# Patient Record
Sex: Female | Born: 1935 | Race: Black or African American | Hispanic: No | Smoking: Never smoker
Health system: Southern US, Community
[De-identification: ages and names within clinical notes are randomized; demographics above are authoritative.]

## PROBLEM LIST (undated history)

## (undated) DIAGNOSIS — M069 Rheumatoid arthritis, unspecified: Secondary | ICD-10-CM

## (undated) DIAGNOSIS — M1990 Unspecified osteoarthritis, unspecified site: Secondary | ICD-10-CM

## (undated) DIAGNOSIS — N183 Chronic kidney disease, stage 3 unspecified: Secondary | ICD-10-CM

## (undated) DIAGNOSIS — I428 Other cardiomyopathies: Secondary | ICD-10-CM

## (undated) DIAGNOSIS — L089 Local infection of the skin and subcutaneous tissue, unspecified: Secondary | ICD-10-CM

## (undated) DIAGNOSIS — I5042 Chronic combined systolic (congestive) and diastolic (congestive) heart failure: Secondary | ICD-10-CM

## (undated) DIAGNOSIS — E785 Hyperlipidemia, unspecified: Secondary | ICD-10-CM

## (undated) DIAGNOSIS — Z9289 Personal history of other medical treatment: Secondary | ICD-10-CM

## (undated) DIAGNOSIS — I639 Cerebral infarction, unspecified: Secondary | ICD-10-CM

## (undated) DIAGNOSIS — E119 Type 2 diabetes mellitus without complications: Secondary | ICD-10-CM

## (undated) DIAGNOSIS — I119 Hypertensive heart disease without heart failure: Secondary | ICD-10-CM

## (undated) DIAGNOSIS — I480 Paroxysmal atrial fibrillation: Secondary | ICD-10-CM

## (undated) DIAGNOSIS — J849 Interstitial pulmonary disease, unspecified: Secondary | ICD-10-CM

## (undated) DIAGNOSIS — E039 Hypothyroidism, unspecified: Secondary | ICD-10-CM

## (undated) DIAGNOSIS — T148XXA Other injury of unspecified body region, initial encounter: Secondary | ICD-10-CM

## (undated) DIAGNOSIS — Z8719 Personal history of other diseases of the digestive system: Secondary | ICD-10-CM

## (undated) DIAGNOSIS — I340 Nonrheumatic mitral (valve) insufficiency: Secondary | ICD-10-CM

## (undated) HISTORY — PX: APPENDECTOMY: SHX54

## (undated) HISTORY — DX: Hyperlipidemia, unspecified: E78.5

## (undated) HISTORY — PX: MEDIAL PARTIAL KNEE REPLACEMENT: SHX5965

## (undated) HISTORY — DX: Hypothyroidism, unspecified: E03.9

## (undated) HISTORY — DX: Personal history of other diseases of the digestive system: Z87.19

## (undated) HISTORY — PX: TUBAL LIGATION: SHX77

## (undated) HISTORY — DX: Unspecified osteoarthritis, unspecified site: M19.90

## (undated) HISTORY — PX: KNEE ARTHROSCOPY: SHX127

## (undated) HISTORY — DX: Hypertensive heart disease without heart failure: I11.9

## (undated) HISTORY — DX: Other cardiomyopathies: I42.8

## (undated) HISTORY — PX: PARTIAL COLECTOMY: SHX5273

## (undated) HISTORY — DX: Nonrheumatic mitral (valve) insufficiency: I34.0

## (undated) HISTORY — DX: Paroxysmal atrial fibrillation: I48.0

## (undated) HISTORY — DX: Local infection of the skin and subcutaneous tissue, unspecified: L08.9

## (undated) HISTORY — PX: CATARACT EXTRACTION W/ INTRAOCULAR LENS IMPLANT: SHX1309

## (undated) HISTORY — DX: Other injury of unspecified body region, initial encounter: T14.8XXA

## (undated) HISTORY — DX: Rheumatoid arthritis, unspecified: M06.9

## (undated) HISTORY — PX: TOTAL KNEE ARTHROPLASTY: SHX125

---

## 1998-04-15 ENCOUNTER — Other Ambulatory Visit: Admission: RE | Admit: 1998-04-15 | Discharge: 1998-04-15 | Payer: Self-pay | Admitting: Internal Medicine

## 1998-09-11 DIAGNOSIS — I639 Cerebral infarction, unspecified: Secondary | ICD-10-CM

## 1998-09-11 HISTORY — DX: Cerebral infarction, unspecified: I63.9

## 1999-07-14 ENCOUNTER — Ambulatory Visit (HOSPITAL_COMMUNITY): Admission: RE | Admit: 1999-07-14 | Discharge: 1999-07-14 | Payer: Self-pay | Admitting: Internal Medicine

## 1999-07-14 ENCOUNTER — Encounter: Payer: Self-pay | Admitting: Internal Medicine

## 1999-10-31 ENCOUNTER — Ambulatory Visit (HOSPITAL_COMMUNITY): Admission: RE | Admit: 1999-10-31 | Discharge: 1999-10-31 | Payer: Self-pay | Admitting: Cardiovascular Disease

## 1999-12-29 ENCOUNTER — Other Ambulatory Visit: Admission: RE | Admit: 1999-12-29 | Discharge: 1999-12-29 | Payer: Self-pay | Admitting: Internal Medicine

## 1999-12-30 ENCOUNTER — Ambulatory Visit (HOSPITAL_COMMUNITY): Admission: RE | Admit: 1999-12-30 | Discharge: 1999-12-30 | Payer: Self-pay | Admitting: Internal Medicine

## 2004-11-23 ENCOUNTER — Encounter: Admission: RE | Admit: 2004-11-23 | Discharge: 2004-11-23 | Payer: Self-pay | Admitting: Internal Medicine

## 2004-12-22 ENCOUNTER — Other Ambulatory Visit: Admission: RE | Admit: 2004-12-22 | Discharge: 2004-12-22 | Payer: Self-pay | Admitting: Internal Medicine

## 2004-12-23 ENCOUNTER — Inpatient Hospital Stay (HOSPITAL_COMMUNITY): Admission: AD | Admit: 2004-12-23 | Discharge: 2004-12-30 | Payer: Self-pay | Admitting: Internal Medicine

## 2005-01-31 ENCOUNTER — Inpatient Hospital Stay (HOSPITAL_COMMUNITY): Admission: RE | Admit: 2005-01-31 | Discharge: 2005-02-09 | Payer: Self-pay

## 2005-01-31 ENCOUNTER — Emergency Department (HOSPITAL_COMMUNITY): Admission: EM | Admit: 2005-01-31 | Discharge: 2005-01-31 | Payer: Self-pay | Admitting: *Deleted

## 2011-01-27 ENCOUNTER — Ambulatory Visit
Admission: RE | Admit: 2011-01-27 | Discharge: 2011-01-27 | Disposition: A | Discharge status: Home / Self Care | Payer: Medicare Other | Source: Ambulatory Visit | Attending: Internal Medicine | Admitting: Internal Medicine

## 2011-01-27 ENCOUNTER — Emergency Department (HOSPITAL_COMMUNITY): Payer: Medicare Other

## 2011-01-27 ENCOUNTER — Other Ambulatory Visit: Payer: Self-pay | Admitting: Internal Medicine

## 2011-01-27 ENCOUNTER — Emergency Department (HOSPITAL_COMMUNITY)
Admission: EM | Admit: 2011-01-27 | Discharge: 2011-01-27 | Disposition: A | Discharge status: Home / Self Care | Payer: Medicare Other | Attending: Emergency Medicine | Admitting: Emergency Medicine

## 2011-01-27 DIAGNOSIS — I10 Essential (primary) hypertension: Secondary | ICD-10-CM | POA: Insufficient documentation

## 2011-01-27 DIAGNOSIS — E119 Type 2 diabetes mellitus without complications: Secondary | ICD-10-CM | POA: Insufficient documentation

## 2011-01-27 DIAGNOSIS — N83209 Unspecified ovarian cyst, unspecified side: Secondary | ICD-10-CM | POA: Insufficient documentation

## 2011-01-27 DIAGNOSIS — E7800 Pure hypercholesterolemia, unspecified: Secondary | ICD-10-CM | POA: Insufficient documentation

## 2011-01-27 DIAGNOSIS — E039 Hypothyroidism, unspecified: Secondary | ICD-10-CM | POA: Insufficient documentation

## 2011-01-27 DIAGNOSIS — R319 Hematuria, unspecified: Secondary | ICD-10-CM | POA: Insufficient documentation

## 2011-01-27 DIAGNOSIS — R300 Dysuria: Secondary | ICD-10-CM | POA: Insufficient documentation

## 2011-01-27 DIAGNOSIS — N898 Other specified noninflammatory disorders of vagina: Secondary | ICD-10-CM | POA: Insufficient documentation

## 2011-01-27 LAB — URINALYSIS, ROUTINE W REFLEX MICROSCOPIC
Bilirubin Urine: NEGATIVE
Glucose, UA: NEGATIVE mg/dL
Ketones, ur: 15 mg/dL — AB
Leukocytes, UA: NEGATIVE
Specific Gravity, Urine: 1.023 (ref 1.005–1.030)
pH: 5 (ref 5.0–8.0)

## 2011-01-27 LAB — BASIC METABOLIC PANEL
BUN: 18 mg/dL (ref 6–23)
Chloride: 102 mEq/L (ref 96–112)
Glucose, Bld: 82 mg/dL (ref 70–99)
Potassium: 3.6 mEq/L (ref 3.5–5.1)
Sodium: 139 mEq/L (ref 135–145)

## 2011-01-27 LAB — CBC
HCT: 42.7 % (ref 36.0–46.0)
MCV: 99.1 fL (ref 78.0–100.0)
Platelets: 229 10*3/uL (ref 150–400)
RBC: 4.31 MIL/uL (ref 3.87–5.11)
WBC: 7.6 10*3/uL (ref 4.0–10.5)

## 2011-01-27 LAB — DIFFERENTIAL
Eosinophils Absolute: 0.2 10*3/uL (ref 0.0–0.7)
Lymphocytes Relative: 25 % (ref 12–46)
Lymphs Abs: 1.9 10*3/uL (ref 0.7–4.0)
Neutrophils Relative %: 62 % (ref 43–77)

## 2011-01-27 LAB — URINE MICROSCOPIC-ADD ON

## 2012-06-07 ENCOUNTER — Non-Acute Institutional Stay (HOSPITAL_COMMUNITY)
Admission: AD | Admit: 2012-06-07 | Discharge: 2012-06-07 | Disposition: A | Discharge status: Home / Self Care | Payer: Medicare Other | Source: Ambulatory Visit | Attending: Internal Medicine | Admitting: Internal Medicine

## 2012-06-07 DIAGNOSIS — D571 Sickle-cell disease without crisis: Secondary | ICD-10-CM | POA: Insufficient documentation

## 2012-06-07 LAB — COMPREHENSIVE METABOLIC PANEL
ALT: 14 U/L (ref 0–35)
BUN: 12 mg/dL (ref 6–23)
CO2: 27 mEq/L (ref 19–32)
Calcium: 9.9 mg/dL (ref 8.4–10.5)
Creatinine, Ser: 0.89 mg/dL (ref 0.50–1.10)
GFR calc Af Amer: 69 mL/min — ABNORMAL LOW (ref >90)
GFR calc non Af Amer: 60 mL/min — ABNORMAL LOW (ref >90)
Glucose, Bld: 73 mg/dL (ref 70–99)
Sodium: 139 mEq/L (ref 135–145)
Total Protein: 8.1 g/dL (ref 6.0–8.3)

## 2012-06-07 LAB — SEDIMENTATION RATE: Sed Rate: 50 mm/hr — ABNORMAL HIGH (ref 0–22)

## 2012-09-11 HISTORY — PX: INCISION AND DRAINAGE MOUTH: SUR676

## 2012-10-11 ENCOUNTER — Encounter (HOSPITAL_COMMUNITY): Payer: Self-pay

## 2012-10-11 DIAGNOSIS — I10 Essential (primary) hypertension: Secondary | ICD-10-CM | POA: Insufficient documentation

## 2012-10-11 DIAGNOSIS — E119 Type 2 diabetes mellitus without complications: Secondary | ICD-10-CM | POA: Insufficient documentation

## 2012-10-11 DIAGNOSIS — M79604 Pain in right leg: Secondary | ICD-10-CM | POA: Insufficient documentation

## 2012-10-11 DIAGNOSIS — L309 Dermatitis, unspecified: Secondary | ICD-10-CM | POA: Insufficient documentation

## 2012-10-11 DIAGNOSIS — L03115 Cellulitis of right lower limb: Secondary | ICD-10-CM | POA: Insufficient documentation

## 2015-04-26 ENCOUNTER — Ambulatory Visit
Admission: RE | Admit: 2015-04-26 | Discharge: 2015-04-26 | Disposition: A | Discharge status: Home / Self Care | Payer: Medicare Other | Source: Ambulatory Visit | Attending: Internal Medicine | Admitting: Internal Medicine

## 2015-04-26 ENCOUNTER — Other Ambulatory Visit: Payer: Self-pay | Admitting: Internal Medicine

## 2015-04-26 DIAGNOSIS — R0989 Other specified symptoms and signs involving the circulatory and respiratory systems: Secondary | ICD-10-CM

## 2015-11-22 ENCOUNTER — Inpatient Hospital Stay (HOSPITAL_COMMUNITY)
Admission: EM | Admit: 2015-11-22 | Discharge: 2015-11-30 | DRG: 286 | Disposition: A | Discharge status: Skilled Nursing Facility | Payer: Medicare Other | Attending: Internal Medicine | Admitting: Internal Medicine

## 2015-11-22 ENCOUNTER — Emergency Department (HOSPITAL_COMMUNITY): Payer: Medicare Other

## 2015-11-22 ENCOUNTER — Encounter (HOSPITAL_COMMUNITY): Payer: Self-pay | Admitting: Family Medicine

## 2015-11-22 DIAGNOSIS — M1990 Unspecified osteoarthritis, unspecified site: Secondary | ICD-10-CM | POA: Diagnosis present

## 2015-11-22 DIAGNOSIS — Z79899 Other long term (current) drug therapy: Secondary | ICD-10-CM

## 2015-11-22 DIAGNOSIS — I2510 Atherosclerotic heart disease of native coronary artery without angina pectoris: Secondary | ICD-10-CM | POA: Diagnosis not present

## 2015-11-22 DIAGNOSIS — R05 Cough: Secondary | ICD-10-CM

## 2015-11-22 DIAGNOSIS — I5021 Acute systolic (congestive) heart failure: Secondary | ICD-10-CM | POA: Diagnosis not present

## 2015-11-22 DIAGNOSIS — I272 Other secondary pulmonary hypertension: Secondary | ICD-10-CM | POA: Diagnosis present

## 2015-11-22 DIAGNOSIS — N183 Chronic kidney disease, stage 3 (moderate): Secondary | ICD-10-CM | POA: Diagnosis present

## 2015-11-22 DIAGNOSIS — E1122 Type 2 diabetes mellitus with diabetic chronic kidney disease: Secondary | ICD-10-CM | POA: Diagnosis present

## 2015-11-22 DIAGNOSIS — I480 Paroxysmal atrial fibrillation: Secondary | ICD-10-CM | POA: Diagnosis not present

## 2015-11-22 DIAGNOSIS — M549 Dorsalgia, unspecified: Secondary | ICD-10-CM | POA: Diagnosis present

## 2015-11-22 DIAGNOSIS — I447 Left bundle-branch block, unspecified: Secondary | ICD-10-CM | POA: Diagnosis present

## 2015-11-22 DIAGNOSIS — I5042 Chronic combined systolic (congestive) and diastolic (congestive) heart failure: Secondary | ICD-10-CM | POA: Insufficient documentation

## 2015-11-22 DIAGNOSIS — R059 Cough, unspecified: Secondary | ICD-10-CM

## 2015-11-22 DIAGNOSIS — R000 Tachycardia, unspecified: Secondary | ICD-10-CM | POA: Diagnosis not present

## 2015-11-22 DIAGNOSIS — I10 Essential (primary) hypertension: Secondary | ICD-10-CM | POA: Diagnosis not present

## 2015-11-22 DIAGNOSIS — M7989 Other specified soft tissue disorders: Secondary | ICD-10-CM | POA: Diagnosis present

## 2015-11-22 DIAGNOSIS — I509 Heart failure, unspecified: Secondary | ICD-10-CM

## 2015-11-22 DIAGNOSIS — I130 Hypertensive heart and chronic kidney disease with heart failure and stage 1 through stage 4 chronic kidney disease, or unspecified chronic kidney disease: Secondary | ICD-10-CM | POA: Diagnosis present

## 2015-11-22 DIAGNOSIS — E119 Type 2 diabetes mellitus without complications: Secondary | ICD-10-CM

## 2015-11-22 DIAGNOSIS — E785 Hyperlipidemia, unspecified: Secondary | ICD-10-CM | POA: Diagnosis present

## 2015-11-22 DIAGNOSIS — E039 Hypothyroidism, unspecified: Secondary | ICD-10-CM | POA: Diagnosis present

## 2015-11-22 DIAGNOSIS — T502X5A Adverse effect of carbonic-anhydrase inhibitors, benzothiadiazides and other diuretics, initial encounter: Secondary | ICD-10-CM | POA: Diagnosis present

## 2015-11-22 DIAGNOSIS — R609 Edema, unspecified: Secondary | ICD-10-CM

## 2015-11-22 DIAGNOSIS — E1165 Type 2 diabetes mellitus with hyperglycemia: Secondary | ICD-10-CM | POA: Diagnosis present

## 2015-11-22 DIAGNOSIS — I340 Nonrheumatic mitral (valve) insufficiency: Secondary | ICD-10-CM | POA: Diagnosis not present

## 2015-11-22 DIAGNOSIS — Z7984 Long term (current) use of oral hypoglycemic drugs: Secondary | ICD-10-CM

## 2015-11-22 DIAGNOSIS — I4891 Unspecified atrial fibrillation: Secondary | ICD-10-CM | POA: Insufficient documentation

## 2015-11-22 DIAGNOSIS — R7989 Other specified abnormal findings of blood chemistry: Secondary | ICD-10-CM | POA: Diagnosis not present

## 2015-11-22 DIAGNOSIS — E118 Type 2 diabetes mellitus with unspecified complications: Secondary | ICD-10-CM | POA: Diagnosis not present

## 2015-11-22 DIAGNOSIS — Z7982 Long term (current) use of aspirin: Secondary | ICD-10-CM

## 2015-11-22 DIAGNOSIS — Z96659 Presence of unspecified artificial knee joint: Secondary | ICD-10-CM | POA: Diagnosis present

## 2015-11-22 DIAGNOSIS — E871 Hypo-osmolality and hyponatremia: Secondary | ICD-10-CM | POA: Diagnosis not present

## 2015-11-22 DIAGNOSIS — R0602 Shortness of breath: Secondary | ICD-10-CM | POA: Diagnosis present

## 2015-11-22 DIAGNOSIS — R945 Abnormal results of liver function studies: Secondary | ICD-10-CM

## 2015-11-22 DIAGNOSIS — I420 Dilated cardiomyopathy: Secondary | ICD-10-CM | POA: Diagnosis present

## 2015-11-22 DIAGNOSIS — I5041 Acute combined systolic (congestive) and diastolic (congestive) heart failure: Secondary | ICD-10-CM | POA: Diagnosis present

## 2015-11-22 DIAGNOSIS — I071 Rheumatic tricuspid insufficiency: Secondary | ICD-10-CM | POA: Diagnosis not present

## 2015-11-22 DIAGNOSIS — R748 Abnormal levels of other serum enzymes: Secondary | ICD-10-CM | POA: Diagnosis present

## 2015-11-22 DIAGNOSIS — N179 Acute kidney failure, unspecified: Secondary | ICD-10-CM | POA: Diagnosis present

## 2015-11-22 DIAGNOSIS — M069 Rheumatoid arthritis, unspecified: Secondary | ICD-10-CM | POA: Diagnosis present

## 2015-11-22 DIAGNOSIS — I4892 Unspecified atrial flutter: Secondary | ICD-10-CM | POA: Diagnosis not present

## 2015-11-22 HISTORY — DX: Personal history of other medical treatment: Z92.89

## 2015-11-22 HISTORY — DX: Cerebral infarction, unspecified: I63.9

## 2015-11-22 HISTORY — DX: Type 2 diabetes mellitus without complications: E11.9

## 2015-11-22 HISTORY — DX: Unspecified osteoarthritis, unspecified site: M19.90

## 2015-11-22 LAB — URINALYSIS, ROUTINE W REFLEX MICROSCOPIC
Bilirubin Urine: NEGATIVE
Glucose, UA: NEGATIVE mg/dL
HGB URINE DIPSTICK: NEGATIVE
Ketones, ur: NEGATIVE mg/dL
LEUKOCYTES UA: NEGATIVE
NITRITE: NEGATIVE
PROTEIN: NEGATIVE mg/dL
SPECIFIC GRAVITY, URINE: 1.009 (ref 1.005–1.030)
pH: 5.5 (ref 5.0–8.0)

## 2015-11-22 LAB — CBC
HCT: 36.8 % (ref 36.0–46.0)
HEMOGLOBIN: 12 g/dL (ref 12.0–15.0)
MCH: 30.9 pg (ref 26.0–34.0)
MCHC: 32.6 g/dL (ref 30.0–36.0)
MCV: 94.8 fL (ref 78.0–100.0)
PLATELETS: 209 10*3/uL (ref 150–400)
RBC: 3.88 MIL/uL (ref 3.87–5.11)
RDW: 17.9 % — ABNORMAL HIGH (ref 11.5–15.5)
WBC: 7.8 10*3/uL (ref 4.0–10.5)

## 2015-11-22 LAB — COMPREHENSIVE METABOLIC PANEL
ALBUMIN: 3.5 g/dL (ref 3.5–5.0)
ALK PHOS: 252 U/L — AB (ref 38–126)
ALT: 273 U/L — ABNORMAL HIGH (ref 14–54)
ANION GAP: 14 (ref 5–15)
AST: 178 U/L — ABNORMAL HIGH (ref 15–41)
BUN: 29 mg/dL — ABNORMAL HIGH (ref 6–20)
CALCIUM: 9.6 mg/dL (ref 8.9–10.3)
CHLORIDE: 102 mmol/L (ref 101–111)
CO2: 21 mmol/L — AB (ref 22–32)
Creatinine, Ser: 1.63 mg/dL — ABNORMAL HIGH (ref 0.44–1.00)
GFR calc Af Amer: 33 mL/min — ABNORMAL LOW (ref >60)
GFR calc non Af Amer: 28 mL/min — ABNORMAL LOW (ref >60)
GLUCOSE: 208 mg/dL — AB (ref 65–99)
POTASSIUM: 4.9 mmol/L (ref 3.5–5.1)
SODIUM: 137 mmol/L (ref 135–145)
TOTAL PROTEIN: 6.5 g/dL (ref 6.5–8.1)
Total Bilirubin: 1.4 mg/dL — ABNORMAL HIGH (ref 0.3–1.2)

## 2015-11-22 LAB — BRAIN NATRIURETIC PEPTIDE: B NATRIURETIC PEPTIDE 5: 1732.2 pg/mL — AB (ref 0.0–100.0)

## 2015-11-22 MED ORDER — SODIUM CHLORIDE 0.9% FLUSH: 3.0000 mL | INTRAVENOUS | Status: DC | PRN

## 2015-11-22 MED ORDER — ACETAMINOPHEN 325 MG PO TABS
650.0000 mg | ORAL_TABLET | ORAL | Status: DC | PRN
Start: 2015-11-22 — End: 2015-11-30
  Administered 2015-11-24 – 2015-11-29 (×6): 650 mg via ORAL
  Filled 2015-11-22 (×6): qty 2

## 2015-11-22 MED ORDER — BENZONATATE 100 MG PO CAPS
200.0000 mg | ORAL_CAPSULE | Freq: Three times a day (TID) | ORAL | Status: DC | PRN
  Administered 2015-11-23 – 2015-11-27 (×7): 200 mg via ORAL
  Filled 2015-11-22 (×7): qty 2

## 2015-11-22 MED ORDER — FOLIC ACID 1 MG PO TABS
1.0000 mg | ORAL_TABLET | Freq: Every day | ORAL | Status: DC
  Administered 2015-11-23 – 2015-11-30 (×8): 1 mg via ORAL
  Filled 2015-11-22 (×8): qty 1

## 2015-11-22 MED ORDER — SODIUM CHLORIDE 0.9 % IV SOLN: 250.0000 mL | INTRAVENOUS | Status: DC | PRN

## 2015-11-22 MED ORDER — IPRATROPIUM-ALBUTEROL 0.5-2.5 (3) MG/3ML IN SOLN: 3.0000 mL | RESPIRATORY_TRACT | Status: DC | PRN

## 2015-11-22 MED ORDER — FUROSEMIDE 10 MG/ML IJ SOLN
40.0000 mg | Freq: Once | INTRAMUSCULAR | Status: AC
  Administered 2015-11-23: 40 mg via INTRAVENOUS
  Filled 2015-11-22: qty 4

## 2015-11-22 MED ORDER — HYDROXYCHLOROQUINE SULFATE 200 MG PO TABS
200.0000 mg | ORAL_TABLET | Freq: Two times a day (BID) | ORAL | Status: DC
  Administered 2015-11-23 – 2015-11-30 (×16): 200 mg via ORAL
  Filled 2015-11-22 (×17): qty 1

## 2015-11-22 MED ORDER — LEVOTHYROXINE SODIUM 75 MCG PO TABS
75.0000 ug | ORAL_TABLET | Freq: Every day | ORAL | Status: DC
  Administered 2015-11-23 – 2015-11-30 (×8): 75 ug via ORAL
  Filled 2015-11-22 (×8): qty 1

## 2015-11-22 MED ORDER — DICLOFENAC SODIUM 1 % TD GEL
4.0000 g | Freq: Four times a day (QID) | TRANSDERMAL | Status: DC
  Administered 2015-11-23 – 2015-11-30 (×10): 4 g via TOPICAL
  Filled 2015-11-22: qty 100

## 2015-11-22 MED ORDER — ASPIRIN 81 MG PO CHEW
81.0000 mg | CHEWABLE_TABLET | Freq: Every day | ORAL | Status: DC
  Administered 2015-11-23 – 2015-11-26 (×4): 81 mg via ORAL
  Filled 2015-11-22 (×4): qty 1

## 2015-11-22 MED ORDER — ONDANSETRON HCL 4 MG/2ML IJ SOLN
4.0000 mg | Freq: Four times a day (QID) | INTRAMUSCULAR | Status: DC | PRN
  Administered 2015-11-25 – 2015-11-29 (×4): 4 mg via INTRAVENOUS
  Filled 2015-11-22 (×4): qty 2

## 2015-11-22 MED ORDER — INSULIN ASPART 100 UNIT/ML ~~LOC~~ SOLN
0.0000 [IU] | Freq: Three times a day (TID) | SUBCUTANEOUS | Status: DC
  Administered 2015-11-23 – 2015-11-28 (×5): 1 [IU] via SUBCUTANEOUS

## 2015-11-22 MED ORDER — SODIUM CHLORIDE 0.9% FLUSH
3.0000 mL | Freq: Two times a day (BID) | INTRAVENOUS | Status: DC
  Administered 2015-11-23 – 2015-11-27 (×6): 3 mL via INTRAVENOUS

## 2015-11-22 MED ORDER — LATANOPROST 0.005 % OP SOLN
1.0000 [drp] | Freq: Every day | OPHTHALMIC | Status: DC
  Administered 2015-11-23 – 2015-11-29 (×7): 1 [drp] via OPHTHALMIC
  Filled 2015-11-22: qty 2.5

## 2015-11-22 MED ORDER — ENOXAPARIN SODIUM 30 MG/0.3ML ~~LOC~~ SOLN
30.0000 mg | SUBCUTANEOUS | Status: DC
  Administered 2015-11-23: 30 mg via SUBCUTANEOUS
  Filled 2015-11-22: qty 0.3

## 2015-11-22 MED ORDER — INSULIN ASPART 100 UNIT/ML ~~LOC~~ SOLN: 0.0000 [IU] | Freq: Every day | SUBCUTANEOUS | Status: DC

## 2015-11-22 NOTE — ED Notes (Signed)
Patient states she has noticed her feet swelling since last week.  2-3+ edema.  States she has been having pain in her lower back.  Has been diagnosed with ?pneumonia (cough) or "some kind of infection".

## 2015-11-22 NOTE — ED Provider Notes (Signed)
CSN: [id number]     Arrival date & time 11/22/15  1445 History   First MD Initiated Contact with Patient 11/22/15 2027     Chief Complaint  Patient presents with  . Back Pain  . Cough     (Consider location/radiation/quality/duration/timing/severity/associated sxs/prior Treatment) [name] presenting with back pain and cough. The history is provided by the patient and a relative (daughter).  Back Pain Location:  Thoracic spine (right paraspinal) Quality: sharp. Radiates to:  Does not radiate Pain severity:  Moderate Pain is:  Worse during the day Onset quality:  Gradual Duration:  3 weeks Timing:  Intermittent Progression:  Worsening Chronicity:  New Context comment:  Patient with cough for same period of time, states cough causes back pain to be worse Relieved by:  Nothing Exacerbated by: coughing. Associated symptoms: no abdominal pain, no bladder incontinence, no chest pain, no dysuria, no fever, no headaches and no weakness   Associated symptoms comment:  Coughing, bilateral leg swelling Cough Associated symptoms: no chest pain, no chills, no diaphoresis, no fever, no headaches, no myalgias, no rash, no rhinorrhea, no shortness of breath, no sore throat and no wheezing     Past Medical History  Diagnosis Date  . Hypertension   . Diabetes mellitus without complication (HCC)   . Rheumatoid arthritis(714.0)   . DJD (degenerative joint disease)   . Wound infection (HCC)     Right knee   . Hypothyroidism   . History of diverticulosis   . Hyperlipidemia    Past Surgical History  Procedure Laterality Date  . Knee surgery      arthroscopic  . Joint replacement  08/07/2011    knee  . Partial colectomy      descending colon by Dr. Orson Slick   History reviewed. No pertinent family history. Social History  Substance Use Topics  . Smoking status: Never Smoker   . Smokeless tobacco: None  . Alcohol Use: No   OB History    No data available      Review of Systems  Constitutional: Negative for fever, chills, diaphoresis, activity change, appetite change and fatigue.  HENT: Negative for facial swelling, rhinorrhea, sore throat, trouble swallowing and voice change.   Eyes: Negative for photophobia, pain and visual disturbance.  Respiratory: Positive for cough. Negative for shortness of breath, wheezing and stridor.   Cardiovascular: Positive for leg swelling. Negative for chest pain and palpitations.  Gastrointestinal: Negative for nausea, vomiting, abdominal pain, constipation and anal bleeding.  Endocrine: Negative.   Genitourinary: Negative for bladder incontinence, dysuria, vaginal bleeding, vaginal discharge and vaginal pain.  Musculoskeletal: Positive for back pain. Negative for myalgias and arthralgias.  Skin: Negative.  Negative for rash.  Allergic/Immunologic: Negative.   Neurological: Negative for dizziness, tremors, syncope, weakness and headaches.  Psychiatric/Behavioral: Negative for suicidal ideas, sleep disturbance and self-injury.  All other systems reviewed and are negative.     Allergies  Penicillins  Home Medications   Prior to Admission medications   Medication Sig Start Date End Date Taking? Authorizing Provider  aspirin 81 MG tablet Take 81 mg by mouth daily.   Yes Historical Provider, MD  atorvastatin (LIPITOR) 10 MG tablet Take 10 mg by mouth daily.   Yes Historical Provider, MD  benzonatate (TESSALON) 200 MG capsule Take 200 mg by mouth every 8 (eight) hours as needed for cough.   Yes Historical Provider, MD  diclofenac sodium (VOLTAREN) 1 % GEL Apply 4 g topically 4 (four) times daily.  Yes Historical Provider, MD  doxycycline (VIBRA-TABS) 100 MG tablet Take 100 mg by mouth 2 (two) times daily.   Yes Historical Provider, MD  folic acid (FOLVITE) 1 MG tablet Take 1 mg by mouth daily.   Yes Historical Provider, MD  glimepiride (AMARYL) 2 MG tablet Take 2-4 mg by mouth daily before breakfast. Take 4  mg every morning  Take 2 mg every evening   Yes Historical Provider, MD  hydroxychloroquine (PLAQUENIL) 200 MG tablet Take 200 mg by mouth 2 (two) times daily.   Yes Historical Provider, MD  latanoprost (XALATAN) 0.005 % ophthalmic solution Place 1 drop into both eyes at bedtime.   Yes Historical Provider, MD  levothyroxine (SYNTHROID, LEVOTHROID) 50 MCG tablet Take 75 mcg by mouth daily.    Yes Historical Provider, MD  methotrexate (RHEUMATREX) 2.5 MG tablet Take 25 mg by mouth once a week. Caution:Chemotherapy. Protect from light. On Friday   Yes Historical Provider, MD  predniSONE (DELTASONE) 20 MG tablet Take 20 mg by mouth See admin instructions. Take 60 mg daily x3 days Take 40 mg daily x3 days Take 20 mg daily x3 days   Yes Historical Provider, MD  sitaGLIPtin (JANUVIA) 50 MG tablet Take 50 mg by mouth daily.   Yes Historical Provider, MD  valsartan (DIOVAN) 80 MG tablet Take 80 mg by mouth daily.   Yes Historical Provider, MD   BP 118/76 mmHg  Pulse 88  Temp(Src) 97.4 F (36.3 C) (Oral)  Resp 27  Ht 5\' 6"  (1.676 m)  Wt 84.369 kg  BMI 30.04 kg/m2  SpO2 97% Physical Exam  Constitutional: She is oriented to person, place, and time. She appears well-developed and well-nourished. No distress.  HENT:  Head: Normocephalic and atraumatic.  Right Ear: External ear normal.  Left Ear: External ear normal.  Mouth/Throat: Oropharynx is clear and moist. No oropharyngeal exudate.  Eyes: Conjunctivae and EOM are normal. Pupils are equal, round, and reactive to light. No scleral icterus.  Neck: Normal range of motion. Neck supple. No JVD present. No tracheal deviation present. No thyromegaly present.  Cardiovascular: Normal rate, regular rhythm and intact distal pulses.  Exam reveals no friction rub.   Pulmonary/Chest: Effort normal. No respiratory distress. She has no wheezes. She has rales (in bilateral lung bases).  Abdominal: Soft. Bowel sounds are normal. She exhibits no distension.  There is no tenderness.  Musculoskeletal: Normal range of motion. She exhibits edema (2+ pitting edema in BLE). She exhibits no tenderness.  Neurological: She is alert and oriented to person, place, and time. No cranial nerve deficit. She exhibits normal muscle tone. Coordination normal.  Skin: Skin is warm and dry. She is not diaphoretic. No pallor.  Psychiatric: She has a normal mood and affect. She expresses no homicidal and no suicidal ideation. She expresses no suicidal plans and no homicidal plans.  Nursing note and vitals reviewed.   ED Course  Procedures (including critical care time) Labs Review Labs Reviewed  COMPREHENSIVE METABOLIC PANEL - Abnormal; Notable for the following:    CO2 21 (*)    Glucose, Bld 208 (*)    BUN 29 (*)    Creatinine, Ser 1.63 (*)    AST 178 (*)    ALT 273 (*)    Alkaline Phosphatase 252 (*)    Total Bilirubin 1.4 (*)    GFR calc non Af Amer 28 (*)    GFR calc Af Amer 33 (*)    All other components within normal limits  CBC -  Abnormal; Notable for the following:    RDW 17.9 (*)    All other components within normal limits  BRAIN NATRIURETIC PEPTIDE - Abnormal; Notable for the following:    B Natriuretic Peptide 1732.2 (*)    All other components within normal limits  URINALYSIS, ROUTINE W REFLEX MICROSCOPIC (NOT AT [facility])  D-DIMER, QUANTITATIVE (NOT AT [facility])  TROPONIN I    Imaging Review Dg Chest 2 View  11/22/2015  CLINICAL DATA:  Cough for 2 day EXAM: CHEST  2 VIEW COMPARISON:  04/26/2015 FINDINGS: The heart is moderately enlarged. Lungs are hyperaerated. Pulmonary vascular is within normal limits. Tiny pleural effusions and bibasilar atelectasis. No pneumothorax. IMPRESSION: Cardiomegaly without decompensation. Tiny pleural effusions and bibasilar atelectasis. Electronically Signed   By: Jolaine Click M.D.   On: 11/22/2015 18:02   I have personally reviewed and evaluated these images and lab results as part of my medical decision-making.    EKG Interpretation   Date/Time:  Monday November 22 2015 21:54:27 EDT Ventricular Rate:  89 PR Interval:  176 QRS Duration: 138 QT Interval:  401 QTC Calculation: 488 R Axis:   113 Text Interpretation:  Sinus rhythm Nonspecific intraventricular conduction  delay Non-specific ST-t changes Confirmed by Denton Lank  MD, Caryn Bee ([id number]) on  11/22/2015 10:17:16 PM      MDM   Final diagnoses:  Cough    The patient is a 82 year old female with a history of hypertension, diabetes, hyperlipidemia, and rheumatoid arthritis who presents for 3 weeks of cough and back pain. Patient is afebrile and hemodynamically stable. She is noted to have crackles in bilateral 1 bases and pitting edema bilaterally. Her ENT is elevated over 1732 with no previous baseline to compare to. Patients EF is unknown. Non specific ST changes on EKG but the patient denies any chest pain. Do not suspect acute ACS at this time. The patient has new heart failure would benefit from workup and diuresis. Patient was given IV Lasix and admitted to the hospitalist service for further management. Patient and daughter express understanding and agreement with this plan.  Patient seen with attending, Dr. Denton Lank, who oversaw clinical decision making.    Lula Olszewski, MD 11/22/15 2671  Cathren Laine, MD 11/23/15 [phone]

## 2015-11-22 NOTE — H&P (Signed)
[facility] History and Physical  PORTIA MATKIN IOX:[id number] DOB: 1930-03-10 DOA: 11/22/2015  Referring physician: ED PCP: Gwenyth Bender, MD   Chief Complaint: Back pain and cough  HPI:  [name] is a 82 year old female with a past medical history significant for HTN, diabetes mellitus type 2 without complication, rheumatoid arthritis, hypothyroidism; who presents with complaints of progressively worsening back pain and cough. Symptoms started approximately 3 weeks ago and have been gradually worsening. Patient reported being evaluated on 3-4 separate occasions since the onset of symptoms. Initially she went  saw her primary care provider because of pain in the middle of her back and was told if symptoms persisted that they would check x-ray. She was given Jerilynn Som and a unknown antibiotic at that time. Symptoms persisted, but no x-ray the least of her back was ever checked. The cough persisted and was keeping her up at night. The next time she was evaluated at urgent care and diagnosed with upper respiratory infection and given doxycycline. At some point within the last week the patient was evaluated with ultrasound of her lower extremities as she reported worsening bilateral lower extremity swelling.  Subsequently, she was evaluated 4 days ago and she was given prednisone as she was found to be wheezing and possibly given a third  round of antibiotics. Patient denies having any fever, chills, abdominal pain, nausea, vomiting, or diarrhea. Associated symptoms include generalized malaise and difficulty with ambulating due to bilateral lower extremity swelling. Patient denies ever having leg swelling in the past. Also notes that he recently prior to being started on steroids she was having issues with her blood glucose being low in the morning reporting CBGs of 80.  Upon admission patient's evaluated and found to have elevated BNP of 1732.2, chest x-ray showing cardiomegaly with tiny  pleural effusion. She was admitted for signs of acute cardiomegaly   Review of Systems  Constitutional: Positive for malaise/fatigue.  HENT: Negative for ear discharge and ear pain.   Eyes: Negative for photophobia and pain.  Respiratory: Positive for cough. Negative for hemoptysis and shortness of breath.   Cardiovascular: Positive for leg swelling. Negative for chest pain.  Gastrointestinal: Negative for nausea, vomiting and abdominal pain.  Genitourinary: Negative for urgency and frequency.  Musculoskeletal: Positive for back pain and joint pain.  Skin: Negative for itching and rash.  Neurological: Positive for weakness. Negative for focal weakness, seizures and loss of consciousness.  Endo/Heme/Allergies: Negative for environmental allergies. Does not bruise/bleed easily.  Psychiatric/Behavioral: Negative for hallucinations and substance abuse.      Past Medical History  Diagnosis Date  . Hypertension   . Diabetes mellitus without complication (HCC)   . Rheumatoid arthritis(714.0)   . DJD (degenerative joint disease)   . Wound infection (HCC)     Right knee   . Hypothyroidism   . History of diverticulosis   . Hyperlipidemia      Past Surgical History  Procedure Laterality Date  . Knee surgery      arthroscopic  . Joint replacement  08/07/2011    knee  . Partial colectomy      descending colon by Dr. Orson Slick      Social History:  reports that she has never smoked. She does not have any smokeless tobacco history on file. She reports that she does not drink alcohol. Her drug history is not on file. Where does patient live--home   and with whom if at home? Daughter Can patient participate in ADLs? Yes  Allergies  Allergen Reactions  . Penicillins     History reviewed. No pertinent family history.      Prior to Admission medications   Medication Sig Start Date End Date Taking? Authorizing Provider  aspirin 81 MG tablet Take 81 mg by mouth daily.   Yes  Historical Provider, MD  atorvastatin (LIPITOR) 10 MG tablet Take 10 mg by mouth daily.   Yes Historical Provider, MD  benzonatate (TESSALON) 200 MG capsule Take 200 mg by mouth every 8 (eight) hours as needed for cough.   Yes Historical Provider, MD  diclofenac sodium (VOLTAREN) 1 % GEL Apply 4 g topically 4 (four) times daily.   Yes Historical Provider, MD  doxycycline (VIBRA-TABS) 100 MG tablet Take 100 mg by mouth 2 (two) times daily.   Yes Historical Provider, MD  folic acid (FOLVITE) 1 MG tablet Take 1 mg by mouth daily.   Yes Historical Provider, MD  glimepiride (AMARYL) 2 MG tablet Take 2-4 mg by mouth daily before breakfast. Take 4 mg every morning  Take 2 mg every evening   Yes Historical Provider, MD  hydroxychloroquine (PLAQUENIL) 200 MG tablet Take 200 mg by mouth 2 (two) times daily.   Yes Historical Provider, MD  latanoprost (XALATAN) 0.005 % ophthalmic solution Place 1 drop into both eyes at bedtime.   Yes Historical Provider, MD  levothyroxine (SYNTHROID, LEVOTHROID) 50 MCG tablet Take 75 mcg by mouth daily.    Yes Historical Provider, MD  methotrexate (RHEUMATREX) 2.5 MG tablet Take 25 mg by mouth once a week. Caution:Chemotherapy. Protect from light. On Friday   Yes Historical Provider, MD  predniSONE (DELTASONE) 20 MG tablet Take 20 mg by mouth See admin instructions. Take 60 mg daily x3 days Take 40 mg daily x3 days Take 20 mg daily x3 days   Yes Historical Provider, MD  sitaGLIPtin (JANUVIA) 50 MG tablet Take 50 mg by mouth daily.   Yes Historical Provider, MD  valsartan (DIOVAN) 80 MG tablet Take 80 mg by mouth daily.   Yes Historical Provider, MD     Physical Exam: Filed Vitals:   11/22/15 2115 11/22/15 2145 11/22/15 2200 11/22/15 2215  BP: 122/80 122/81 123/78 118/76  Pulse: 104 92 66 88  Temp:      TempSrc:      Resp:   27   Height:      Weight:      SpO2: 100% 96% 100% 97%     Constitutional: Vital signs reviewed. Patient is a well-developed and  well-nourished in no acute distress and cooperative with exam. Alert and oriented x3.  Head: Normocephalic and atraumatic  Ear: TM normal bilaterally  Mouth: no erythema or exudates, MMM  Eyes: PERRL, EOMI, conjunctivae normal, No scleral icterus.  Neck: Supple, Trachea midline normal ROM, No JVD, mass, thyromegaly, or carotid bruit present.  Cardiovascular: RRR, S1 normal, S2 normal, no MRG, pulses symmetric and intact bilaterally  Pulmonary/Chest: Bibasilar crackles appreciated, mildly tachypneic, but able to speak in full sentences. Abdominal: Soft. Non-tender, non-distended, bowel sounds are normal, no masses, organomegaly, or guarding present.  GU: no CVA tenderness Musculoskeletal: No joint deformities, erythema, or stiffness, ROM full and no nontender Ext: +2 pitting edema up to the knee and no cyanosis, pulses palpable bilaterally (DP and PT)  Hematology: no cervical, inginal, or axillary adenopathy.  Neurological: A&O x3, Strenght is normal and symmetric bilaterally, cranial nerve II-XII are grossly intact, no focal motor deficit, sensory intact to light touch bilaterally.  Skin: Warm, dry  and intact. No rash, cyanosis, or clubbing.  Psychiatric: Normal mood and affect. speech and behavior is normal. Judgment and thought content normal. Cognition and memory are normal.      Data Review   Micro Results No results found for this or any previous visit (from the past 240 hour(s)).  Radiology Reports Dg Chest 2 View  11/22/2015  CLINICAL DATA:  Cough for 2 day EXAM: CHEST  2 VIEW COMPARISON:  04/26/2015 FINDINGS: The heart is moderately enlarged. Lungs are hyperaerated. Pulmonary vascular is within normal limits. Tiny pleural effusions and bibasilar atelectasis. No pneumothorax. IMPRESSION: Cardiomegaly without decompensation. Tiny pleural effusions and bibasilar atelectasis. Electronically Signed   By: Jolaine Click M.D.   On: 11/22/2015 18:02     CBC  Recent Labs Lab  11/22/15 1659  WBC 7.8  HGB 12.0  HCT 36.8  PLT 209  MCV 94.8  MCH 30.9  MCHC 32.6  RDW 17.9*    Chemistries   Recent Labs Lab 11/22/15 1659  NA 137  K 4.9  CL 102  CO2 21*  GLUCOSE 208*  BUN 29*  CREATININE 1.63*  CALCIUM 9.6  AST 178*  ALT 273*  ALKPHOS 252*  BILITOT 1.4*   ------------------------------------------------------------------------------------------------------------------ estimated creatinine clearance is 28.6 mL/min (by C-G formula based on Cr of 1.63). ------------------------------------------------------------------------------------------------------------------ No results for input(s): HGBA1C in the last 72 hours. ------------------------------------------------------------------------------------------------------------------ No results for input(s): CHOL, HDL, LDLCALC, TRIG, CHOLHDL, LDLDIRECT in the last 72 hours. ------------------------------------------------------------------------------------------------------------------ No results for input(s): TSH, T4TOTAL, T3FREE, THYROIDAB in the last 72 hours.  Invalid input(s): FREET3 ------------------------------------------------------------------------------------------------------------------ No results for input(s): VITAMINB12, FOLATE, FERRITIN, TIBC, IRON, RETICCTPCT in the last 72 hours.  Coagulation profile No results for input(s): INR, PROTIME in the last 168 hours.  No results for input(s): DDIMER in the last 72 hours.  Cardiac Enzymes No results for input(s): CKMB, TROPONINI, MYOGLOBIN in the last 168 hours.  Invalid input(s): CK ------------------------------------------------------------------------------------------------------------------ Invalid input(s): POCBNP   CBG: No results for input(s): GLUCAP in the last 168 hours.     EKG: Independently reviewedSinus rhythm with nonspecific intraventricular conduction delay and ST changes.   Assessment/Plan Congestive  heart failure:  acute. New onset the patient reports 3 week history of progressively worsening cough and bilateral lower extremity edema. Physical exam reveals 2+ pitting edema bilateral lower extremities with bilateral crackles in the lower lung fields. Chest x-ray showing cardiomegaly without decompensation and small pleural effusion. BNP was found to be elevated at 1732.2. Patient was given 40 mg of Lasix in the ED - Admit to telemetry bed - Strict I&O and daily weights - Continue Lasix 40 mg every 12 hours as blood pressure will allow, may need to reduce to 20 mg every 12 hours if patient blood pressure will not tolerate - Check the lipid panel - consult to cardiology  Possible Acute kidney injury on chronic kidney disease stage III: Patient's baseline creatinine previously was noted to be 1.08 approximately 4 years ago. No other recent values to compare. - Recheck BMP in a.m. - May need to investigate further with the renal ultrasound, but may be secondary to acute exacerbation of CHF and poor flow   Hyperglycemia with diabetes mellitus type 2 :  patient appears to have been previously controlled with oral medications. Patient was recently started on steroids as previously thought to have a bronchitis. - Check hemoglobin A1c - Hold home Amaryl  and Januvia - CBGs every before meals and at bedtime  Hypertension: Patient's blood pressure is low  normal on admission - Held Diovan during acute diuresis and with possible acute kidney injury   Hypothyroidism -Continue levothyroxine   Rheumatoid arthritis  - Continue Plaquenil  - Held  methotrexate with elevated liver enzymes, but suspected secondary to passive congestion from heart failure  Hyperlipidemia: - Held Lipitor  Code Status:   full Family Communication: bedside Disposition Plan: admit   Total time spent 55 minutes.Greater than 50% of this time was spent in counseling, explanation of diagnosis, planning of further management,  and coordination of care  Clydie Braun [facility] Pager [phone]  If 7PM-7AM, please contact night-coverage [email or web address] Password [facility] 11/22/2015, 11:12 PM

## 2015-11-22 NOTE — ED Notes (Signed)
Pt here for cough, upper back pain. Sent here r/o PNA and PE. sts also feet swelling.

## 2015-11-23 ENCOUNTER — Encounter (HOSPITAL_COMMUNITY): Payer: Self-pay | Admitting: Cardiology

## 2015-11-23 ENCOUNTER — Inpatient Hospital Stay (HOSPITAL_COMMUNITY): Payer: Medicare Other

## 2015-11-23 ENCOUNTER — Ambulatory Visit (HOSPITAL_COMMUNITY): Payer: Medicare Other

## 2015-11-23 DIAGNOSIS — I447 Left bundle-branch block, unspecified: Secondary | ICD-10-CM

## 2015-11-23 DIAGNOSIS — E118 Type 2 diabetes mellitus with unspecified complications: Secondary | ICD-10-CM

## 2015-11-23 DIAGNOSIS — R609 Edema, unspecified: Secondary | ICD-10-CM

## 2015-11-23 DIAGNOSIS — N179 Acute kidney failure, unspecified: Secondary | ICD-10-CM

## 2015-11-23 DIAGNOSIS — I509 Heart failure, unspecified: Secondary | ICD-10-CM

## 2015-11-23 DIAGNOSIS — I10 Essential (primary) hypertension: Secondary | ICD-10-CM

## 2015-11-23 LAB — COMPREHENSIVE METABOLIC PANEL
ALBUMIN: 3.3 g/dL — AB (ref 3.5–5.0)
ALT: 257 U/L — ABNORMAL HIGH (ref 14–54)
ANION GAP: 12 (ref 5–15)
AST: 133 U/L — ABNORMAL HIGH (ref 15–41)
Alkaline Phosphatase: 231 U/L — ABNORMAL HIGH (ref 38–126)
BUN: 27 mg/dL — ABNORMAL HIGH (ref 6–20)
CO2: 24 mmol/L (ref 22–32)
Calcium: 9.3 mg/dL (ref 8.9–10.3)
Chloride: 101 mmol/L (ref 101–111)
Creatinine, Ser: 1.65 mg/dL — ABNORMAL HIGH (ref 0.44–1.00)
GFR calc Af Amer: 32 mL/min — ABNORMAL LOW (ref >60)
GFR calc non Af Amer: 28 mL/min — ABNORMAL LOW (ref >60)
GLUCOSE: 205 mg/dL — AB (ref 65–99)
POTASSIUM: 4.6 mmol/L (ref 3.5–5.1)
SODIUM: 137 mmol/L (ref 135–145)
TOTAL PROTEIN: 5.9 g/dL — AB (ref 6.5–8.1)
Total Bilirubin: 0.9 mg/dL (ref 0.3–1.2)

## 2015-11-23 LAB — CBC WITH DIFFERENTIAL/PLATELET
BASOS PCT: 0 %
Basophils Absolute: 0 10*3/uL (ref 0.0–0.1)
EOS ABS: 0 10*3/uL (ref 0.0–0.7)
Eosinophils Relative: 0 %
HCT: 34.1 % — ABNORMAL LOW (ref 36.0–46.0)
Hemoglobin: 11 g/dL — ABNORMAL LOW (ref 12.0–15.0)
Lymphocytes Relative: 20 %
Lymphs Abs: 1.6 10*3/uL (ref 0.7–4.0)
MCH: 30.5 pg (ref 26.0–34.0)
MCHC: 32.3 g/dL (ref 30.0–36.0)
MCV: 94.5 fL (ref 78.0–100.0)
MONO ABS: 1.1 10*3/uL — AB (ref 0.1–1.0)
MONOS PCT: 14 %
Neutro Abs: 5.3 10*3/uL (ref 1.7–7.7)
Neutrophils Relative %: 66 %
PLATELETS: 217 10*3/uL (ref 150–400)
RBC: 3.61 MIL/uL — ABNORMAL LOW (ref 3.87–5.11)
RDW: 18.1 % — AB (ref 11.5–15.5)
WBC: 8.1 10*3/uL (ref 4.0–10.5)

## 2015-11-23 LAB — GLUCOSE, CAPILLARY
GLUCOSE-CAPILLARY: 100 mg/dL — AB (ref 65–99)
GLUCOSE-CAPILLARY: 69 mg/dL (ref 65–99)
GLUCOSE-CAPILLARY: 142 mg/dL — AB (ref 65–99)
GLUCOSE-CAPILLARY: 184 mg/dL — AB (ref 65–99)
Glucose-Capillary: 175 mg/dL — ABNORMAL HIGH (ref 65–99)
Glucose-Capillary: 51 mg/dL — ABNORMAL LOW (ref 65–99)

## 2015-11-23 LAB — TSH: TSH: 1.242 u[IU]/mL (ref 0.350–4.500)

## 2015-11-23 LAB — TROPONIN I

## 2015-11-23 LAB — D-DIMER, QUANTITATIVE: D-Dimer, Quant: 2.19 ug/mL-FEU — ABNORMAL HIGH (ref 0.00–0.50)

## 2015-11-23 MED ORDER — HEPARIN (PORCINE) IN NACL 100-0.45 UNIT/ML-% IJ SOLN
900.0000 [IU]/h | INTRAMUSCULAR | Status: DC
  Administered 2015-11-23: 1000 [IU]/h via INTRAVENOUS
  Filled 2015-11-23: qty 250

## 2015-11-23 MED ORDER — TECHNETIUM TC 99M DIETHYLENETRIAME-PENTAACETIC ACID: 32.0000 | Freq: Once | INTRAVENOUS | Status: DC | PRN

## 2015-11-23 MED ORDER — IPRATROPIUM-ALBUTEROL 0.5-2.5 (3) MG/3ML IN SOLN
3.0000 mL | Freq: Two times a day (BID) | RESPIRATORY_TRACT | Status: DC
  Administered 2015-11-23 – 2015-11-30 (×12): 3 mL via RESPIRATORY_TRACT
  Filled 2015-11-23 (×14): qty 3

## 2015-11-23 MED ORDER — LORATADINE 10 MG PO TABS
10.0000 mg | ORAL_TABLET | Freq: Every day | ORAL | Status: DC
  Administered 2015-11-23 – 2015-11-30 (×8): 10 mg via ORAL
  Filled 2015-11-23 (×8): qty 1

## 2015-11-23 MED ORDER — TECHNETIUM TO 99M ALBUMIN AGGREGATED
4.0000 | Freq: Once | INTRAVENOUS | Status: AC | PRN
  Administered 2015-11-23: 4 via INTRAVENOUS

## 2015-11-23 MED ORDER — DILTIAZEM HCL 100 MG IV SOLR
5.0000 mg/h | INTRAVENOUS | Status: DC
  Administered 2015-11-23 – 2015-11-24 (×2): 5 mg/h via INTRAVENOUS
  Filled 2015-11-23 (×2): qty 100

## 2015-11-23 MED ORDER — DILTIAZEM LOAD VIA INFUSION
15.0000 mg | Freq: Once | INTRAVENOUS | Status: AC
Start: 2015-11-23 — End: 2015-11-23
  Administered 2015-11-23: 15 mg via INTRAVENOUS
  Filled 2015-11-23: qty 15

## 2015-11-23 MED ORDER — GUAIFENESIN ER 600 MG PO TB12
1200.0000 mg | ORAL_TABLET | Freq: Two times a day (BID) | ORAL | Status: DC
  Administered 2015-11-23 – 2015-11-30 (×15): 1200 mg via ORAL
  Filled 2015-11-23 (×16): qty 2

## 2015-11-23 MED ORDER — FUROSEMIDE 10 MG/ML IJ SOLN
40.0000 mg | Freq: Two times a day (BID) | INTRAMUSCULAR | Status: DC
  Administered 2015-11-24: 40 mg via INTRAVENOUS
  Filled 2015-11-23: qty 4

## 2015-11-23 MED ORDER — ALBUTEROL SULFATE (2.5 MG/3ML) 0.083% IN NEBU
2.5000 mg | INHALATION_SOLUTION | RESPIRATORY_TRACT | Status: DC | PRN
Start: 2015-11-23 — End: 2015-11-30
  Administered 2015-11-27 – 2015-11-30 (×6): 2.5 mg via RESPIRATORY_TRACT
  Filled 2015-11-23 (×6): qty 3

## 2015-11-23 NOTE — ED Notes (Signed)
Nurse unable to take report. Will call back  

## 2015-11-23 NOTE — Consult Note (Signed)
Patient ID: Theresa Cortez MRN: [id number], DOB/AGE: [birth year]   Admit date: 11/22/2015   Primary Physician: Gwenyth Bender, MD Primary Cardiologist: New (Dr. Royann Shivers)  Pt. Profile:  82 y/o female with no prior cardiac history, admitted for new onset CHF. 3 week history of progressive dyspnea, cough, LEE and mid scapular pain. Also with elevated d-dimer.   Problem List  Past Medical History  Diagnosis Date  . Hypertension   . Diabetes mellitus without complication (HCC)   . Rheumatoid arthritis(714.0)   . DJD (degenerative joint disease)   . Wound infection (HCC)     Right knee   . Hypothyroidism   . History of diverticulosis   . Hyperlipidemia     Past Surgical History  Procedure Laterality Date  . Knee surgery      arthroscopic  . Joint replacement  08/07/2011    knee  . Partial colectomy      descending colon by Dr. Orson Slick     Allergies  Allergies  Allergen Reactions  . Penicillins     HPI  82 y/o female with no prior cardiac history, admitted for new onset CHF. Her PMH is notable for HTN, diabetes mellitus type 2 without complication, rheumatoid arthritis and hypothyroidism, on levothyroxine. She is followed medically by Dr. Willey Blade.   She presented to [facility] on 11/22/15 with complaints of a 3 week history of worsening dyspnea, cough, LEE and mid scapular pain. She initially presented to an urgent care several weeks ago with the same complaints and was felt to have an URI. She was given Jerilynn Som and an antibiotic w/o improvement. Given worsening symptoms, she came to the ED where w/u revealed newly diagnosed acute CHF. BNP is abnormal at 1732.2. 2D echo is pending. D-dimer is also abnormal at 2.19. Troponin I is negative x 1. CXR shows cardiomegaly with tiny pleural effusions and bibasilar atelectasis. Labs suggest renal insufficiency with BUN of 27 and SCr at 1.65 (unsure if this is her baseline, last BMP was 3 years ago and renal indices were WNL) . TSH is  WNL.   She has been admitted by IM. She is receiving IV lasix, 40 mg Q12h. BP is stable. She denies any anterior chest pain.   Home Medications  Prior to Admission medications   Medication Sig Start Date End Date Taking? Authorizing Provider  aspirin 81 MG tablet Take 81 mg by mouth daily.   Yes Historical Provider, MD  atorvastatin (LIPITOR) 10 MG tablet Take 10 mg by mouth daily.   Yes Historical Provider, MD  benzonatate (TESSALON) 200 MG capsule Take 200 mg by mouth every 8 (eight) hours as needed for cough.   Yes Historical Provider, MD  diclofenac sodium (VOLTAREN) 1 % GEL Apply 4 g topically 4 (four) times daily.   Yes Historical Provider, MD  doxycycline (VIBRA-TABS) 100 MG tablet Take 100 mg by mouth 2 (two) times daily.   Yes Historical Provider, MD  folic acid (FOLVITE) 1 MG tablet Take 1 mg by mouth daily.   Yes Historical Provider, MD  glimepiride (AMARYL) 2 MG tablet Take 2-4 mg by mouth daily before breakfast. Take 4 mg every morning  Take 2 mg every evening   Yes Historical Provider, MD  hydroxychloroquine (PLAQUENIL) 200 MG tablet Take 200 mg by mouth 2 (two) times daily.   Yes Historical Provider, MD  latanoprost (XALATAN) 0.005 % ophthalmic solution Place 1 drop into both eyes at bedtime.   Yes Historical Provider, MD  levothyroxine (SYNTHROID, LEVOTHROID) 50 MCG tablet Take 75 mcg by mouth daily.    Yes Historical Provider, MD  methotrexate (RHEUMATREX) 2.5 MG tablet Take 25 mg by mouth once a week. Caution:Chemotherapy. Protect from light. On Friday   Yes Historical Provider, MD  predniSONE (DELTASONE) 20 MG tablet Take 20 mg by mouth See admin instructions. Take 60 mg daily x3 days Take 40 mg daily x3 days Take 20 mg daily x3 days   Yes Historical Provider, MD  sitaGLIPtin (JANUVIA) 50 MG tablet Take 50 mg by mouth daily.   Yes Historical Provider, MD  valsartan (DIOVAN) 80 MG tablet Take 80 mg by mouth daily.   Yes Historical Provider, MD    Family History  Family  History  Problem Relation Age of Onset  . Hypertension Mother     Social History  Social History   Social History  . Marital Status: Widowed    Spouse Name: N/A  . Number of Children: N/A  . Years of Education: N/A   Occupational History  . Not on file.   Social History Main Topics  . Smoking status: Never Smoker   . Smokeless tobacco: Not on file  . Alcohol Use: No  . Drug Use: Not on file  . Sexual Activity: Not on file   Other Topics Concern  . Not on file   Social History Narrative     Review of Systems General:  No chills, fever, night sweats or weight changes.  Cardiovascular:  No chest pain, dyspnea on exertion, edema, orthopnea, palpitations, paroxysmal nocturnal dyspnea. Dermatological: No rash, lesions/masses Respiratory: No cough, dyspnea Urologic: No hematuria, dysuria Abdominal:   No nausea, vomiting, diarrhea, bright red blood per rectum, melena, or hematemesis Neurologic:  No visual changes, wkns, changes in mental status. All other systems reviewed and are otherwise negative except as noted above.  Physical Exam  Blood pressure 101/53, pulse 91, temperature 97.8 F (36.6 C), temperature source Oral, resp. rate 20, height 5\' 6"  (1.676 m), weight 186 lb 1.1 oz (84.4 kg), SpO2 100 %.  General: Pleasant, NAD Psych: Normal affect. Neuro: Alert and oriented X 3. Moves all extremities spontaneously. HEENT: Normal  Neck: Supple without bruits or JVD. Lungs:  Resp regular and unlabored, CTA. Heart: RRR no s3, s4, or murmurs. Abdomen: Soft, non-tender, non-distended, BS + x 4.  Extremities: No clubbing, cyanosis. Trace edema, glossy skin appearance of LEE. DP/PT/Radials 2+ and equal bilaterally.  Labs  Troponin (Point of Care Test) No results for input(s): TROPIPOC in the last 72 hours.  Recent Labs  11/23/15 0109  TROPONINI <0.03   Lab Results  Component Value Date   WBC 8.1 11/23/2015   HGB 11.0* 11/23/2015   HCT 34.1* 11/23/2015   MCV  94.5 11/23/2015   PLT 217 11/23/2015     Recent Labs Lab 11/23/15 0109  NA 137  K 4.6  CL 101  CO2 24  BUN 27*  CREATININE 1.65*  CALCIUM 9.3  PROT 5.9*  BILITOT 0.9  ALKPHOS 231*  ALT 257*  AST 133*  GLUCOSE 205*   No results found for: CHOL, HDL, LDLCALC, TRIG Lab Results  Component Value Date   DDIMER 2.19* 11/23/2015     Radiology/Studies  Dg Chest 2 View  11/22/2015  CLINICAL DATA:  Cough for 2 day EXAM: CHEST  2 VIEW COMPARISON:  04/26/2015 FINDINGS: The heart is moderately enlarged. Lungs are hyperaerated. Pulmonary vascular is within normal limits. Tiny pleural effusions and bibasilar atelectasis. No pneumothorax. IMPRESSION:  Cardiomegaly without decompensation. Tiny pleural effusions and bibasilar atelectasis. Electronically Signed   By: Jolaine Click M.D.   On: 11/22/2015 18:02   US Renal  11/23/2015  CLINICAL DATA:  Acute renal failure EXAM: RENAL / URINARY TRACT ULTRASOUND COMPLETE COMPARISON:  01/27/2011 FINDINGS: Right Kidney: Length: 10.9 cm. Normal echogenicity with no mass or hydronephrosis. 9 x 5 x 5 mm exophytic lower pole cyst. Left Kidney: Length: 9.7 cm. Echogenicity within normal limits. No mass or hydronephrosis visualized. Bladder: Appears normal for degree of bladder distention. IMPRESSION: No significant abnormalities Electronically Signed   By: Esperanza Heir M.D.   On: 11/23/2015 12:21   US Abdomen Limited Ruq  11/23/2015  CLINICAL DATA:  Elevated liver function tests EXAM: US ABDOMEN LIMITED - RIGHT UPPER QUADRANT COMPARISON:  12/28/2004 FINDINGS: Gallbladder: No gallstones or wall thickening visualized. No sonographic Murphy sign noted by sonographer. Common bile duct: Diameter: 3 mm Liver: No focal lesion identified. Within normal limits in parenchymal echogenicity. IMPRESSION: Normal right upper quadrant ultrasound Electronically Signed   By: Esperanza Heir M.D.   On: 11/23/2015 12:18    ECG  NSR. Low voltage QRS complexes.   Echocardiogram  - pending   ASSESSMENT AND PLAN  Principal Problem:   Acute congestive heart failure (HCC) Active Problems:   Hypertension   DM (diabetes mellitus) (HCC)   AKI (acute kidney injury) (HCC)   1. Acute CHF: BNP abnormal at 1700. CXR with cardiomegaly and tiny pleural effusions. 2D echo pending to assessLVEF. She has mild LEE on exam. Continue lasix. She is diuresing well. -2.6. Continue strict I/os. Monitor renal function and K. Low sodium diet. Keep BP controlled. Further cardiac w/u pending echo results.   2. Abnormal D-dimer: elevated at 2.19. Given dyspnea and scapular pain, consider imaging to r/o PE. Consider V/Q scan given renal insufficiency. Also check LE dopplers to r/o DVT.   3. AKI: Labs suggest renal insufficiency with BUN of 27 and SCr at 1.65 (unsure if this is her baseline, last BMP was 3 years ago and renal indices were WNL). Monitor given IV lasix use.   4. HTN: well controlled on current regimen.   5. DM: per IM/PCP   Signed, SIMMONS, BRITTAINY, PA-C 11/23/2015, 1:04 PM  I have seen and examined the patient along with SIMMONS, BRITTAINY, PA-C.  I have reviewed the chart, notes and new data.  I agree with PA/'s note.  Key new complaints: dyspnea much improved, lying flat in bed Key examination changes: she is now tachycardic, irregular rhythm c/w Afib on monitor; + S3; 3/6 TR holosystolic LLSB murmur. Cannot exclude apical holosystolic murmur as well Key new findings / data: echo pending, note marked increase in heart size on CXR since 04/2014, suggesting RVa nd LV dilation; ECG with biatrial enlargement and LBBB (no old ECG).  PLAN:  Echo pending, but exam suggests dilated cardiomyopathy and biventricular dysfunction.  Improved after diuretics. Reevaluate after echo. If LVEF is decreased, needs workup for CAD (no angina). Need to be cautious and avoid neprhotoxic agents (including contrast) unless clearly necessary. Abnormal LFTs c/w right heart failure/passive  congestion. Renal dysfunction may be due to intrinsic disease (DM/HTN), but also due to poor cardiac output.  Thurmon Fair, MD, [facility] [phone] 11/23/2015, 4:26 PM

## 2015-11-23 NOTE — Progress Notes (Addendum)
ANTICOAGULATION CONSULT NOTE - Follow Up Consult  Pharmacy Consult for heparin Indication: atrial fibrillation  Allergies  Allergen Reactions  . Penicillins     Patient Measurements: Height: 5\' 6"  (167.6 cm) Weight: 186 lb 1.1 oz (84.4 kg) IBW/kg (Calculated) : 59.3 Heparin Dosing Weight: 77.2 kg  Vital Signs: Temp: 98 F (36.7 C) (03/14 1331) Temp Source: Oral (03/14 1331) BP: 116/55 mmHg (03/14 1622) Pulse Rate: 72 (03/14 1622)  Labs:  Recent Labs  11/22/15 1659 11/23/15 0109  HGB 12.0 11.0*  HCT 36.8 34.1*  PLT 209 217  CREATININE 1.63* 1.65*  TROPONINI  --  <0.03    Estimated Creatinine Clearance: 28.3 mL/min (by C-G formula based on Cr of 1.65).   Medications:  Scheduled:  . aspirin  81 mg Oral Daily  . diclofenac sodium  4 g Topical QID  . diltiazem  15 mg Intravenous Once  . folic acid  1 mg Oral Daily  . [START ON 11/24/2015] furosemide  40 mg Intravenous Q12H  . guaiFENesin  1,200 mg Oral BID  . hydroxychloroquine  200 mg Oral BID  . insulin aspart  0-5 Units Subcutaneous QHS  . insulin aspart  0-9 Units Subcutaneous TID WC  . ipratropium-albuterol  3 mL Nebulization BID  . latanoprost  1 drop Both Eyes QHS  . levothyroxine  75 mcg Oral QAC breakfast  . loratadine  10 mg Oral Daily  . sodium chloride flush  3 mL Intravenous Q12H   Infusions:  . diltiazem (CARDIZEM) infusion      Assessment: 82 yo female with afib will be switched to heparin.  Patient had one dose of lovenox 30 mg at 1305 today.  Hgb 11 and Plt 217 K today; patient has renal dysfunction.  Goal of Therapy:  Heparin level 0.3-0.7 units/ml Monitor platelets by anticoagulation protocol: Yes   Plan:  - d/c lovenox - start heparin 1000 units/hr at 1800 tonight.  No bolus - 8hr heparin level - daily heparin level and CBC  Winnie Umali, Tsz-Yin 11/23/2015,4:33 PM

## 2015-11-23 NOTE — Progress Notes (Signed)
Pt went  Off the floor for a perfusion test at the radiology unit.  Pt HR rhythm showing aflutter on the monitor,this RN wnet to check on pt and she seems to be ok. Andres Ege, MD notified. Will continue to monitor.

## 2015-11-23 NOTE — Care Management Note (Signed)
Case Management Note Donn Pierini RN, BSN Unit 2W-Case Manager [phone]  Patient Details  Name: ARLIN SAVONA MRN: [id number] Date of Birth: [birth year]  Subjective/Objective:  Pt admitted with Acute HF                    Action/Plan: PTA pt lived at home with daughter, CM to follow for potential d/c needs  Expected Discharge Date:                  Expected Discharge Plan:  Home w Home Health Services  In-House Referral:     Discharge planning Services  CM Consult  Post Acute Care Choice:    Choice offered to:     DME Arranged:    DME Agency:     HH Arranged:    HH Agency:     Status of Service:  In process, will continue to follow  Medicare Important Message Given:    Date Medicare IM Given:    Medicare IM give by:    Date Additional Medicare IM Given:    Additional Medicare Important Message give by:     If discussed at Long Length of Stay Meetings, dates discussed:    Additional Comments:  Darrold Span, RN 11/23/2015, 3:09 PM

## 2015-11-23 NOTE — Progress Notes (Signed)
*  PRELIMINARY RESULTS* Vascular Ultrasound Lower extremity venous duplex has been completed.  Preliminary findings: No evidence of DVT or baker's cyst. Pulsatile venous flow suggestive of increased right side heart pressure.  Farrel Demark, RDMS, RVT  11/23/2015, 1:50 PM

## 2015-11-23 NOTE — Progress Notes (Signed)
Pt CBG was 51, pt given 1 packet of graham cracker and of orange juice in addition to her lunch. Her blood sugar when rechecked at 1327 was 69. Will continue to monitor

## 2015-11-23 NOTE — Progress Notes (Addendum)
[facility] PROGRESS NOTE  Theresa Cortez ACZ:[id number] DOB: 25-Jul-1930 DOA: 11/22/2015 PCP: Gwenyth Bender, MD  Subjective: Patient reports continued back pain and coughing with sputum that gets caught in her throat. She states that overall, she's feeling slightly better than she did yesterday.  Assessment/Plan: Principal Problem: Acute decompensated heart failure unspecified: Continues to have lower extremity edema-has mild chronic orthopnea at baseline. But denies shortness of breath. Continue IV Lasix, check echo. -2.5 L since admission, weight essentially unchanged. Follow.  ARF:? Etiology-mildly volume overloaded on exam. On valsartan as outpatient. No proteinuria on UA. Check renal ultrasound, continue gentle diuresis, follow electrolytes closely.  Elevated liver enzymes: Not sure what the exact etiology is, could have congestive hepatitis. Check RUQ ultrasound, hepatitis serology.  Cough: Likely secondary to postviral syndrome. Supportive care-Mucinex, incentive spirometry, nebulized bronchodilators. Do not see any indication for antibiotics-she already has at 3 rounds of antibiotics as an outpatient.  Elevated d-dimer: Not hypoxic, not tachycardic. Without chest pain or shortness of breath. Low suspicion for pulmonary embolism, we will check lower extremity Dopplers/VQ scan, await echo and follow.  Hypothyroidism: Continue levothyroxine:  History of rheumatoid arthritis: Continue to hold methotrexate-cautiously continue with Plaquenil-follow LFTs.  Hyperlipidemia: Lipitor held d/t elevated LFTs. Resume statin when able.   Type 2 diabetes: Continue to hold oral hypoglycemics, CBGs currently stable with SSI.  Hypertension: Normotensive currently. Valsartan on hold  Code Status: Full Family Communication: Daughter at bedside Disposition Plan: Home when ready. DVT prophylaxis:  Lovenox   Consultants:  Cardiology  Procedures:  None  Antibiotics:  None   Objective: Filed Vitals:   11/23/15 0426 11/23/15 1009  BP: 119/68 101/53  Pulse: 96 91  Temp: 98 F (36.7 C) 97.8 F (36.6 C)  Resp: 20     Intake/Output Summary (Last 24 hours) at 11/23/15 1032 Last data filed at 11/23/15 0800  Gross per 24 hour  Intake    240 ml  Output   2800 ml  Net  -2560 ml   Filed Weights   11/22/15 1611 11/23/15 0039 11/23/15 0426  Weight: 84.369 kg (186 lb) 86.002 kg (189 lb 9.6 oz) 84.4 kg (186 lb 1.1 oz)    Exam:   General:  WDWN, in NAD. Eating breakfast, in bed.   Cardiovascular: Normal S1, S2. No M/C/R/G. 2+ pitting edema to knee. R leg > L leg secondary to previous R knee surgery.  Respiratory: Diffuse bibasilar crackles. Vesicular sounds otherwise. No increased WOB. No wheezes or rhonchi.  Abdomen: Soft, NTND, +BS  Musculoskeletal: Moves all extremities against gravity. Good tone.  Data Reviewed: Basic Metabolic Panel:  Recent Labs Lab 11/22/15 1659 11/23/15 0109  NA 137 137  K 4.9 4.6  CL 102 101  CO2 21* 24  GLUCOSE 208* 205*  BUN 29* 27*  CREATININE 1.63* 1.65*  CALCIUM 9.6 9.3   Liver Function Tests:  Recent Labs Lab 11/22/15 1659 11/23/15 0109  AST 178* 133*  ALT 273* 257*  ALKPHOS 252* 231*  BILITOT 1.4* 0.9  PROT 6.5 5.9*  ALBUMIN 3.5 3.3*   No results for input(s): LIPASE, AMYLASE in the last 168 hours. No results for input(s): AMMONIA in the last 168 hours. CBC:  Recent Labs Lab 11/22/15 1659 11/23/15 0109  WBC 7.8 8.1  NEUTROABS  --  5.3  HGB 12.0 11.0*  HCT 36.8 34.1*  MCV 94.8 94.5  PLT 209 217   Cardiac Enzymes:  Recent Labs Lab 11/23/15 0109  TROPONINI <0.03   BNP (last 3  results)  Recent Labs  11/22/15 2052  BNP 1732.2*    ProBNP (last 3 results) No results for input(s): PROBNP in the last 8760 hours.  CBG:  Recent Labs Lab 11/23/15 0054 11/23/15 0622  GLUCAP 175* 100*     No results found for this or any previous visit (from the past 240 hour(s)).   Studies: Dg Chest 2 View  11/22/2015  CLINICAL DATA:  Cough for 2 day EXAM: CHEST  2 VIEW COMPARISON:  04/26/2015 FINDINGS: The heart is moderately enlarged. Lungs are hyperaerated. Pulmonary vascular is within normal limits. Tiny pleural effusions and bibasilar atelectasis. No pneumothorax. IMPRESSION: Cardiomegaly without decompensation. Tiny pleural effusions and bibasilar atelectasis. Electronically Signed   By: Jolaine Click M.D.   On: 11/22/2015 18:02    Scheduled Meds: . aspirin  81 mg Oral Daily  . diclofenac sodium  4 g Topical QID  . enoxaparin (LOVENOX) injection  30 mg Subcutaneous Q24H  . folic acid  1 mg Oral Daily  . [START ON 11/24/2015] furosemide  40 mg Intravenous Q12H  . hydroxychloroquine  200 mg Oral BID  . insulin aspart  0-5 Units Subcutaneous QHS  . insulin aspart  0-9 Units Subcutaneous TID WC  . latanoprost  1 drop Both Eyes QHS  . levothyroxine  75 mcg Oral QAC breakfast  . sodium chloride flush  3 mL Intravenous Q12H   Continuous Infusions:   Principal Problem:   Acute congestive heart failure (HCC) Active Problems:   Hypertension   DM (diabetes mellitus) (HCC)   AKI (acute kidney injury) (HCC)   Time spent 30 minutes-Greater than 50% of this time was spent in counseling, explanation of diagnosis, planning of further management, and coordination of care.  Windell Norfolk MD   [facility] If 7PM-7AM, please contact night-coverage at [email or web address], password [facility] 11/23/2015, 10:32 AM  LOS: 1 day

## 2015-11-23 NOTE — Progress Notes (Signed)
Patient lying in bed, daughters at bedside. No pain,distress or needs expressed at this time. Call light within reach.

## 2015-11-23 NOTE — Progress Notes (Signed)
Utilization review completed. Sabriel Borromeo, RN, BSN. 

## 2015-11-24 ENCOUNTER — Other Ambulatory Visit (HOSPITAL_COMMUNITY): Payer: Medicare Other

## 2015-11-24 ENCOUNTER — Inpatient Hospital Stay (HOSPITAL_COMMUNITY): Payer: Medicare Other

## 2015-11-24 DIAGNOSIS — I272 Other secondary pulmonary hypertension: Secondary | ICD-10-CM

## 2015-11-24 DIAGNOSIS — I5041 Acute combined systolic (congestive) and diastolic (congestive) heart failure: Secondary | ICD-10-CM

## 2015-11-24 DIAGNOSIS — I5042 Chronic combined systolic (congestive) and diastolic (congestive) heart failure: Secondary | ICD-10-CM | POA: Insufficient documentation

## 2015-11-24 DIAGNOSIS — I509 Heart failure, unspecified: Secondary | ICD-10-CM

## 2015-11-24 DIAGNOSIS — I340 Nonrheumatic mitral (valve) insufficiency: Secondary | ICD-10-CM

## 2015-11-24 DIAGNOSIS — I071 Rheumatic tricuspid insufficiency: Secondary | ICD-10-CM | POA: Insufficient documentation

## 2015-11-24 LAB — COMPREHENSIVE METABOLIC PANEL
ALBUMIN: 2.9 g/dL — AB (ref 3.5–5.0)
ALT: 273 U/L — ABNORMAL HIGH (ref 14–54)
ANION GAP: 11 (ref 5–15)
AST: 125 U/L — AB (ref 15–41)
Alkaline Phosphatase: 194 U/L — ABNORMAL HIGH (ref 38–126)
BUN: 29 mg/dL — ABNORMAL HIGH (ref 6–20)
CO2: 26 mmol/L (ref 22–32)
Calcium: 8.8 mg/dL — ABNORMAL LOW (ref 8.9–10.3)
Chloride: 100 mmol/L — ABNORMAL LOW (ref 101–111)
Creatinine, Ser: 1.77 mg/dL — ABNORMAL HIGH (ref 0.44–1.00)
GFR calc Af Amer: 29 mL/min — ABNORMAL LOW (ref >60)
GFR calc non Af Amer: 25 mL/min — ABNORMAL LOW (ref >60)
GLUCOSE: 145 mg/dL — AB (ref 65–99)
POTASSIUM: 4.6 mmol/L (ref 3.5–5.1)
SODIUM: 137 mmol/L (ref 135–145)
Total Bilirubin: 1.3 mg/dL — ABNORMAL HIGH (ref 0.3–1.2)
Total Protein: 5.5 g/dL — ABNORMAL LOW (ref 6.5–8.1)

## 2015-11-24 LAB — GLUCOSE, CAPILLARY
GLUCOSE-CAPILLARY: 109 mg/dL — AB (ref 65–99)
GLUCOSE-CAPILLARY: 74 mg/dL (ref 65–99)
Glucose-Capillary: 140 mg/dL — ABNORMAL HIGH (ref 65–99)
Glucose-Capillary: 141 mg/dL — ABNORMAL HIGH (ref 65–99)

## 2015-11-24 LAB — HEPARIN LEVEL (UNFRACTIONATED)
Heparin Unfractionated: 0.84 IU/mL — ABNORMAL HIGH (ref 0.30–0.70)
Heparin Unfractionated: 0.81 IU/mL — ABNORMAL HIGH (ref 0.30–0.70)
Heparin Unfractionated: 0.67 IU/mL (ref 0.30–0.70)

## 2015-11-24 LAB — ECHOCARDIOGRAM COMPLETE
HEIGHTINCHES: 66 in
Weight: 3025.6 oz

## 2015-11-24 LAB — HEMOGLOBIN A1C
Hgb A1c MFr Bld: 7.3 % — ABNORMAL HIGH (ref 4.8–5.6)
MEAN PLASMA GLUCOSE: 163 mg/dL

## 2015-11-24 LAB — CBC
HEMATOCRIT: 35.5 % — AB (ref 36.0–46.0)
HEMOGLOBIN: 11.3 g/dL — AB (ref 12.0–15.0)
MCH: 30.4 pg (ref 26.0–34.0)
MCHC: 31.8 g/dL (ref 30.0–36.0)
MCV: 95.4 fL (ref 78.0–100.0)
Platelets: 223 10*3/uL (ref 150–400)
RBC: 3.72 MIL/uL — ABNORMAL LOW (ref 3.87–5.11)
RDW: 18.6 % — ABNORMAL HIGH (ref 11.5–15.5)
WBC: 7.2 10*3/uL (ref 4.0–10.5)

## 2015-11-24 LAB — HEPATITIS PANEL, ACUTE
HCV Ab: <0.1 s/co ratio (ref 0.0–0.9)
HEP B C IGM: NEGATIVE
HEP B S AG: NEGATIVE
Hep A IgM: NEGATIVE

## 2015-11-24 MED ORDER — FUROSEMIDE 10 MG/ML IJ SOLN
80.0000 mg | Freq: Once | INTRAMUSCULAR | Status: AC
  Administered 2015-11-24: 80 mg via INTRAVENOUS
  Filled 2015-11-24: qty 8

## 2015-11-24 MED ORDER — FUROSEMIDE 10 MG/ML IJ SOLN: 40.0000 mg | Freq: Two times a day (BID) | INTRAMUSCULAR | Status: DC

## 2015-11-24 MED ORDER — NITROGLYCERIN IN D5W 200-5 MCG/ML-% IV SOLN
20.0000 ug/min | INTRAVENOUS | Status: DC
  Administered 2015-11-24: 10 ug/min via INTRAVENOUS
  Filled 2015-11-24: qty 250

## 2015-11-24 MED ORDER — HEPARIN (PORCINE) IN NACL 100-0.45 UNIT/ML-% IJ SOLN
750.0000 [IU]/h | INTRAMUSCULAR | Status: DC
  Administered 2015-11-24 (×2): 750 [IU]/h via INTRAVENOUS
  Filled 2015-11-24: qty 250

## 2015-11-24 NOTE — Progress Notes (Signed)
[facility] PROGRESS NOTE  Theresa Cortez YPP:[id number] DOB: 1930/07/26 DOA: 11/22/2015 PCP: Gwenyth Bender, MD  Subjective: Continues to cough-but no SOB.  Assessment/Plan: Principal Problem: Acute decompensated heart failure unspecified: Continues to have lower extremity edema to the knee-has mild chronic orthopnea at baseline. Still denies shortness of breath. Continue IV Lasix. Cardiology consulted, echo pending. -3 L since admission, but weight essentially unchanged from admission. Follow.  Active Problems: Afib WPY:KDXI better controlled with cardizem gtt.Await Echo-will slowly transition to oral agents. Continue IV Heparin-CHA2DS2-VASc Score is 5-once cardiology determines no procedures are required inpatient, will transition to oral anticoagulants.  ARF vs CKD stage 3:? Etiology-mildly volume overloaded on exam. On valsartan as outpatient. No proteinuria on UA. Renal ultrasound without hydronephrosis. normal, continue gentle diuresis, follow electrolytes closely.Obtain records from PCP's office.  Elevated liver enzymes: Not sure what the exact etiology is, could have congestive hepatitis-await Echo. RUQ ultrasound, hepatitis panel negative. LFTs stable for now.  Cough: Likely secondary to postviral syndrome. Supportive care-Mucinex, incentive spirometry, nebulized bronchodilators. Do not see any indication for antibiotics-she already had 3 rounds of antibiotics as an outpatient.  Elevated d-dimer: Not hypoxic, not tachycardic. Without chest pain or shortness of breath. Low suspicion for pulmonary embolism. LE doppler negative for DVT, VQ scan negative for PE. Echocardiogram pending. Follow.  Hypothyroidism: TSH: WNL. Continue levothyroxine.  History of rheumatoid arthritis: Continue to hold methotrexate-cautiously continue with Plaquenil. Follow LFTs.  Hyperlipidemia: Lipitor held d/t elevated LFTs. Resume statin when able.   Type 2 diabetes: Continue to hold oral  hypoglycemics. Had one episode of hypoglycemia 11/23/2015. CBGs otherwise stable with SSI.Likely will need adjustment of oral hypoglycemics on discharge-given renal function  Hypertension: Normotensive currently. Valsartan on hold  Code Status: Full Family Communication: None at bedside Disposition Plan: Home when ready. DVT prophylaxis: Heparin   Consultants:  Cardiology  Procedures:  None  Antibiotics:  None  Objective: Filed Vitals:   11/23/15 2130 11/24/15 0630  BP: 108/60 110/66  Pulse: 103 100  Temp: 98.1 F (36.7 C) 98.5 F (36.9 C)  Resp: 18 18    Intake/Output Summary (Last 24 hours) at 11/24/15 0851 Last data filed at 11/24/15 0700  Gross per 24 hour  Intake    480 ml  Output   1000 ml  Net   -520 ml   Filed Weights   11/23/15 0039 11/23/15 0426 11/24/15 0630  Weight: 86.002 kg (189 lb 9.6 oz) 84.4 kg (186 lb 1.1 oz) 85.775 kg (189 lb 1.6 oz)    Exam:   General:  WDWN, in NAD. More conversational and alert than yesterday. Appears uncomfortable.   Cardiovascular: RRR no M/C/R/G. +2 pitting edema to the knee.  Respiratory: Bilateral wheezes R>L, diffuse rhonchi throughout. No consolidation noted.   Abdomen: Soft, NT, ND. +BS.  Musculoskeletal: Moves all limbs against gravity.  Data Reviewed: Basic Metabolic Panel:  Recent Labs Lab 11/22/15 1659 11/23/15 0109 11/24/15 0320  NA 137 137 137  K 4.9 4.6 4.6  CL 102 101 100*  CO2 21* 24 26  GLUCOSE 208* 205* 145*  BUN 29* 27* 29*  CREATININE 1.63* 1.65* 1.77*  CALCIUM 9.6 9.3 8.8*   Liver Function Tests:  Recent Labs Lab 11/22/15 1659 11/23/15 0109 11/24/15 0320  AST 178* 133* 125*  ALT 273* 257* 273*  ALKPHOS 252* 231* 194*  BILITOT 1.4* 0.9 1.3*  PROT 6.5 5.9* 5.5*  ALBUMIN 3.5 3.3* 2.9*   No results for input(s): LIPASE, AMYLASE in the last 168 hours.  No results for input(s): AMMONIA in the last 168 hours. CBC:  Recent Labs Lab 11/22/15 1659 11/23/15 0109  11/24/15 0320  WBC 7.8 8.1 7.2  NEUTROABS  --  5.3  --   HGB 12.0 11.0* 11.3*  HCT 36.8 34.1* 35.5*  MCV 94.8 94.5 95.4  PLT 209 217 223   Cardiac Enzymes:  Recent Labs Lab 11/23/15 0109  TROPONINI <0.03   BNP (last 3 results)  Recent Labs  11/22/15 2052  BNP 1732.2*    ProBNP (last 3 results) No results for input(s): PROBNP in the last 8760 hours.  CBG:  Recent Labs Lab 11/23/15 1251 11/23/15 1327 11/23/15 1647 11/23/15 2128 11/24/15 0626  GLUCAP 51* 69 142* 184* 140*    No results found for this or any previous visit (from the past 240 hour(s)).   Studies: Dg Chest 2 View  11/22/2015  CLINICAL DATA:  Cough for 2 day EXAM: CHEST  2 VIEW COMPARISON:  04/26/2015 FINDINGS: The heart is moderately enlarged. Lungs are hyperaerated. Pulmonary vascular is within normal limits. Tiny pleural effusions and bibasilar atelectasis. No pneumothorax. IMPRESSION: Cardiomegaly without decompensation. Tiny pleural effusions and bibasilar atelectasis. Electronically Signed   By: Jolaine Click M.D.   On: 11/22/2015 18:02   US Renal  11/23/2015  CLINICAL DATA:  Acute renal failure EXAM: RENAL / URINARY TRACT ULTRASOUND COMPLETE COMPARISON:  01/27/2011 FINDINGS: Right Kidney: Length: 10.9 cm. Normal echogenicity with no mass or hydronephrosis. 9 x 5 x 5 mm exophytic lower pole cyst. Left Kidney: Length: 9.7 cm. Echogenicity within normal limits. No mass or hydronephrosis visualized. Bladder: Appears normal for degree of bladder distention. IMPRESSION: No significant abnormalities Electronically Signed   By: Esperanza Heir M.D.   On: 11/23/2015 12:21   Nm Pulmonary Perf And Vent  11/23/2015  CLINICAL DATA:  82 year old female with progressive shortness of breath cough and mid scapular pain. Abnormal D-dimer. Initial encounter. EXAM: NUCLEAR MEDICINE VENTILATION - PERFUSION LUNG SCAN TECHNIQUE: Ventilation images were obtained in multiple projections using inhaled aerosol Tc-66m DTPA.  Perfusion images were obtained in multiple projections after intravenous injection of Tc-32m MAA. RADIOPHARMACEUTICALS:  32.0 Technetium-18m DTPA aerosol inhalation and 4.3 Technetium-25m MAA IV COMPARISON:  PA and lateral chest radiographs 11/22/2015. FINDINGS: Ventilation: No ventilation defect identified. Photopenia related to cardiomegaly noted. Perfusion: Homogeneous perfusion radiotracer activity. No wedge shaped peripheral perfusion defects to suggest acute pulmonary embolism. IMPRESSION: Normal VQ scan. Electronically Signed   By: Odessa Fleming M.D.   On: 11/23/2015 16:07   US Abdomen Limited Ruq  11/23/2015  CLINICAL DATA:  Elevated liver function tests EXAM: US ABDOMEN LIMITED - RIGHT UPPER QUADRANT COMPARISON:  12/28/2004 FINDINGS: Gallbladder: No gallstones or wall thickening visualized. No sonographic Murphy sign noted by sonographer. Common bile duct: Diameter: 3 mm Liver: No focal lesion identified. Within normal limits in parenchymal echogenicity. IMPRESSION: Normal right upper quadrant ultrasound Electronically Signed   By: Esperanza Heir M.D.   On: 11/23/2015 12:18    Scheduled Meds: . aspirin  81 mg Oral Daily  . diclofenac sodium  4 g Topical QID  . folic acid  1 mg Oral Daily  . furosemide  40 mg Intravenous Q12H  . guaiFENesin  1,200 mg Oral BID  . hydroxychloroquine  200 mg Oral BID  . insulin aspart  0-5 Units Subcutaneous QHS  . insulin aspart  0-9 Units Subcutaneous TID WC  . ipratropium-albuterol  3 mL Nebulization BID  . latanoprost  1 drop Both Eyes QHS  . levothyroxine  75 mcg Oral QAC breakfast  . loratadine  10 mg Oral Daily  . sodium chloride flush  3 mL Intravenous Q12H   Continuous Infusions: . diltiazem (CARDIZEM) infusion 5 mg/hr (11/24/15 0327)  . heparin 900 Units/hr (11/24/15 0331)    Principal Problem:   Acute congestive heart failure (HCC) Active Problems:   Hypertension   DM (diabetes mellitus) (HCC)   AKI (acute kidney injury) (HCC)   Time  spent 30 minutes-Greater than 50% of this time was spent in counseling, explanation of diagnosis, planning of further management, and coordination of care.  Mariana Single  [facility] If 7PM-7AM, please contact night-coverage at [email or web address], password [facility] 11/24/2015, 8:51 AM  LOS: 2 days    Attending MD note  Patient was seen, examined,treatment plan was discussed with the PA-S.  I have personally reviewed the clinical findings, lab, imaging studies and management of this patient in detail. I agree with the documentation, as recorded by the PA-S.   Feels somewhat better, less shortness of breath but continues to cough. Went into A. fib RVR on 3/14-started on Cardizem infusion and IV heparin. Rate better controlled, will convert to oral agent shortly, await echo. I suspect she has developed CKD over the past few years-likely hypertensive nephrosclerosis. We will obtain records from PCPs office. LFTs appears stable-current suspicion for congestive hepatitis. Await echocardiogram. Await further recommendations from cardiology.   [facility]

## 2015-11-24 NOTE — Progress Notes (Signed)
Patient Name: Theresa Cortez Date of Encounter: 11/24/2015   Primary Cardiologist: Dr. Royann Shivers Patient Profile: Ms. Stenglein is a 82 y/o female with no prior cardiac history, admitted for new onset CHF, following a 3 week history of progressive dyspnea, cough, LEE and mid scapular pain. She has a PMH of HTN, DM, RA, and hypothyroidism. She presented to [facility] on 11/22/15 with cough, back pain, orthopnea, and bilateral leg edema.  Her D- dimer was elevated at 2.19 (VQ scan normal), and BNP was 1732.  Cardiology was consulted for evaluation of CHF.   SUBJECTIVE: Says she feels ok, she is tired.  Has a non productive cough. Denies chest pain, orthopnea.  Complains of severe back pain that gets better when she lies down.   OBJECTIVE Filed Vitals:   11/23/15 1908 11/23/15 2042 11/23/15 2130 11/24/15 0630  BP: 102/60  108/60 110/66  Pulse: 106  103 100  Temp:   98.1 F (36.7 C) 98.5 F (36.9 C)  TempSrc:   Oral Oral  Resp:   18 18  Height:      Weight:    189 lb 1.6 oz (85.775 kg)  SpO2:  97% 100% 100%    Intake/Output Summary (Last 24 hours) at 11/24/15 0749 Last data filed at 11/24/15 0153  Gross per 24 hour  Intake    720 ml  Output   1000 ml  Net   -280 ml   Filed Weights   11/23/15 0039 11/23/15 0426 11/24/15 0630  Weight: 189 lb 9.6 oz (86.002 kg) 186 lb 1.1 oz (84.4 kg) 189 lb 1.6 oz (85.775 kg)    PHYSICAL EXAM General: Well developed, well nourished, female in no acute distress. Head: Normocephalic, atraumatic.  Neck: Supple without bruits, no JVD. Lungs:  Resp regular and unlabored, Inspiratory and expiratory wheezing in all fields.   Heart: RRR, S1, S2, no S3, S4, No murmur; no rub. Abdomen: Soft, non-tender, non-distended, BS + x 4.  Extremities: No clubbing, cyanosis, +1 edema BLE. Neuro: Alert and oriented X 3. Moves all extremities spontaneously. Psych: Normal affect.  LABS: CBC: Recent Labs  11/23/15 0109 11/24/15 0320  WBC 8.1 7.2  NEUTROABS 5.3   --   HGB 11.0* 11.3*  HCT 34.1* 35.5*  MCV 94.5 95.4  PLT 217 223   Basic Metabolic Panel: Recent Labs  11/23/15 0109 11/24/15 0320  NA 137 137  K 4.6 4.6  CL 101 100*  CO2 24 26  GLUCOSE 205* 145*  BUN 27* 29*  CREATININE 1.65* 1.77*  CALCIUM 9.3 8.8*   Liver Function Tests: Recent Labs  11/23/15 0109 11/24/15 0320  AST 133* 125*  ALT 257* 273*  ALKPHOS 231* 194*  BILITOT 0.9 1.3*  PROT 5.9* 5.5*  ALBUMIN 3.3* 2.9*   Cardiac Enzymes: Recent Labs  11/23/15 0109  TROPONINI <0.03   BNP:  B NATRIURETIC PEPTIDE  Date/Time Value Ref Range Status  11/22/2015 08:52 PM 1732.2* 0.0 - 100.0 pg/mL Final   D-dimer: Recent Labs  11/23/15 0109  DDIMER 2.19*   Hemoglobin A1C: Recent Labs  11/23/15 0109  HGBA1C 7.3*   Fasting Lipid Panel:No results for input(s): CHOL, HDL, LDLCALC, TRIG, CHOLHDL, LDLDIRECT in the last 72 hours. Thyroid Function Tests: Recent Labs  11/23/15 0109  TSH 1.242    TELE: NSR  ECG: NSR  Echo: Pending   Radiology/Studies: Dg Chest 2 View  11/22/2015  CLINICAL DATA:  Cough for 2 day EXAM: CHEST  2 VIEW COMPARISON:  04/26/2015  FINDINGS: The heart is moderately enlarged. Lungs are hyperaerated. Pulmonary vascular is within normal limits. Tiny pleural effusions and bibasilar atelectasis. No pneumothorax. IMPRESSION: Cardiomegaly without decompensation. Tiny pleural effusions and bibasilar atelectasis. Electronically Signed   By: Jolaine Click M.D.   On: 11/22/2015 18:02   US Renal  11/23/2015  CLINICAL DATA:  Acute renal failure EXAM: RENAL / URINARY TRACT ULTRASOUND COMPLETE COMPARISON:  01/27/2011 FINDINGS: Right Kidney: Length: 10.9 cm. Normal echogenicity with no mass or hydronephrosis. 9 x 5 x 5 mm exophytic lower pole cyst. Left Kidney: Length: 9.7 cm. Echogenicity within normal limits. No mass or hydronephrosis visualized. Bladder: Appears normal for degree of bladder distention. IMPRESSION: No significant abnormalities  Electronically Signed   By: Esperanza Heir M.D.   On: 11/23/2015 12:21   Nm Pulmonary Perf And Vent  11/23/2015  CLINICAL DATA:  82 year old female with progressive shortness of breath cough and mid scapular pain. Abnormal D-dimer. Initial encounter. EXAM: NUCLEAR MEDICINE VENTILATION - PERFUSION LUNG SCAN TECHNIQUE: Ventilation images were obtained in multiple projections using inhaled aerosol Tc-46m DTPA. Perfusion images were obtained in multiple projections after intravenous injection of Tc-2m MAA. RADIOPHARMACEUTICALS:  32.0 Technetium-69m DTPA aerosol inhalation and 4.3 Technetium-17m MAA IV COMPARISON:  PA and lateral chest radiographs 11/22/2015. FINDINGS: Ventilation: No ventilation defect identified. Photopenia related to cardiomegaly noted. Perfusion: Homogeneous perfusion radiotracer activity. No wedge shaped peripheral perfusion defects to suggest acute pulmonary embolism. IMPRESSION: Normal VQ scan. Electronically Signed   By: Odessa Fleming M.D.   On: 11/23/2015 16:07   US Abdomen Limited Ruq  11/23/2015  CLINICAL DATA:  Elevated liver function tests EXAM: US ABDOMEN LIMITED - RIGHT UPPER QUADRANT COMPARISON:  12/28/2004 FINDINGS: Gallbladder: No gallstones or wall thickening visualized. No sonographic Murphy sign noted by sonographer. Common bile duct: Diameter: 3 mm Liver: No focal lesion identified. Within normal limits in parenchymal echogenicity. IMPRESSION: Normal right upper quadrant ultrasound Electronically Signed   By: Esperanza Heir M.D.   On: 11/23/2015 12:18     Current Medications:  . aspirin  81 mg Oral Daily  . diclofenac sodium  4 g Topical QID  . folic acid  1 mg Oral Daily  . furosemide  40 mg Intravenous Q12H  . guaiFENesin  1,200 mg Oral BID  . hydroxychloroquine  200 mg Oral BID  . insulin aspart  0-5 Units Subcutaneous QHS  . insulin aspart  0-9 Units Subcutaneous TID WC  . ipratropium-albuterol  3 mL Nebulization BID  . latanoprost  1 drop Both Eyes QHS  .  levothyroxine  75 mcg Oral QAC breakfast  . loratadine  10 mg Oral Daily  . sodium chloride flush  3 mL Intravenous Q12H   . diltiazem (CARDIZEM) infusion 5 mg/hr (11/24/15 0327)  . heparin 900 Units/hr (11/24/15 0331)    ASSESSMENT AND PLAN: Principal Problem:   Acute congestive heart failure (HCC) Active Problems:   Hypertension   DM (diabetes mellitus) (HCC)   AKI (acute kidney injury) (HCC)  1. Acute CHF - Echo pending.  - Weight is up 3 pounds since yesterday.  - Negative 2.8L - Does not appear volume overloaded on exam, would hold IV Lasix as Cr is up today.  - Was on ARB at home, avoid due to declining renal function.  2. PAF - It appears that pt. Went into Aflutter/fib  yesterday afternoon. - IV Cardizem and Heparin started per IM - Rate controlled.  - This patients CHA2DS2-VASc Score is 5, she will need long term anticoagulation.  -  Appears to be NSR now (converted around 7am this morning). Will order EKG to assess.   - Would add Metoprolol for continued rate control.  - Consider outpatient monitoring to evaluate Afib burden.   3. HTN - BP soft (on Diltiazem gtt) currently.  - No ARB in setting of renal impairment.   4. SOB - Would benefit from scheduled nebs as she has mostly wheezing with scattered rhonci.  - Normal VQ scan yesterday.   Signed, Little Ishikawa , NP 7:49 AM 11/24/2015   I have seen and examined the patient along with Little Ishikawa , NP.  I have reviewed the chart, notes and new data.  I agree with NP's note.  Key new complaints: she appears more dyspneic and is coughing incessantly, has orthopnea Key examination changes: bilateral wheezing, elevated JVD, S3/summation gallop Key new findings / data: back in sinus (tachycardia). Echo shows severely depressed LV (EF<30%) with regional abnormalities (suggests at least RCA and LAD territory problems), elevated LV filling pressure, moderate to severe eccentric MR (ischemic mechanism), moderate to  severe TR and moderate pulmonary HTN  PLAN: Additional diuresis. IV NTG for vasodilation/acute HF. DC diltiazem. If atrial fibrillation returns, use IV amiodarone for rate/rhythm control. Right and left heart cath, likely with subsequent indication for CABG +/- MV repair. Increased risk of complications (particularly acute renal injury/failure) due to DM and CKD.  Avoid RAAS inhibitors until after cath. Avoid NSAIDs. Monitor renal function closely. May need to start some IV fluids in AM for kidney protection, but right now she appears to be in acute HF and unable to lie flat. IV heparin. Transition to DOAC after cath/surgery.  Thurmon Fair, MD, [facility] [phone] 11/24/2015, 6:16 PM

## 2015-11-24 NOTE — Progress Notes (Signed)
ANTICOAGULATION CONSULT NOTE - Follow Up Consult  Pharmacy Consult:  Heparin Indication: atrial fibrillation  Allergies  Allergen Reactions  . Penicillins     Patient Measurements: Height: 5\' 6"  (167.6 cm) Weight: 189 lb 1.6 oz (85.775 kg) IBW/kg (Calculated) : 59.3 Heparin Dosing Weight: 77 kg  Vital Signs: Temp: 97.9 F (36.6 C) (03/15 1345) Temp Source: Oral (03/15 1345) BP: 115/58 mmHg (03/15 1345) Pulse Rate: 105 (03/15 1345)  Labs:  Recent Labs  11/22/15 1659 11/23/15 0109 11/24/15 0150 11/24/15 0320 11/24/15 1024 11/24/15 2020  HGB 12.0 11.0*  --  11.3*  --   --   HCT 36.8 34.1*  --  35.5*  --   --   PLT 209 217  --  223  --   --   HEPARINUNFRC  --   --  0.84*  --  0.81* 0.67  CREATININE 1.63* 1.65*  --  1.77*  --   --   TROPONINI  --  <0.03  --   --   --   --     Estimated Creatinine Clearance: 26.6 mL/min (by C-G formula based on Cr of 1.77).     Assessment: 48 YOF with AFib to continue on IV heparin. Heparin level is now therapeutic on 750 units/hr. No bleeding reported.   Goal of Therapy:  Heparin level 0.3-0.7 units/ml Monitor platelets by anticoagulation protocol: Yes    Plan:  - Continue heparin gtt to 750 units/hr - Check 8 hr HL - Daily HL / CBC - F/U plan to transition to PO anticoagulation    91, PharmD., BCPS Clinical Pharmacist Pager [phone]

## 2015-11-24 NOTE — Progress Notes (Signed)
  Echocardiogram 2D Echocardiogram has been performed.  Delcie Roch 11/24/2015, 4:17 PM

## 2015-11-24 NOTE — Progress Notes (Signed)
ANTICOAGULATION CONSULT NOTE - Follow Up Consult  Pharmacy Consult:  Heparin Indication: atrial fibrillation  Allergies  Allergen Reactions  . Penicillins     Patient Measurements: Height: 5\' 6"  (167.6 cm) Weight: 189 lb 1.6 oz (85.775 kg) IBW/kg (Calculated) : 59.3 Heparin Dosing Weight: 77 kg  Vital Signs: Temp: 98.5 F (36.9 C) (03/15 0630) Temp Source: Oral (03/15 0630) BP: 110/66 mmHg (03/15 0630) Pulse Rate: 100 (03/15 0630)  Labs:  Recent Labs  11/22/15 1659 11/23/15 0109 11/24/15 0150 11/24/15 0320 11/24/15 1024  HGB 12.0 11.0*  --  11.3*  --   HCT 36.8 34.1*  --  35.5*  --   PLT 209 217  --  223  --   HEPARINUNFRC  --   --  0.84*  --  0.81*  CREATININE 1.63* 1.65*  --  1.77*  --   TROPONINI  --  <0.03  --   --   --     Estimated Creatinine Clearance: 26.6 mL/min (by C-G formula based on Cr of 1.77).     Assessment: 24 YOF with AFib to continue on IV heparin.  Heparin level is supra-therapeutic despite rate adjustments.  No bleeding reported.   Goal of Therapy:  Heparin level 0.3-0.7 units/ml Monitor platelets by anticoagulation protocol: Yes    Plan:  - Reduce heparin gtt to 750 units/hr - Check 8 hr HL - Daily HL / CBC - F/U plan to transition to PO anticoagulation    Theresa Cortez D. 10-18-1980, PharmD, BCPS Pager:  [phone] 11/24/2015, 11:30 AM

## 2015-11-24 NOTE — Progress Notes (Signed)
ANTICOAGULATION CONSULT NOTE - Follow Up Consult  Pharmacy Consult for heparin Indication: atrial fibrillation   Labs:  Recent Labs  11/22/15 1659 11/23/15 0109 11/24/15 0150  HGB 12.0 11.0*  --   HCT 36.8 34.1*  --   PLT 209 217  --   HEPARINUNFRC  --   --  0.84*  CREATININE 1.63* 1.65*  --   TROPONINI  --  <0.03  --     Assessment: 82yo female slightly above goal on heparin with initial dosing for Afib.  Goal of Therapy:  Heparin level 0.3-0.7 units/ml   Plan:  Will decrease heparin gtt slightly to 900 units/hr and check level in 8hr.  Vernard Gambles, PharmD, BCPS  11/24/2015,2:32 AM

## 2015-11-25 ENCOUNTER — Encounter (HOSPITAL_COMMUNITY)
Admission: EM | Disposition: A | Discharge status: Skilled Nursing Facility | Payer: Self-pay | Source: Home / Self Care | Attending: Internal Medicine

## 2015-11-25 ENCOUNTER — Inpatient Hospital Stay (HOSPITAL_COMMUNITY): Payer: Medicare Other

## 2015-11-25 ENCOUNTER — Encounter (HOSPITAL_COMMUNITY): Payer: Self-pay | Admitting: Cardiovascular Disease

## 2015-11-25 DIAGNOSIS — R791 Abnormal coagulation profile: Secondary | ICD-10-CM

## 2015-11-25 DIAGNOSIS — R945 Abnormal results of liver function studies: Secondary | ICD-10-CM

## 2015-11-25 DIAGNOSIS — I2510 Atherosclerotic heart disease of native coronary artery without angina pectoris: Secondary | ICD-10-CM

## 2015-11-25 DIAGNOSIS — R7989 Other specified abnormal findings of blood chemistry: Secondary | ICD-10-CM | POA: Insufficient documentation

## 2015-11-25 DIAGNOSIS — N179 Acute kidney failure, unspecified: Secondary | ICD-10-CM | POA: Insufficient documentation

## 2015-11-25 HISTORY — PX: CARDIAC CATHETERIZATION: SHX172

## 2015-11-25 LAB — COMPREHENSIVE METABOLIC PANEL
ALK PHOS: 151 U/L — AB (ref 38–126)
ALT: 208 U/L — AB (ref 14–54)
ANION GAP: 12 (ref 5–15)
AST: 80 U/L — ABNORMAL HIGH (ref 15–41)
Albumin: 2.7 g/dL — ABNORMAL LOW (ref 3.5–5.0)
BILIRUBIN TOTAL: 1.2 mg/dL (ref 0.3–1.2)
BUN: 20 mg/dL (ref 6–20)
CALCIUM: 8.6 mg/dL — AB (ref 8.9–10.3)
CO2: 30 mmol/L (ref 22–32)
CREATININE: 1.68 mg/dL — AB (ref 0.44–1.00)
Chloride: 97 mmol/L — ABNORMAL LOW (ref 101–111)
GFR, EST AFRICAN AMERICAN: 31 mL/min — AB (ref >60)
GFR, EST NON AFRICAN AMERICAN: 27 mL/min — AB (ref >60)
Glucose, Bld: 103 mg/dL — ABNORMAL HIGH (ref 65–99)
Potassium: 4.1 mmol/L (ref 3.5–5.1)
Sodium: 139 mmol/L (ref 135–145)
TOTAL PROTEIN: 4.9 g/dL — AB (ref 6.5–8.1)

## 2015-11-25 LAB — CBC
HEMATOCRIT: 32 % — AB (ref 36.0–46.0)
HEMOGLOBIN: 10.3 g/dL — AB (ref 12.0–15.0)
MCH: 30.5 pg (ref 26.0–34.0)
MCHC: 32.2 g/dL (ref 30.0–36.0)
MCV: 94.7 fL (ref 78.0–100.0)
Platelets: 193 10*3/uL (ref 150–400)
RBC: 3.38 MIL/uL — AB (ref 3.87–5.11)
RDW: 18.7 % — ABNORMAL HIGH (ref 11.5–15.5)
WBC: 5.4 10*3/uL (ref 4.0–10.5)

## 2015-11-25 LAB — POCT I-STAT 3, VENOUS BLOOD GAS (G3P V)
Acid-Base Excess: 6 mmol/L — ABNORMAL HIGH (ref 0.0–2.0)
Bicarbonate: 31.5 mEq/L — ABNORMAL HIGH (ref 20.0–24.0)
O2 Saturation: 47 %
PO2 VEN: 25 mmHg — AB (ref 31.0–45.0)
TCO2: 33 mmol/L (ref 0–100)
pCO2, Ven: 47.3 mmHg (ref 45.0–50.0)
pH, Ven: 7.431 — ABNORMAL HIGH (ref 7.250–7.300)

## 2015-11-25 LAB — GLUCOSE, CAPILLARY
GLUCOSE-CAPILLARY: 61 mg/dL — AB (ref 65–99)
GLUCOSE-CAPILLARY: 91 mg/dL (ref 65–99)
Glucose-Capillary: 91 mg/dL (ref 65–99)
Glucose-Capillary: 92 mg/dL (ref 65–99)

## 2015-11-25 LAB — POCT I-STAT 3, ART BLOOD GAS (G3+)
ACID-BASE EXCESS: 7 mmol/L — AB (ref 0.0–2.0)
BICARBONATE: 30.9 meq/L — AB (ref 20.0–24.0)
O2 SAT: 98 %
PO2 ART: 104 mmHg — AB (ref 80.0–100.0)
TCO2: 32 mmol/L (ref 0–100)
pCO2 arterial: 39.6 mmHg (ref 35.0–45.0)
pH, Arterial: 7.501 — ABNORMAL HIGH (ref 7.350–7.450)

## 2015-11-25 LAB — HEPARIN LEVEL (UNFRACTIONATED): Heparin Unfractionated: 0.63 IU/mL (ref 0.30–0.70)

## 2015-11-25 LAB — PROTIME-INR
INR: 1.56 — ABNORMAL HIGH (ref 0.00–1.49)
PROTHROMBIN TIME: 18.8 s — AB (ref 11.6–15.2)

## 2015-11-25 SURGERY — RIGHT/LEFT HEART CATH AND CORONARY ANGIOGRAPHY
Anesthesia: LOCAL

## 2015-11-25 MED ORDER — LIDOCAINE HCL (PF) 1 % IJ SOLN
INTRAMUSCULAR | Status: DC | PRN
  Administered 2015-11-25 (×2): 4 mL

## 2015-11-25 MED ORDER — SODIUM CHLORIDE 0.9 % IV SOLN: 250.0000 mL | INTRAVENOUS | Status: DC | PRN

## 2015-11-25 MED ORDER — VERAPAMIL HCL 2.5 MG/ML IV SOLN
INTRAVENOUS | Status: DC | PRN
  Administered 2015-11-25: 10 mL via INTRA_ARTERIAL

## 2015-11-25 MED ORDER — IOHEXOL 350 MG/ML SOLN
INTRAVENOUS | Status: DC | PRN
  Administered 2015-11-25: 50 mL via INTRA_ARTERIAL

## 2015-11-25 MED ORDER — VERAPAMIL HCL 2.5 MG/ML IV SOLN
INTRAVENOUS | Status: AC
  Filled 2015-11-25: qty 2

## 2015-11-25 MED ORDER — HEPARIN (PORCINE) IN NACL 2-0.9 UNIT/ML-% IJ SOLN
INTRAMUSCULAR | Status: AC
  Filled 2015-11-25: qty 1500

## 2015-11-25 MED ORDER — HYDROCODONE-ACETAMINOPHEN 5-325 MG PO TABS: 1.0000 | ORAL_TABLET | Freq: Four times a day (QID) | ORAL | Status: DC | PRN

## 2015-11-25 MED ORDER — SODIUM CHLORIDE 0.9% FLUSH
3.0000 mL | Freq: Two times a day (BID) | INTRAVENOUS | Status: DC
  Administered 2015-11-26 – 2015-11-27 (×3): 3 mL via INTRAVENOUS

## 2015-11-25 MED ORDER — MIDAZOLAM HCL 2 MG/2ML IJ SOLN
INTRAMUSCULAR | Status: DC | PRN
  Administered 2015-11-25: 1 mg via INTRAVENOUS

## 2015-11-25 MED ORDER — SODIUM CHLORIDE 0.9 % IV SOLN: INTRAVENOUS | Status: DC

## 2015-11-25 MED ORDER — HEPARIN (PORCINE) IN NACL 100-0.45 UNIT/ML-% IJ SOLN
700.0000 [IU]/h | INTRAMUSCULAR | Status: DC
  Administered 2015-11-25: 700 [IU]/h via INTRAVENOUS

## 2015-11-25 MED ORDER — NITROGLYCERIN 1 MG/10 ML FOR IR/CATH LAB
INTRA_ARTERIAL | Status: DC | PRN
  Administered 2015-11-25: 11:00:00

## 2015-11-25 MED ORDER — HEPARIN SODIUM (PORCINE) 1000 UNIT/ML IJ SOLN
INTRAMUSCULAR | Status: AC
  Filled 2015-11-25: qty 1

## 2015-11-25 MED ORDER — LIDOCAINE HCL (PF) 1 % IJ SOLN
INTRAMUSCULAR | Status: AC
  Filled 2015-11-25: qty 30

## 2015-11-25 MED ORDER — MIDAZOLAM HCL 2 MG/2ML IJ SOLN
INTRAMUSCULAR | Status: AC
  Filled 2015-11-25: qty 2

## 2015-11-25 MED ORDER — FUROSEMIDE 10 MG/ML IJ SOLN
80.0000 mg | Freq: Two times a day (BID) | INTRAMUSCULAR | Status: DC
  Administered 2015-11-25 – 2015-11-27 (×4): 80 mg via INTRAVENOUS
  Filled 2015-11-25 (×5): qty 8

## 2015-11-25 MED ORDER — HEPARIN (PORCINE) IN NACL 100-0.45 UNIT/ML-% IJ SOLN
700.0000 [IU]/h | INTRAMUSCULAR | Status: DC
  Administered 2015-11-25: 700 [IU]/h via INTRAVENOUS
  Filled 2015-11-25: qty 250

## 2015-11-25 MED ORDER — HEPARIN SODIUM (PORCINE) 5000 UNIT/ML IJ SOLN
5000.0000 [IU] | Freq: Three times a day (TID) | INTRAMUSCULAR | Status: DC
  Administered 2015-11-25: 5000 [IU] via SUBCUTANEOUS
  Filled 2015-11-25: qty 1

## 2015-11-25 MED ORDER — ASPIRIN 81 MG PO CHEW: 81.0000 mg | CHEWABLE_TABLET | ORAL | Status: DC

## 2015-11-25 MED ORDER — SODIUM CHLORIDE 0.9% FLUSH: 3.0000 mL | Freq: Two times a day (BID) | INTRAVENOUS | Status: DC

## 2015-11-25 MED ORDER — HEPARIN SODIUM (PORCINE) 1000 UNIT/ML IJ SOLN
INTRAMUSCULAR | Status: DC | PRN
  Administered 2015-11-25: 4000 [IU] via INTRAVENOUS

## 2015-11-25 MED ORDER — SODIUM CHLORIDE 0.9% FLUSH: 3.0000 mL | INTRAVENOUS | Status: DC | PRN

## 2015-11-25 MED ORDER — SODIUM CHLORIDE 0.9 % IV SOLN
INTRAVENOUS | Status: DC
  Administered 2015-11-25: 09:00:00 via INTRAVENOUS

## 2015-11-25 SURGICAL SUPPLY — 12 items
CATH BALLN WEDGE 5F 110CM (CATHETERS) ×1 IMPLANT
CATH OPTITORQUE JACKY 4.0 5F (CATHETERS) ×1 IMPLANT
COVER PRB 48X5XTLSCP FOLD TPE (BAG) IMPLANT
COVER PROBE 5X48 (BAG) ×2
DEVICE RAD COMP TR BAND LRG (VASCULAR PRODUCTS) ×2 IMPLANT
GLIDESHEATH SLEND SS 6F .021 (SHEATH) ×2 IMPLANT
KIT HEART LEFT (KITS) ×2 IMPLANT
PACK CARDIAC CATHETERIZATION (CUSTOM PROCEDURE TRAY) ×2 IMPLANT
TRANSDUCER W/STOPCOCK (MISCELLANEOUS) ×3 IMPLANT
TUBING CIL FLEX 10 FLL-RA (TUBING) ×2 IMPLANT
WIRE HI TORQ VERSACORE-J 145CM (WIRE) ×1 IMPLANT
WIRE SAFE-T 1.5MM-J .035X260CM (WIRE) ×1 IMPLANT

## 2015-11-25 NOTE — Care Management Important Message (Signed)
Important Message  Patient Details  Name: Theresa Cortez MRN: [id number] Date of Birth: [birth year]   Medicare Important Message Given:  Yes    Saliha Salts P Kalie Cabral 11/25/2015, 3:13 PM

## 2015-11-25 NOTE — Progress Notes (Signed)
Blood glucose is 61. 1 cup of orange juice has been given. Will recheck.

## 2015-11-25 NOTE — Progress Notes (Signed)
[facility] PROGRESS NOTE  BRYANNAH GOGUE ESL:[id number] DOB: 12/16/1929 DOA: 11/22/2015 PCP: Gwenyth Bender, MD  Subjective: Patient has persistent cough without SOB. She complains of mild back pain and significant weakness.  Assessment/Plan: Principal Problem: Acute decompensated systolic heart failure: Echo 3/15/207: Systolic HF; EF: 21-11%. Has mild chronic orthopnea at baseline but still denies shortness of breath. Continue IV Lasix. -6.5 since admission, foley placed 11/24/2015. Pedal edema resolving. Weight essentially unchanged from admission. Scheduled for left and right heart cath today. Continue ntg gtt, aspirin and amio.  Active Problems: Afib RVR: Cardiology planning to switch from Cardizem to amiodarone in a setting of acute CHF.  Continue IV Heparin-CHA2DS2-VASc Score is 5. Will transition to oral  anticoagulants once all procedures are complete.   ARF vs CKD stage 3: ?Etiology-SCr better today and volume overload resolving. On valsartan as outpatient. No proteinuria on UA. Renal ultrasound without hydronephrosis, normal echogenicity. Continue gentle diuresis, follow electrolytes closely - stable but Cl- trending down. Most recent records obtained from PCP's office do not contain patient's last BMET for SCr eval - will request most recent BMET today.   Elevated liver enzymes: likely congestive hepatitis given to the echo findings . RUQ ultrasound, hepatitis panel negative.LFTs essentially unchanged.  Cough: Persistent and non-productive. Distressful for patient. Likely secondary to postviral syndrome. Supportive care-Mucinex, incentive spirometry, nebulized bronchodilators. Do not see any indication for antibiotics-she already had 3 rounds of antibiotics as an outpatient.  Elevated d-dimer: Not hypoxic, not tachycardic. Without chest pain or shortness of breath. Low suspicion for pulmonary embolism. LE doppler negative for DVT, VQ scan negative for PE. Echo: no cardiac source.  Follow.  Hypothyroidism: TSH: WNL. Continue levothyroxine.  History of rheumatoid arthritis: Continue to hold methotrexate. Continue with Plaquenil for now - LFTs stable.  Hyperlipidemia: Lipitor held d/t elevated LFTs. Resume statin when able.   Type 2 diabetes: Continue to hold oral hypoglycemics. Had one episode of hypoglycemia 11/23/2015. CBGs otherwise stable with SSI.Likely will need adjustment of oral hypoglycemics on discharge-given renal function  Hypertension: Normotensive currently. Valsartan on hold.  Code Status: Full Family Communication: Daughter at bedside Disposition Plan: Home when ready. DVT prophylaxis: Heparin   Consultants: Cardiology  Procedures: None  Antibiotics: None Objective: Filed Vitals:   11/24/15 2053 11/25/15 0613  BP: 123/69 97/50  Pulse: 109 92  Temp: 101.4 F (38.6 C) 98.4 F (36.9 C)  Resp: 18 18    Intake/Output Summary (Last 24 hours) at 11/25/15 0838 Last data filed at 11/25/15 0700  Gross per 24 hour  Intake    120 ml  Output   3700 ml  Net  -3580 ml   Filed Weights   11/23/15 0426 11/24/15 0630 11/25/15 0613  Weight: 84.4 kg (186 lb 1.1 oz) 85.775 kg (189 lb 1.6 oz) 85.594 kg (188 lb 11.2 oz)    Exam:   General: WDWN, appears uncomfortable and weak.  Cardiovascular: S1, S2, S3. +1 pitting edema to the knee.  Respiratory: Bilateral wheezes R>L - but some wheezing originating in the throat. Diffuse rhonchi throughout. No consolidation noted.   Abdomen: Soft, NT, ND. +BS.  Musculoskeletal: Moves all limbs against gravity; not as strong today as she was yesterday.  Data Reviewed: Basic Metabolic Panel:  Recent Labs Lab 11/22/15 1659 11/23/15 0109 11/24/15 0320 11/25/15 0420  NA 137 137 137 139  K 4.9 4.6 4.6 4.1  CL 102 101 100* 97*  CO2 21* 24 26 30   GLUCOSE 208* 205* 145* 103*  BUN  29* 27* 29* 20  CREATININE 1.63* 1.65* 1.77* 1.68*  CALCIUM 9.6 9.3 8.8* 8.6*   Liver Function Tests:  Recent  Labs Lab 11/22/15 1659 11/23/15 0109 11/24/15 0320 11/25/15 0420  AST 178* 133* 125* 80*  ALT 273* 257* 273* 208*  ALKPHOS 252* 231* 194* 151*  BILITOT 1.4* 0.9 1.3* 1.2  PROT 6.5 5.9* 5.5* 4.9*  ALBUMIN 3.5 3.3* 2.9* 2.7*   No results for input(s): LIPASE, AMYLASE in the last 168 hours. No results for input(s): AMMONIA in the last 168 hours. CBC:  Recent Labs Lab 11/22/15 1659 11/23/15 0109 11/24/15 0320 11/25/15 0420  WBC 7.8 8.1 7.2 5.4  NEUTROABS  --  5.3  --   --   HGB 12.0 11.0* 11.3* 10.3*  HCT 36.8 34.1* 35.5* 32.0*  MCV 94.8 94.5 95.4 94.7  PLT 209 217 223 193   Cardiac Enzymes:  Recent Labs Lab 11/23/15 0109  TROPONINI <0.03   BNP (last 3 results)  Recent Labs  11/22/15 2052  BNP 1732.2*    ProBNP (last 3 results) No results for input(s): PROBNP in the last 8760 hours.  CBG:  Recent Labs Lab 11/24/15 0626 11/24/15 1132 11/24/15 1656 11/24/15 2052 11/25/15 0612  GLUCAP 140* 141* 109* 74 91    No results found for this or any previous visit (from the past 240 hour(s)).   Studies: US Renal  11/23/2015  CLINICAL DATA:  Acute renal failure EXAM: RENAL / URINARY TRACT ULTRASOUND COMPLETE COMPARISON:  01/27/2011 FINDINGS: Right Kidney: Length: 10.9 cm. Normal echogenicity with no mass or hydronephrosis. 9 x 5 x 5 mm exophytic lower pole cyst. Left Kidney: Length: 9.7 cm. Echogenicity within normal limits. No mass or hydronephrosis visualized. Bladder: Appears normal for degree of bladder distention. IMPRESSION: No significant abnormalities Electronically Signed   By: Esperanza Heir M.D.   On: 11/23/2015 12:21   Nm Pulmonary Perf And Vent  11/23/2015  CLINICAL DATA:  82 year old female with progressive shortness of breath cough and mid scapular pain. Abnormal D-dimer. Initial encounter. EXAM: NUCLEAR MEDICINE VENTILATION - PERFUSION LUNG SCAN TECHNIQUE: Ventilation images were obtained in multiple projections using inhaled aerosol Tc-57m DTPA.  Perfusion images were obtained in multiple projections after intravenous injection of Tc-62m MAA. RADIOPHARMACEUTICALS:  32.0 Technetium-61m DTPA aerosol inhalation and 4.3 Technetium-33m MAA IV COMPARISON:  PA and lateral chest radiographs 11/22/2015. FINDINGS: Ventilation: No ventilation defect identified. Photopenia related to cardiomegaly noted. Perfusion: Homogeneous perfusion radiotracer activity. No wedge shaped peripheral perfusion defects to suggest acute pulmonary embolism. IMPRESSION: Normal VQ scan. Electronically Signed   By: Odessa Fleming M.D.   On: 11/23/2015 16:07   US Abdomen Limited Ruq  11/23/2015  CLINICAL DATA:  Elevated liver function tests EXAM: US ABDOMEN LIMITED - RIGHT UPPER QUADRANT COMPARISON:  12/28/2004 FINDINGS: Gallbladder: No gallstones or wall thickening visualized. No sonographic Murphy sign noted by sonographer. Common bile duct: Diameter: 3 mm Liver: No focal lesion identified. Within normal limits in parenchymal echogenicity. IMPRESSION: Normal right upper quadrant ultrasound Electronically Signed   By: Esperanza Heir M.D.   On: 11/23/2015 12:18    Scheduled Meds: . aspirin  81 mg Oral Daily  . [START ON 11/26/2015] aspirin  81 mg Oral Pre-Cath  . diclofenac sodium  4 g Topical QID  . folic acid  1 mg Oral Daily  . furosemide  80 mg Intravenous Q12H  . guaiFENesin  1,200 mg Oral BID  . hydroxychloroquine  200 mg Oral BID  . insulin aspart  0-5 Units  Subcutaneous QHS  . insulin aspart  0-9 Units Subcutaneous TID WC  . ipratropium-albuterol  3 mL Nebulization BID  . latanoprost  1 drop Both Eyes QHS  . levothyroxine  75 mcg Oral QAC breakfast  . loratadine  10 mg Oral Daily  . sodium chloride flush  3 mL Intravenous Q12H  . sodium chloride flush  3 mL Intravenous Q12H   Continuous Infusions: . sodium chloride    . heparin    . nitroGLYCERIN 10 mcg/min (11/24/15 2000)    Principal Problem:   Acute congestive heart failure (HCC) Active Problems:    Hypertension   DM (diabetes mellitus) (HCC)   AKI (acute kidney injury) (HCC)   Acute combined systolic and diastolic heart failure (HCC)   Mitral regurgitation   Tricuspid regurgitation   Pulmonary hypertension (HCC)   Time spent 30 minutes-Greater than 50% of this time was spent in counseling, explanation of diagnosis, planning of further management, and coordination of care.  Mariana Single  [facility] If 7PM-7AM, please contact night-coverage at [email or web address], password [facility] 11/25/2015, 8:38 AM  LOS: 3 days    Attending MD note  Patient was seen, examined,treatment plan was discussed with the PA-S.  I have personally reviewed the clinical findings, lab, imaging studies and management of this patient in detail. I agree with the documentation, as recorded by the PA-S.   Edema decreased, renal function relatively stable, echo demonstrates significantly reduced EF around 25-30%, with numerous wall motion abnormalities. Also shows moderate to severe MR. Cardiology planning on left heart catheterization today.  Rest as above.   [facility]

## 2015-11-25 NOTE — Interval H&P Note (Signed)
History and Physical Interval Note:  11/25/2015 10:24 AM  Theresa Cortez  has presented today for surgery, with the diagnosis of heart failure  The various methods of treatment have been discussed with the patient and family. After consideration of risks, benefits and other options for treatment, the patient has consented to  Procedure(s): Right/Left Heart Cath and Coronary Angiography (N/A) as a surgical intervention .  The patient's history has been reviewed, patient examined, no change in status, stable for surgery.  I have reviewed the patient's chart and labs.  Questions were answered to the patient's satisfaction.     Lorine Bears

## 2015-11-25 NOTE — Progress Notes (Addendum)
ANTICOAGULATION CONSULT NOTE - Follow Up Consult  Pharmacy Consult:  Heparin Indication: atrial fibrillation  Allergies  Allergen Reactions  . Penicillins     Patient Measurements: Height: 5\' 6"  (167.6 cm) Weight: 188 lb 11.2 oz (85.594 kg) IBW/kg (Calculated) : 59.3 Heparin Dosing Weight: 77 kg  Vital Signs: Temp: 98.4 F (36.9 C) (03/16 0613) Temp Source: Oral (03/16 0613) BP: 97/50 mmHg (03/16 0613) Pulse Rate: 92 (03/16 0613)  Labs:  Recent Labs  11/23/15 0109  11/24/15 0320 11/24/15 1024 11/24/15 2020 11/25/15 0420  HGB 11.0*  --  11.3*  --   --  10.3*  HCT 34.1*  --  35.5*  --   --  32.0*  PLT 217  --  223  --   --  193  LABPROT  --   --   --   --   --  18.8*  INR  --   --   --   --   --  1.56*  HEPARINUNFRC  --   < >  --  0.81* 0.67 0.63  CREATININE 1.65*  --  1.77*  --   --  1.68*  TROPONINI <0.03  --   --   --   --   --   < > = values in this interval not displayed.  Estimated Creatinine Clearance: 28 mL/min (by C-G formula based on Cr of 1.68).     Assessment: 39 YOF with AFib to continue on IV heparin.  Heparin level is therapeutic; no bleeding reported.  Noted plan for cath.   Goal of Therapy:  Heparin level 0.3-0.7 units/ml Monitor platelets by anticoagulation protocol: Yes    Plan:  - Reduce heparin gtt slightly to 700 units/hr - Daily HL / CBC - F/U post cath - Watch fever curve    Rakeisha Nyce D. 91, PharmD, BCPS Pager:  [phone] 11/25/2015, 7:37 AM    ====================================   Addendum: - s/p cath and heparin to resume 8 hrs post sheath removal per Dr. 11/27/2015 - sheath pulled around 1100; no bleeding nor hematoma per RN   Plan: - D/C heparin SQ - At 1900, resume heparin gtt at 700 units/hr - Check 8 hr HL - Daily HL / CB   Ginnette Gates D. Kirke Corin, PharmD, BCPS Pager:  [phone] 11/25/2015, 2:46 PM

## 2015-11-25 NOTE — H&P (View-Only) (Signed)
Patient Name: Theresa Cortez Date of Encounter: 11/25/2015    Primary Cardiologist: Dr. Royann Shivers Patient Profile: Theresa Cortez is a 82 y/o female with no prior cardiac history, admitted for new onset CHF, following a 3 week history of progressive dyspnea, cough, LEE and mid scapular pain. She has a PMH of HTN, DM, RA, and hypothyroidism. She [facility]/22[facility]15 with cough, back pain, orthopnea, and bilateral leg edema. Her D- dimer was elevated at 2.19 (VQ scan normal), and BNP was 1732. Cardiology was consulted for evaluation of CHF.    SUBJECTIVE: Says her back is still hurting, she is tired.  Denies chest pain.   OBJECTIVE Filed Vitals:   11/24/15 1345 11/24/15 2040 11/24/15 2053 11/25/15 0613  BP: 115/58  123/69 97/50  Pulse: 105  109 92  Temp: 97.9 F (36.6 C)  101.4 F (38.6 C) 98.4 F (36.9 C)  TempSrc: Oral  Oral Oral  Resp: 18  18 18   Height:      Weight:    188 lb 11.2 oz (85.594 kg)  SpO2: 96% 99% 98% 98%    Intake/Output Summary (Last 24 hours) at 11/25/15 0813 Last data filed at 11/25/15 0700  Gross per 24 hour  Intake    120 ml  Output   3700 ml  Net  -3580 ml   Filed Weights   11/23/15 0426 11/24/15 0630 11/25/15 11/27/15  Weight: 186 lb 1.1 oz (84.4 kg) 189 lb 1.6 oz (85.775 kg) 188 lb 11.2 oz (85.594 kg)    PHYSICAL EXAM General: Well developed, well nourished, female in no acute distress. Head: Normocephalic, atraumatic.  Neck: Supple without bruits, No JVD. Lungs:  Resp regular and unlabored, CTA. Heart: RRR, S1, S2, no S3, S4, No murmur; no rub. Abdomen: Soft, non-tender, non-distended, BS + x 4.  Extremities: No clubbing, cyanosis, No edema.  Neuro: Alert and oriented X 3. Moves all extremities spontaneously. Psych: Normal affect.  LABS: CBC: Recent Labs  11/23/15 0109 11/24/15 0320 11/25/15 0420  WBC 8.1 7.2 5.4  NEUTROABS 5.3  --   --   HGB 11.0* 11.3* 10.3*  HCT 34.1* 35.5* 32.0*  MCV 94.5 95.4 94.7  PLT 217 223 193     INR: Recent Labs  11/25/15 0420  INR 1.56*   Basic Metabolic Panel: Recent Labs  11/24/15 0320 11/25/15 0420  NA 137 139  K 4.6 4.1  CL 100* 97*  CO2 26 30  GLUCOSE 145* 103*  BUN 29* 20  CREATININE 1.77* 1.68*  CALCIUM 8.8* 8.6*   Liver Function Tests: Recent Labs  11/24/15 0320 11/25/15 0420  AST 125* 80*  ALT 273* 208*  ALKPHOS 194* 151*  BILITOT 1.3* 1.2  PROT 5.5* 4.9*  ALBUMIN 2.9* 2.7*   Cardiac Enzymes: Recent Labs  11/23/15 0109  TROPONINI <0.03   BNP:  B NATRIURETIC PEPTIDE  Date/Time Value Ref Range Status  11/22/2015 08:52 PM 1732.2* 0.0 - 100.0 pg/mL Final   D-dimer: Recent Labs  11/23/15 0109  DDIMER 2.19*   Hemoglobin A1C: Recent Labs  11/23/15 0109  HGBA1C 7.3*   Thyroid Function Tests: Recent Labs  11/23/15 0109  TSH 1.242    TELE: Afib        ECG: Afib w/LBBB  Echo: - Left ventricle: The cavity size was mildly dilated. Wall  thickness was normal. Systolic function was severely reduced. The  estimated ejection fraction was in the range of 25% to 30%.  Akinesis of the inferior  myocardium. Hypokinesis of the  anteroseptal and apical myocardium. - Aortic valve: There was mild regurgitation. - Mitral valve: Calcified annulus. There was moderate to severe  regurgitation directed posteriorly. - Left atrium: The atrium was mildly dilated. - Right ventricle: Systolic function was mildly reduced. - Right atrium: The atrium was mildly dilated. - Tricuspid valve: There was severe regurgitation directed  centrally. There was moderate-severe perivalvular regurgitation. - Pulmonary arteries: Systolic pressure was moderately increased.  PA peak pressure: 53 mm Hg (S).    Radiology/Studies: US Renal  11/23/2015  CLINICAL DATA:  Acute renal failure EXAM: RENAL / URINARY TRACT ULTRASOUND COMPLETE COMPARISON:  01/27/2011 FINDINGS: Right Kidney: Length: 10.9 cm. Normal echogenicity with no mass or hydronephrosis. 9 x 5 x  5 mm exophytic lower pole cyst. Left Kidney: Length: 9.7 cm. Echogenicity within normal limits. No mass or hydronephrosis visualized. Bladder: Appears normal for degree of bladder distention. IMPRESSION: No significant abnormalities Electronically Signed   By: Esperanza Heir M.D.   On: 11/23/2015 12:21   Nm Pulmonary Perf And Vent  11/23/2015  CLINICAL DATA:  82 year old female with progressive shortness of breath cough and mid scapular pain. Abnormal D-dimer. Initial encounter. EXAM: NUCLEAR MEDICINE VENTILATION - PERFUSION LUNG SCAN TECHNIQUE: Ventilation images were obtained in multiple projections using inhaled aerosol Tc-4m DTPA. Perfusion images were obtained in multiple projections after intravenous injection of Tc-77m MAA. RADIOPHARMACEUTICALS:  32.0 Technetium-45m DTPA aerosol inhalation and 4.3 Technetium-70m MAA IV COMPARISON:  PA and lateral chest radiographs 11/22/2015. FINDINGS: Ventilation: No ventilation defect identified. Photopenia related to cardiomegaly noted. Perfusion: Homogeneous perfusion radiotracer activity. No wedge shaped peripheral perfusion defects to suggest acute pulmonary embolism. IMPRESSION: Normal VQ scan. Electronically Signed   By: Odessa Fleming M.D.   On: 11/23/2015 16:07   US Abdomen Limited Ruq  11/23/2015  CLINICAL DATA:  Elevated liver function tests EXAM: US ABDOMEN LIMITED - RIGHT UPPER QUADRANT COMPARISON:  12/28/2004 FINDINGS: Gallbladder: No gallstones or wall thickening visualized. No sonographic Murphy sign noted by sonographer. Common bile duct: Diameter: 3 mm Liver: No focal lesion identified. Within normal limits in parenchymal echogenicity. IMPRESSION: Normal right upper quadrant ultrasound Electronically Signed   By: Esperanza Heir M.D.   On: 11/23/2015 12:18     Current Medications:  . aspirin  81 mg Oral Daily  . [START ON 11/26/2015] aspirin  81 mg Oral Pre-Cath  . diclofenac sodium  4 g Topical QID  . folic acid  1 mg Oral Daily  . furosemide  40  mg Intravenous Q12H  . guaiFENesin  1,200 mg Oral BID  . hydroxychloroquine  200 mg Oral BID  . insulin aspart  0-5 Units Subcutaneous QHS  . insulin aspart  0-9 Units Subcutaneous TID WC  . ipratropium-albuterol  3 mL Nebulization BID  . latanoprost  1 drop Both Eyes QHS  . levothyroxine  75 mcg Oral QAC breakfast  . loratadine  10 mg Oral Daily  . sodium chloride flush  3 mL Intravenous Q12H  . sodium chloride flush  3 mL Intravenous Q12H   . sodium chloride    . heparin    . nitroGLYCERIN 10 mcg/min (11/24/15 2000)    ASSESSMENT AND PLAN: Principal Problem:   Acute congestive heart failure (HCC) Active Problems:   Hypertension   DM (diabetes mellitus) (HCC)   AKI (acute kidney injury) (HCC)   Acute combined systolic and diastolic heart failure (HCC)   Mitral regurgitation   Tricuspid regurgitation   Pulmonary hypertension (HCC)  1. Acute  Systolic CHF - Right and left heart cath today  - On IV Nitro for vasodilation.  - Avoid RAAS inhibitors until after cath.  - Cr is 1.68 today, cannot tolerate hydration pre cath.  - IV heparin  2. Acute kidney injury - Avoid NSAIDS - Cr is down today.   3. PAF  - IV diltiazem changed to Amio in setting of acute CHF.   Signed, Little Ishikawa , NP 8:13 AM 11/25/2015  I have seen and examined the patient along with Little Ishikawa , NP.  I have reviewed the chart, notes and new data.  I agree with NP's note.  Key new complaints: still dyspneic, no angina Key examination changes:  Wheezing bilaterally, 2-3/6 holosystolic murmur, S3, prominent v waves Key new findings / data: creatinine stable/slightly better (baseline uncertain, last creat before this admission was in 2013)  PLAN: Increase NTG iv. Diuresis. Hold RAAS inh until after cath today. Continue heparin until after cath +/- evaluation for surgery  Thurmon Fair, MD, [facility] [phone] 11/25/2015, 8:35 AM

## 2015-11-25 NOTE — Progress Notes (Signed)
Patient Name: Theresa Cortez Date of Encounter: 11/25/2015    Primary Cardiologist: Dr. Royann Shivers Patient Profile: Theresa Cortez is a 82 y/o female with no prior cardiac history, admitted for new onset CHF, following a 3 week history of progressive dyspnea, cough, LEE and mid scapular pain. She has a PMH of HTN, DM, RA, and hypothyroidism. She presented to [facility] on 11/22/15 with cough, back pain, orthopnea, and bilateral leg edema. Her D- dimer was elevated at 2.19 (VQ scan normal), and BNP was 1732. Cardiology was consulted for evaluation of CHF.    SUBJECTIVE: Says her back is still hurting, she is tired.  Denies chest pain.   OBJECTIVE Filed Vitals:   11/24/15 1345 11/24/15 2040 11/24/15 2053 11/25/15 0613  BP: 115/58  123/69 97/50  Pulse: 105  109 92  Temp: 97.9 F (36.6 C)  101.4 F (38.6 C) 98.4 F (36.9 C)  TempSrc: Oral  Oral Oral  Resp: 18  18 18   Height:      Weight:    188 lb 11.2 oz (85.594 kg)  SpO2: 96% 99% 98% 98%    Intake/Output Summary (Last 24 hours) at 11/25/15 0813 Last data filed at 11/25/15 0700  Gross per 24 hour  Intake    120 ml  Output   3700 ml  Net  -3580 ml   Filed Weights   11/23/15 0426 11/24/15 0630 11/25/15 11/27/15  Weight: 186 lb 1.1 oz (84.4 kg) 189 lb 1.6 oz (85.775 kg) 188 lb 11.2 oz (85.594 kg)    PHYSICAL EXAM General: Well developed, well nourished, female in no acute distress. Head: Normocephalic, atraumatic.  Neck: Supple without bruits, No JVD. Lungs:  Resp regular and unlabored, CTA. Heart: RRR, S1, S2, no S3, S4, No murmur; no rub. Abdomen: Soft, non-tender, non-distended, BS + x 4.  Extremities: No clubbing, cyanosis, No edema.  Neuro: Alert and oriented X 3. Moves all extremities spontaneously. Psych: Normal affect.  LABS: CBC: Recent Labs  11/23/15 0109 11/24/15 0320 11/25/15 0420  WBC 8.1 7.2 5.4  NEUTROABS 5.3  --   --   HGB 11.0* 11.3* 10.3*  HCT 34.1* 35.5* 32.0*  MCV 94.5 95.4 94.7  PLT 217 223 193     INR: Recent Labs  11/25/15 0420  INR 1.56*   Basic Metabolic Panel: Recent Labs  11/24/15 0320 11/25/15 0420  NA 137 139  K 4.6 4.1  CL 100* 97*  CO2 26 30  GLUCOSE 145* 103*  BUN 29* 20  CREATININE 1.77* 1.68*  CALCIUM 8.8* 8.6*   Liver Function Tests: Recent Labs  11/24/15 0320 11/25/15 0420  AST 125* 80*  ALT 273* 208*  ALKPHOS 194* 151*  BILITOT 1.3* 1.2  PROT 5.5* 4.9*  ALBUMIN 2.9* 2.7*   Cardiac Enzymes: Recent Labs  11/23/15 0109  TROPONINI <0.03   BNP:  B NATRIURETIC PEPTIDE  Date/Time Value Ref Range Status  11/22/2015 08:52 PM 1732.2* 0.0 - 100.0 pg/mL Final   D-dimer: Recent Labs  11/23/15 0109  DDIMER 2.19*   Hemoglobin A1C: Recent Labs  11/23/15 0109  HGBA1C 7.3*   Thyroid Function Tests: Recent Labs  11/23/15 0109  TSH 1.242    TELE: Afib        ECG: Afib w/LBBB  Echo: - Left ventricle: The cavity size was mildly dilated. Wall  thickness was normal. Systolic function was severely reduced. The  estimated ejection fraction was in the range of 25% to 30%.  Akinesis of the inferior  myocardium. Hypokinesis of the  anteroseptal and apical myocardium. - Aortic valve: There was mild regurgitation. - Mitral valve: Calcified annulus. There was moderate to severe  regurgitation directed posteriorly. - Left atrium: The atrium was mildly dilated. - Right ventricle: Systolic function was mildly reduced. - Right atrium: The atrium was mildly dilated. - Tricuspid valve: There was severe regurgitation directed  centrally. There was moderate-severe perivalvular regurgitation. - Pulmonary arteries: Systolic pressure was moderately increased.  PA peak pressure: 53 mm Hg (S).    Radiology/Studies: US Renal  11/23/2015  CLINICAL DATA:  Acute renal failure EXAM: RENAL / URINARY TRACT ULTRASOUND COMPLETE COMPARISON:  01/27/2011 FINDINGS: Right Kidney: Length: 10.9 cm. Normal echogenicity with no mass or hydronephrosis. 9 x 5 x  5 mm exophytic lower pole cyst. Left Kidney: Length: 9.7 cm. Echogenicity within normal limits. No mass or hydronephrosis visualized. Bladder: Appears normal for degree of bladder distention. IMPRESSION: No significant abnormalities Electronically Signed   By: Esperanza Heir M.D.   On: 11/23/2015 12:21   Nm Pulmonary Perf And Vent  11/23/2015  CLINICAL DATA:  82 year old female with progressive shortness of breath cough and mid scapular pain. Abnormal D-dimer. Initial encounter. EXAM: NUCLEAR MEDICINE VENTILATION - PERFUSION LUNG SCAN TECHNIQUE: Ventilation images were obtained in multiple projections using inhaled aerosol Tc-4m DTPA. Perfusion images were obtained in multiple projections after intravenous injection of Tc-77m MAA. RADIOPHARMACEUTICALS:  32.0 Technetium-45m DTPA aerosol inhalation and 4.3 Technetium-70m MAA IV COMPARISON:  PA and lateral chest radiographs 11/22/2015. FINDINGS: Ventilation: No ventilation defect identified. Photopenia related to cardiomegaly noted. Perfusion: Homogeneous perfusion radiotracer activity. No wedge shaped peripheral perfusion defects to suggest acute pulmonary embolism. IMPRESSION: Normal VQ scan. Electronically Signed   By: Odessa Fleming M.D.   On: 11/23/2015 16:07   US Abdomen Limited Ruq  11/23/2015  CLINICAL DATA:  Elevated liver function tests EXAM: US ABDOMEN LIMITED - RIGHT UPPER QUADRANT COMPARISON:  12/28/2004 FINDINGS: Gallbladder: No gallstones or wall thickening visualized. No sonographic Murphy sign noted by sonographer. Common bile duct: Diameter: 3 mm Liver: No focal lesion identified. Within normal limits in parenchymal echogenicity. IMPRESSION: Normal right upper quadrant ultrasound Electronically Signed   By: Esperanza Heir M.D.   On: 11/23/2015 12:18     Current Medications:  . aspirin  81 mg Oral Daily  . [START ON 11/26/2015] aspirin  81 mg Oral Pre-Cath  . diclofenac sodium  4 g Topical QID  . folic acid  1 mg Oral Daily  . furosemide  40  mg Intravenous Q12H  . guaiFENesin  1,200 mg Oral BID  . hydroxychloroquine  200 mg Oral BID  . insulin aspart  0-5 Units Subcutaneous QHS  . insulin aspart  0-9 Units Subcutaneous TID WC  . ipratropium-albuterol  3 mL Nebulization BID  . latanoprost  1 drop Both Eyes QHS  . levothyroxine  75 mcg Oral QAC breakfast  . loratadine  10 mg Oral Daily  . sodium chloride flush  3 mL Intravenous Q12H  . sodium chloride flush  3 mL Intravenous Q12H   . sodium chloride    . heparin    . nitroGLYCERIN 10 mcg/min (11/24/15 2000)    ASSESSMENT AND PLAN: Principal Problem:   Acute congestive heart failure (HCC) Active Problems:   Hypertension   DM (diabetes mellitus) (HCC)   AKI (acute kidney injury) (HCC)   Acute combined systolic and diastolic heart failure (HCC)   Mitral regurgitation   Tricuspid regurgitation   Pulmonary hypertension (HCC)  1. Acute  Systolic CHF - Right and left heart cath today  - On IV Nitro for vasodilation.  - Avoid RAAS inhibitors until after cath.  - Cr is 1.68 today, cannot tolerate hydration pre cath.  - IV heparin  2. Acute kidney injury - Avoid NSAIDS - Cr is down today.   3. PAF  - IV diltiazem changed to Amio in setting of acute CHF.   Signed, Erin E Smith , NP 8:13 AM 11/25/2015  I have seen and examined the patient along with Erin E Smith , NP.  I have reviewed the chart, notes and new data.  I agree with NP's note.  Key new complaints: still dyspneic, no angina Key examination changes:  Wheezing bilaterally, 2-3/6 holosystolic murmur, S3, prominent v waves Key new findings / data: creatinine stable/slightly better (baseline uncertain, last creat before this admission was in 2013)  PLAN: Increase NTG iv. Diuresis. Hold RAAS inh until after cath today. Continue heparin until after cath +/- evaluation for surgery  Jilleen Essner, MD, FACC [facility] [phone] 11/25/2015, 8:35 AM  

## 2015-11-26 LAB — CBC
HEMATOCRIT: 33.8 % — AB (ref 36.0–46.0)
HEMOGLOBIN: 10.9 g/dL — AB (ref 12.0–15.0)
MCH: 30.7 pg (ref 26.0–34.0)
MCHC: 32.2 g/dL (ref 30.0–36.0)
MCV: 95.2 fL (ref 78.0–100.0)
Platelets: 180 10*3/uL (ref 150–400)
RBC: 3.55 MIL/uL — ABNORMAL LOW (ref 3.87–5.11)
RDW: 18.7 % — AB (ref 11.5–15.5)
WBC: 5.2 10*3/uL (ref 4.0–10.5)

## 2015-11-26 LAB — BASIC METABOLIC PANEL
ANION GAP: 12 (ref 5–15)
BUN: 18 mg/dL (ref 6–20)
CALCIUM: 8.4 mg/dL — AB (ref 8.9–10.3)
CHLORIDE: 91 mmol/L — AB (ref 101–111)
CO2: 30 mmol/L (ref 22–32)
CREATININE: 1.92 mg/dL — AB (ref 0.44–1.00)
GFR calc non Af Amer: 23 mL/min — ABNORMAL LOW (ref >60)
GFR, EST AFRICAN AMERICAN: 27 mL/min — AB (ref >60)
Glucose, Bld: 93 mg/dL (ref 65–99)
Potassium: 4 mmol/L (ref 3.5–5.1)
SODIUM: 133 mmol/L — AB (ref 135–145)

## 2015-11-26 LAB — GLUCOSE, CAPILLARY
GLUCOSE-CAPILLARY: 81 mg/dL (ref 65–99)
GLUCOSE-CAPILLARY: 79 mg/dL (ref 65–99)
GLUCOSE-CAPILLARY: 93 mg/dL (ref 65–99)
Glucose-Capillary: 142 mg/dL — ABNORMAL HIGH (ref 65–99)

## 2015-11-26 LAB — HEPARIN LEVEL (UNFRACTIONATED): HEPARIN UNFRACTIONATED: 0.67 [IU]/mL (ref 0.30–0.70)

## 2015-11-26 MED ORDER — AMIODARONE LOAD VIA INFUSION
150.0000 mg | Freq: Once | INTRAVENOUS | Status: DC
  Filled 2015-11-26: qty 83.34

## 2015-11-26 MED ORDER — AMIODARONE HCL IN DEXTROSE 360-4.14 MG/200ML-% IV SOLN: 60.0000 mg/h | INTRAVENOUS | Status: DC

## 2015-11-26 MED ORDER — APIXABAN 2.5 MG PO TABS
2.5000 mg | ORAL_TABLET | Freq: Two times a day (BID) | ORAL | Status: DC
  Administered 2015-11-26 – 2015-11-30 (×9): 2.5 mg via ORAL
  Filled 2015-11-26 (×9): qty 1

## 2015-11-26 MED ORDER — ISOSORBIDE DINITRATE 5 MG PO TABS
5.0000 mg | ORAL_TABLET | Freq: Three times a day (TID) | ORAL | Status: DC
  Administered 2015-11-26 – 2015-11-27 (×2): 5 mg via ORAL
  Filled 2015-11-26 (×6): qty 1

## 2015-11-26 MED ORDER — AMIODARONE HCL 200 MG PO TABS
400.0000 mg | ORAL_TABLET | Freq: Two times a day (BID) | ORAL | Status: DC
  Administered 2015-11-26: 400 mg via ORAL
  Filled 2015-11-26: qty 2

## 2015-11-26 MED ORDER — AMIODARONE HCL IN DEXTROSE 360-4.14 MG/200ML-% IV SOLN: 30.0000 mg/h | INTRAVENOUS | Status: DC

## 2015-11-26 MED ORDER — DILTIAZEM HCL 100 MG IV SOLR
5.0000 mg/h | INTRAVENOUS | Status: DC
  Administered 2015-11-26: 5 mg/h via INTRAVENOUS
  Filled 2015-11-26: qty 100

## 2015-11-26 MED ORDER — HYDRALAZINE HCL 10 MG PO TABS
10.0000 mg | ORAL_TABLET | Freq: Four times a day (QID) | ORAL | Status: DC
  Administered 2015-11-27: 10 mg via ORAL
  Filled 2015-11-26 (×4): qty 1

## 2015-11-26 MED ORDER — AMIODARONE HCL 200 MG PO TABS: 400.0000 mg | ORAL_TABLET | Freq: Every day | ORAL | Status: DC

## 2015-11-26 MED ORDER — AMIODARONE HCL IN DEXTROSE 360-4.14 MG/200ML-% IV SOLN
60.0000 mg/h | INTRAVENOUS | Status: DC
  Filled 2015-11-26: qty 200

## 2015-11-26 MED ORDER — DILTIAZEM LOAD VIA INFUSION
10.0000 mg | Freq: Once | INTRAVENOUS | Status: DC
  Filled 2015-11-26: qty 10

## 2015-11-26 NOTE — Progress Notes (Signed)
ANTICOAGULATION CONSULT NOTE - Follow Up Consult  Pharmacy Consult for Heparin  Indication: atrial fibrillation, s/p cath  Allergies  Allergen Reactions  . Penicillins     Patient Measurements: Height: 5\' 6"  (167.6 cm) Weight: 188 lb 11.2 oz (85.594 kg) IBW/kg (Calculated) : 59.3  Vital Signs: Temp: 100.2 F (37.9 C) (03/16 2049) Temp Source: Oral (03/16 2049) BP: 114/78 mmHg (03/16 2049) Pulse Rate: 113 (03/16 2049)  Labs:  Recent Labs  11/24/15 0320  11/24/15 2020 11/25/15 0420 11/26/15 0420  HGB 11.3*  --   --  10.3* 10.9*  HCT 35.5*  --   --  32.0* 33.8*  PLT 223  --   --  193 180  LABPROT  --   --   --  18.8*  --   INR  --   --   --  1.56*  --   HEPARINUNFRC  --   < > 0.67 0.63 0.67  CREATININE 1.77*  --   --  1.68* 1.92*  < > = values in this interval not displayed.  Estimated Creatinine Clearance: 24.5 mL/min (by C-G formula based on Cr of 1.92).   Assessment: Heparin level therapeutic x 1 after re-start s/p cath  Goal of Therapy:  Heparin level 0.3-0.7 units/ml Monitor platelets by anticoagulation protocol: Yes   Plan:  -Cont heparin at 700 units/hr -1200 HL  Marelly Wehrman 11/26/2015,5:16 AM

## 2015-11-26 NOTE — Progress Notes (Addendum)
[facility] PROGRESS NOTE  Theresa Cortez LNL:[id number] DOB: [birth year] DOA: 11/22/2015 PCP: Gwenyth Bender, MD  Brief narrative: 82 year old female with history of diabetes, hypertension who presented with cough, shortness of breath. Further evaluation revealed EF around 25-30% and severe MR. Underwent left heart catheterization that showed nonobstructive CAD. Hospital course complicated by development of A. fib RVR. Currently improving with IV diuretics and rate control. See below for details.  Subjective: Much improved, decreased lower extremity edema and decreased cough.   Assessment/Plan: Principal Problem: Acute decompensated systolic heart failure: Much improved. Echo 3/15 showed EF 25-30% with moderate-severe mitral regurgitation. -11.6L so far, weight decreased to 169 lbs (weight on admit 186 lbs). Continue IV Lasix, hydralazine and Imdur- cardiology following  Active Problems: Afib RVR: Initially required IV Cardizem for rate control, started on IV Heparin-CHA2DS2-VASc Score is 5. Rate now much better, Cardizem infusion has been discontinued, started on amiodarone. Since no further invasive procedures are contemplated, we will discontinue IV heparin and start on Eliquis.  Severe MR: Per cardiology MR likely secondary to cardiomyopathy, plans are to try maximal medications first and repeat echocardiogram.   Acute on CKD stage 3: Likely has stage III CKD from either hypertensive nephrosclerosis or from cardiorenal syndrome. Mild ARF likely secondary to diuretics, and severe CHF. Did get some contrast with LHC yesterday which could accounted for elevated creatinine as well. Supportive care, continue to monitor monitor renal function.  Elevated liver enzymes: likely congestive hepatitis given to the echo findings . RUQ ultrasound, hepatitis panel negative. LFTs trending downward.  Cough: Persistent and non-productive. Distressful for patient. Likely secondary to postviral syndrome.  Supportive care-Mucinex, incentive spirometry, nebulized bronchodilators. Do not see any indication for antibiotics-she already had 3 rounds of antibiotics as an outpatient.  Elevated d-dimer:  LE doppler negative for DVT, VQ scan negative for PE. Since low probability for PE, no further investigation required, furthermore will be on long-term anticoagulation for atrial fibrillation.  Hypothyroidism: TSH: WNL. Continue levothyroxine.  History of rheumatoid arthritis: Continue to hold methotrexate. Continue with Plaquenil for now - LFTs looking better.  Hyperlipidemia: Lipitor held d/t elevated LFTs. Resume statin when able.   Type 2 diabetes: Continue to hold oral hypoglycemics. Had one episode of hypoglycemia 11/23/2015. CBGs otherwise stable with SSI.Likely will need adjustment of oral hypoglycemics on discharge-given renal function  Hypertension: BP controlled-now on imdur and hydralazine-follow.   Code Status: Full Family Communication: Daughter at bedside Disposition Plan: Home when ready. DVT prophylaxis:Eliquis   Consultants: Cardiology  Procedures: Left and right heart cath 11/25/2015  Antibiotics: None  Objective: Filed Vitals:   11/26/15 0521 11/26/15 0844  BP: 104/57   Pulse: 106 105  Temp: 98.3 F (36.8 C)   Resp: 20 20    Intake/Output Summary (Last 24 hours) at 11/26/15 1107 Last data filed at 11/26/15 0733  Gross per 24 hour  Intake 632.93 ml  Output   5151 ml  Net -4518.07 ml   Filed Weights   11/25/15 0613 11/26/15 0521 11/26/15 0528  Weight: 85.594 kg (188 lb 11.2 oz) 79.425 kg (175 lb 1.6 oz) 77.1 kg (169 lb 15.6 oz)    Exam:   General: WDWN, appears uncomfortable and weak.  Cardiovascular: S1, S2, S3. Minimal pitting edema on R leg, left leg: 1+ pitting edema  Respiratory: Bilateral wheezes R>L - much better than yesterday.   Abdomen: Soft, NT, ND. +BS.  Musculoskeletal: Moves all limbs against gravity  Data Reviewed: Basic Metabolic  Panel:  Recent Labs Lab  11/22/15 1659 11/23/15 0109 11/24/15 0320 11/25/15 0420 11/26/15 0420  NA 137 137 137 139 133*  K 4.9 4.6 4.6 4.1 4.0  CL 102 101 100* 97* 91*  CO2 21* 24 26 30 30   GLUCOSE 208* 205* 145* 103* 93  BUN 29* 27* 29* 20 18  CREATININE 1.63* 1.65* 1.77* 1.68* 1.92*  CALCIUM 9.6 9.3 8.8* 8.6* 8.4*   Liver Function Tests:  Recent Labs Lab 11/22/15 1659 11/23/15 0109 11/24/15 0320 11/25/15 0420  AST 178* 133* 125* 80*  ALT 273* 257* 273* 208*  ALKPHOS 252* 231* 194* 151*  BILITOT 1.4* 0.9 1.3* 1.2  PROT 6.5 5.9* 5.5* 4.9*  ALBUMIN 3.5 3.3* 2.9* 2.7*   No results for input(s): LIPASE, AMYLASE in the last 168 hours. No results for input(s): AMMONIA in the last 168 hours. CBC:  Recent Labs Lab 11/22/15 1659 11/23/15 0109 11/24/15 0320 11/25/15 0420 11/26/15 0420  WBC 7.8 8.1 7.2 5.4 5.2  NEUTROABS  --  5.3  --   --   --   HGB 12.0 11.0* 11.3* 10.3* 10.9*  HCT 36.8 34.1* 35.5* 32.0* 33.8*  MCV 94.8 94.5 95.4 94.7 95.2  PLT 209 217 223 193 180   Cardiac Enzymes:  Recent Labs Lab 11/23/15 0109  TROPONINI <0.03   BNP (last 3 results)  Recent Labs  11/22/15 2052  BNP 1732.2*    ProBNP (last 3 results) No results for input(s): PROBNP in the last 8760 hours.  CBG:  Recent Labs Lab 11/25/15 0612 11/25/15 1151 11/25/15 1648 11/25/15 2046 11/26/15 0535  GLUCAP 91 61* 92 91 81    No results found for this or any previous visit (from the past 240 hour(s)).   Studies: Dg Chest Port 1v Same Day  11/25/2015  CLINICAL DATA:  Chest wheezing today. EXAM: PORTABLE CHEST 1 VIEW COMPARISON:  November 22, 2015 FINDINGS: The heart size and mediastinal contours are stable. The heart size is enlarged. The lungs are hyperinflated. There is no focal infiltrate, pulmonary edema, or pleural effusion. The visualized skeletal structures are stable. IMPRESSION: No active cardiopulmonary disease.  Cardiomegaly.  Emphysema. Electronically Signed   By:  November 24, 2015 M.D.   On: 11/25/2015 16:58    Scheduled Meds: . amiodarone  150 mg Intravenous Once  . aspirin  81 mg Oral Daily  . diclofenac sodium  4 g Topical QID  . folic acid  1 mg Oral Daily  . furosemide  80 mg Intravenous Q12H  . guaiFENesin  1,200 mg Oral BID  . hydroxychloroquine  200 mg Oral BID  . insulin aspart  0-5 Units Subcutaneous QHS  . insulin aspart  0-9 Units Subcutaneous TID WC  . ipratropium-albuterol  3 mL Nebulization BID  . latanoprost  1 drop Both Eyes QHS  . levothyroxine  75 mcg Oral QAC breakfast  . loratadine  10 mg Oral Daily  . sodium chloride flush  3 mL Intravenous Q12H  . sodium chloride flush  3 mL Intravenous Q12H   Continuous Infusions: . amiodarone     Followed by  . amiodarone    . heparin 700 Units/hr (11/25/15 1901)  . nitroGLYCERIN 20 mcg/min (11/25/15 1145)    Principal Problem:   Acute congestive heart failure (HCC) Active Problems:   Hypertension   DM (diabetes mellitus) (HCC)   AKI (acute kidney injury) (HCC)   Acute combined systolic and diastolic heart failure (HCC)   Mitral regurgitation   Tricuspid regurgitation   Pulmonary hypertension (HCC)   ARF (  acute renal failure) (HCC)   Elevated LFTs   Positive D dimer  Time spent 30 minutes-Greater than 50% of this time was spent in counseling, explanation of diagnosis, planning of further management, and coordination of care.  Windell Norfolk MD  [facility] . If 7PM-7AM, please contact night-coverage at [email or web address], password [facility] 11/26/2015, 11:07 AM  LOS: 4 days

## 2015-11-26 NOTE — Progress Notes (Signed)
Utilization review completed.  

## 2015-11-26 NOTE — Progress Notes (Addendum)
ANTICOAGULATION CONSULT NOTE - Follow Up Consult  Pharmacy Consult for Heparin>>Eliquis Indication: atrial fibrillation  Allergies  Allergen Reactions  . Penicillins     Patient Measurements: Height: 5\' 6"  (167.6 cm) Weight: 169 lb 15.6 oz (77.1 kg) IBW/kg (Calculated) : 59.3 Heparin Dosing Weight: 77 kg  Vital Signs: Temp: 98.3 F (36.8 C) (03/17 0521) Temp Source: Oral (03/17 0521) BP: 104/57 mmHg (03/17 0521) Pulse Rate: 105 (03/17 0844)  Labs:  Recent Labs  11/24/15 0320  11/24/15 2020 11/25/15 0420 11/26/15 0420  HGB 11.3*  --   --  10.3* 10.9*  HCT 35.5*  --   --  32.0* 33.8*  PLT 223  --   --  193 180  LABPROT  --   --   --  18.8*  --   INR  --   --   --  1.56*  --   HEPARINUNFRC  --   < > 0.67 0.63 0.67  CREATININE 1.77*  --   --  1.68* 1.92*  < > = values in this interval not displayed.  Estimated Creatinine Clearance: 23.3 mL/min (by C-G formula based on Cr of 1.92).  Assessment:  Anticoagulation: Heparin for AFib (CHADsVASc = 5), VQ scan negative for PE, Doppler pending. HL 0.67, INR 1.56.  CBC stable.   Cardiovascular: hx HTN / HLD with new acute CHF (EF 25-30%) - BP low, tachy (in Afib/flutter), pBNP 1732. Meds: Amio inf, ASA81, IV Lasix,  - Cath 3/16: nonobstructive CAD, MR with severely reduced CO. MD doesn't  think the patient can tolerate mitral valve surgery very well. Consider inotropic therapy.   Goal of Therapy:  Heparin level 0.3-0.7 units/ml Monitor platelets by anticoagulation protocol: Yes   Plan:  -Cont heparin at 700 units/hr - Daily HL / CBC  Addendum 1136: d/c IV heparin and change to Eliquis which is affordable for patient. Dose adjusted for renal function/age. Eliquis 2.5mg  BID    Pheng Prokop S. 4/16, PharmD, BCPS Clinical Staff Pharmacist Pager [phone]  [phone] [place] 11/26/2015,9:00 AM

## 2015-11-26 NOTE — Discharge Instructions (Addendum)
Follow with Primary MD Gwenyth Bender, MD in 7 days   Get CBC, CMP, 2 view Chest X ray checked  by Primary MD next visit.    Activity: As tolerated with Full fall precautions use walker/cane & assistance as needed   Disposition Home     Diet:   Heart Healthy Low Carb, with feeding assistance and aspiration precautions.  Accuchecks 4 times/day, Once in AM empty stomach and then before each meal. Log in all results and show them to your Prim.MD in 3 days. If any glucose reading is under 80 or above 300 call your Prim MD immidiately. Follow Low glucose instructions for glucose under 80 as instructed.   For Heart failure patients - Check your Weight same time everyday, if you gain over 2 pounds, or you develop in leg swelling, experience more shortness of breath or chest pain, call your Primary MD immediately. Follow Cardiac Low Salt Diet and 1.5 lit/day fluid restriction.   On your next visit with your primary care physician please Get Medicines reviewed and adjusted.   Please request your Prim.MD to go over all Hospital Tests and Procedure/Radiological results at the follow up, please get all Hospital records sent to your Prim MD by signing hospital release before you go home.   If you experience worsening of your admission symptoms, develop shortness of breath, life threatening emergency, suicidal or homicidal thoughts you must seek medical attention immediately by calling 911 or calling your MD immediately  if symptoms less severe.  You Must read complete instructions/literature along with all the possible adverse reactions/side effects for all the Medicines you take and that have been prescribed to you. Take any new Medicines after you have completely understood and accpet all the possible adverse reactions/side effects.   Do not drive, operating heavy machinery, perform activities at heights, swimming or participation in water activities or provide baby sitting services if your were  admitted for syncope or siezures until you have seen by Primary MD or a Neurologist and advised to do so again.  Do not drive when taking Pain medications.    Do not take more than prescribed Pain, Sleep and Anxiety Medications  Special Instructions: If you have smoked or chewed Tobacco  in the last 2 yrs please stop smoking, stop any regular Alcohol  and or any Recreational drug use.  Wear Seat belts while driving.   Please note  You were cared for by a hospitalist during your hospital stay. If you have any questions about your discharge medications or the care you received while you were in the hospital after you are discharged, you can call the unit and asked to speak with the hospitalist on call if the hospitalist that took care of you is not available. Once you are discharged, your primary care physician will handle any further medical issues. Please note that NO REFILLS for any discharge medications will be authorized once you are discharged, as it is imperative that you return to your primary care physician (or establish a relationship with a primary care physician if you do not have one) for your aftercare needs so that they can reassess your need for medications and monitor your lab values.

## 2015-11-26 NOTE — Progress Notes (Addendum)
Patient Name: Theresa Cortez Date of Encounter: 11/26/2015    Primary Cardiologist: Dr. Royann Shivers Patient Profile: Theresa Cortez is a 82 y/o female with no prior cardiac history, admitted for new onset CHF, following a 3 week history of progressive dyspnea, cough, LEE and mid scapular pain. She has a PMH of HTN, DM, RA, and hypothyroidism. She presented to [facility] on 11/22/15 with cough, back pain, orthopnea, and bilateral leg edema. Her D- dimer was elevated at 2.19 (VQ scan normal), and BNP was 1732. Cardiology was consulted for evaluation of CHF. Left and right heart cath on 11/25/15 showed moderate pulmonary HTN, PAWP 28.  Echo shows EF of 25-30%.    SUBJECTIVE: Resting comfortably, is happy that her cough is better.  Breathing better. Denies chest pain or palpitations.   OBJECTIVE Filed Vitals:   11/25/15 1943 11/25/15 2049 11/26/15 0521 11/26/15 0528  BP:  114/78 104/57   Pulse: 104 113 106   Temp:  100.2 F (37.9 C) 98.3 F (36.8 C)   TempSrc:  Oral Oral   Resp: 22 20 20    Height:      Weight:   175 lb 1.6 oz (79.425 kg) 169 lb 15.6 oz (77.1 kg)  SpO2: 92% 92% 97%     Intake/Output Summary (Last 24 hours) at 11/26/15 0753 Last data filed at 11/26/15 0523  Gross per 24 hour  Intake 512.93 ml  Output   5751 ml  Net -5238.07 ml   Filed Weights   11/25/15 0613 11/26/15 0521 11/26/15 0528  Weight: 188 lb 11.2 oz (85.594 kg) 175 lb 1.6 oz (79.425 kg) 169 lb 15.6 oz (77.1 kg)    PHYSICAL EXAM General: Well developed, well nourished, female in no acute distress. Head: Normocephalic, atraumatic.  Neck: Supple without bruits, 6-7 cm JVD. Lungs:  Resp regular and unlabored, Rhonci in bilateral upper lobes, diminished crackles in BLL.  Heart: RRR, S1, S2, no S3, S4, No murmur; no rub. Abdomen: Soft, non-tender, non-distended, BS + x 4.  Extremities: No clubbing, cyanosis, No edema.  Neuro: Alert and oriented X 3. Moves all extremities spontaneously. Psych: Normal  affect.  LABS: CBC: Recent Labs  11/25/15 0420 11/26/15 0420  WBC 5.4 5.2  HGB 10.3* 10.9*  HCT 32.0* 33.8*  MCV 94.7 95.2  PLT 193 180   INR: Recent Labs  11/25/15 0420  INR 1.56*   Basic Metabolic Panel: Recent Labs  11/25/15 0420 11/26/15 0420  NA 139 133*  K 4.1 4.0  CL 97* 91*  CO2 30 30  GLUCOSE 103* 93  BUN 20 18  CREATININE 1.68* 1.92*  CALCIUM 8.6* 8.4*   Liver Function Tests: Recent Labs  11/24/15 0320 11/25/15 0420  AST 125* 80*  ALT 273* 208*  ALKPHOS 194* 151*  BILITOT 1.3* 1.2  PROT 5.5* 4.9*  ALBUMIN 2.9* 2.7*   BNP:  B NATRIURETIC PEPTIDE  Date/Time Value Ref Range Status  11/22/2015 08:52 PM 1732.2* 0.0 - 100.0 pg/mL Final   TELE: Afib        ECG:   Radiology/Studies: Dg Chest Port 1v Same Day  11/25/2015  CLINICAL DATA:  Chest wheezing today. EXAM: PORTABLE CHEST 1 VIEW COMPARISON:  November 22, 2015 FINDINGS: The heart size and mediastinal contours are stable. The heart size is enlarged. The lungs are hyperinflated. There is no focal infiltrate, pulmonary edema, or pleural effusion. The visualized skeletal structures are stable. IMPRESSION: No active cardiopulmonary disease.  Cardiomegaly.  Emphysema. Electronically Signed   By:  Sherian Rein M.D.   On: 11/25/2015 16:58     Current Medications:  . aspirin  81 mg Oral Daily  . diclofenac sodium  4 g Topical QID  . folic acid  1 mg Oral Daily  . furosemide  80 mg Intravenous Q12H  . guaiFENesin  1,200 mg Oral BID  . hydroxychloroquine  200 mg Oral BID  . insulin aspart  0-5 Units Subcutaneous QHS  . insulin aspart  0-9 Units Subcutaneous TID WC  . ipratropium-albuterol  3 mL Nebulization BID  . latanoprost  1 drop Both Eyes QHS  . levothyroxine  75 mcg Oral QAC breakfast  . loratadine  10 mg Oral Daily  . sodium chloride flush  3 mL Intravenous Q12H  . sodium chloride flush  3 mL Intravenous Q12H   . heparin 700 Units/hr (11/25/15 1901)  . nitroGLYCERIN 20 mcg/min (11/25/15  1145)    ASSESSMENT AND PLAN: Principal Problem:   Acute congestive heart failure (HCC) Active Problems:   Hypertension   DM (diabetes mellitus) (HCC)   AKI (acute kidney injury) (HCC)   Acute combined systolic and diastolic heart failure (HCC)   Mitral regurgitation   Tricuspid regurgitation   Pulmonary hypertension (HCC)   ARF (acute renal failure) (HCC)   Elevated LFTs   Positive D dimer  1. Acute Systolic CHF - Weight down today. - Will get IV diuresis today, PAWP was 28 yesterday. - Negative 1.9 L overnight.  - Cr. Higher today post cath.    2. Acute kidney injury - see above  3. PAF  - Looks like she went into Aflutter around 10pm last night, she then goes into Afib with frequent PVC's. In Aflutter this am.  - Will start Amio gtt      Signed, Little Ishikawa , NP 7:53 AM 11/26/2015  Pager [phone]  I have seen and examined the patient along with Little Ishikawa , NP.  I have reviewed the chart, notes and new data.  I agree with NP's note.  Key new complaints: breathing better, coughing less Key examination changes: back in normal rhythm with frequent ectopic beats Key new findings / data: NSR with PACs on monitor  PLAN:  Hopefully increase in creatinine is diuresis related, not contrast induced. Avoid RAAS inh until this is clear. Start Bidil-like combo. Will need gradual carvedilol titration. Since back in normal rhythm will try to avoid IV amiodarone: start PO amiodarone. Suspect MR is secondary to the cardiomyopathy and not vice versa. Reass Ef and MR after 3 months of maximized medical Rx for HF.  Thurmon Fair, MD, [facility] [phone] 11/26/2015, 11:39 AM

## 2015-11-26 NOTE — Progress Notes (Signed)
Insurance check completed for Eliquis Per rep at [employer]:   Price quoted at [employer] in [place]  $8.25 for 30 day retail/ $8.25 for 90 day retail   auth required [phone] or fax form found on [employer] to [phone]   Have left Pre-auth form on shadow chart -if completed CM will assist in faxing- thank you

## 2015-11-26 NOTE — Care Management Note (Signed)
Case Management Note Donn Pierini RN, BSN Unit 2W-Case Manager [phone]  Patient Details  Name: Theresa Cortez MRN: [id number] Date of Birth: 13-Jul-1930  Subjective/Objective:  Pt admitted with Acute HF                    Action/Plan: PTA pt lived at home with daughter, CM to follow for potential d/c needs  Expected Discharge Date:                  Expected Discharge Plan:  Home w Home Health Services  In-House Referral:     Discharge planning Services  CM Consult, Medication Assistance  Post Acute Care Choice:    Choice offered to:     DME Arranged:    DME Agency:     HH Arranged:    HH Agency:     Status of Service:  In process, will continue to follow  Medicare Important Message Given:  Yes Date Medicare IM Given:    Medicare IM give by:    Date Additional Medicare IM Given:    Additional Medicare Important Message give by:     If discussed at Long Length of Stay Meetings, dates discussed:    Additional Comments:  11/26/15- 1400- Donn Pierini RN, BSN- pt has been started on [employer] completed- spoke with pt and family at bedside to share info- Per rep at [employer]:   Price quoted at [employer] in [place]  $8.25 for 30 day retail/ $8.25 for 90 day retail   auth required [id number] or fax form found on [employer] to [phone]   Pt given 30 day free card along with paperwork from [employer] for insurance auth- - per conversation with pt/family pt has needed DME at home that includes cane, RW, lift chair, adjustable bed, BSC, elevated commode- no further DME needs noted at this time- pt states she does not currently have any HH following. CM to continue to follow for d/c needs  Darrold Span, RN 11/26/2015, 2:12 PM

## 2015-11-26 NOTE — Progress Notes (Signed)
Pt with frequent PVCs and elevated HR 105-115.  Pt asymptomatic, resting comfortably.  MD Onalee Hua notified, no new orders at this time, RN will continue to monitor pt closely.  Erenest Rasher, RN

## 2015-11-27 DIAGNOSIS — I5021 Acute systolic (congestive) heart failure: Secondary | ICD-10-CM

## 2015-11-27 LAB — BASIC METABOLIC PANEL
Anion gap: 13 (ref 5–15)
BUN: 22 mg/dL — AB (ref 6–20)
CHLORIDE: 90 mmol/L — AB (ref 101–111)
CO2: 30 mmol/L (ref 22–32)
Calcium: 8.3 mg/dL — ABNORMAL LOW (ref 8.9–10.3)
Creatinine, Ser: 2.16 mg/dL — ABNORMAL HIGH (ref 0.44–1.00)
GFR calc Af Amer: 23 mL/min — ABNORMAL LOW (ref >60)
GFR calc non Af Amer: 20 mL/min — ABNORMAL LOW (ref >60)
GLUCOSE: 100 mg/dL — AB (ref 65–99)
POTASSIUM: 3.9 mmol/L (ref 3.5–5.1)
Sodium: 133 mmol/L — ABNORMAL LOW (ref 135–145)

## 2015-11-27 LAB — GLUCOSE, CAPILLARY
GLUCOSE-CAPILLARY: 88 mg/dL (ref 65–99)
GLUCOSE-CAPILLARY: 113 mg/dL — AB (ref 65–99)

## 2015-11-27 MED ORDER — FUROSEMIDE 10 MG/ML IJ SOLN
40.0000 mg | Freq: Two times a day (BID) | INTRAMUSCULAR | Status: DC
  Administered 2015-11-27 – 2015-11-28 (×2): 40 mg via INTRAVENOUS
  Filled 2015-11-27: qty 4

## 2015-11-27 MED ORDER — AMIODARONE HCL 200 MG PO TABS
400.0000 mg | ORAL_TABLET | Freq: Two times a day (BID) | ORAL | Status: DC
  Administered 2015-11-27 – 2015-11-29 (×5): 400 mg via ORAL
  Filled 2015-11-27 (×5): qty 2

## 2015-11-27 NOTE — Progress Notes (Signed)
[facility] PROGRESS NOTE  Theresa Cortez:[id number] DOB: [birth year] DOA: 11/22/2015 PCP: Gwenyth Bender, MD  Brief narrative: 82 year old female with history of diabetes, hypertension who presented with cough, shortness of breath. Further evaluation revealed EF around 25-30% and severe MR. Underwent left heart catheterization that showed nonobstructive CAD. Hospital course complicated by development of A. fib RVR. Currently improving with IV diuretics and rate control. See below for details.  Subjective: Leg edema has resolved. A. fib RVR reoccurred last night.  Assessment/Plan: Principal Problem: Acute decompensated systolic heart failure: Much improved. Echo 3/15 showed EF 25-30% with moderate-severe mitral regurgitation. -11.6L so far, weight decreased to 169 lbs (weight on admit 186 lbs). Cardiology following-currently on IV Lasix, hydralazine and Imdur  Active Problems: Afib RVR: Initially required IV Cardizem for rate control, started on IV Heparin-CHA2DS2-VASc Score is 5. Rate now much better, Cardizem infusion was discontinued,and was started on amiodarone-however RVR recurred last night-and now back on cardizem infusion. Given concern for prolonged qtc-amiodarone discontinued as well. Will await Cards follow up today, will repeat EKG this am. Since no further invasive procedures are planned, IV heparin discontinued and started on Eliquis.  Severe MR: Per cardiology MR likely secondary to cardiomyopathy, plans are to try maximal medications first and repeat echocardiogram.   Acute on CKD stage 3: Likely has stage III CKD from either hypertensive nephrosclerosis or from cardiorenal syndrome. Mild ARF likely secondary to diuretics, and severe CHF. Did get some contrast with LHC  which could account for elevated creatinine as well. Supportive care, continue to monitor monitor renal function.  Elevated liver enzymes: likely congestive hepatitis given to the echo findings . RUQ  ultrasound, hepatitis panel negative. LFTs trending downward.  Cough: Persistent and non-productive. Distressful for patient. Likely secondary to postviral syndrome. Supportive care-Mucinex, incentive spirometry, nebulized bronchodilators. Do not see any indication for antibiotics-she already had 3 rounds of antibiotics as an outpatient.  Elevated d-dimer:  LE doppler negative for DVT, VQ scan negative for PE. Since low probability for PE, no further investigation required, furthermore will be on long-term anticoagulation for atrial fibrillation.  Hyponatremia:mild-likely 2/2 CHF. Follow  Hypothyroidism: TSH: WNL. Continue levothyroxine.  History of rheumatoid arthritis: Continue to hold methotrexate. Continue with Plaquenil for now - LFTs looking better.  Hyperlipidemia: Lipitor held d/t elevated LFTs. Resume statin when able.   Type 2 diabetes: Continue to hold oral hypoglycemics. Had one episode of hypoglycemia 11/23/2015. CBGs otherwise stable with SSI.Likely will need adjustment of oral hypoglycemics on discharge-given renal function  Hypertension: BP controlled-now on imdur and hydralazine-follow.   Code Status: Full Family Communication: Daughter at bedside Disposition Plan: Home when ready. DVT prophylaxis:Eliquis   Consultants: Cardiology  Procedures: Left and right heart cath 11/25/2015  Antibiotics: None  Objective: Filed Vitals:   11/27/15 0146 11/27/15 0609  BP: 100/50 83/55  Pulse: 96 75  Temp:  98.4 F (36.9 C)  Resp:  17    Intake/Output Summary (Last 24 hours) at 11/27/15 1130 Last data filed at 11/26/15 2138  Gross per 24 hour  Intake      0 ml  Output   1100 ml  Net  -1100 ml   Filed Weights   11/26/15 0521 11/26/15 0528 11/27/15 0458  Weight: 79.425 kg (175 lb 1.6 oz) 77.1 kg (169 lb 15.6 oz) 76.6 kg (168 lb 14 oz)    Exam:   General: WDWN, appears uncomfortable and weak.  Cardiovascular: S1, S2, S3. Minimal pitting edema on R leg, left leg:  1+ pitting edema  Respiratory: Bilateral wheezes R>L - much better than yesterday.   Abdomen: Soft, NT, ND. +BS.  Musculoskeletal: Moves all limbs against gravity  Data Reviewed: Basic Metabolic Panel:  Recent Labs Lab 11/23/15 0109 11/24/15 0320 11/25/15 0420 11/26/15 0420 11/27/15 0456  NA 137 137 139 133* 133*  K 4.6 4.6 4.1 4.0 3.9  CL 101 100* 97* 91* 90*  CO2 24 26 30 30 30   GLUCOSE 205* 145* 103* 93 100*  BUN 27* 29* 20 18 22*  CREATININE 1.65* 1.77* 1.68* 1.92* 2.16*  CALCIUM 9.3 8.8* 8.6* 8.4* 8.3*   Liver Function Tests:  Recent Labs Lab 11/22/15 1659 11/23/15 0109 11/24/15 0320 11/25/15 0420  AST 178* 133* 125* 80*  ALT 273* 257* 273* 208*  ALKPHOS 252* 231* 194* 151*  BILITOT 1.4* 0.9 1.3* 1.2  PROT 6.5 5.9* 5.5* 4.9*  ALBUMIN 3.5 3.3* 2.9* 2.7*   No results for input(s): LIPASE, AMYLASE in the last 168 hours. No results for input(s): AMMONIA in the last 168 hours. CBC:  Recent Labs Lab 11/22/15 1659 11/23/15 0109 11/24/15 0320 11/25/15 0420 11/26/15 0420  WBC 7.8 8.1 7.2 5.4 5.2  NEUTROABS  --  5.3  --   --   --   HGB 12.0 11.0* 11.3* 10.3* 10.9*  HCT 36.8 34.1* 35.5* 32.0* 33.8*  MCV 94.8 94.5 95.4 94.7 95.2  PLT 209 217 223 193 180   Cardiac Enzymes:  Recent Labs Lab 11/23/15 0109  TROPONINI <0.03   BNP (last 3 results)  Recent Labs  11/22/15 2052  BNP 1732.2*    ProBNP (last 3 results) No results for input(s): PROBNP in the last 8760 hours.  CBG:  Recent Labs Lab 11/26/15 0535 11/26/15 1126 11/26/15 1639 11/26/15 2132 11/27/15 0728  GLUCAP 81 79 142* 93 88    No results found for this or any previous visit (from the past 240 hour(s)).   Studies: Dg Chest Port 1v Same Day  11/25/2015  CLINICAL DATA:  Chest wheezing today. EXAM: PORTABLE CHEST 1 VIEW COMPARISON:  November 22, 2015 FINDINGS: The heart size and mediastinal contours are stable. The heart size is enlarged. The lungs are hyperinflated. There is no  focal infiltrate, pulmonary edema, or pleural effusion. The visualized skeletal structures are stable. IMPRESSION: No active cardiopulmonary disease.  Cardiomegaly.  Emphysema. Electronically Signed   By: November 24, 2015 M.D.   On: 11/25/2015 16:58    Scheduled Meds: . apixaban  2.5 mg Oral BID  . diclofenac sodium  4 g Topical QID  . diltiazem  10 mg Intravenous Once  . folic acid  1 mg Oral Daily  . furosemide  80 mg Intravenous Q12H  . guaiFENesin  1,200 mg Oral BID  . hydrALAZINE  10 mg Oral QID  . hydroxychloroquine  200 mg Oral BID  . insulin aspart  0-5 Units Subcutaneous QHS  . insulin aspart  0-9 Units Subcutaneous TID WC  . ipratropium-albuterol  3 mL Nebulization BID  . isosorbide dinitrate  5 mg Oral TID  . latanoprost  1 drop Both Eyes QHS  . levothyroxine  75 mcg Oral QAC breakfast  . loratadine  10 mg Oral Daily  . sodium chloride flush  3 mL Intravenous Q12H  . sodium chloride flush  3 mL Intravenous Q12H   Continuous Infusions: . diltiazem (CARDIZEM) infusion Stopped (11/27/15 11/29/15)    Principal Problem:   Acute congestive heart failure (HCC) Active Problems:   Hypertension   DM (diabetes mellitus) (  HCC)   AKI (acute kidney injury) (HCC)   Acute combined systolic and diastolic heart failure (HCC)   Mitral regurgitation   Tricuspid regurgitation   Pulmonary hypertension (HCC)   ARF (acute renal failure) (HCC)   Elevated LFTs   Positive D dimer  Time spent 30 minutes-Greater than 50% of this time was spent in counseling, explanation of diagnosis, planning of further management, and coordination of care.  Windell Norfolk MD  [facility] . If 7PM-7AM, please contact night-coverage at [email or web address], password [facility] 11/27/2015, 11:30 AM  LOS: 5 days

## 2015-11-27 NOTE — Progress Notes (Signed)
Pt converted to Afib with elevated HR 120-135. Pt asymptomatic, resting comfortably. MD made aware, started with Cardizem drip, held Lasix and Hydralazine. RN will continue to monitor pt closely.  Jinny Sanders RN

## 2015-11-27 NOTE — Progress Notes (Signed)
   11/26/15 2336  Vitals  BP (!) 100/54 mmHg  BP Location Left Arm  BP Method Automatic  Patient Position (if appropriate) Lying  Pulse Rate (!) 108  Pulse Rate Source Dinamap  Oxygen Therapy  SpO2 (!) 87 %  O2 Device Room Air    Pt. Started with 2L [place] sitting at 95%. No evidenced of SOB. Pt. Resting comfortably, respiratory clear and diminished to auscultation bilaterally. Will continue to monitor closely.  Jinny Sanders RN

## 2015-11-27 NOTE — Progress Notes (Signed)
    Subjective:  Denies CP; dyspnea improving   Objective:  Filed Vitals:   11/27/15 0609 11/27/15 0718 11/27/15 0740 11/27/15 1100  BP: 83/55   107/60  Pulse: 75   90  Temp: 98.4 F (36.9 C)     TempSrc: Oral     Resp: 17     Height:      Weight:      SpO2: 98% 99% 98% 100%    Intake/Output from previous day:  Intake/Output Summary (Last 24 hours) at 11/27/15 1203 Last data filed at 11/26/15 2138  Gross per 24 hour  Intake      0 ml  Output   1100 ml  Net  -1100 ml    Physical Exam: Physical exam: Well-developed well-nourished in no acute distress.  Skin is warm and dry.  HEENT is normal.  Neck is supple.  Chest with diffuse expiratory wheeze Cardiovascular exam is regular rate and rhythm.  Abdominal exam nontender or distended. No masses palpated. Extremities show no edema. neuro grossly intact    Lab Results: Basic Metabolic Panel:  Recent Labs  89/38/10 0420 11/27/15 0456  NA 133* 133*  K 4.0 3.9  CL 91* 90*  CO2 30 30  GLUCOSE 93 100*  BUN 18 22*  CREATININE 1.92* 2.16*  CALCIUM 8.4* 8.3*   CBC:  Recent Labs  11/25/15 0420 11/26/15 0420  WBC 5.4 5.2  HGB 10.3* 10.9*  HCT 32.0* 33.8*  MCV 94.7 95.2  PLT 193 180     Assessment/Plan:  1 atrial fibrillation-patient had recurrent atrial fibrillation this morning. She is now back in sinus rhythm. Her blood pressure is borderline and will not tolerate any notable blocking agents. DC cardizem. Resume amiodarone 400 mg twice a day. Continue apixaban.  2 nonischemic cardiomyopathy-blood pressure is low. Hold hydralazine and nitrates for now. ADD low-dose beta blocker and ACE inhibitor later as blood pressure and renal function and allow. 3. Acute systolic congestive heart failure-volume status and appears to be improving creatinine increasing. Change Lasix to 40 mg IV twice a day and follow. 4 acute on chronic kidney disease-follow renal function closely. 5 severe mitral regurgitation-likely  secondary to dilated cardiomyopathy.    Olga Millers 11/27/2015, 12:03 PM

## 2015-11-27 NOTE — Evaluation (Signed)
Physical Therapy Evaluation Patient Details Name: RENAYE Cortez MRN: [id number] DOB: 01-13-1930 Today's Date: 11/27/2015   History of Present Illness  [name] is a 82 y/o female with no prior cardiac history, admitted for new onset CHF, following a 3 week history of progressive dyspnea, cough, LEE and mid scapular pain. She has a PMH of HTN, DM, RA, and hypothyroidism. She presented to [facility] on 11/22/15 with cough, back pain, orthopnea, and bilateral leg edema.  Clinical Impression  Pt admitted with above diagnosis. Pt currently with functional limitations due to the deficits listed below (see PT Problem List). Pt very lethargic and fatigued today, could not get to EOB or transfer without min/ mod A.  Pt will benefit from skilled PT to increase their independence and safety with mobility to allow discharge to the venue listed below.       Follow Up Recommendations Home health PT;Supervision/Assistance - 24 hour    Equipment Recommendations  None recommended by PT    Recommendations for Other Services OT consult     Precautions / Restrictions Precautions Precautions: Fall Restrictions Weight Bearing Restrictions: No      Mobility  Bed Mobility Overal bed mobility: Needs Assistance;+2 for physical assistance Bed Mobility: Supine to Sit     Supine to sit: +2 for physical assistance;Mod assist     General bed mobility comments: pt had difficulty initiating motion and was very fatigued overall. vc's for sequencing, mod A to LE's and trunk to achieve sitting with use of rail and HOB elevated 30 degrees.   Transfers Overall transfer level: Needs assistance Equipment used: Rolling walker (2 wheeled) Transfers: Sit to/from [employer] Sit to Stand: Min assist;+2 physical assistance Stand pivot transfers: Min assist;+2 safety/equipment       General transfer comment: min A +2 for power up and stability, pt able to pivot with only min A and RW with +2 for  safety.   Ambulation/Gait             General Gait Details: not tested as pt's BP low and pt very fatigued  Stairs            Wheelchair Mobility    Modified Rankin (Stroke Patients Only)       Balance Overall balance assessment: Needs assistance Sitting-balance support: Single extremity supported Sitting balance-Leahy Scale: Fair Sitting balance - Comments: pt unsteady EOB until bilateral feet on floor Postural control: Posterior lean Standing balance support: Bilateral upper extremity supported Standing balance-Leahy Scale: Poor                               Pertinent Vitals/Pain Pain Assessment: No/denies pain  See general comments below    Home Living Family/patient expects to be discharged to:: Private residence Living Arrangements: Children Available Help at Discharge: Family;Available 24 hours/day Type of Home: House Home Access: Stairs to enter Entrance Stairs-Rails: Right Entrance Stairs-Number of Steps: 3 Home Layout: Two level;Able to live on main level with bedroom/bathroom Home Equipment: Dan Humphreys - 4 wheels Additional Comments: pt lives 2 hours away and has daughter there that is retired and with her most of the time. At home she has 6 STE, 1 level home, lift chair, and hospital bed. However, she has a dtr in [place] that she will go home with at d/c, home info above is her home. Her dtr from 2 hrs away will come and stay with her 24/7.  Prior Function Level of Independence: Needs assistance   Gait / Transfers Assistance Needed: ambulates with rollator independently  ADL's / Homemaking Assistance Needed: dtr has been helping her since summer 2016        Hand Dominance        Extremity/Trunk Assessment   Upper Extremity Assessment: Generalized weakness           Lower Extremity Assessment: Generalized weakness;RLE deficits/detail RLE Deficits / Details: has had multiple knee replacements on right side and knee with  decreased ROM and strength now    Cervical / Trunk Assessment: Normal  Communication   Communication: No difficulties  Cognition Arousal/Alertness: Lethargic Behavior During Therapy: Flat affect Overall Cognitive Status: Within Functional Limits for tasks assessed                      General Comments General comments (skin integrity, edema, etc.): upon entering, pt's O2 was off, O2 sats 88%, O2 replaced. BP in supine 90/44, sitting 81/41.     Exercises        Assessment/Plan    PT Assessment Patient needs continued PT services  PT Diagnosis Difficulty walking;Generalized weakness   PT Problem List Decreased strength;Decreased range of motion;Decreased activity tolerance;Decreased balance;Decreased mobility  PT Treatment Interventions DME instruction;Gait training;Stair training;Functional mobility training;Therapeutic activities;Therapeutic exercise;Balance training;Patient/family education   PT Goals (Current goals can be found in the Care Plan section) Acute Rehab PT Goals Patient Stated Goal: return home with daughter PT Goal Formulation: With patient/family Time For Goal Achievement: 12/11/15 Potential to Achieve Goals: Good    Frequency Min 3X/week   Barriers to discharge        Co-evaluation               End of Session Equipment Utilized During Treatment: Gait belt;Oxygen Activity Tolerance: Patient limited by fatigue;Patient limited by lethargy Patient left: in chair;with call bell/phone within reach;with family/visitor present Nurse Communication: Mobility status         Time: 8250-5397 PT Time Calculation (min) (ACUTE ONLY): 27 min   Charges:   PT Evaluation $PT Eval Moderate Complexity: 1 Procedure PT Treatments $Therapeutic Activity: 8-22 mins   PT G Codes:      Lyanne Co, PT  Acute Rehab Services  [phone]   Lyanne Co 11/27/2015, 9:52 AM

## 2015-11-28 ENCOUNTER — Inpatient Hospital Stay (HOSPITAL_COMMUNITY): Payer: Medicare Other

## 2015-11-28 LAB — BASIC METABOLIC PANEL
Anion gap: 11 (ref 5–15)
BUN: 18 mg/dL (ref 6–20)
CO2: 31 mmol/L (ref 22–32)
CREATININE: 1.7 mg/dL — AB (ref 0.44–1.00)
Calcium: 8 mg/dL — ABNORMAL LOW (ref 8.9–10.3)
Chloride: 90 mmol/L — ABNORMAL LOW (ref 101–111)
GFR calc Af Amer: 31 mL/min — ABNORMAL LOW (ref >60)
GFR, EST NON AFRICAN AMERICAN: 27 mL/min — AB (ref >60)
Glucose, Bld: 119 mg/dL — ABNORMAL HIGH (ref 65–99)
POTASSIUM: 4 mmol/L (ref 3.5–5.1)
SODIUM: 132 mmol/L — AB (ref 135–145)

## 2015-11-28 LAB — GLUCOSE, CAPILLARY
GLUCOSE-CAPILLARY: 101 mg/dL — AB (ref 65–99)
Glucose-Capillary: 98 mg/dL (ref 65–99)
Glucose-Capillary: 140 mg/dL — ABNORMAL HIGH (ref 65–99)
Glucose-Capillary: 104 mg/dL — ABNORMAL HIGH (ref 65–99)

## 2015-11-28 MED ORDER — SODIUM CHLORIDE 0.9 % IV BOLUS (SEPSIS)
250.0000 mL | Freq: Once | INTRAVENOUS | Status: AC
  Administered 2015-11-28: 250 mL via INTRAVENOUS

## 2015-11-28 MED ORDER — FUROSEMIDE 10 MG/ML IJ SOLN
40.0000 mg | Freq: Every day | INTRAMUSCULAR | Status: DC
  Filled 2015-11-28: qty 4

## 2015-11-28 NOTE — Progress Notes (Addendum)
[facility] PROGRESS NOTE  Theresa Cortez IFO:[id number] DOB: Sep 04, 1930 DOA: 11/22/2015 PCP: Gwenyth Bender, MD   Brief narrative:  82 year old female with history of diabetes, hypertension who presented with cough, shortness of breath. Further evaluation revealed EF around 25-30% and severe MR. Underwent left heart catheterization that showed nonobstructive CAD. Hospital course complicated by development of A. fib RVR. Currently improving with IV diuretics and rate control. See below for details.  Subjective:  Patient in bed, denies any fever or chills, no headache chest or abdominal pain, improving leg edema. No shortness of breath.  Assessment/Plan:  Acute on chronic decompensated systolic heart failure with severe pulmonary hypertension: Much improved. Echo 3/15 showed EF 25-30% with moderate-severe mitral regurgitation. - ve 14L so far, weight was186 lbs at the time of admission. Cardiology following-currently on IV Lasix, BP is soft therefore cardiology has stopped hydralazine and Imdur, no ACE inhibitor or ARB due to soft blood pressure and renal failure, cardiology has cut down IV Lasix dose to 40 mg daily on 11/28/2015. Patient close to being compensated.   Underwent right and left heart catheterization showing nonocclusive CAD and pulmonary hypertension. Defer management to cardiology.  Addendum at 4.35 pm on 11-28-15 - syncopal episode with BP 85/50, BP better in bed, IVF 250cc, Hold Lasix, TEDs, monitor.    Newly diagnosed paroxysmal Afib RVR: [place] vasc 2 score of 5. Initially required IV Cardizem for rate control, long with IV heparin, seen by cardiology now on amiodarone along with liquids. Rate improved. Blood pressure borderline. Cardiology following.  Severe MR: Per cardiology MR likely secondary to cardiomyopathy, plans are to try maximal medications first and repeat echocardiogram in the outpatient setting.   Acute on CKD stage 3: Likely has stage III CKD from either  hypertensive nephrosclerosis or from cardiorenal syndrome. Mild ARF likely secondary to diuretics, and severe CHF. Did get some contrast with LHC  which could account for elevated creatinine as well. Supportive care, continue to monitor monitor renal function.  Elevated liver enzymes: likely congestive hepatitis given to the echo findings . RUQ ultrasound, hepatitis panel negative. LFTs trending downward.  Cough: Persistent and non-productive. Distressful for patient. Likely secondary to postviral syndrome. Supportive care-Mucinex, incentive spirometry, nebulized bronchodilators. Do not see any indication for antibiotics-she already had 3 rounds of antibiotics as an outpatient.  Elevated d-dimer:  LE doppler negative for DVT, VQ scan negative for PE. Since low probability for PE, no further investigation required, furthermore will be on long-term anticoagulation for atrial fibrillation.  Hyponatremia:mild-likely 2/2 CHF. Follow  Hypothyroidism: TSH: WNL. Continue levothyroxine.  History of rheumatoid arthritis: Continue to hold methotrexate. Continue with Plaquenil for now - LFTs looking better.  Hyperlipidemia: Lipitor held d/t elevated LFTs. Resume statin when able.   Type 2 diabetes: Continue to hold oral hypoglycemics. Had one episode of hypoglycemia 11/23/2015. CBGs otherwise stable with SSI.Likely will need adjustment of oral hypoglycemics on discharge-given renal function  Hypertension: BP soft currently only on Lasix.   Code Status: Full Family Communication: Daughter at bedside Disposition Plan: Home when ready. DVT prophylaxis:Eliquis   Consultants: Cardiology  Procedures:   Left and right heart cath 11/25/2015   Ost 1st Diag to 1st Diag lesion, 40% stenosed.  Ost 1st Mrg lesion, 30% stenosed.  Mid Cx lesion, 40% stenosed.  1. Mild nonobstructive coronary artery disease. 2. Left ventricular angiography was not performed due to chronic kidney disease. 3. Right heart  catheterization showed moderate pulmonary hypertension with moderately elevated filling pressures, giant V waves  on pulmonary capillary wedge pressure tracing suggestive of mitral regurgitation and severely reduced cardiac output.  Recommendations: Continue diuresis and monitor renal function closely. With current hemodynamics, I don't think the patient can tolerate mitral valve surgery very well. Consider inotropic therapy.   Echo  Left ventricle: The cavity size was mildly dilated. Wall thickness was normal. Systolic function was severely reduced. The estimated ejection fraction was in the range of 25% to 30%. Akinesis of the inferior myocardium. Hypokinesis of the anteroseptal and apical myocardium. - Aortic valve: There was mild regurgitation. - Mitral valve: Calcified annulus. There was moderate to severe regurgitation directed posteriorly. - Left atrium: The atrium was mildly dilated. - Right ventricle: Systolic function was mildly reduced. - Right atrium: The atrium was mildly dilated. - Tricuspid valve: There was severe regurgitation directed centrally. There was moderate-severe perivalvular regurgitation. - Pulmonary arteries: Systolic pressure was moderately increased.  PA peak pressure: 53 mm Hg (S).   RUQ [place] -  Normal right upper quadrant ultrasound   Lower extremity venous ultrasound - - No evidence of deep vein thrombosis involving the right lower extremity and left lower extremity. - No evidence of Baker&'s cyst on the right or left. - Pulsatile venous flow suggestive of increased right side heart pressure.   Antibiotics: None  Objective: Filed Vitals:   11/27/15 2241 11/28/15 0651  BP: 107/63 93/55  Pulse: 104 93  Temp:  99 F (37.2 C)  Resp:  18    Intake/Output Summary (Last 24 hours) at 11/28/15 1116 Last data filed at 11/28/15 0651  Gross per 24 hour  Intake      3 ml  Output   1690 ml  Net  -1687 ml   Filed Weights   11/26/15 0528 11/27/15 0458  11/28/15 0651  Weight: 77.1 kg (169 lb 15.6 oz) 76.6 kg (168 lb 14 oz) 76.3 kg (168 lb 3.4 oz)    Exam:   General: WDWN, appears uncomfortable and weak.  Cardiovascular: S1, S2, S3. Minimal pitting edema on R leg, left leg: 1+ pitting edema  Respiratory: Bilateral wheezes R>L - much better than yesterday.   Abdomen: Soft, NT, ND. +BS.  Musculoskeletal: Moves all limbs against gravity  Data Reviewed: Basic Metabolic Panel:  Recent Labs Lab 11/24/15 0320 11/25/15 0420 11/26/15 0420 11/27/15 0456 11/28/15 0219  NA 137 139 133* 133* 132*  K 4.6 4.1 4.0 3.9 4.0  CL 100* 97* 91* 90* 90*  CO2 26 30 30 30 31   GLUCOSE 145* 103* 93 100* 119*  BUN 29* 20 18 22* 18  CREATININE 1.77* 1.68* 1.92* 2.16* 1.70*  CALCIUM 8.8* 8.6* 8.4* 8.3* 8.0*   Liver Function Tests:  Recent Labs Lab 11/22/15 1659 11/23/15 0109 11/24/15 0320 11/25/15 0420  AST 178* 133* 125* 80*  ALT 273* 257* 273* 208*  ALKPHOS 252* 231* 194* 151*  BILITOT 1.4* 0.9 1.3* 1.2  PROT 6.5 5.9* 5.5* 4.9*  ALBUMIN 3.5 3.3* 2.9* 2.7*   No results for input(s): LIPASE, AMYLASE in the last 168 hours. No results for input(s): AMMONIA in the last 168 hours. CBC:  Recent Labs Lab 11/22/15 1659 11/23/15 0109 11/24/15 0320 11/25/15 0420 11/26/15 0420  WBC 7.8 8.1 7.2 5.4 5.2  NEUTROABS  --  5.3  --   --   --   HGB 12.0 11.0* 11.3* 10.3* 10.9*  HCT 36.8 34.1* 35.5* 32.0* 33.8*  MCV 94.8 94.5 95.4 94.7 95.2  PLT 209 217 223 193 180   Cardiac Enzymes:  Recent  Labs Lab 11/23/15 0109  TROPONINI <0.03   BNP (last 3 results)  Recent Labs  11/22/15 2052  BNP 1732.2*    ProBNP (last 3 results) No results for input(s): PROBNP in the last 8760 hours.  CBG:  Recent Labs Lab 11/26/15 1639 11/26/15 2132 11/27/15 0728 11/27/15 2112 11/28/15 0621  GLUCAP 142* 93 88 113* 98    No results found for this or any previous visit (from the past 240 hour(s)).   Studies: Dg Thoracic Spine  W/swimmers  11/28/2015  CLINICAL DATA:  Cough and upper back pain. EXAM: THORACIC SPINE - 3 VIEWS COMPARISON:  Portable chest dated 11/25/2015. Two-view chest dated 11/22/2015. FINDINGS: Multilevel degenerative changes in the thoracic and cervical spine. No fractures or subluxations. IMPRESSION: No acute abnormality.  Multilevel degenerative changes. Electronically Signed   By: Beckie Salts M.D.   On: 11/28/2015 11:06    Scheduled Meds: . amiodarone  400 mg Oral BID  . apixaban  2.5 mg Oral BID  . diclofenac sodium  4 g Topical QID  . folic acid  1 mg Oral Daily  . [START ON 11/29/2015] furosemide  40 mg Intravenous Daily  . guaiFENesin  1,200 mg Oral BID  . hydroxychloroquine  200 mg Oral BID  . insulin aspart  0-5 Units Subcutaneous QHS  . insulin aspart  0-9 Units Subcutaneous TID WC  . ipratropium-albuterol  3 mL Nebulization BID  . latanoprost  1 drop Both Eyes QHS  . levothyroxine  75 mcg Oral QAC breakfast  . loratadine  10 mg Oral Daily   Continuous Infusions:    Principal Problem:   Acute congestive heart failure (HCC) Active Problems:   Hypertension   DM (diabetes mellitus) (HCC)   AKI (acute kidney injury) (HCC)   Acute combined systolic and diastolic heart failure (HCC)   Mitral regurgitation   Tricuspid regurgitation   Pulmonary hypertension (HCC)   ARF (acute renal failure) (HCC)   Elevated LFTs   Positive D dimer  Time spent 30 minutes-Greater than 50% of this time was spent in counseling, explanation of diagnosis, planning of further management, and coordination of care.  Signature  Leroy Sea M.D on 11/28/2015 at 11:16 AM  Between 7am to 7pm - Pager - [phone], After 7pm go to [email or web address] - password [facility]  [facility]  - Office  [phone]

## 2015-11-28 NOTE — Progress Notes (Signed)
    Subjective:  Denies CP; dyspnea improving   Objective:  Filed Vitals:   11/27/15 1947 11/27/15 2241 11/28/15 0651 11/28/15 1011  BP: 88/54 107/63 93/55   Pulse: 97 104 93   Temp: 98.7 F (37.1 C)  99 F (37.2 C)   TempSrc: Oral  Oral   Resp: 18  18   Height:      Weight:   168 lb 3.4 oz (76.3 kg)   SpO2: 99% 100% 100% 97%    Intake/Output from previous day:  Intake/Output Summary (Last 24 hours) at 11/28/15 1015 Last data filed at 11/28/15 0651  Gross per 24 hour  Intake      3 ml  Output   1690 ml  Net  -1687 ml    Physical Exam: Physical exam: Well-developed well-nourished in no acute distress.  Skin is warm and dry.  HEENT is normal.  Neck is supple.  Chest with diffuse expiratory wheeze Cardiovascular exam is regular rate and rhythm.  Abdominal exam nontender or distended. No masses palpated. Extremities show no edema. neuro grossly intact    Lab Results: Basic Metabolic Panel:  Recent Labs  32/20/25 0456 11/28/15 0219  NA 133* 132*  K 3.9 4.0  CL 90* 90*  CO2 30 31  GLUCOSE 100* 119*  BUN 22* 18  CREATININE 2.16* 1.70*  CALCIUM 8.3* 8.0*   CBC:  Recent Labs  11/26/15 0420  WBC 5.2  HGB 10.9*  HCT 33.8*  MCV 95.2  PLT 180     Assessment/Plan:  Theresa Cortez is a 82 y/o female with no prior cardiac history, admitted for new onset CHF, following a 3 week history of progressive dyspnea, cough, LEE and mid scapular pain. She has a PMH of HTN, DM, RA, and hypothyroidism. She presented to [facility] on 11/22/15 with cough, back pain, orthopnea, and bilateral leg edema. Her D- dimer was elevated at 2.19 (VQ scan normal), and BNP was 1732. Cardiology was consulted for evaluation of CHF. Left and right heart cath on 11/25/15 showed moderate pulmonary HTN, PAWP 28; no obstructive CAD. Echo shows EF of 25-30%.   1 atrial fibrillation-patient remains in sinus rhythm. Her blood pressure is borderline and will not tolerate any notable blocking  agents. Continue amiodarone 400 mg twice a day for 2 weeks and then 200 mg daily thereafter. Continue apixaban.  2 nonischemic cardiomyopathy-blood pressure is low. Add low-dose beta blocker and ACE inhibitor later as blood pressure and renal function allow. 3. Acute systolic congestive heart failure-volume status and appears to be improving. I/O -[id number]. Change Lasix to 40 mg IV dialy and follow. 4 acute on chronic kidney disease-follow renal function closely.  5 severe mitral regurgitation-likely secondary to dilated cardiomyopathy.    Olga Millers 11/28/2015, 10:15 AM

## 2015-11-29 DIAGNOSIS — I4891 Unspecified atrial fibrillation: Secondary | ICD-10-CM

## 2015-11-29 LAB — GLUCOSE, CAPILLARY
GLUCOSE-CAPILLARY: 79 mg/dL (ref 65–99)
GLUCOSE-CAPILLARY: 143 mg/dL — AB (ref 65–99)
Glucose-Capillary: 143 mg/dL — ABNORMAL HIGH (ref 65–99)
Glucose-Capillary: 163 mg/dL — ABNORMAL HIGH (ref 65–99)

## 2015-11-29 LAB — BASIC METABOLIC PANEL
Anion gap: 12 (ref 5–15)
BUN: 22 mg/dL — ABNORMAL HIGH (ref 6–20)
CHLORIDE: 89 mmol/L — AB (ref 101–111)
CO2: 32 mmol/L (ref 22–32)
CREATININE: 1.7 mg/dL — AB (ref 0.44–1.00)
Calcium: 8.3 mg/dL — ABNORMAL LOW (ref 8.9–10.3)
GFR calc non Af Amer: 27 mL/min — ABNORMAL LOW (ref >60)
GFR, EST AFRICAN AMERICAN: 31 mL/min — AB (ref >60)
Glucose, Bld: 87 mg/dL (ref 65–99)
POTASSIUM: 4 mmol/L (ref 3.5–5.1)
Sodium: 133 mmol/L — ABNORMAL LOW (ref 135–145)

## 2015-11-29 LAB — MAGNESIUM: MAGNESIUM: 2.1 mg/dL (ref 1.7–2.4)

## 2015-11-29 MED ORDER — AMIODARONE HCL 200 MG PO TABS
200.0000 mg | ORAL_TABLET | Freq: Two times a day (BID) | ORAL | Status: DC
  Administered 2015-11-29 – 2015-11-30 (×2): 200 mg via ORAL
  Filled 2015-11-29 (×2): qty 1

## 2015-11-29 MED ORDER — DOCUSATE SODIUM 100 MG PO CAPS
200.0000 mg | ORAL_CAPSULE | Freq: Two times a day (BID) | ORAL | Status: DC
Start: 2015-11-29 — End: 2015-11-30
  Administered 2015-11-29 – 2015-11-30 (×3): 200 mg via ORAL
  Filled 2015-11-29 (×3): qty 2

## 2015-11-29 MED ORDER — BISACODYL 10 MG RE SUPP
10.0000 mg | Freq: Every day | RECTAL | Status: DC
  Administered 2015-11-29: 10 mg via RECTAL
  Filled 2015-11-29: qty 1

## 2015-11-29 MED ORDER — POLYETHYLENE GLYCOL 3350 17 G PO PACK
17.0000 g | PACK | Freq: Two times a day (BID) | ORAL | Status: DC
  Administered 2015-11-30: 17 g via ORAL
  Filled 2015-11-29 (×2): qty 1

## 2015-11-29 MED ORDER — SODIUM CHLORIDE 0.9 % IV BOLUS (SEPSIS)
250.0000 mL | Freq: Once | INTRAVENOUS | Status: AC
  Administered 2015-11-29: 250 mL via INTRAVENOUS

## 2015-11-29 NOTE — NC FL2 (Signed)
[place] MEDICAID FL2 LEVEL OF CARE SCREENING TOOL     IDENTIFICATION  Patient Name: Theresa Cortez Birthdate: 1930/04/12 Sex: female Admission Date (Current Location): 11/22/2015  [facility] and [place] Number:  Producer, television/film/video and Address:  [facility] [place], [address], [place], [place] [address]      Provider Number: [id number]  Attending Physician Name and Address:  Leroy Sea, MD  Relative Name and Phone Number:       Current Level of Care: Hospital Recommended Level of Care: Skilled Nursing Facility Prior Approval Number:    Date Approved/Denied:   PASRR Number: [id number]  Discharge Plan: SNF    Current Diagnoses: Patient Active Problem List   Diagnosis Date Noted  . ARF (acute renal failure) (HCC)   . Elevated LFTs   . Positive D dimer   . Acute combined systolic and diastolic heart failure (HCC)   . Mitral regurgitation   . Tricuspid regurgitation   . Pulmonary hypertension (HCC)   . AKI (acute kidney injury) (HCC) 11/23/2015  . Heart failure (HCC) 11/22/2015  . Acute congestive heart failure (HCC) 11/22/2015  . Hypertension 10/11/2012  . Cellulitis of knee, right 10/11/2012  . DM (diabetes mellitus) (HCC) 10/11/2012  . Dermatitis 10/11/2012  . Leg pain, right 10/11/2012    Orientation RESPIRATION BLADDER Height & Weight     Self, Time, Situation, Place  O2 (2L [place]) Indwelling catheter Weight: 168 lb 3.4 oz (76.3 kg) Height:  5\' 6"  (167.6 cm)  BEHAVIORAL SYMPTOMS/MOOD NEUROLOGICAL BOWEL NUTRITION STATUS      Continent Diet  AMBULATORY STATUS COMMUNICATION OF NEEDS Skin   Extensive Assist Verbally Normal                       Personal Care Assistance Level of Assistance  Bathing, Dressing Bathing Assistance: Maximum assistance   Dressing Assistance: Maximum assistance     Functional Limitations Info  Sight Sight Info: Impaired        SPECIAL CARE FACTORS FREQUENCY  PT (By licensed PT), OT (By  licensed OT)     PT Frequency: 5/wk OT Frequency: 5/wk            Contractures      Additional Factors Info  Code Status, Insulin Sliding Scale Code Status Info: FULL     Insulin Sliding Scale Info: 4/day Isolation Precautions Info: none     Current Medications (11/29/2015):  This is the current hospital active medication list Current Facility-Administered Medications  Medication Dose Route Frequency Provider Last Rate Last Dose  . acetaminophen (TYLENOL) tablet 650 mg  650 mg Oral Q4H PRN 12/01/2015, MD   650 mg at 11/28/15 1817  . albuterol (PROVENTIL) (2.5 MG/3ML) 0.083% nebulizer solution 2.5 mg  2.5 mg Nebulization Q2H PRN 11/30/15, MD   2.5 mg at 11/29/15 0620  . amiodarone (PACERONE) tablet 400 mg  400 mg Oral BID 0621, MD   400 mg at 11/28/15 2203  . apixaban (ELIQUIS) tablet 2.5 mg  2.5 mg Oral BID 2204, RPH   2.5 mg at 11/28/15 2203  . benzonatate (TESSALON) capsule 200 mg  200 mg Oral Q8H PRN 2204, MD   200 mg at 11/27/15 1204  . diclofenac sodium (VOLTAREN) 1 % transdermal gel 4 g  4 g Topical QID 11/29/15, MD   4 g at 11/25/15 2200  . folic acid (FOLVITE) tablet 1  mg  1 mg Oral Daily Clydie Braun, MD   1 mg at 11/28/15 1108  . guaiFENesin (MUCINEX) 12 hr tablet 1,200 mg  1,200 mg Oral BID Maretta Bees, MD   1,200 mg at 11/28/15 2203  . HYDROcodone-acetaminophen (NORCO/VICODIN) 5-325 MG per tablet 1 tablet  1 tablet Oral Q6H PRN Mihai Croitoru, MD      . hydroxychloroquine (PLAQUENIL) tablet 200 mg  200 mg Oral BID Clydie Braun, MD   200 mg at 11/28/15 2203  . insulin aspart (novoLOG) injection 0-5 Units  0-5 Units Subcutaneous QHS Clydie Braun, MD   0 Units at 11/23/15 0104  . insulin aspart (novoLOG) injection 0-9 Units  0-9 Units Subcutaneous TID WC Clydie Braun, MD   1 Units at 11/28/15 1818  . ipratropium-albuterol (DUONEB) 0.5-2.5 (3) MG/3ML nebulizer solution 3 mL  3 mL Nebulization BID  Maretta Bees, MD   3 mL at 11/29/15 0748  . latanoprost (XALATAN) 0.005 % ophthalmic solution 1 drop  1 drop Both Eyes QHS Clydie Braun, MD   1 drop at 11/28/15 2203  . levothyroxine (SYNTHROID, LEVOTHROID) tablet 75 mcg  75 mcg Oral QAC breakfast Clydie Braun, MD   75 mcg at 11/29/15 0610  . loratadine (CLARITIN) tablet 10 mg  10 mg Oral Daily Maretta Bees, MD   10 mg at 11/28/15 1108  . ondansetron (ZOFRAN) injection 4 mg  4 mg Intravenous Q6H PRN Clydie Braun, MD   4 mg at 11/27/15 1311  . technetium TC 90M diethylenetriame-pentaacetic acid (DTPA) injection 32 milli Curie  32 milli Curie Intravenous Once PRN Maretta Bees, MD         Discharge Medications: Please see discharge summary for a list of discharge medications.  Relevant Imaging Results:  Relevant Lab Results:   Additional Information SS#: [id number]  Theresa Cortez, [place]

## 2015-11-29 NOTE — Clinical Social Work Placement (Signed)
   CLINICAL SOCIAL WORK PLACEMENT  NOTE  Date:  11/29/2015  Patient Details  Name: Theresa Cortez MRN: [id number] Date of Birth: 08/22/1930  Clinical Social Work is seeking post-discharge placement for this patient at the Skilled  Nursing Facility level of care (*CSW will initial, date and re-position this form in  chart as items are completed):  Yes   Patient/family provided with [facility] Clinical Social Work Department's list of facilities offering this level of care within the geographic area requested by the patient (or if unable, by the patient's family).  Yes   Patient/family informed of their freedom to choose among providers that offer the needed level of care, that participate in Medicare, Medicaid or managed care program needed by the patient, have an available bed and are willing to accept the patient.  Yes   Patient/family informed of [facility]'s ownership interest in [facility] and [facility], as well as of the fact that they are under no obligation to receive care at these facilities.  PASRR submitted to EDS on       PASRR number received on       Existing PASRR number confirmed on 11/29/15     FL2 transmitted to all facilities in geographic area requested by pt/family on 11/29/15     FL2 transmitted to all facilities within larger geographic area on       Patient informed that his/her managed care company has contracts with or will negotiate with certain facilities, including the following:            Patient/family informed of bed offers received.  Patient chooses bed at       Physician recommends and patient chooses bed at      Patient to be transferred to   on  .  Patient to be transferred to facility by       Patient family notified on   of transfer.  Name of family member notified:        PHYSICIAN Please sign FL2     Additional Comment:    _______________________________________________ [name], LCSW 11/29/2015, 4:02  PM

## 2015-11-29 NOTE — Clinical Social Work Note (Signed)
Clinical Social Work Assessment  Patient Details  Name: Theresa Cortez MRN: [id number] Date of Birth: 1930/01/05  Date of referral:  11/29/15               Reason for consult:  Facility Placement                Permission sought to share information with:  Family Supports, Oceanographer granted to share information::  Yes, Verbal Permission Granted  Name::     Theresa Cortez  Agency::  [facility]  Relationship::  dtrs  Contact Information:     Housing/Transportation Living arrangements for the past 2 months:  Single Family Home Source of Information:  Patient, Adult Children Patient Interpreter Needed:  None Criminal Activity/Legal Involvement Pertinent to Current Situation/Hospitalization:  No - Comment as needed Significant Relationships:  Adult Children Lives with:  Adult Children Do you feel safe going back to the place where you live?  No Need for family participation in patient care:  Yes (Comment) (help with decision making)  Care giving concerns:  Pt lives at home with dtr but family does not think thye can care for pt after this admission given current level of impairment   Office manager / plan:  CSW spoke with pt and pt dtr concerning plan for time of DC- pt and family requested SNF placement for pt despite PT recommendation for home health.  Pt and dtr do not think pt can return safely home at this time. CSW explained SNF referral process.  Employment status:  Retired Health and safety inspector:  Medicare PT Recommendations:  Home with Home Health Information / Referral to community resources:  Skilled Nursing Facility  Patient/Family's Response to care:  Pt and family agreeable to SNF placement and prefer [facility] placement.  Patient/Family's Understanding of and Emotional Response to Diagnosis, Current Treatment, and Prognosis:  Pt very quiet during interview seems overwhelmed by process but dtrs express good  understanding of pt condition and needs.  Emotional Assessment Appearance:  Appears stated age Attitude/Demeanor/Rapport:  Sedated Affect (typically observed):  Appropriate Orientation:  Oriented to Situation, Oriented to  Time, Oriented to Place, Oriented to Self Alcohol / Substance use:  Not Applicable Psych involvement (Current and /or in the community):  No (Comment)  Discharge Needs  Concerns to be addressed:  Home Safety Concerns Readmission within the last 30 days:  No Current discharge risk:  Physical Impairment Barriers to Discharge:  Continued Medical Work up   [employer], LCSW 11/29/2015, 3:58 PM

## 2015-11-29 NOTE — Progress Notes (Signed)
Physical Therapy Treatment Patient Details Name: KALEEN ROCHETTE MRN: [id number] DOB: 20-Aug-1930 Today's Date: 11/29/2015    History of Present Illness Ms. Wisdom is a 82 y/o female with no prior cardiac history, admitted for new onset CHF, following a 3 week history of progressive dyspnea, cough, LEE and mid scapular pain. She has a PMH of HTN, DM, RA, and hypothyroidism. She presented to [facility] on 11/22/15 with cough, back pain, orthopnea, and bilateral leg edema.    PT Comments    Patient with poor tolerance to upright activity this session.  Just had suppository so motivated to get up to [facility], but upon sitting up to EOB felt poorly and lasted about a minute to obtain sitting BP then returned to supine with +nausea.  Daughter in room and assisting with mobility to scoot to [facility].  Feel family confident and eager to assist pt at home.  Will continue acute level PT as tolerated.  Follow Up Recommendations  Home health PT;Supervision/Assistance - 24 hour     Equipment Recommendations  None recommended by PT    Recommendations for Other Services       Precautions / Restrictions Precautions Precautions: Fall Precaution Comments: watch BP    Mobility  Bed Mobility Overal bed mobility: Needs Assistance Bed Mobility: Rolling;Sidelying to Sit;Sit to Supine Rolling: Mod assist Sidelying to sit: Max assist;HOB elevated   Sit to supine: Max assist;HOB elevated   General bed mobility comments: assist with rail to roll and to bring legs off bed and to lift trunk even after cue to use rail and increased time given,  pt c/o feeling poorly in sitting so returned to supine after sitting BP taken and pt with nausea  Transfers                 General transfer comment: NT due to weakness/ nausea  Ambulation/Gait                 Stairs            Wheelchair Mobility    Modified Rankin (Stroke Patients Only)       Balance   Sitting-balance support: Feet  supported;Bilateral upper extremity supported Sitting balance-Leahy Scale: Poor Sitting balance - Comments: min A given in sitting x about 1 minute while taking sitting BP due to weakness and feeling poorly requesting to return to supine                            Cognition Arousal/Alertness: Awake/alert Behavior During Therapy: WFL for tasks assessed/performed Overall Cognitive Status: Within Functional Limits for tasks assessed                      Exercises      General Comments General comments (skin integrity, edema, etc.): BP supine 84/52; sitting 98/61      Pertinent Vitals/Pain Pain Assessment: No/denies pain    Home Living                      Prior Function            PT Goals (current goals can now be found in the care plan section) Progress towards PT goals: Not progressing toward goals - comment (due to illness)    Frequency  Min 3X/week    PT Plan Current plan remains appropriate    Co-evaluation  End of Session Equipment Utilized During Treatment: Oxygen Activity Tolerance: Patient limited by fatigue;Other (comment) (limited by nausea) Patient left: in bed;with call bell/phone within reach;with family/visitor present     Time: 6203-5597 PT Time Calculation (min) (ACUTE ONLY): 12 min  Charges:  $Therapeutic Activity: 8-22 mins                    G Codes:      Elray Mcgregor 12/03/2015, 2:29 PM Sheran Lawless, PT [phone] December 03, 2015

## 2015-11-29 NOTE — Progress Notes (Signed)
Subjective:  C/o nausea this afternoon following soup. No dyspnea at rest. Currently meeting with [facility] for expected disposition.   Objective:  Vital Signs in the last 24 hours: Temp:  [98.4 F (36.9 C)-99.3 F (37.4 C)] 98.9 F (37.2 C) (03/20 0525) Pulse Rate:  [81-121] 81 (03/20 0525) Resp:  [17-18] 18 (03/20 0525) BP: (82-128)/(47-94) 91/52 mmHg (03/20 0525) SpO2:  [98 %-100 %] 100 % (03/20 0525)  Intake/Output from previous day: 03/19 0701 - 03/20 0700 In: -  Out: 450 [Urine:450]  Physical Exam: Pt is alert and oriented, elderly woman in NAD HEENT: normal Neck: JVP - normal Lungs: diffuse rhonchi bilaterally CV: RRR without murmur or gallop, distant heart sounds Abd: soft, NT, Positive BS, no hepatomegaly Ext: no C/C/E, distal pulses intact and equal Skin: warm/dry no rash   Lab Results: No results for input(s): WBC, HGB, PLT in the last 72 hours.  Recent Labs  11/28/15 0219 11/29/15 0243  NA 132* 133*  K 4.0 4.0  CL 90* 89*  CO2 31 32  GLUCOSE 119* 87  BUN 18 22*  CREATININE 1.70* 1.70*   No results for input(s): TROPONINI in the last 72 hours.  Invalid input(s): CK, MB  Cardiac Studies: 2D Echo 11/24/2015: Study Conclusions  - Left ventricle: The cavity size was mildly dilated. Wall  thickness was normal. Systolic function was severely reduced. The  estimated ejection fraction was in the range of 25% to 30%.  Akinesis of the inferior myocardium. Hypokinesis of the  anteroseptal and apical myocardium. - Aortic valve: There was mild regurgitation. - Mitral valve: Calcified annulus. There was moderate to severe  regurgitation directed posteriorly. - Left atrium: The atrium was mildly dilated. - Right ventricle: Systolic function was mildly reduced. - Right atrium: The atrium was mildly dilated. - Tricuspid valve: There was severe regurgitation directed  centrally. There was moderate-severe perivalvular regurgitation. - Pulmonary  arteries: Systolic pressure was moderately increased.  PA peak pressure: 53 mm Hg (S).  Right/left heart catheterization: Procedures    Right/Left Heart Cath and Coronary Angiography    Conclusion     Ost 1st Diag to 1st Diag lesion, 40% stenosed.  Ost 1st Mrg lesion, 30% stenosed.  Mid Cx lesion, 40% stenosed.  1. Mild nonobstructive coronary artery disease. 2. Left ventricular angiography was not performed due to chronic kidney disease. 3. Right heart catheterization showed moderate pulmonary hypertension with moderately elevated filling pressures, giant V waves on pulmonary capillary wedge pressure tracing suggestive of mitral regurgitation and severely reduced cardiac output.  Recommendations: Continue diuresis and monitor renal function closely. With current hemodynamics, I don't think the patient can tolerate mitral valve surgery very well. Consider inotropic therapy.      Tele: Sinus rhythm, AF last night but converted   Assessment/Plan:  1. Acute on chronic systolic HF: clinically improved with diuresis. Now with hypotension and near syncope yesterday. Agree with holding diuretics and gentle fluids. Unable to start ACE or beta-blocker at this point secondary to hypotension.   2. Severe mitral regurgitation: unable to appreciate on exam because of loud lung sounds but would recommend repeat echo as outpatient after she is recovered from hospitalization, likely 3 months.   3. Atrial fibrillation with RVR: maintaining NSR now on amiodarone. Will decrease to 200 mg BID with nausea and QT prolongation (pt also on plaquenil). QT prolongation partly related to LBBB and QTc not much different than baseline so ok to observe for now. Will repeat EKG in am.  4. AKI: stable. Hold diuretics today. Reassess tomorrow.  5. Atrial fibrillation with RVR: on Eliquis (renally dosed). On amiodarone for rhythm control.   Tonny Bollman, M.D. 11/29/2015, 11:52 AM

## 2015-11-29 NOTE — Progress Notes (Signed)
[facility] PROGRESS NOTE  Theresa Cortez YJE:[id number] DOB: 1930-01-09 DOA: 11/22/2015 PCP: Gwenyth Bender, MD   Brief narrative:  82 year old female with history of diabetes, hypertension who presented with cough, shortness of breath. Further evaluation revealed EF around 25-30% and severe MR. Underwent left heart catheterization that showed nonobstructive CAD. Hospital course complicated by development of A. fib RVR. Currently improving with IV diuretics and rate control. See below for details.  Subjective:  Patient in bed, denies any fever or chills, no headache chest or abdominal pain, improving leg edema. No shortness of breath.  Assessment/Plan:  Acute on chronic decompensated systolic heart failure with severe pulmonary hypertension: Much improved. Echo 3/15 showed EF 25-30% with moderate-severe mitral regurgitation. - ve 14.8L so far, weight was186 lbs at the time of admission and currently 168 pounds. Cardiology following-  BP is soft therefore cardiology has stopped hydralazine and Imdur, no ACE inhibitor or ARB due to soft blood pressure and renal failure, Patient close to being compensated. Underwent right and left heart catheterization showing nonocclusive CAD and pulmonary hypertension. Defer management to cardiology.  Note at 4.35 pm on 11-28-15 - syncopal episode with BP 85/50, BP better in bed, IVF 250cc, Hold Lasix, TEDs, monitor. Lasix was discontinued on 11/28/2015. Will wait for cardiology input on 11/29/2015.   Filed Weights   11/26/15 0528 11/27/15 0458 11/28/15 0651  Weight: 77.1 kg (169 lb 15.6 oz) 76.6 kg (168 lb 14 oz) 76.3 kg (168 lb 3.4 oz)     Newly diagnosed paroxysmal Afib RVR: [place] vasc 2 score of 5. Initially required IV Cardizem for rate control, long with IV heparin, seen by cardiology now on amiodarone along with liquids. Rate improved. Blood pressure borderline. Cardiology following.  Severe MR: Per cardiology MR likely secondary to  cardiomyopathy, plans are to try maximal medications first and repeat echocardiogram in the outpatient setting.   Acute on CKD stage 3: Likely has stage III CKD from either hypertensive nephrosclerosis or from cardiorenal syndrome. Mild ARF likely secondary to diuretics, and severe CHF. Did get some contrast with LHC  which could account for elevated creatinine as well. Supportive care, continue to monitor monitor renal function.  Elevated liver enzymes: likely congestive hepatitis given to the echo findings . RUQ ultrasound, hepatitis panel negative. LFTs trending downward.  Cough: Persistent and non-productive. Distressful for patient. Likely secondary to postviral syndrome. Supportive care-Mucinex, incentive spirometry, nebulized bronchodilators. Do not see any indication for antibiotics-she already had 3 rounds of antibiotics as an outpatient.  Elevated d-dimer:  LE doppler negative for DVT, VQ scan negative for PE. Since low probability for PE, no further investigation required, furthermore will be on long-term anticoagulation for atrial fibrillation.  Hyponatremia:mild-likely 2/2 CHF. Follow  Hypothyroidism: TSH: WNL. Continue levothyroxine.  History of rheumatoid arthritis: Continue to hold methotrexate. Continue with Plaquenil for now - LFTs looking better.  Hyperlipidemia: Lipitor held d/t elevated LFTs. Resume statin upon DC  Hypertension: BP soft currently only on Lasix.    Type 2 diabetes: Continue to hold oral hypoglycemics. Had one episode of hypoglycemia 11/23/2015. CBGs otherwise stable with SSI. Likely will need adjustment of oral hypoglycemics on discharge-given renal function  CBG (last 3)   Recent Labs  11/28/15 2047 11/29/15 0616 11/29/15 1120  GLUCAP 104* 79 143*    Code Status: Full Family Communication: Daughter at bedside Disposition Plan: SNF versus home health PT likely on 11/30/2015 DVT prophylaxis:Eliquis   Consultants: Cardiology  Procedures:    Left and right heart cath  11/25/2015   Ost 1st Diag to 1st Diag lesion, 40% stenosed.  Ost 1st Mrg lesion, 30% stenosed.  Mid Cx lesion, 40% stenosed.  1. Mild nonobstructive coronary artery disease. 2. Left ventricular angiography was not performed due to chronic kidney disease. 3. Right heart catheterization showed moderate pulmonary hypertension with moderately elevated filling pressures, giant V waves on pulmonary capillary wedge pressure tracing suggestive of mitral regurgitation and severely reduced cardiac output.  Recommendations: Continue diuresis and monitor renal function closely. With current hemodynamics, I don't think the patient can tolerate mitral valve surgery very well. Consider inotropic therapy.   Echo  Left ventricle: The cavity size was mildly dilated. Wall thickness was normal. Systolic function was severely reduced. The estimated ejection fraction was in the range of 25% to 30%. Akinesis of the inferior myocardium. Hypokinesis of the anteroseptal and apical myocardium. - Aortic valve: There was mild regurgitation. - Mitral valve: Calcified annulus. There was moderate to severe regurgitation directed posteriorly. - Left atrium: The atrium was mildly dilated. - Right ventricle: Systolic function was mildly reduced. - Right atrium: The atrium was mildly dilated. - Tricuspid valve: There was severe regurgitation directed centrally. There was moderate-severe perivalvular regurgitation. - Pulmonary arteries: Systolic pressure was moderately increased.  PA peak pressure: 53 mm Hg (S).   RUQ [place] -  Normal right upper quadrant ultrasound   Lower extremity venous ultrasound - - No evidence of deep vein thrombosis involving the right lower extremity and left lower extremity. - No evidence of Baker&'s cyst on the right or left. - Pulsatile venous flow suggestive of increased right side heart pressure.   Antibiotics: None  Objective: Filed Vitals:   11/28/15  2200 11/29/15 0525  BP: 88/51 91/52  Pulse: 83 81  Temp:  98.9 F (37.2 C)  Resp:  18    Intake/Output Summary (Last 24 hours) at 11/29/15 1155 Last data filed at 11/28/15 2051  Gross per 24 hour  Intake      0 ml  Output    450 ml  Net   -450 ml   Filed Weights   11/26/15 0528 11/27/15 0458 11/28/15 0651  Weight: 77.1 kg (169 lb 15.6 oz) 76.6 kg (168 lb 14 oz) 76.3 kg (168 lb 3.4 oz)    Exam:   General: WDWN, appears uncomfortable and weak.  Cardiovascular: S1, S2, S3. Minimal pitting edema on R leg, left leg: 1+ pitting edema  Respiratory: Bilateral wheezes R>L - much better than yesterday.   Abdomen: Soft, NT, ND. +BS.  Musculoskeletal: Moves all limbs against gravity  Data Reviewed: Basic Metabolic Panel:  Recent Labs Lab 11/25/15 0420 11/26/15 0420 11/27/15 0456 11/28/15 0219 11/29/15 0243  NA 139 133* 133* 132* 133*  K 4.1 4.0 3.9 4.0 4.0  CL 97* 91* 90* 90* 89*  CO2 30 30 30 31  32  GLUCOSE 103* 93 100* 119* 87  BUN 20 18 22* 18 22*  CREATININE 1.68* 1.92* 2.16* 1.70* 1.70*  CALCIUM 8.6* 8.4* 8.3* 8.0* 8.3*  MG  --   --   --   --  2.1   Liver Function Tests:  Recent Labs Lab 11/22/15 1659 11/23/15 0109 11/24/15 0320 11/25/15 0420  AST 178* 133* 125* 80*  ALT 273* 257* 273* 208*  ALKPHOS 252* 231* 194* 151*  BILITOT 1.4* 0.9 1.3* 1.2  PROT 6.5 5.9* 5.5* 4.9*  ALBUMIN 3.5 3.3* 2.9* 2.7*   No results for input(s): LIPASE, AMYLASE in the last 168 hours. No results for input(s):  AMMONIA in the last 168 hours. CBC:  Recent Labs Lab 11/22/15 1659 11/23/15 0109 11/24/15 0320 11/25/15 0420 11/26/15 0420  WBC 7.8 8.1 7.2 5.4 5.2  NEUTROABS  --  5.3  --   --   --   HGB 12.0 11.0* 11.3* 10.3* 10.9*  HCT 36.8 34.1* 35.5* 32.0* 33.8*  MCV 94.8 94.5 95.4 94.7 95.2  PLT 209 217 223 193 180   Cardiac Enzymes:  Recent Labs Lab 11/23/15 0109  TROPONINI <0.03   BNP (last 3 results)  Recent Labs  11/22/15 2052  BNP 1732.2*     ProBNP (last 3 results) No results for input(s): PROBNP in the last 8760 hours.  CBG:  Recent Labs Lab 11/28/15 1135 11/28/15 1750 11/28/15 2047 11/29/15 0616 11/29/15 1120  GLUCAP 101* 140* 104* 79 143*    No results found for this or any previous visit (from the past 240 hour(s)).   Studies: Dg Thoracic Spine W/swimmers  11/28/2015  CLINICAL DATA:  Cough and upper back pain. EXAM: THORACIC SPINE - 3 VIEWS COMPARISON:  Portable chest dated 11/25/2015. Two-view chest dated 11/22/2015. FINDINGS: Multilevel degenerative changes in the thoracic and cervical spine. No fractures or subluxations. IMPRESSION: No acute abnormality.  Multilevel degenerative changes. Electronically Signed   By: Beckie Salts M.D.   On: 11/28/2015 11:06    Scheduled Meds: . amiodarone  400 mg Oral BID  . apixaban  2.5 mg Oral BID  . diclofenac sodium  4 g Topical QID  . folic acid  1 mg Oral Daily  . guaiFENesin  1,200 mg Oral BID  . hydroxychloroquine  200 mg Oral BID  . insulin aspart  0-5 Units Subcutaneous QHS  . insulin aspart  0-9 Units Subcutaneous TID WC  . ipratropium-albuterol  3 mL Nebulization BID  . latanoprost  1 drop Both Eyes QHS  . levothyroxine  75 mcg Oral QAC breakfast  . loratadine  10 mg Oral Daily   Continuous Infusions:    Principal Problem:   Acute congestive heart failure (HCC) Active Problems:   Hypertension   DM (diabetes mellitus) (HCC)   AKI (acute kidney injury) (HCC)   Acute combined systolic and diastolic heart failure (HCC)   Mitral regurgitation   Tricuspid regurgitation   Pulmonary hypertension (HCC)   ARF (acute renal failure) (HCC)   Elevated LFTs   Positive D dimer  Time spent 30 minutes-Greater than 50% of this time was spent in counseling, explanation of diagnosis, planning of further management, and coordination of care.  Signature  Susa Raring K M.D on 11/29/2015 at 11:55 AM  Between 7am to 7pm - Pager - [phone], After 7pm go to  [email or web address] - password [facility]  [facility]  - Office  [phone]

## 2015-11-29 NOTE — Care Management Important Message (Signed)
Important Message  Patient Details  Name: CLEONA DOUBLEDAY MRN: [id number] Date of Birth: 1930-06-08   Medicare Important Message Given:  Yes    Danyetta Gillham P Jiyan Walkowski 11/29/2015, 3:16 PM

## 2015-11-30 LAB — GLUCOSE, CAPILLARY: GLUCOSE-CAPILLARY: 88 mg/dL (ref 65–99)

## 2015-11-30 MED ORDER — INSULIN ASPART 100 UNIT/ML ~~LOC~~ SOLN: SUBCUTANEOUS | Status: DC

## 2015-11-30 MED ORDER — DOCUSATE SODIUM 100 MG PO CAPS: 200.0000 mg | ORAL_CAPSULE | Freq: Two times a day (BID) | ORAL | Status: DC

## 2015-11-30 MED ORDER — AMIODARONE HCL 200 MG PO TABS: 200.0000 mg | ORAL_TABLET | Freq: Two times a day (BID) | ORAL | Status: DC

## 2015-11-30 MED ORDER — FUROSEMIDE 40 MG PO TABS: 40.0000 mg | ORAL_TABLET | Freq: Every day | ORAL | Status: DC

## 2015-11-30 MED ORDER — APIXABAN 2.5 MG PO TABS: 2.5000 mg | ORAL_TABLET | Freq: Two times a day (BID) | ORAL | Status: DC

## 2015-11-30 NOTE — Discharge Summary (Signed)
Theresa Cortez, is a 82 y.o. female  DOB Sep 01, 1930  MRN [id number].  Admission date:  11/22/2015  Admitting Physician  Clydie Braun, MD  Discharge Date:  11/30/2015   Primary MD  Gwenyth Bender, MD  Recommendations for primary care physician for things to follow:   Check CBC, CMP, weight closely, monitor diuretic dose and CBGs.   Admission Diagnosis  Cough [R05]   Discharge Diagnosis  Cough [R05]     Principal Problem:   Acute congestive heart failure (HCC) Active Problems:   Hypertension   DM (diabetes mellitus) (HCC)   AKI (acute kidney injury) (HCC)   Acute combined systolic and diastolic heart failure (HCC)   Mitral regurgitation   Tricuspid regurgitation   Pulmonary hypertension (HCC)   ARF (acute renal failure) (HCC)   Elevated LFTs   Positive D dimer   Atrial fibrillation with rapid ventricular response (HCC)      Past Medical History  Diagnosis Date  . Hypertension   . Wound infection (HCC)     Right knee   . Hypothyroidism   . History of diverticulosis   . Hyperlipidemia   . Type II diabetes mellitus (HCC)   . History of blood transfusion X 2    w/knee replacement  . Stroke [facility]) 2000    denies residual on 11/23/2015  . Rheumatoid arthritis(714.0)   . DJD (degenerative joint disease)   . Arthritis     "hands, arms" (11/23/2015)    Past Surgical History  Procedure Laterality Date  . Knee arthroscopy Right   . Total knee arthroplasty Right 08/07/2011; 2014  . Partial colectomy      descending colon by Dr. Orson Slick  . Appendectomy    . Medial partial knee replacement Right   . Incision and drainage mouth Right 2014    "knee; took replacement out; put  spacers in"  . Tubal ligation    . Cataract extraction w/ intraocular lens implant Right   . Cardiac catheterization N/A  11/25/2015    Procedure: Right/Left Heart Cath and Coronary Angiography;  Surgeon: Iran Ouch, MD;  Location: [facility] INVASIVE CV LAB;  Service: Cardiovascular;  Laterality: N/A;       HPI  from the history and physical done on the day of admission:   82 year old female with history of diabetes, hypertension who presented with cough, shortness of breath. Further evaluation revealed EF around 25-30% and severe MR. Underwent left heart catheterization that showed nonobstructive CAD. Hospital course complicated by development of A. fib RVR. Currently improving with IV diuretics and rate control. See below for details.      Hospital Course:    Acute on chronic decompensated systolic heart failure with severe pulmonary hypertension: Much improved. Echo 3/15 showed EF 25-30% with moderate-severe mitral regurgitation. - ve 15L so far, weight was186 lbs at the time of admission and currently 167 pounds. She was followed by cardiology throughout, her blood pressures have been soft hence cardiology has stopped hydralazine and Imdur, no ACE inhibitor or  ARB due to soft blood pressure and renal failure, Patient close to being compensated. Underwent right and left heart catheterization showing nonocclusive CAD and pulmonary hypertension. Defer management to cardiology. Note patient will be discharged on Lasix 40 mg daily to be started on 12/02/2015. Follow 1.5 L daily fluid restriction along with low-salt diet, follow with cardiology outpatient post discharge. Monitor weight, BMP and adjust diuretic dose as needed.   Newly diagnosed paroxysmal Afib RVR: [place] vasc 2 score of 5. Initially required IV Cardizem for rate control, long with IV heparin, seen by cardiology now on amiodarone along with liquids. Rate improved. Blood pressure borderline. Cardiology following.  Severe MR: Per cardiology MR likely secondary to cardiomyopathy, plans are to try maximal medications first and repeat echocardiogram in the  outpatient setting.  Acute on CKD stage 3: Likely has stage III CKD from either hypertensive nephrosclerosis or from cardiorenal syndrome. Baseline creatinine close to 1.3, hold diuretics for another 2 days, her renal function has stabilized, monitor weight and diuretic dose closely avoid nephrotoxins..  Elevated liver enzymes: likely congestive hepatitis given to the echo findings . RUQ ultrasound, hepatitis panel negative. LFTs trending downward.  Cough: Persistent and non-productive. Distressful for patient. Likely secondary to postviral syndrome. Supportive care-Mucinex, incentive spirometry, nebulized bronchodilators. Do not see any indication for antibiotics-she already had 3 rounds of antibiotics as an outpatient.  Elevated d-dimer: LE doppler negative for DVT, VQ scan negative for PE. Since low probability for PE, no further investigation required, furthermore will be on long-term anticoagulation for atrial fibrillation.  Hyponatremia:mild-likely 2/2 CHF. Follow  Hypothyroidism: TSH: WNL. Continue levothyroxine.  History of rheumatoid arthritis: Continue to hold methotrexate. Continue with Plaquenil for now - LFTs looking better.  Hyperlipidemia: Lipitor held d/t elevated LFTs. Resume statin upon DC  Hypertension: BP soft currently only on Lasix.   Type 2 diabetes: Discontinued oral hypoglycemic, sliding scale insulin every before meals at bedtime monitor CBGs closely.    Discharge Condition: Fair  Follow UP  Follow-up Information    Follow up with Gwenyth Bender, MD. Schedule an appointment as soon as possible for a visit in 1 week.   Specialty:  Internal Medicine   Contact information:   [address] [place] [address] [phone]       Follow up with Marca Ancona, MD. Schedule an appointment as soon as possible for a visit in 1 week.   Specialty:  Cardiology   Contact information:   [address] [place]  [address] [phone]        Consults obtained - Cards  Diet and Activity recommendation: See Discharge Instructions below  Discharge Instructions           Discharge Instructions    Discharge instructions    Complete by:  As directed   Follow with Primary MD Gwenyth Bender, MD in 7 days   Get CBC, CMP, 2 view Chest X ray checked  by Primary MD next visit.    Activity: As tolerated with Full fall precautions use walker/cane & assistance as needed   Disposition Home     Diet:   Heart Healthy Low Carb, with feeding assistance and aspiration precautions.  Accuchecks 4 times/day, Once in AM empty stomach and then before each meal. Log in all results and show them to your Prim.MD in 3 days. If any glucose reading is under 80 or above 300 call your Prim MD immidiately. Follow Low glucose instructions for glucose under 80  as instructed.   For Heart failure patients - Check your Weight same time everyday, if you gain over 2 pounds, or you develop in leg swelling, experience more shortness of breath or chest pain, call your Primary MD immediately. Follow Cardiac Low Salt Diet and 1.5 lit/day fluid restriction.   On your next visit with your primary care physician please Get Medicines reviewed and adjusted.   Please request your Prim.MD to go over all Hospital Tests and Procedure/Radiological results at the follow up, please get all Hospital records sent to your Prim MD by signing hospital release before you go home.   If you experience worsening of your admission symptoms, develop shortness of breath, life threatening emergency, suicidal or homicidal thoughts you must seek medical attention immediately by calling 911 or calling your MD immediately  if symptoms less severe.  You Must read complete instructions/literature along with all the possible adverse reactions/side effects for all the Medicines you take and that have been prescribed to you. Take any new Medicines after you have  completely understood and accpet all the possible adverse reactions/side effects.   Do not drive, operating heavy machinery, perform activities at heights, swimming or participation in water activities or provide baby sitting services if your were admitted for syncope or siezures until you have seen by Primary MD or a Neurologist and advised to do so again.  Do not drive when taking Pain medications.    Do not take more than prescribed Pain, Sleep and Anxiety Medications  Special Instructions: If you have smoked or chewed Tobacco  in the last 2 yrs please stop smoking, stop any regular Alcohol  and or any Recreational drug use.  Wear Seat belts while driving.   Please note  You were cared for by a hospitalist during your hospital stay. If you have any questions about your discharge medications or the care you received while you were in the hospital after you are discharged, you can call the unit and asked to speak with the hospitalist on call if the hospitalist that took care of you is not available. Once you are discharged, your primary care physician will handle any further medical issues. Please note that NO REFILLS for any discharge medications will be authorized once you are discharged, as it is imperative that you return to your primary care physician (or establish a relationship with a primary care physician if you do not have one) for your aftercare needs so that they can reassess your need for medications and monitor your lab values.     Increase activity slowly    Complete by:  As directed              Discharge Medications       Medication List    STOP taking these medications        aspirin 81 MG tablet     doxycycline 100 MG tablet  Commonly known as:  VIBRA-TABS     glimepiride 2 MG tablet  Commonly known as:  AMARYL     predniSONE 20 MG tablet  Commonly known as:  DELTASONE     valsartan 80 MG tablet  Commonly known as:  DIOVAN      TAKE these  medications        amiodarone 200 MG tablet  Commonly known as:  PACERONE  Take 1 tablet (200 mg total) by mouth 2 (two) times daily.     apixaban 2.5 MG Tabs tablet  Commonly known as:  ELIQUIS  Take 1 tablet (2.5 mg total) by mouth 2 (two) times daily.     atorvastatin 10 MG tablet  Commonly known as:  LIPITOR  Take 10 mg by mouth daily.     benzonatate 200 MG capsule  Commonly known as:  TESSALON  Take 200 mg by mouth every 8 (eight) hours as needed for cough.     diclofenac sodium 1 % Gel  Commonly known as:  VOLTAREN  Apply 4 g topically 4 (four) times daily.     docusate sodium 100 MG capsule  Commonly known as:  COLACE  Take 2 capsules (200 mg total) by mouth 2 (two) times daily.     folic acid 1 MG tablet  Commonly known as:  FOLVITE  Take 1 mg by mouth daily.     furosemide 40 MG tablet  Commonly known as:  LASIX  Take 1 tablet (40 mg total) by mouth daily.  Start taking on:  12/02/2015     hydroxychloroquine 200 MG tablet  Commonly known as:  PLAQUENIL  Take 200 mg by mouth 2 (two) times daily.     insulin aspart 100 UNIT/ML injection  Commonly known as:  NOVOLOG  Before each meal 3 times a day, 140-199 - 2 units, 200-250 - 4 units, 251-299 - 6 units,  300-349 - 8 units,  350 or above 10 units. Dispense syringes and needles as needed, Ok to switch to PEN if approved. Substitute to any brand approved. DX DM2, Code E11.65     latanoprost 0.005 % ophthalmic solution  Commonly known as:  XALATAN  Place 1 drop into both eyes at bedtime.     levothyroxine 50 MCG tablet  Commonly known as:  SYNTHROID, LEVOTHROID  Take 75 mcg by mouth daily.     methotrexate 2.5 MG tablet  Commonly known as:  RHEUMATREX  Take 25 mg by mouth once a week. Caution:Chemotherapy. Protect from light. On Friday     sitaGLIPtin 50 MG tablet  Commonly known as:  JANUVIA  Take 50 mg by mouth daily.        Major procedures and Radiology Reports - PLEASE review detailed and final  reports for all details, in brief -    Left and right heart cath 11/25/2015   Ost 1st Diag to 1st Diag lesion, 40% stenosed.  Ost 1st Mrg lesion, 30% stenosed.  Mid Cx lesion, 40% stenosed.  1. Mild nonobstructive coronary artery disease. 2. Left ventricular angiography was not performed due to chronic kidney disease. 3. Right heart catheterization showed moderate pulmonary hypertension with moderately elevated filling pressures, giant V waves on pulmonary capillary wedge pressure tracing suggestive of mitral regurgitation and severely reduced cardiac output.  Recommendations: Continue diuresis and monitor renal function closely. With current hemodynamics, I don't think the patient can tolerate mitral valve surgery very well. Consider inotropic therapy.   Echo  Left ventricle: The cavity size was mildly dilated. Wall thickness was normal. Systolic function was severely reduced. The estimated ejection fraction was in the range of 25% to 30%. Akinesis of the inferior myocardium. Hypokinesis of the anteroseptal and apical myocardium. - Aortic valve: There was mild regurgitation. - Mitral valve: Calcified annulus. There was moderate to severe regurgitation directed posteriorly. - Left atrium: The atrium was mildly dilated. - Right ventricle: Systolic function was mildly reduced. - Right atrium: The atrium was mildly dilated. - Tricuspid valve: There was severe regurgitation directed centrally. There was moderate-severe perivalvular regurgitation. - Pulmonary arteries: Systolic pressure was moderately increased.  PA peak pressure: 53 mm Hg (S).   RUQ [place] - Normal right upper quadrant ultrasound   Lower extremity venous ultrasound - - No evidence of deep vein thrombosis involving the right lower extremity and left lower extremity. - No evidence of Baker&'s cyst on the right or left. - Pulsatile venous flow suggestive of increased right side heart pressure.   Dg Chest 2  View  11/22/2015  CLINICAL DATA:  Cough for 2 day EXAM: CHEST  2 VIEW COMPARISON:  04/26/2015 FINDINGS: The heart is moderately enlarged. Lungs are hyperaerated. Pulmonary vascular is within normal limits. Tiny pleural effusions and bibasilar atelectasis. No pneumothorax. IMPRESSION: Cardiomegaly without decompensation. Tiny pleural effusions and bibasilar atelectasis. Electronically Signed   By: Jolaine Click M.D.   On: 11/22/2015 18:02   Dg Thoracic Spine W/swimmers  11/28/2015  CLINICAL DATA:  Cough and upper back pain. EXAM: THORACIC SPINE - 3 VIEWS COMPARISON:  Portable chest dated 11/25/2015. Two-view chest dated 11/22/2015. FINDINGS: Multilevel degenerative changes in the thoracic and cervical spine. No fractures or subluxations. IMPRESSION: No acute abnormality.  Multilevel degenerative changes. Electronically Signed   By: Beckie Salts M.D.   On: 11/28/2015 11:06   US Renal  11/23/2015  CLINICAL DATA:  Acute renal failure EXAM: RENAL / URINARY TRACT ULTRASOUND COMPLETE COMPARISON:  01/27/2011 FINDINGS: Right Kidney: Length: 10.9 cm. Normal echogenicity with no mass or hydronephrosis. 9 x 5 x 5 mm exophytic lower pole cyst. Left Kidney: Length: 9.7 cm. Echogenicity within normal limits. No mass or hydronephrosis visualized. Bladder: Appears normal for degree of bladder distention. IMPRESSION: No significant abnormalities Electronically Signed   By: Esperanza Heir M.D.   On: 11/23/2015 12:21   Nm Pulmonary Perf And Vent  11/23/2015  CLINICAL DATA:  82 year old female with progressive shortness of breath cough and mid scapular pain. Abnormal D-dimer. Initial encounter. EXAM: NUCLEAR MEDICINE VENTILATION - PERFUSION LUNG SCAN TECHNIQUE: Ventilation images were obtained in multiple projections using inhaled aerosol Tc-1m DTPA. Perfusion images were obtained in multiple projections after intravenous injection of Tc-69m MAA. RADIOPHARMACEUTICALS:  32.0 Technetium-95m DTPA aerosol inhalation and 4.3  Technetium-15m MAA IV COMPARISON:  PA and lateral chest radiographs 11/22/2015. FINDINGS: Ventilation: No ventilation defect identified. Photopenia related to cardiomegaly noted. Perfusion: Homogeneous perfusion radiotracer activity. No wedge shaped peripheral perfusion defects to suggest acute pulmonary embolism. IMPRESSION: Normal VQ scan. Electronically Signed   By: Odessa Fleming M.D.   On: 11/23/2015 16:07   Dg Chest Port 1v Same Day  11/25/2015  CLINICAL DATA:  Chest wheezing today. EXAM: PORTABLE CHEST 1 VIEW COMPARISON:  November 22, 2015 FINDINGS: The heart size and mediastinal contours are stable. The heart size is enlarged. The lungs are hyperinflated. There is no focal infiltrate, pulmonary edema, or pleural effusion. The visualized skeletal structures are stable. IMPRESSION: No active cardiopulmonary disease.  Cardiomegaly.  Emphysema. Electronically Signed   By: Sherian Rein M.D.   On: 11/25/2015 16:58   US Abdomen Limited Ruq  11/23/2015  CLINICAL DATA:  Elevated liver function tests EXAM: US ABDOMEN LIMITED - RIGHT UPPER QUADRANT COMPARISON:  12/28/2004 FINDINGS: Gallbladder: No gallstones or wall thickening visualized. No sonographic Murphy sign noted by sonographer. Common bile duct: Diameter: 3 mm Liver: No focal lesion identified. Within normal limits in parenchymal echogenicity. IMPRESSION: Normal right upper quadrant ultrasound Electronically Signed   By: Esperanza Heir M.D.   On: 11/23/2015 12:18    Micro Results      No results found for this or any previous visit (  from the past 240 hour(s)).     Today   Subjective    Valynn Behnen today has no headache,no chest abdominal pain,no new weakness tingling or numbness, feels much better .     Objective   Blood pressure 94/53, pulse 66, temperature 97.4 F (36.3 C), temperature source Oral, resp. rate 18, height 5\' 6"  (1.676 m), weight 76 kg (167 lb 8.8 oz), SpO2 100 %.   Intake/Output Summary (Last 24 hours) at 11/30/15  0949 Last data filed at 11/30/15 0530  [place] per 24 hour  Intake      0 ml  Output    525 ml  Net   -525 ml    Exam Awake Alert, Oriented x 3, No new F.N deficits, Normal affect [place].AT,PERRAL Supple Neck,No JVD, No cervical lymphadenopathy appriciated.  Symmetrical Chest wall movement, Good air movement bilaterally, CTAB RRR,No Gallops,Rubs or new Murmurs, No Parasternal Heave +ve B.Sounds, Abd Soft, Non tender, No organomegaly appriciated, No rebound -guarding or rigidity. No Cyanosis, Clubbing or edema, No new Rash or bruise   Data Review   CBC w Diff:  Lab Results  Component Value Date   WBC 5.2 11/26/2015   HGB 10.9* 11/26/2015   HCT 33.8* 11/26/2015   PLT 180 11/26/2015   LYMPHOPCT 20 11/23/2015   MONOPCT 14 11/23/2015   EOSPCT 0 11/23/2015   BASOPCT 0 11/23/2015    CMP:  Lab Results  Component Value Date   NA 133* 11/29/2015   K 4.0 11/29/2015   CL 89* 11/29/2015   CO2 32 11/29/2015   BUN 22* 11/29/2015   CREATININE 1.70* 11/29/2015   PROT 4.9* 11/25/2015   ALBUMIN 2.7* 11/25/2015   BILITOT 1.2 11/25/2015   ALKPHOS 151* 11/25/2015   AST 80* 11/25/2015   ALT 208* 11/25/2015  .  CBG (last 3)   Recent Labs  11/29/15 1624 11/29/15 2004 11/30/15 0617  GLUCAP 143* 163* 88     Total Time in preparing paper work, data evaluation and todays exam - 35 minutes  Leroy Sea M.D on 11/30/2015 at 9:49 AM  [facility]   Office  [phone]

## 2015-11-30 NOTE — Progress Notes (Signed)
Patient transported to [facility] by EMS. Daughter went with her. IV was dc'd and intact. Medications were educated on with the daughter. Report given to the nurse.

## 2015-11-30 NOTE — Clinical Social Work Placement (Signed)
   CLINICAL SOCIAL WORK PLACEMENT  NOTE  Date:  11/30/2015  Patient Details  Name: Theresa Cortez MRN: [id number] Date of Birth: [birth year]  Clinical Social Work is seeking post-discharge placement for this patient at the Skilled  Nursing Facility level of care (*CSW will initial, date and re-position this form in  chart as items are completed):  Yes   Patient/family provided with [facility] Clinical Social Work Department's list of facilities offering this level of care within the geographic area requested by the patient (or if unable, by the patient's family).  Yes   Patient/family informed of their freedom to choose among providers that offer the needed level of care, that participate in Medicare, Medicaid or managed care program needed by the patient, have an available bed and are willing to accept the patient.  Yes   Patient/family informed of [facility]'s ownership interest in [facility] and [facility], as well as of the fact that they are under no obligation to receive care at these facilities.  PASRR submitted to EDS on       PASRR number received on       Existing PASRR number confirmed on 11/29/15     FL2 transmitted to all facilities in geographic area requested by pt/family on 11/29/15     FL2 transmitted to all facilities within larger geographic area on       Patient informed that his/her managed care company has contracts with or will negotiate with certain facilities, including the following:            Patient/family informed of bed offers received.  Patient chooses bed at [facility]     Physician recommends and patient chooses bed at      Patient to be transferred to [facility] on 11/30/15.  Patient to be transferred to facility by [facility]tar     Patient family notified on 11/30/15 of transfer.  Name of family member notified:  charlean     PHYSICIAN Please sign FL2     Additional Comment:     _______________________________________________ [name], LCSW 11/30/2015, 10:25 AM

## 2015-11-30 NOTE — Care Management Note (Signed)
Case Management Note Donn Pierini RN, BSN Unit 2W-Case Manager [phone]  Patient Details  Name: Theresa Cortez MRN: [id number] Date of Birth: 1930-04-05  Subjective/Objective:  Pt admitted with Acute HF                    Action/Plan: PTA pt lived at home with daughter, CM to follow for potential d/c needs  Expected Discharge Date:    11/30/15              Expected Discharge Plan:  Home w Home Health Services  In-House Referral:  Clinical Social Work  Discharge planning Services  CM Consult, Medication Assistance  Post Acute Care Choice:    Choice offered to:     DME Arranged:    DME Agency:     HH Arranged:    HH Agency:     Status of Service:  Completed, signed off  Medicare Important Message Given:  Yes Date Medicare IM Given:    Medicare IM give by:    Date Additional Medicare IM Given:    Additional Medicare Important Message give by:     If discussed at Long Length of Stay Meetings, dates discussed:  11/30/15  Discharge Disposition: skilled facility   Additional Comments:  11/30/15- 1100- Donn Pierini RN ,BSN- pt and daughter have decided to d/c to [facility] prior to returning home with daughter- CSW consulted and working on placement- pt for d/c today to SNF  11/26/15- 1400- Atira Borello RN, BSN- pt has been started on [employer] completed- spoke with pt and family at bedside to share info- Per rep at [employer]:   Price quoted at [employer] in [place]  $8.25 for 30 day retail/ $8.25 for 90 day retail   auth required [phone] or fax form found on [employer] to [phone]   Pt given 30 day free card along with paperwork from [employer] for insurance auth- - per conversation with pt/family pt has needed DME at home that includes cane, RW, lift chair, adjustable bed, BSC, elevated commode- no further DME needs noted at this time- pt states she does not currently have any HH following. CM to continue to follow for  d/c needs  Darrold Span, RN 11/30/2015, 10:58 AM Case Management Note  Patient Details  Name: [name] MRN: [id number] Date of Birth: Sep 02, 1930  Subjective/Objective:                    Action/Plan:   Expected Discharge Date:                  Expected Discharge Plan:  Home w Home Health Services  In-House Referral:  Clinical Social Work  Discharge planning Services  CM Consult, Medication Assistance  Post Acute Care Choice:    Choice offered to:     DME Arranged:    DME Agency:     HH Arranged:    HH Agency:     Status of Service:  Completed, signed off  Medicare Important Message Given:  Yes Date Medicare IM Given:    Medicare IM give by:    Date Additional Medicare IM Given:    Additional Medicare Important Message give by:     If discussed at Long Length of Stay Meetings, dates discussed:    Additional Comments:  Darrold Span, RN 11/30/2015, 10:58 AM

## 2015-11-30 NOTE — Progress Notes (Signed)
Patient will discharge to [facility] Anticipated discharge date: 3/21 Family notified: charlean- at bedside Transportation by [facility]- called at 10:20am  CSW signing off.  Merlyn Lot, LCSWA Clinical Social Worker [phone]

## 2015-12-06 ENCOUNTER — Ambulatory Visit (INDEPENDENT_AMBULATORY_CARE_PROVIDER_SITE_OTHER): Payer: Medicare Other | Admitting: Cardiovascular Disease

## 2015-12-06 ENCOUNTER — Telehealth: Payer: Self-pay

## 2015-12-06 ENCOUNTER — Other Ambulatory Visit: Payer: Self-pay | Admitting: *Deleted

## 2015-12-06 ENCOUNTER — Ambulatory Visit
Admission: RE | Admit: 2015-12-06 | Discharge: 2015-12-06 | Disposition: A | Discharge status: Home / Self Care | Payer: Medicare Other | Source: Ambulatory Visit | Attending: Cardiovascular Disease | Admitting: Cardiovascular Disease

## 2015-12-06 ENCOUNTER — Encounter: Payer: Self-pay | Admitting: Cardiovascular Disease

## 2015-12-06 VITALS — BP 82/56 | HR 98 | Ht 66.0 in | Wt 152.4 lb

## 2015-12-06 DIAGNOSIS — N183 Chronic kidney disease, stage 3 unspecified: Secondary | ICD-10-CM | POA: Insufficient documentation

## 2015-12-06 DIAGNOSIS — I4891 Unspecified atrial fibrillation: Secondary | ICD-10-CM

## 2015-12-06 DIAGNOSIS — R059 Cough, unspecified: Secondary | ICD-10-CM

## 2015-12-06 DIAGNOSIS — I272 Other secondary pulmonary hypertension: Secondary | ICD-10-CM

## 2015-12-06 DIAGNOSIS — Z79899 Other long term (current) drug therapy: Secondary | ICD-10-CM

## 2015-12-06 DIAGNOSIS — Z794 Long term (current) use of insulin: Secondary | ICD-10-CM

## 2015-12-06 DIAGNOSIS — E119 Type 2 diabetes mellitus without complications: Secondary | ICD-10-CM | POA: Insufficient documentation

## 2015-12-06 DIAGNOSIS — R05 Cough: Secondary | ICD-10-CM

## 2015-12-06 DIAGNOSIS — I5041 Acute combined systolic (congestive) and diastolic (congestive) heart failure: Secondary | ICD-10-CM | POA: Diagnosis not present

## 2015-12-06 DIAGNOSIS — I10 Essential (primary) hypertension: Secondary | ICD-10-CM

## 2015-12-06 DIAGNOSIS — N179 Acute kidney failure, unspecified: Secondary | ICD-10-CM | POA: Insufficient documentation

## 2015-12-06 DIAGNOSIS — I340 Nonrheumatic mitral (valve) insufficiency: Secondary | ICD-10-CM

## 2015-12-06 DIAGNOSIS — E1122 Type 2 diabetes mellitus with diabetic chronic kidney disease: Secondary | ICD-10-CM

## 2015-12-06 LAB — BASIC METABOLIC PANEL
BUN: 15 mg/dL (ref 7–25)
CALCIUM: 8.8 mg/dL (ref 8.6–10.4)
CO2: 22 mmol/L (ref 20–31)
Chloride: 94 mmol/L — ABNORMAL LOW (ref 98–110)
Creat: 1.3 mg/dL — ABNORMAL HIGH (ref 0.60–0.88)
Glucose, Bld: 109 mg/dL — ABNORMAL HIGH (ref 65–99)
Potassium: 5.2 mmol/L (ref 3.5–5.3)
SODIUM: 133 mmol/L — AB (ref 135–146)

## 2015-12-06 LAB — BRAIN NATRIURETIC PEPTIDE: BRAIN NATRIURETIC PEPTIDE: 691.6 pg/mL — AB (ref <100)

## 2015-12-06 MED ORDER — FUROSEMIDE 40 MG PO TABS: 40.0000 mg | ORAL_TABLET | ORAL | Status: DC

## 2015-12-06 MED ORDER — LEVOFLOXACIN 500 MG PO TABS: 500.0000 mg | ORAL_TABLET | Freq: Every day | ORAL | Status: DC

## 2015-12-06 MED ORDER — GUAIFENESIN-CODEINE 100-10 MG/5ML PO SYRP: 10.0000 mL | ORAL_SOLUTION | Freq: Every evening | ORAL | Status: DC | PRN

## 2015-12-06 NOTE — Patient Instructions (Addendum)
Medication Instructions: Dr Royann Shivers has recommended making the following medication changes: 1. DECREASE Furosemide to 1 tablet by mouth every OTHER day 2. TAKE Robitussin AC - 10 mL by mouth at bedtime as needed for cough  Labwork: Your physician recommends that you return for lab work at your convenience - BMP and BNP.  Your physician recommends that you have a chest x-ray. A chest x-ray takes a picture of the organs and structures inside the chest, including the heart, lungs, and blood vessels. This test can show several things, including, whether the heart is enlarges; whether fluid is building up in the lungs; and whether pacemaker / defibrillator leads are still in place.  Testing/Procedures: NONE  Follow-up: Dr Royann Shivers recommends that you schedule a follow-up appointment in 2 weeks with him or with a NP/PA.  If you need a refill on your cardiac medications before your next appointment, please call your pharmacy.

## 2015-12-06 NOTE — Telephone Encounter (Signed)
-----   Message from Thurmon Fair, MD sent at 12/06/2015  5:43 PM EDT ----- Please start Levaquin 500 mg daily for 7 days

## 2015-12-06 NOTE — Addendum Note (Signed)
Addended by: Neta Ehlers on: 12/06/2015 02:17 PM   Modules accepted: Orders

## 2015-12-06 NOTE — Telephone Encounter (Signed)
Called [facility]. Will fax signed prescription to the fax number provided, [phone].

## 2015-12-06 NOTE — Progress Notes (Addendum)
Patient ID: Theresa Cortez, female   DOB: 02-19-1930, 82 y.o.   MRN: [id number]    Cardiology Office Note    Date:  12/06/2015   ID:  Theresa, Cortez July 11, 1930, MRN [id number]  PCP:  Theresa Bender, MD  Cardiologist:   Theresa Fair, MD   Chief Complaint  Patient presents with  . Follow-up    post hosp--CATH  patient reports heart fluttering, back pain, and persistant, productive cough.    History of Present Illness:  Theresa Cortez is a 82 y.o. female recently hospitalized with new onset congestive heart failure, returning for follow-up. She has severely depressed left ventricular systolic function with ejection fraction of 25-30 %, no significant coronary artery stenoses by cardiac cath, moderate to severe mitral insufficiency and recurrent paroxysmal atrial flutter and atrial fibrillation. After discharge from the hospital she has been at [facility] nursing home for recovery. She is taking amiodarone and renal dose adjusted Eliquis. She remains on oral loop diuretics, but other heart failure medications have been held due to renal insufficiency and hypotension. She has diabetes mellitus on insulin and oral antidiabetics and takes methotrexate and hydroxychloroquine for arthritis.  When she presented with congestive heart failure she weighed 186 pounds. At the time of hospital discharge she was 167 pounds. Today she only weighs 152 pounds. She does not have any edema. She is hypotensive, but asymptomatic. Her cough had resolved but has recurred over the last couple of days and keeps her awake at night despite [employer]. She denies fever chills sputum production or hemoptysis. She has had recurrent problems with heart fluttering and has a persistent nagging discomfort to the right of her spine in the midthoracic area that also prevents her from sleeping at night. Prior to hospital discharge on March 19 a thoracic spine x-ray did not show any meaningful abnormalities and her lungs were  clear on chest x-ray on March 16. She had a normal VQ scan on March 14 on admission to the hospital.    Past Medical History  Diagnosis Date  . Hypertension   . Wound infection (HCC)     Right knee   . Hypothyroidism   . History of diverticulosis   . Hyperlipidemia   . Type II diabetes mellitus (HCC)   . History of blood transfusion X 2    w/knee replacement  . Stroke [facility]) 2000    denies residual on 11/23/2015  . Rheumatoid arthritis(714.0)   . DJD (degenerative joint disease)   . Arthritis     "hands, arms" (11/23/2015)    Past Surgical History  Procedure Laterality Date  . Knee arthroscopy Right   . Total knee arthroplasty Right 08/07/2011; 2014  . Partial colectomy      descending colon by Dr. Orson Slick  . Appendectomy    . Medial partial knee replacement Right   . Incision and drainage mouth Right 2014    "knee; took replacement out; put  spacers in"  . Tubal ligation    . Cataract extraction w/ intraocular lens implant Right   . Cardiac catheterization N/A 11/25/2015    Procedure: Right/Left Heart Cath and Coronary Angiography;  Surgeon: Iran Ouch, MD;  Location: [facility] INV[facility] LAB;  Service: Cardiovascular;  Laterality: N/A;    Outpatient Prescriptions Prior to Visit  Medication Sig Dispense Refill  . amiodarone (PACERONE) 200 MG tablet Take 1 tablet (200 mg total) by mouth 2 (two) times daily.    Marland Kitchen apixaban (ELIQUIS) 2.5 MG TABS tablet  Take 1 tablet (2.5 mg total) by mouth 2 (two) times daily. 60 tablet   . atorvastatin (LIPITOR) 10 MG tablet Take 10 mg by mouth daily.    . benzonatate (TESSALON) 200 MG capsule Take 200 mg by mouth every 8 (eight) hours as needed for cough.    . diclofenac sodium (VOLTAREN) 1 % GEL Apply 4 g topically 4 (four) times daily.    Marland Kitchen docusate sodium (COLACE) 100 MG capsule Take 2 capsules (200 mg total) by mouth 2 (two) times daily. 10 capsule 0  . folic acid (FOLVITE) 1 MG tablet Take 1 mg by mouth daily.    . hydroxychloroquine  (PLAQUENIL) 200 MG tablet Take 200 mg by mouth 2 (two) times daily.    . insulin aspart (NOVOLOG) 100 UNIT/ML injection Before each meal 3 times a day, 140-199 - 2 units, 200-250 - 4 units, 251-299 - 6 units,  300-349 - 8 units,  350 or above 10 units. Dispense syringes and needles as needed, Ok to switch to PEN if approved. Substitute to any brand approved. DX DM2, Code E11.65 1 vial 12  . latanoprost (XALATAN) 0.005 % ophthalmic solution Place 1 drop into both eyes at bedtime.    Marland Kitchen levothyroxine (SYNTHROID, LEVOTHROID) 50 MCG tablet Take 75 mcg by mouth daily.     . methotrexate (RHEUMATREX) 2.5 MG tablet Take 25 mg by mouth once a week. Caution:Chemotherapy. Protect from light. On Friday    . sitaGLIPtin (JANUVIA) 50 MG tablet Take 50 mg by mouth daily.    . furosemide (LASIX) 40 MG tablet Take 1 tablet (40 mg total) by mouth daily.     No facility-administered medications prior to visit.     Allergies:   Penicillins   Social History   Social History  . Marital Status: Widowed    Spouse Name: N/A  . Number of Children: N/A  . Years of Education: N/A   Social History Main Topics  . Smoking status: Never Smoker   . Smokeless tobacco: Former Neurosurgeon    Types: Snuff  . Alcohol Use: No  . Drug Use: No  . Sexual Activity: No   Other Topics Concern  . None   Social History Narrative     Family History:  The patient's family history includes Hypertension in her mother.   ROS:   Please see the history of present illness.    ROS All other systems reviewed and are negative.   PHYSICAL EXAM:   VS:  BP 82/56 mmHg  Pulse 98  Ht 5\' 6"  (1.676 m)  Wt 69.128 kg (152 lb 6.4 oz)  BMI 24.61 kg/m2   GEN: Well nourished, well developed, in no acute distress HEENT: normal Neck: no JVD, carotid bruits, or masses Cardiac: RRR paradoxically split S2; grade 1/6 holosystolic apical murmur, no diastolic murmurs, rubs, I do not appreciate any gallops,no edema  Respiratory:  clear to  auscultation bilaterally, normal work of breathing GI: soft, nontender, nondistended, + BS MS: no deformity or atrophy Skin: warm and dry, no rash Neuro:  Alert and Oriented x 3, Strength and sensation are intact Psych: euthymic mood, full affect  Wt Readings from Last 3 Encounters:  12/06/15 69.128 kg (152 lb 6.4 oz)  11/30/15 76 kg (167 lb 8.8 oz)  09/09/12 83.915 kg (185 lb)      Studies/Labs Reviewed:   EKG:  EKG is ordered today.  The ekg ordered today demonstrates Sinus rhythm withatypical left bundle branch block, QRS 154 ms, QTC  543 ms   Recent Labs: 11/22/2015: B Natriuretic Peptide 1732.2* 11/23/2015: TSH 1.242 11/25/2015: ALT 208* 11/26/2015: Hemoglobin 10.9*; Platelets 180 11/29/2015: BUN 22*; Creatinine, Ser 1.70*; Magnesium 2.1; Potassium 4.0; Sodium 133*     ASSESSMENT:    1. Acute combined systolic and diastolic heart failure (HCC)   2. Atrial fibrillation with rapid ventricular response (HCC)   3. Mitral regurgitation   4. Pulmonary hypertension (HCC)   5. Essential hypertension   6. Type 2 diabetes mellitus with stage 3 chronic kidney disease, with long-term current use of insulin (HCC)   7. CKD (chronic kidney disease), stage III   8. Cough   9. Medication management      PLAN:  In order of problems listed above:  1. CHF: Due to nonischemic cardiomyopathy, currently appears clinically euvolemic except for the presence of cough. She may be over diuresis which is causing hypotension. Repeat renal function tests and BNP today. Reduce furosemide to 40 mg every other day. Continue daily weights. Have nursing home call us if weights exceed 155 pounds. Low blood pressure precludes use of beta blockers or RAAS inhibitors.  2. AFib: Currently in sinus rhythm on loading dose amiodarone. Would plan to reduce the dose of amiodarone to 200 mg daily in another couple of weeks. CHADVasc 5 (age 66, HF, HTN, DM). Continue Eliquis. 3. MR: The fact that the murmur seems less  prominent after aggressive diuresis suggested this may be secondary/functional MR rather than the cause of her cardiomyopathy. Plan to reevaluate echo in another couple of months 4. PAH: Secondary to left heart failure 5. HTN: currently hypotensive, possibly due to excessive diuresis 6. DM: A1c most recently 7.3% 7. CKD: Creatinine was 1.63 on admission, increased to maximum of 2.16 during hospitalization, 1.70 at discharge. Hard to say what her baseline is. In 2013 her creatinine was normal. Based on current data GFR is estimated at around 30 mL/minute. 8. Cough: We'll try to see if codeine helps her rest better at night to suppress both her cough and her back pain. Will have to review her chest x-ray. At this point doubt her cough is due to amiodarone toxicity. May have to consider CT of the chest with contrast, need to keep in mind risk of contrast-induced nephrotoxicity.  Medication Adjustments/Labs and Tests Ordered: Current medicines are reviewed at length with the patient today.  Concerns regarding medicines are outlined above.  Medication changes, Labs and Tests ordered today are listed in the Patient Instructions below. Patient Instructions  Medication Instructions: Dr Royann Shivers has recommended making the following medication changes: 1. DECREASE Furosemide to 1 tablet by mouth every OTHER day 2. TAKE Robitussin AC - 10 mL by mouth at bedtime as needed for cough  Labwork: Your physician recommends that you return for lab work at your convenience - BMP and BNP.  Your physician recommends that you have a chest x-ray. A chest x-ray takes a picture of the organs and structures inside the chest, including the heart, lungs, and blood vessels. This test can show several things, including, whether the heart is enlarges; whether fluid is building up in the lungs; and whether pacemaker / defibrillator leads are still in place.  Testing/Procedures: NONE  Follow-up: Dr Royann Shivers recommends that  you schedule a follow-up appointment in 2 weeks with him or with a NP/PA.  If you need a refill on your cardiac medications before your next appointment, please call your pharmacy.       Joie Bimler, MD  12/06/2015 1:41  PM    [facility] [address], [place], [place]  [address] Phone: [phone]; Fax: [phone]

## 2015-12-12 ENCOUNTER — Emergency Department (HOSPITAL_COMMUNITY): Payer: Medicare Other

## 2015-12-12 ENCOUNTER — Other Ambulatory Visit: Payer: Self-pay

## 2015-12-12 ENCOUNTER — Encounter (HOSPITAL_COMMUNITY): Payer: Self-pay

## 2015-12-12 ENCOUNTER — Emergency Department (HOSPITAL_COMMUNITY)
Admission: EM | Admit: 2015-12-12 | Discharge: 2015-12-13 | Disposition: A | Discharge status: Home / Self Care | Payer: Medicare Other | Source: Home / Self Care | Attending: Emergency Medicine | Admitting: Emergency Medicine

## 2015-12-12 DIAGNOSIS — M1990 Unspecified osteoarthritis, unspecified site: Secondary | ICD-10-CM | POA: Insufficient documentation

## 2015-12-12 DIAGNOSIS — E871 Hypo-osmolality and hyponatremia: Secondary | ICD-10-CM

## 2015-12-12 DIAGNOSIS — Z791 Long term (current) use of non-steroidal anti-inflammatories (NSAID): Secondary | ICD-10-CM | POA: Insufficient documentation

## 2015-12-12 DIAGNOSIS — N179 Acute kidney failure, unspecified: Secondary | ICD-10-CM | POA: Diagnosis not present

## 2015-12-12 DIAGNOSIS — Z880 Allergy status to penicillin: Secondary | ICD-10-CM

## 2015-12-12 DIAGNOSIS — E119 Type 2 diabetes mellitus without complications: Secondary | ICD-10-CM

## 2015-12-12 DIAGNOSIS — E785 Hyperlipidemia, unspecified: Secondary | ICD-10-CM | POA: Insufficient documentation

## 2015-12-12 DIAGNOSIS — Z794 Long term (current) use of insulin: Secondary | ICD-10-CM

## 2015-12-12 DIAGNOSIS — Z8673 Personal history of transient ischemic attack (TIA), and cerebral infarction without residual deficits: Secondary | ICD-10-CM | POA: Insufficient documentation

## 2015-12-12 DIAGNOSIS — R112 Nausea with vomiting, unspecified: Secondary | ICD-10-CM | POA: Diagnosis not present

## 2015-12-12 DIAGNOSIS — E039 Hypothyroidism, unspecified: Secondary | ICD-10-CM | POA: Insufficient documentation

## 2015-12-12 DIAGNOSIS — Z79899 Other long term (current) drug therapy: Secondary | ICD-10-CM

## 2015-12-12 DIAGNOSIS — I10 Essential (primary) hypertension: Secondary | ICD-10-CM | POA: Insufficient documentation

## 2015-12-12 DIAGNOSIS — R109 Unspecified abdominal pain: Secondary | ICD-10-CM

## 2015-12-12 DIAGNOSIS — R10A0 Flank pain, unspecified side: Secondary | ICD-10-CM

## 2015-12-12 LAB — COMPREHENSIVE METABOLIC PANEL
ALBUMIN: 2.6 g/dL — AB (ref 3.5–5.0)
ALT: 34 U/L (ref 14–54)
AST: 35 U/L (ref 15–41)
Alkaline Phosphatase: 110 U/L (ref 38–126)
Anion gap: 10 (ref 5–15)
BUN: 15 mg/dL (ref 6–20)
CHLORIDE: 89 mmol/L — AB (ref 101–111)
CO2: 25 mmol/L (ref 22–32)
Calcium: 8.8 mg/dL — ABNORMAL LOW (ref 8.9–10.3)
Creatinine, Ser: 1.71 mg/dL — ABNORMAL HIGH (ref 0.44–1.00)
GFR calc Af Amer: 31 mL/min — ABNORMAL LOW (ref >60)
GFR, EST NON AFRICAN AMERICAN: 26 mL/min — AB (ref >60)
Glucose, Bld: 145 mg/dL — ABNORMAL HIGH (ref 65–99)
POTASSIUM: 5.3 mmol/L — AB (ref 3.5–5.1)
Sodium: 124 mmol/L — ABNORMAL LOW (ref 135–145)
Total Bilirubin: 1 mg/dL (ref 0.3–1.2)
Total Protein: 6.7 g/dL (ref 6.5–8.1)

## 2015-12-12 LAB — CBC WITH DIFFERENTIAL/PLATELET
BASOS ABS: 0.1 10*3/uL (ref 0.0–0.1)
BASOS PCT: 1 %
EOS ABS: 0 10*3/uL (ref 0.0–0.7)
EOS PCT: 0 %
HCT: 30.5 % — ABNORMAL LOW (ref 36.0–46.0)
Hemoglobin: 10.2 g/dL — ABNORMAL LOW (ref 12.0–15.0)
Lymphocytes Relative: 14 %
Lymphs Abs: 1 10*3/uL (ref 0.7–4.0)
MCH: 31.8 pg (ref 26.0–34.0)
MCHC: 33.4 g/dL (ref 30.0–36.0)
MCV: 95 fL (ref 78.0–100.0)
MONO ABS: 0.9 10*3/uL (ref 0.1–1.0)
Monocytes Relative: 13 %
Neutro Abs: 5.1 10*3/uL (ref 1.7–7.7)
Neutrophils Relative %: 72 %
PLATELETS: 310 10*3/uL (ref 150–400)
RBC: 3.21 MIL/uL — ABNORMAL LOW (ref 3.87–5.11)
RDW: 17 % — AB (ref 11.5–15.5)
WBC: 7.2 10*3/uL (ref 4.0–10.5)

## 2015-12-12 LAB — URINALYSIS, ROUTINE W REFLEX MICROSCOPIC
Bilirubin Urine: NEGATIVE
GLUCOSE, UA: NEGATIVE mg/dL
Hgb urine dipstick: NEGATIVE
KETONES UR: NEGATIVE mg/dL
NITRITE: NEGATIVE
PROTEIN: NEGATIVE mg/dL
Specific Gravity, Urine: 1.012 (ref 1.005–1.030)
pH: 5 (ref 5.0–8.0)

## 2015-12-12 LAB — URINE MICROSCOPIC-ADD ON: RBC / HPF: NONE SEEN RBC/hpf (ref 0–5)

## 2015-12-12 MED ORDER — ONDANSETRON HCL 4 MG/2ML IJ SOLN
4.0000 mg | Freq: Once | INTRAMUSCULAR | Status: AC
  Administered 2015-12-12: 4 mg via INTRAVENOUS
  Filled 2015-12-12: qty 2

## 2015-12-12 MED ORDER — SODIUM CHLORIDE 0.9 % IV BOLUS (SEPSIS)
500.0000 mL | Freq: Once | INTRAVENOUS | Status: AC
  Administered 2015-12-12: 500 mL via INTRAVENOUS

## 2015-12-12 NOTE — ED Notes (Signed)
Patient transported to CT 

## 2015-12-12 NOTE — ED Provider Notes (Addendum)
CSN: [id number]     Arrival date & time 12/12/15  1937 History   First MD Initiated Contact with Patient 12/12/15 2019     Chief Complaint  Patient presents with  . Flank Pain    right     (Consider location/radiation/quality/duration/timing/severity/associated sxs/prior Treatment) HPI     This is a 82 year old woman who was recently discharged to rehabilitation facility after admission for pneumonia. She has had some right flank pain intermittently over the past week. He is having a difficult time telling me exactly why he what exacerbates it. She has not noted any relieving symptoms. She has not had any increased dyspnea, fever, chills, urinary tract infection symptoms, or blood in her urine. She has not had similar symptoms in the past.  Past Medical History  Diagnosis Date  . Hypertension   . Wound infection (HCC)     Right knee   . Hypothyroidism   . History of diverticulosis   . Hyperlipidemia   . Type II diabetes mellitus (HCC)   . History of blood transfusion X 2    w/knee replacement  . Stroke [facility]) 2000    denies residual on 11/23/2015  . Rheumatoid arthritis(714.0)   . DJD (degenerative joint disease)   . Arthritis     "hands, arms" (11/23/2015)   Past Surgical History  Procedure Laterality Date  . Knee arthroscopy Right   . Total knee arthroplasty Right 08/07/2011; 2014  . Partial colectomy      descending colon by Dr. Orson Slick  . Appendectomy    . Medial partial knee replacement Right   . Incision and drainage mouth Right 2014    "knee; took replacement out; put  spacers in"  . Tubal ligation    . Cataract extraction w/ intraocular lens implant Right   . Cardiac catheterization N/A 11/25/2015    Procedure: Right/Left Heart Cath and Coronary Angiography;  Surgeon: Iran Ouch, MD;  Location: [facility] INV[facility] CV LAB;  Service: Cardiovascular;  Laterality: N/A;   Family History  Problem Relation Age of Onset  . Hypertension Mother    Social History  Substance  Use Topics  . Smoking status: Never Smoker   . Smokeless tobacco: Former Neurosurgeon    Types: Snuff  . Alcohol Use: No   OB History    No data available     Review of Systems  All other systems reviewed and are negative.     Allergies  Penicillins  Home Medications   Prior to Admission medications   Medication Sig Start Date End Date Taking? Authorizing Provider  acetaminophen (TYLENOL) 325 MG tablet Take 650 mg by mouth every 4 (four) hours as needed for moderate pain.   Yes Historical Provider, MD  alum & mag hydroxide-simeth (MAALOX/MYLANTA) 200-200-20 MG/5ML suspension Take 30 mLs by mouth every 6 (six) hours as needed for indigestion.   Yes Historical Provider, MD  amiodarone (PACERONE) 200 MG tablet Take 1 tablet (200 mg total) by mouth 2 (two) times daily. 11/30/15  Yes Leroy Sea, MD  apixaban (ELIQUIS) 2.5 MG TABS tablet Take 1 tablet (2.5 mg total) by mouth 2 (two) times daily. 11/30/15  Yes Leroy Sea, MD  atorvastatin (LIPITOR) 10 MG tablet Take 10 mg by mouth daily at 6 PM.    Yes Historical Provider, MD  benzonatate (TESSALON) 200 MG capsule Take 200 mg by mouth every 8 (eight) hours as needed for cough.   Yes Historical Provider, MD  diclofenac sodium (VOLTAREN) 1 % GEL  Apply 4 g topically 4 (four) times daily.   Yes Historical Provider, MD  docusate sodium (COLACE) 100 MG capsule Take 2 capsules (200 mg total) by mouth 2 (two) times daily. 11/30/15  Yes Leroy Sea, MD  folic acid (FOLVITE) 1 MG tablet Take 1 mg by mouth daily.   Yes Historical Provider, MD  furosemide (LASIX) 40 MG tablet Take 1 tablet (40 mg total) by mouth every other day. 12/06/15  Yes Mihai Croitoru, MD  guaifenesin (ROBITUSSIN) 100 MG/5ML syrup Take 200 mg by mouth at bedtime as needed for cough.   Yes Historical Provider, MD  guaiFENesin-codeine (ROBITUSSIN AC) 100-10 MG/5ML syrup Take 10 mLs by mouth at bedtime as needed for cough. 12/06/15  Yes Mihai Croitoru, MD  hydroxychloroquine  (PLAQUENIL) 200 MG tablet Take 200 mg by mouth 2 (two) times daily.   Yes Historical Provider, MD  insulin aspart (NOVOLOG) 100 UNIT/ML injection Before each meal 3 times a day, 140-199 - 2 units, 200-250 - 4 units, 251-299 - 6 units,  300-349 - 8 units,  350 or above 10 units. Dispense syringes and needles as needed, Ok to switch to PEN if approved. Substitute to any brand approved. DX DM2, Code E11.65 11/30/15  Yes Leroy Sea, MD  latanoprost (XALATAN) 0.005 % ophthalmic solution Place 1 drop into both eyes at bedtime.   Yes Historical Provider, MD  levofloxacin (LEVAQUIN) 500 MG tablet Take 1 tablet (500 mg total) by mouth daily. For 7 days. Patient taking differently: Take 500 mg by mouth daily. For 7 days. Ending 12/14/15 12/06/15  Yes Mihai Croitoru, MD  levothyroxine (SYNTHROID, LEVOTHROID) 75 MCG tablet Take 75 mcg by mouth daily before breakfast.   Yes Historical Provider, MD  methotrexate (RHEUMATREX) 2.5 MG tablet Take 2.5 mg by mouth every Friday. Caution:Chemotherapy. Protect from light. On Friday   Yes Historical Provider, MD  sitaGLIPtin (JANUVIA) 50 MG tablet Take 50 mg by mouth daily.   Yes Historical Provider, MD  sodium phosphate (FLEET) enema Place 1 enema rectally once. follow package directions   Yes Historical Provider, MD   BP 93/60 mmHg  Pulse 93  Temp(Src) 97.7 F (36.5 C) (Oral)  Resp 23  Ht 5\' 6"  (1.676 m)  Wt 75.751 kg  BMI 26.97 kg/m2  SpO2 100% Physical Exam  Constitutional: She is oriented to person, place, and time. She appears well-developed and well-nourished.  Patient with systolic blood pressures in the 90s to lower 100s.  HENT:  Head: Normocephalic and atraumatic.  Right Ear: External ear normal.  Left Ear: External ear normal.  Nose: Nose normal.  Mouth/Throat: Oropharynx is clear and moist.  Eyes: Conjunctivae and EOM are normal. Pupils are equal, round, and reactive to light.  Neck: Normal range of motion. Neck supple.  Cardiovascular: Normal  rate, regular rhythm, normal heart sounds and intact distal pulses.   Pulmonary/Chest: Effort normal and breath sounds normal.  Abdominal: Soft. Bowel sounds are normal.  Musculoskeletal: Normal range of motion.  Neurological: She is alert and oriented to person, place, and time. She has normal reflexes.  Skin: Skin is warm and dry.  Psychiatric: She has a normal mood and affect. Her behavior is normal. Judgment and thought content normal.  Nursing note and vitals reviewed.   ED Course  Procedures (including critical care time) Labs Review Labs Reviewed  CBC WITH DIFFERENTIAL/PLATELET - Abnormal; Notable for the following:    RBC 3.21 (*)    Hemoglobin 10.2 (*)    HCT 30.5 (*)  RDW 17.0 (*)    All other components within normal limits  URINALYSIS, ROUTINE W REFLEX MICROSCOPIC (NOT AT [facility]) - Abnormal; Notable for the following:    APPearance CLOUDY (*)    Leukocytes, UA TRACE (*)    All other components within normal limits  COMPREHENSIVE METABOLIC PANEL - Abnormal; Notable for the following:    Sodium 124 (*)    Potassium 5.3 (*)    Chloride 89 (*)    Glucose, Bld 145 (*)    Creatinine, Ser 1.71 (*)    Calcium 8.8 (*)    Albumin 2.6 (*)    GFR calc non Af Amer 26 (*)    GFR calc Af Amer 31 (*)    All other components within normal limits  URINE MICROSCOPIC-ADD ON - Abnormal; Notable for the following:    Squamous Epithelial / LPF 0-5 (*)    Bacteria, UA RARE (*)    Casts HYALINE CASTS (*)    All other components within normal limits    Imaging Review Ct Abdomen Pelvis Wo Contrast  12/12/2015  CLINICAL DATA:  Acute onset of right flank pain and increased generalized weakness. Initial encounter. EXAM: CT ABDOMEN AND PELVIS WITHOUT CONTRAST TECHNIQUE: Multidetector CT imaging of the abdomen and pelvis was performed following the standard protocol without IV contrast. COMPARISON:  CT of the abdomen and pelvis performed 12/28/2004, and right upper quadrant ultrasound  performed 11/23/2015 FINDINGS: Bibasilar bronchiectasis is noted. Patchy bibasilar opacities likely reflect atelectasis or scarring. The heart is mildly enlarged. The liver and spleen are unremarkable in appearance. The gallbladder is within normal limits. The pancreas. Is unremarkable. There is prominence of the adrenal glands, concerning for mild adrenal hyperplasia. The kidneys are unremarkable in appearance. There is no evidence of hydronephrosis. No renal or ureteral stones are seen. No perinephric stranding is appreciated. No free fluid is identified. The small bowel is unremarkable in appearance. The stomach is within normal limits. No acute vascular abnormalities are seen. Mild scattered calcification is seen along the abdominal aorta and its branches. The patient is status post appendectomy. The colon is unremarkable in appearance. The bladder is mildly distended and grossly remarkable. A small fibroid is suggested at the lower uterine segment. The uterus is otherwise unremarkable. The ovaries are grossly symmetric, aside from a likely 2.5 cm left adnexal cyst. No inguinal lymphadenopathy is seen. No acute osseous abnormalities are identified. Facet disease is noted at the lower lumbar spine. IMPRESSION: 1. No acute abnormality seen to explain the patient's symptoms. 2. Bibasilar bronchiectasis noted. Patchy bibasilar airspace opacities likely reflect atelectasis or scarring. 3. Prominence of the adrenal glands, concerning for mild adrenal hyperplasia. 4. Mild scattered calcification along the abdominal aorta and its branches. 5. Small uterine fibroid suggested. 6. Mild cardiomegaly. Electronically Signed   By: Roanna Raider M.D.   On: 12/12/2015 23:06   Dg Chest 2 View  12/12/2015  CLINICAL DATA:  Back and right sided flank pain for several days EXAM: CHEST  2 VIEW COMPARISON:  None. FINDINGS: Cardiac shadow is mildly enlarged. Chronic bibasilar opacities are noted. No focal infiltrate or sizable  effusion is noted. No bony abnormality is noted. IMPRESSION: Chronic changes without acute abnormality. Electronically Signed   By: Alcide Clever M.D.   On: 12/12/2015 21:26   I have personally reviewed and evaluated these images and lab results as part of my medical decision-making.   EKG Interpretation   Date/Time:  Sunday December 12 2015 20:35:28 EDT Ventricular Rate:  96  PR Interval:  198 QRS Duration: 156 QT Interval:  414 QTC Calculation: 523 R Axis:   -8 Text Interpretation:  Normal sinus rhythm Left bundle branch block  Confirmed by Ivanell Deshotel MD, Duwayne Heck ([id number]) on 12/12/2015 10:03:04 PM      MDM   Final diagnoses:  Abdominal pain    82 year old female right flank pain. CT shows no stone or definitive source of patient's pain. 1 right flank pain-patient is not appear to have any acute intra-abdominal pathology of source. Unclear etiology. An outpatient follow-up. 2 hyponatremia patient is sitting here of 124. Has been low at 1:30 in the past but is decreased more today. She does not appear to have any neurologic symptoms. She has received some IV normal saline here. She is in a home.  will need to be rechecked tomorrow. Chronic hyponatremia with superimposed acute. 3 low normal blood pressure-this appears to be consistent per previous blood pressures patient has had here in the ED. 4 some scattered scarring on chest x-Enes Wegener.-Patient with recent pneumonia and appears improved. 5-Patient with some possible urinary tract infection there are only 0-5 white blood cells and patient is on Levaquin. Margarita Grizzle, MD 12/12/15 2350  Margarita Grizzle, MD 12/12/15 [phone]

## 2015-12-12 NOTE — ED Notes (Signed)
Pt from [facility] for right flank pain and increased weakness. Pt released from the hospital on the 28th.

## 2015-12-12 NOTE — Discharge Instructions (Signed)
Flank Pain Flank pain refers to pain that is located on the side of the body between the upper abdomen and the back. The pain may occur over a short period of time (acute) or may be long-term or reoccurring (chronic). It may be mild or severe. Flank pain can be caused by many things. CAUSES  Some of the more common causes of flank pain include:  Muscle strains.   Muscle spasms.   A disease of your spine (vertebral disk disease).   A lung infection (pneumonia).   Fluid around your lungs (pulmonary edema).   A kidney infection.   Kidney stones.   A very painful skin rash caused by the chickenpox virus (shingles).   Gallbladder disease.  HOME CARE INSTRUCTIONS  Home care will depend on the cause of your pain. In general,  Rest as directed by your caregiver.  Drink enough fluids to keep your urine clear or pale yellow.  Only take over-the-counter or prescription medicines as directed by your caregiver. Some medicines may help relieve the pain.  Tell your caregiver about any changes in your pain.  Follow up with your caregiver as directed. SEEK IMMEDIATE MEDICAL CARE IF:   Your pain is not controlled with medicine.   You have new or worsening symptoms.  Your pain increases.   You have abdominal pain.   You have shortness of breath.   You have persistent nausea or vomiting.   You have swelling in your abdomen.   You feel faint or pass out.   You have blood in your urine.  You have a fever or persistent symptoms for more than 2-3 days.  You have a fever and your symptoms suddenly get worse. MAKE SURE YOU:   Understand these instructions.  Will watch your condition.  Will get help right away if you are not doing well or get worse.   This information is not intended to replace advice given to you by your health care provider. Make sure you discuss any questions you have with your health care provider.   Document Released: 10/19/2005 Document  Revised: 05/22/2012 Document Reviewed: 04/11/2012 Elsevier Interactive Patient Education 2016 Elsevier Inc. Hyponatremia Hyponatremia is when the amount of salt (sodium) in your blood is too low. When salt levels are low, your cells absorb extra water and they swell. The swelling happens throughout the body, but it mostly affects the brain.  HOME CARE  Take medicines only as told by your doctor. Many medicines can make this condition worse. Talk with your doctor about any medicines that you are currently taking.  Carefully follow a recommended diet as told by your doctor.  Carefully follow instructions from your doctor about fluid restrictions.  Keep all follow-up visits as told by your doctor. This is important.  Do not drink alcohol. GET HELP IF:  You feel sicker to your stomach (nauseous).  You feel more confused.  You feel more tired (fatigued).  Your headache gets worse.  You feel weaker.  Your symptoms go away and then they come back.  You have trouble following the diet instructions. GET HELP RIGHT AWAY IF:  You start to twitch and shake (have a seizure).  You pass out (faint).  You keep having watery poop (diarrhea).  You keep throwing up (vomiting).   This information is not intended to replace advice given to you by your health care provider. Make sure you discuss any questions you have with your health care provider.   Document Released: 05/10/2011 Document Revised:  01/12/2015 Document Reviewed: 08/24/2014 Elsevier Interactive Patient Education [employer].

## 2015-12-15 ENCOUNTER — Inpatient Hospital Stay (HOSPITAL_COMMUNITY): Payer: Medicare Other

## 2015-12-15 ENCOUNTER — Encounter (HOSPITAL_COMMUNITY): Payer: Self-pay | Admitting: Emergency Medicine

## 2015-12-15 ENCOUNTER — Inpatient Hospital Stay (HOSPITAL_COMMUNITY)
Admission: EM | Admit: 2015-12-15 | Discharge: 2015-12-20 | DRG: 682 | Disposition: A | Discharge status: Skilled Nursing Facility | Payer: Medicare Other | Attending: Internal Medicine | Admitting: Internal Medicine

## 2015-12-15 ENCOUNTER — Emergency Department (HOSPITAL_COMMUNITY): Payer: Medicare Other

## 2015-12-15 DIAGNOSIS — I272 Other secondary pulmonary hypertension: Secondary | ICD-10-CM | POA: Diagnosis present

## 2015-12-15 DIAGNOSIS — I5042 Chronic combined systolic (congestive) and diastolic (congestive) heart failure: Secondary | ICD-10-CM | POA: Diagnosis present

## 2015-12-15 DIAGNOSIS — R1084 Generalized abdominal pain: Secondary | ICD-10-CM | POA: Diagnosis not present

## 2015-12-15 DIAGNOSIS — Z880 Allergy status to penicillin: Secondary | ICD-10-CM | POA: Diagnosis not present

## 2015-12-15 DIAGNOSIS — I081 Rheumatic disorders of both mitral and tricuspid valves: Secondary | ICD-10-CM | POA: Diagnosis present

## 2015-12-15 DIAGNOSIS — M069 Rheumatoid arthritis, unspecified: Secondary | ICD-10-CM | POA: Diagnosis present

## 2015-12-15 DIAGNOSIS — I130 Hypertensive heart and chronic kidney disease with heart failure and stage 1 through stage 4 chronic kidney disease, or unspecified chronic kidney disease: Secondary | ICD-10-CM | POA: Diagnosis present

## 2015-12-15 DIAGNOSIS — M1990 Unspecified osteoarthritis, unspecified site: Secondary | ICD-10-CM | POA: Diagnosis present

## 2015-12-15 DIAGNOSIS — Z7901 Long term (current) use of anticoagulants: Secondary | ICD-10-CM | POA: Diagnosis not present

## 2015-12-15 DIAGNOSIS — E871 Hypo-osmolality and hyponatremia: Secondary | ICD-10-CM | POA: Diagnosis present

## 2015-12-15 DIAGNOSIS — I480 Paroxysmal atrial fibrillation: Secondary | ICD-10-CM | POA: Diagnosis present

## 2015-12-15 DIAGNOSIS — Z8673 Personal history of transient ischemic attack (TIA), and cerebral infarction without residual deficits: Secondary | ICD-10-CM

## 2015-12-15 DIAGNOSIS — R110 Nausea: Secondary | ICD-10-CM

## 2015-12-15 DIAGNOSIS — Z96651 Presence of right artificial knee joint: Secondary | ICD-10-CM | POA: Diagnosis present

## 2015-12-15 DIAGNOSIS — G43A0 Cyclical vomiting, not intractable: Secondary | ICD-10-CM | POA: Diagnosis not present

## 2015-12-15 DIAGNOSIS — D649 Anemia, unspecified: Secondary | ICD-10-CM | POA: Diagnosis present

## 2015-12-15 DIAGNOSIS — R69 Illness, unspecified: Secondary | ICD-10-CM

## 2015-12-15 DIAGNOSIS — J1000 Influenza due to other identified influenza virus with unspecified type of pneumonia: Secondary | ICD-10-CM | POA: Diagnosis present

## 2015-12-15 DIAGNOSIS — E860 Dehydration: Secondary | ICD-10-CM | POA: Diagnosis present

## 2015-12-15 DIAGNOSIS — Z9049 Acquired absence of other specified parts of digestive tract: Secondary | ICD-10-CM | POA: Diagnosis not present

## 2015-12-15 DIAGNOSIS — I10 Essential (primary) hypertension: Secondary | ICD-10-CM | POA: Diagnosis present

## 2015-12-15 DIAGNOSIS — N183 Chronic kidney disease, stage 3 unspecified: Secondary | ICD-10-CM | POA: Diagnosis present

## 2015-12-15 DIAGNOSIS — E1122 Type 2 diabetes mellitus with diabetic chronic kidney disease: Secondary | ICD-10-CM | POA: Diagnosis present

## 2015-12-15 DIAGNOSIS — Z8249 Family history of ischemic heart disease and other diseases of the circulatory system: Secondary | ICD-10-CM

## 2015-12-15 DIAGNOSIS — J189 Pneumonia, unspecified organism: Secondary | ICD-10-CM | POA: Diagnosis present

## 2015-12-15 DIAGNOSIS — R109 Unspecified abdominal pain: Secondary | ICD-10-CM | POA: Insufficient documentation

## 2015-12-15 DIAGNOSIS — T501X5A Adverse effect of loop [high-ceiling] diuretics, initial encounter: Secondary | ICD-10-CM | POA: Diagnosis present

## 2015-12-15 DIAGNOSIS — T502X5A Adverse effect of carbonic-anhydrase inhibitors, benzothiadiazides and other diuretics, initial encounter: Secondary | ICD-10-CM | POA: Diagnosis present

## 2015-12-15 DIAGNOSIS — R7989 Other specified abnormal findings of blood chemistry: Secondary | ICD-10-CM | POA: Diagnosis present

## 2015-12-15 DIAGNOSIS — I959 Hypotension, unspecified: Secondary | ICD-10-CM | POA: Diagnosis not present

## 2015-12-15 DIAGNOSIS — Z79899 Other long term (current) drug therapy: Secondary | ICD-10-CM | POA: Diagnosis not present

## 2015-12-15 DIAGNOSIS — J479 Bronchiectasis, uncomplicated: Secondary | ICD-10-CM | POA: Diagnosis present

## 2015-12-15 DIAGNOSIS — I071 Rheumatic tricuspid insufficiency: Secondary | ICD-10-CM | POA: Diagnosis present

## 2015-12-15 DIAGNOSIS — R1012 Left upper quadrant pain: Secondary | ICD-10-CM | POA: Diagnosis not present

## 2015-12-15 DIAGNOSIS — R1032 Left lower quadrant pain: Secondary | ICD-10-CM

## 2015-12-15 DIAGNOSIS — E872 Acidosis, unspecified: Secondary | ICD-10-CM | POA: Insufficient documentation

## 2015-12-15 DIAGNOSIS — E2740 Unspecified adrenocortical insufficiency: Secondary | ICD-10-CM | POA: Diagnosis present

## 2015-12-15 DIAGNOSIS — D689 Coagulation defect, unspecified: Secondary | ICD-10-CM | POA: Diagnosis present

## 2015-12-15 DIAGNOSIS — E875 Hyperkalemia: Secondary | ICD-10-CM | POA: Diagnosis present

## 2015-12-15 DIAGNOSIS — E119 Type 2 diabetes mellitus without complications: Secondary | ICD-10-CM

## 2015-12-15 DIAGNOSIS — E8809 Other disorders of plasma-protein metabolism, not elsewhere classified: Secondary | ICD-10-CM | POA: Diagnosis present

## 2015-12-15 DIAGNOSIS — T39395A Adverse effect of other nonsteroidal anti-inflammatory drugs [NSAID], initial encounter: Secondary | ICD-10-CM | POA: Diagnosis present

## 2015-12-15 DIAGNOSIS — Y95 Nosocomial condition: Secondary | ICD-10-CM | POA: Diagnosis present

## 2015-12-15 DIAGNOSIS — R569 Unspecified convulsions: Secondary | ICD-10-CM | POA: Diagnosis not present

## 2015-12-15 DIAGNOSIS — E440 Moderate protein-calorie malnutrition: Secondary | ICD-10-CM | POA: Diagnosis present

## 2015-12-15 DIAGNOSIS — N179 Acute kidney failure, unspecified: Secondary | ICD-10-CM | POA: Diagnosis not present

## 2015-12-15 DIAGNOSIS — J101 Influenza due to other identified influenza virus with other respiratory manifestations: Secondary | ICD-10-CM

## 2015-12-15 DIAGNOSIS — Z87891 Personal history of nicotine dependence: Secondary | ICD-10-CM | POA: Diagnosis not present

## 2015-12-15 DIAGNOSIS — N184 Chronic kidney disease, stage 4 (severe): Secondary | ICD-10-CM | POA: Diagnosis present

## 2015-12-15 DIAGNOSIS — Z794 Long term (current) use of insulin: Secondary | ICD-10-CM

## 2015-12-15 DIAGNOSIS — N949 Unspecified condition associated with female genital organs and menstrual cycle: Secondary | ICD-10-CM | POA: Diagnosis not present

## 2015-12-15 DIAGNOSIS — A419 Sepsis, unspecified organism: Secondary | ICD-10-CM | POA: Diagnosis not present

## 2015-12-15 DIAGNOSIS — Z6829 Body mass index (BMI) 29.0-29.9, adult: Secondary | ICD-10-CM | POA: Diagnosis not present

## 2015-12-15 DIAGNOSIS — M79604 Pain in right leg: Secondary | ICD-10-CM | POA: Diagnosis present

## 2015-12-15 DIAGNOSIS — R918 Other nonspecific abnormal finding of lung field: Secondary | ICD-10-CM

## 2015-12-15 DIAGNOSIS — I482 Chronic atrial fibrillation: Secondary | ICD-10-CM | POA: Diagnosis present

## 2015-12-15 DIAGNOSIS — E785 Hyperlipidemia, unspecified: Secondary | ICD-10-CM | POA: Diagnosis present

## 2015-12-15 DIAGNOSIS — R1010 Upper abdominal pain, unspecified: Secondary | ICD-10-CM | POA: Insufficient documentation

## 2015-12-15 DIAGNOSIS — I2510 Atherosclerotic heart disease of native coronary artery without angina pectoris: Secondary | ICD-10-CM | POA: Diagnosis present

## 2015-12-15 DIAGNOSIS — E039 Hypothyroidism, unspecified: Secondary | ICD-10-CM | POA: Diagnosis present

## 2015-12-15 DIAGNOSIS — R112 Nausea with vomiting, unspecified: Secondary | ICD-10-CM | POA: Diagnosis present

## 2015-12-15 DIAGNOSIS — A084 Viral intestinal infection, unspecified: Secondary | ICD-10-CM | POA: Diagnosis present

## 2015-12-15 DIAGNOSIS — E8770 Fluid overload, unspecified: Secondary | ICD-10-CM

## 2015-12-15 HISTORY — DX: Chronic kidney disease, stage 3 (moderate): N18.3

## 2015-12-15 HISTORY — DX: Chronic combined systolic (congestive) and diastolic (congestive) heart failure: I50.42

## 2015-12-15 HISTORY — DX: Chronic kidney disease, stage 3 unspecified: N18.30

## 2015-12-15 LAB — CBC WITH DIFFERENTIAL/PLATELET
Basophils Absolute: 0 10*3/uL (ref 0.0–0.1)
Basophils Relative: 0 %
Eosinophils Absolute: 0 10*3/uL (ref 0.0–0.7)
Eosinophils Relative: 0 %
HEMATOCRIT: 33.1 % — AB (ref 36.0–46.0)
HEMOGLOBIN: 11.2 g/dL — AB (ref 12.0–15.0)
LYMPHS ABS: 1 10*3/uL (ref 0.7–4.0)
Lymphocytes Relative: 14 %
MCH: 31.5 pg (ref 26.0–34.0)
MCHC: 33.8 g/dL (ref 30.0–36.0)
MCV: 93 fL (ref 78.0–100.0)
MONOS PCT: 9 %
Monocytes Absolute: 0.6 10*3/uL (ref 0.1–1.0)
NEUTROS ABS: 5.6 10*3/uL (ref 1.7–7.7)
NEUTROS PCT: 77 %
Platelets: 306 10*3/uL (ref 150–400)
RBC: 3.56 MIL/uL — ABNORMAL LOW (ref 3.87–5.11)
RDW: 17.2 % — ABNORMAL HIGH (ref 11.5–15.5)
WBC: 7.3 10*3/uL (ref 4.0–10.5)

## 2015-12-15 LAB — TROPONIN I: Troponin I: <0.03 ng/mL (ref <0.031)

## 2015-12-15 LAB — I-STAT CG4 LACTIC ACID, ED: Lactic Acid, Venous: 2.78 mmol/L (ref 0.5–2.0)

## 2015-12-15 LAB — PROTIME-INR
INR: 1.76 — ABNORMAL HIGH (ref 0.00–1.49)
Prothrombin Time: 19.9 seconds — ABNORMAL HIGH (ref 11.6–15.2)

## 2015-12-15 LAB — COMPREHENSIVE METABOLIC PANEL
ALBUMIN: 3 g/dL — AB (ref 3.5–5.0)
ALK PHOS: 299 U/L — AB (ref 38–126)
ALT: 75 U/L — ABNORMAL HIGH (ref 14–54)
ANION GAP: 11 (ref 5–15)
AST: 99 U/L — ABNORMAL HIGH (ref 15–41)
BUN: 28 mg/dL — ABNORMAL HIGH (ref 6–20)
CALCIUM: 8.9 mg/dL (ref 8.9–10.3)
CO2: 22 mmol/L (ref 22–32)
Chloride: 92 mmol/L — ABNORMAL LOW (ref 101–111)
Creatinine, Ser: 2.19 mg/dL — ABNORMAL HIGH (ref 0.44–1.00)
GFR calc Af Amer: 23 mL/min — ABNORMAL LOW (ref >60)
GFR calc non Af Amer: 20 mL/min — ABNORMAL LOW (ref >60)
GLUCOSE: 122 mg/dL — AB (ref 65–99)
POTASSIUM: 6.1 mmol/L — AB (ref 3.5–5.1)
SODIUM: 125 mmol/L — AB (ref 135–145)
Total Bilirubin: 1.2 mg/dL (ref 0.3–1.2)
Total Protein: 7.3 g/dL (ref 6.5–8.1)

## 2015-12-15 LAB — LACTIC ACID, PLASMA: LACTIC ACID, VENOUS: 3.9 mmol/L — AB (ref 0.5–2.0)

## 2015-12-15 LAB — APTT: APTT: 33 s (ref 24–37)

## 2015-12-15 LAB — MRSA PCR SCREENING: MRSA by PCR: NEGATIVE

## 2015-12-15 LAB — PROCALCITONIN: Procalcitonin: 0.25 ng/mL

## 2015-12-15 LAB — LIPASE, BLOOD: Lipase: 27 U/L (ref 11–51)

## 2015-12-15 MED ORDER — DEXTROSE 50 % IV SOLN
50.0000 mL | Freq: Once | INTRAVENOUS | Status: AC
  Administered 2015-12-15: 50 mL via INTRAVENOUS
  Filled 2015-12-15: qty 50

## 2015-12-15 MED ORDER — DEXTROSE 5 % IV SOLN
2.0000 g | Freq: Once | INTRAVENOUS | Status: AC
  Administered 2015-12-15: 2 g via INTRAVENOUS
  Filled 2015-12-15: qty 2

## 2015-12-15 MED ORDER — HYDROXYCHLOROQUINE SULFATE 200 MG PO TABS
200.0000 mg | ORAL_TABLET | Freq: Two times a day (BID) | ORAL | Status: DC
  Administered 2015-12-15 – 2015-12-20 (×9): 200 mg via ORAL
  Filled 2015-12-15 (×11): qty 1

## 2015-12-15 MED ORDER — SODIUM CHLORIDE 0.9 % IV BOLUS (SEPSIS)
500.0000 mL | Freq: Once | INTRAVENOUS | Status: AC
  Administered 2015-12-15: 500 mL via INTRAVENOUS

## 2015-12-15 MED ORDER — LEVOTHYROXINE SODIUM 75 MCG PO TABS
75.0000 ug | ORAL_TABLET | Freq: Every day | ORAL | Status: DC
  Administered 2015-12-17 – 2015-12-20 (×4): 75 ug via ORAL
  Filled 2015-12-15 (×4): qty 1

## 2015-12-15 MED ORDER — LEVOFLOXACIN IN D5W 750 MG/150ML IV SOLN
750.0000 mg | Freq: Once | INTRAVENOUS | Status: AC
  Administered 2015-12-15: 750 mg via INTRAVENOUS
  Filled 2015-12-15 (×2): qty 150

## 2015-12-15 MED ORDER — ATORVASTATIN CALCIUM 10 MG PO TABS
10.0000 mg | ORAL_TABLET | Freq: Every day | ORAL | Status: DC
  Administered 2015-12-16 – 2015-12-20 (×5): 10 mg via ORAL
  Filled 2015-12-15 (×5): qty 1

## 2015-12-15 MED ORDER — VANCOMYCIN HCL IN DEXTROSE 1-5 GM/200ML-% IV SOLN
1000.0000 mg | Freq: Once | INTRAVENOUS | Status: AC
  Administered 2015-12-15: 1000 mg via INTRAVENOUS
  Filled 2015-12-15: qty 200

## 2015-12-15 MED ORDER — SODIUM POLYSTYRENE SULFONATE 15 GM/60ML PO SUSP
30.0000 g | Freq: Once | ORAL | Status: AC
  Administered 2015-12-15: 30 g via ORAL
  Filled 2015-12-15: qty 120

## 2015-12-15 MED ORDER — INSULIN ASPART 100 UNIT/ML ~~LOC~~ SOLN
0.0000 [IU] | Freq: Three times a day (TID) | SUBCUTANEOUS | Status: DC
  Administered 2015-12-17 – 2015-12-18 (×2): 1 [IU] via SUBCUTANEOUS
  Administered 2015-12-19 – 2015-12-20 (×2): 2 [IU] via SUBCUTANEOUS

## 2015-12-15 MED ORDER — ALBUTEROL SULFATE (2.5 MG/3ML) 0.083% IN NEBU: 2.5000 mg | INHALATION_SOLUTION | RESPIRATORY_TRACT | Status: DC | PRN

## 2015-12-15 MED ORDER — MORPHINE SULFATE (PF) 2 MG/ML IV SOLN: 1.0000 mg | INTRAVENOUS | Status: DC | PRN

## 2015-12-15 MED ORDER — ALBUTEROL SULFATE (2.5 MG/3ML) 0.083% IN NEBU
2.5000 mg | INHALATION_SOLUTION | Freq: Once | RESPIRATORY_TRACT | Status: AC
  Administered 2015-12-15: 2.5 mg via RESPIRATORY_TRACT
  Filled 2015-12-15: qty 3

## 2015-12-15 MED ORDER — AMIODARONE HCL 200 MG PO TABS
200.0000 mg | ORAL_TABLET | Freq: Two times a day (BID) | ORAL | Status: DC
  Administered 2015-12-15 – 2015-12-17 (×4): 200 mg via ORAL
  Filled 2015-12-15 (×4): qty 1

## 2015-12-15 MED ORDER — SODIUM CHLORIDE 0.9 % IV SOLN
INTRAVENOUS | Status: DC
  Administered 2015-12-15 – 2015-12-16 (×2): via INTRAVENOUS

## 2015-12-15 MED ORDER — INSULIN ASPART 100 UNIT/ML ~~LOC~~ SOLN: 0.0000 [IU] | Freq: Every day | SUBCUTANEOUS | Status: DC

## 2015-12-15 MED ORDER — ALUM & MAG HYDROXIDE-SIMETH 200-200-20 MG/5ML PO SUSP: 30.0000 mL | Freq: Four times a day (QID) | ORAL | Status: DC | PRN

## 2015-12-15 MED ORDER — HYDROCORTISONE NA SUCCINATE PF 100 MG IJ SOLR
50.0000 mg | Freq: Four times a day (QID) | INTRAMUSCULAR | Status: AC
  Administered 2015-12-15 – 2015-12-16 (×3): 50 mg via INTRAVENOUS
  Filled 2015-12-15 (×4): qty 2

## 2015-12-15 MED ORDER — LATANOPROST 0.005 % OP SOLN
1.0000 [drp] | Freq: Every day | OPHTHALMIC | Status: DC
  Administered 2015-12-15 – 2015-12-19 (×5): 1 [drp] via OPHTHALMIC
  Filled 2015-12-15: qty 2.5

## 2015-12-15 MED ORDER — METHOTREXATE 2.5 MG PO TABS
12.5000 mg | ORAL_TABLET | ORAL | Status: DC
Start: 2015-12-17 — End: 2015-12-15

## 2015-12-15 MED ORDER — DEXTROSE 5 % IV SOLN
1.0000 g | Freq: Three times a day (TID) | INTRAVENOUS | Status: DC
  Administered 2015-12-16 (×2): 1 g via INTRAVENOUS
  Filled 2015-12-15 (×3): qty 1

## 2015-12-15 MED ORDER — APIXABAN 2.5 MG PO TABS
2.5000 mg | ORAL_TABLET | Freq: Two times a day (BID) | ORAL | Status: DC
  Administered 2015-12-15: 2.5 mg via ORAL
  Filled 2015-12-15: qty 1

## 2015-12-15 MED ORDER — SENNA 8.6 MG PO TABS: 1.0000 | ORAL_TABLET | Freq: Every evening | ORAL | Status: DC | PRN

## 2015-12-15 MED ORDER — FOLIC ACID 1 MG PO TABS
1.0000 mg | ORAL_TABLET | Freq: Every day | ORAL | Status: DC
  Administered 2015-12-15 – 2015-12-20 (×6): 1 mg via ORAL
  Filled 2015-12-15 (×6): qty 1

## 2015-12-15 MED ORDER — VANCOMYCIN HCL IN DEXTROSE 750-5 MG/150ML-% IV SOLN
750.0000 mg | INTRAVENOUS | Status: DC
  Filled 2015-12-15: qty 150

## 2015-12-15 MED ORDER — ONDANSETRON HCL 4 MG/2ML IJ SOLN: 4.0000 mg | Freq: Three times a day (TID) | INTRAMUSCULAR | Status: DC | PRN

## 2015-12-15 MED ORDER — LEVOFLOXACIN IN D5W 750 MG/150ML IV SOLN: 750.0000 mg | INTRAVENOUS | Status: DC

## 2015-12-15 MED ORDER — INSULIN ASPART 100 UNIT/ML ~~LOC~~ SOLN
5.0000 [IU] | Freq: Once | SUBCUTANEOUS | Status: AC
  Administered 2015-12-15: 5 [IU] via SUBCUTANEOUS

## 2015-12-15 MED ORDER — GUAIFENESIN-CODEINE 100-10 MG/5ML PO SOLN
10.0000 mL | Freq: Every evening | ORAL | Status: DC | PRN
  Administered 2015-12-16 – 2015-12-20 (×4): 10 mL via ORAL
  Filled 2015-12-15 (×4): qty 10

## 2015-12-15 NOTE — Progress Notes (Signed)
Methotrexate (Trexall; Rheumatrex) hold criteria  Hgb < 8  WBC < 3  Pltc < 100K  SCr > 1.5x baseline (or > 2 if baseline unknown)  AST or ALT >3x ULN  Bili > 1.5x ULN  Ascites or pleural effusion  Diarrhea - Grade 2 or higher  Ulcerative stomatitis  Unexplained pneumonitis / hypoxemia  4/5: Hold Methotrexate for: SCr > 2.0, lung infiltrate AST/ALT elevated, not yet 3x ULN  Otho Bellows PharmD Pager [phone] 12/15/2015, 9:29 PM

## 2015-12-15 NOTE — ED Notes (Signed)
Pt not feeling able to give UA. Fluids given

## 2015-12-15 NOTE — ED Notes (Signed)
Patient is from [facility] and has had nausea since Monday. Patient is currently on a fluid restriction and lasix. Patient is being treated with phenergan which helped, but daughter wanted the patient sent out anyway.

## 2015-12-15 NOTE — Progress Notes (Signed)
CRITICAL VALUE ALERT  Critical value received:  Lactic acid 3.9  Date of notification:  12/15/15  Time of notification:  2256  Critical value read back:Yes.    Nurse who received alert:  Loletta Parish RN   MD notified (1st page):  [facility] NP K. Schorr  Time of first page:  2315  MD notified (2nd page):  Time of second page:  Responding MD:  [facility] NP K. Schorr  Time MD responded:  2315

## 2015-12-15 NOTE — ED Notes (Signed)
Pt aware that a urine sample is needed. Pt given and bedside commode and informed to press call bell when ready to use it.

## 2015-12-15 NOTE — ED Notes (Signed)
Lac 2.78 RN notified

## 2015-12-15 NOTE — ED Notes (Signed)
ONLY ABLE TO ABTAIN ONE SET OF CULTURES

## 2015-12-15 NOTE — H&P (Addendum)
[facility] History and Physical  Theresa Cortez CXK:[id number] DOB: 29-Jun-1930 DOA: 12/15/2015  Referring physician: ED physician PCP: Theresa Bender, MD  Specialists:   Chief Complaint: Productive cough, nausea, vomiting and abdominal pain  HPI: Theresa Cortez is a 82 y.o. female with PMH of hypertension, hyperlipidemia, diabetes mellitus, hypothyroidism, combined systolic and diastolic congestive heart failure with EF 25-30%, atrial fibrillation on Eliquis, chronic kidney disease-stage III, rheumatoid arthritis on methotrexate,, DJD, mitral regurgitation, tricuspid regurgitation, who presents with  productive cough, nausea, vomiting and abdominal pain.  Patient was recently hospitalized from 3/13-3/21 because of decompensated CHF and newly diagnosed atrial fibrillation. She underwent right and left heart catheterization showing nonocclusive CAD and pulmonary hypertension. She was discharged to SNF for rehab on Lasix 40 mg daily. Pt reports that she has been having cough and mild shortness of breath for about one week, but no fever, chills or chest pain. She coughs up small amount of yellow colored sputum. She also has been having nausea, vomiting and abdominal pain in the past 3 days. She vomited once today without blood in the vomitus. No diarrhea. She has mild abdominal pain over RMQ. Her abdominal pain is intermittent, nonradiating. Patient does not have symptoms of UTI, unilateral weakness. She has mild leg edema, no tenderness over calf areas.  In ED, patient was found to have hypotension with blood pressure 83/55, WBC 7.3, temperature normal, no tachycardia, lactic 2.78, INR 1.76, negative troponin, lipase 27, potassium 6.1 without T-wave peaking on EKG, sodium 125, worsening renal function, abnormal liver function with AST 97, ALT 75 and total bilirubin 1.2. Chest x-ray showed possible right upper lobe infiltration. Patient is admitted to inpatient for further evaluation and treatment. PCCM  was consulted by EDP.  EKG: Independently reviewed.  QTC 489, old left bundle blockage, anteroseptal infarction pattern, low voltage, no T-wave peaking.  Where does patient live? SNF Can patient participate in ADLs? None  Review of Systems:   General: no fevers, chills, no changes in body weight, has poor appetite, has fatigue HEENT: no blurry vision, hearing changes or sore throat Pulm: has mild dyspnea, coughing, no wheezing CV: no chest pain, no palpitations Abd: has nausea, vomiting, abdominal pain, no diarrhea, constipation GU: no dysuria, burning on urination, increased urinary frequency, hematuria  Ext: has mild leg edema Neuro: no unilateral weakness, numbness, or tingling, no vision change or hearing loss Skin: no rash MSK: No muscle spasm, no deformity, no limitation of range of movement in spin Heme: No easy bruising.  Travel history: No recent long distant travel.  Allergy:  Allergies  Allergen Reactions  . Penicillins Swelling    Past Medical History  Diagnosis Date  . Hypertension   . Wound infection (HCC)     Right knee   . Hypothyroidism   . History of diverticulosis   . Hyperlipidemia   . Type II diabetes mellitus (HCC)   . History of blood transfusion X 2    w/knee replacement  . Stroke [facility]) 2000    denies residual on 11/23/2015  . Rheumatoid arthritis(714.0)   . DJD (degenerative joint disease)   . Arthritis     "hands, arms" (11/23/2015)  . CKD (chronic kidney disease), stage III   . Chronic combined systolic and diastolic heart failure (HCC)   . Atrial fibrillation [facility])     Past Surgical History  Procedure Laterality Date  . Knee arthroscopy Right   . Total knee arthroplasty Right 08/07/2011; 2014  . Partial colectomy  descending colon by Dr. Orson Slick  . Appendectomy    . Medial partial knee replacement Right   . Incision and drainage mouth Right 2014    "knee; took replacement out; put  spacers in"  . Tubal ligation    . Cataract  extraction w/ intraocular lens implant Right   . Cardiac catheterization N/A 11/25/2015    Procedure: Right/Left Heart Cath and Coronary Angiography;  Surgeon: Iran Ouch, MD;  Location: [facility] INVASIVE CV LAB;  Service: Cardiovascular;  Laterality: N/A;    Social History:  reports that she has never smoked. She has quit using smokeless tobacco. Her smokeless tobacco use included Snuff. She reports that she does not drink alcohol or use illicit drugs.  Family History:  Family History  Problem Relation Age of Onset  . Hypertension Mother      Prior to Admission medications   Medication Sig Start Date End Date Taking? Authorizing Provider  amiodarone (PACERONE) 200 MG tablet Take 1 tablet (200 mg total) by mouth 2 (two) times daily. 11/30/15  Yes Leroy Sea, MD  apixaban (ELIQUIS) 2.5 MG TABS tablet Take 1 tablet (2.5 mg total) by mouth 2 (two) times daily. 11/30/15  Yes Leroy Sea, MD  atorvastatin (LIPITOR) 10 MG tablet Take 10 mg by mouth daily.    Yes Historical Provider, MD  diclofenac sodium (VOLTAREN) 1 % GEL Apply 4 g topically 4 (four) times daily.   Yes Historical Provider, MD  folic acid (FOLVITE) 1 MG tablet Take 1 mg by mouth daily.   Yes Historical Provider, MD  furosemide (LASIX) 40 MG tablet Take 1 tablet (40 mg total) by mouth every other day. 12/06/15  Yes Mihai Croitoru, MD  guaiFENesin-codeine (ROBITUSSIN AC) 100-10 MG/5ML syrup Take 10 mLs by mouth at bedtime as needed for cough. 12/06/15  Yes Mihai Croitoru, MD  hydroxychloroquine (PLAQUENIL) 200 MG tablet Take 200 mg by mouth 2 (two) times daily.   Yes Historical Provider, MD  latanoprost (XALATAN) 0.005 % ophthalmic solution Place 1 drop into both eyes at bedtime.   Yes Historical Provider, MD  levothyroxine (SYNTHROID, LEVOTHROID) 75 MCG tablet Take 75 mcg by mouth daily before breakfast.   Yes Historical Provider, MD  methotrexate (RHEUMATREX) 2.5 MG tablet Take 12.5 mg by mouth every Friday.  Caution:Chemotherapy. Protect from light. Take 5 tabs by mouth bid every Friday.   Yes Historical Provider, MD  senna (SENOKOT) 8.6 MG tablet Take 1 tablet by mouth 2 (two) times daily.   Yes Historical Provider, MD  sitaGLIPtin (JANUVIA) 50 MG tablet Take 50 mg by mouth daily.   Yes Historical Provider, MD  acetaminophen (TYLENOL) 325 MG tablet Take 650 mg by mouth every 4 (four) hours as needed for moderate pain.    Historical Provider, MD  alum & mag hydroxide-simeth (MAALOX/MYLANTA) 200-200-20 MG/5ML suspension Take 30 mLs by mouth every 6 (six) hours as needed for indigestion.    Historical Provider, MD  benzonatate (TESSALON) 200 MG capsule Take 200 mg by mouth every 8 (eight) hours as needed for cough.    Historical Provider, MD  docusate sodium (COLACE) 100 MG capsule Take 2 capsules (200 mg total) by mouth 2 (two) times daily. Patient not taking: Reported on 12/15/2015 11/30/15   Leroy Sea, MD  guaifenesin (ROBITUSSIN) 100 MG/5ML syrup Take 200 mg by mouth at bedtime as needed for cough.    Historical Provider, MD  insulin aspart (NOVOLOG) 100 UNIT/ML injection Before each meal 3 times a day, 140-199 -  2 units, 200-250 - 4 units, 251-299 - 6 units,  300-349 - 8 units,  350 or above 10 units. Dispense syringes and needles as needed, Ok to switch to PEN if approved. Substitute to any brand approved. DX DM2, Code E11.65 11/30/15   Leroy Sea, MD  levofloxacin (LEVAQUIN) 500 MG tablet Take 1 tablet (500 mg total) by mouth daily. For 7 days. Patient not taking: Reported on 12/15/2015 12/06/15   Rachelle Hora Croitoru, MD  sodium phosphate (FLEET) enema Place 1 enema rectally once. follow package directions    Historical Provider, MD    Physical Exam: Filed Vitals:   12/15/15 1534 12/15/15 1552 12/15/15 1910 12/15/15 2031  BP: 86/61 83/54 83/55  81/53  Pulse: 71  70 71  Temp: 97 F (36.1 C)  97.8 F (36.6 C)   TempSrc: Oral  Oral   Resp: 18 16 18 24   SpO2: 100%  100% 100%   General: Not in  acute distress HEENT:       Eyes: PERRL, EOMI, no scleral icterus.       ENT: No discharge from the ears and nose, no pharynx injection, no tonsillar enlargement.        Neck: No JVD, no bruit, no mass felt. Heme: No neck lymph node enlargement. Cardiac: S1/S2, RRR, No murmurs, No gallops or rubs. Pulm: No rales, wheezing, rhonchi or rubs. Abd: Soft, nondistended, mild tenderness over RMQ, no rebound pain, no organomegaly, BS present. Ext: trace pitting leg edema bilaterally. 2+DP/PT pulse bilaterally. Musculoskeletal: No joint deformities, No joint redness or warmth, no limitation of ROM in spin. Skin: No rashes.  Neuro: Alert, oriented X3, cranial nerves II-XII grossly intact, moves all extremities normally. Psych: Patient is not psychotic, no suicidal or hemocidal ideation.  Labs on Admission:  Basic Metabolic Panel:  Recent Labs Lab 12/12/15 2116 12/15/15 1632  NA 124* 125*  K 5.3* 6.1*  CL 89* 92*  CO2 25 22  GLUCOSE 145* 122*  BUN 15 28*  CREATININE 1.71* 2.19*  CALCIUM 8.8* 8.9   Liver Function Tests:  Recent Labs Lab 12/12/15 2116 12/15/15 1632  AST 35 99*  ALT 34 75*  ALKPHOS 110 299*  BILITOT 1.0 1.2  PROT 6.7 7.3  ALBUMIN 2.6* 3.0*    Recent Labs Lab 12/15/15 1644  LIPASE 27   No results for input(s): AMMONIA in the last 168 hours. CBC:  Recent Labs Lab 12/12/15 2116 12/15/15 1632  WBC 7.2 7.3  NEUTROABS 5.1 5.6  HGB 10.2* 11.2*  HCT 30.5* 33.1*  MCV 95.0 93.0  PLT 310 306   Cardiac Enzymes:  Recent Labs Lab 12/15/15 1632  TROPONINI <0.03    BNP (last 3 results)  Recent Labs  11/22/15 2052  BNP 1732.2*    ProBNP (last 3 results) No results for input(s): PROBNP in the last 8760 hours.  CBG: No results for input(s): GLUCAP in the last 168 hours.  Radiological Exams on Admission: Dg Chest 2 View  12/15/2015  CLINICAL DATA:  Weakness and shortness of Breath EXAM: CHEST  2 VIEW COMPARISON:  12/12/2015 FINDINGS: Cardiac  shadow remains enlarged. The lungs are again well aerated. Some increasing density is noted in the right upper lobe which may represent some early infiltrate. Chronic interstitial changes are noted. No sizable effusion is seen. No acute bony abnormality is noted. IMPRESSION: Increased density in the right upper lobe which may represent acute on chronic infiltrate. Electronically Signed   By: 02/14/2016 M.D.   On: 12/15/2015  17:08    Assessment/Plan Principal Problem:   HCAP (healthcare-associated pneumonia) Active Problems:   Hypertension   Leg pain, right   Chronic combined systolic and diastolic heart failure (HCC)   Tricuspid regurgitation   Pulmonary hypertension (HCC)   Diabetes mellitus, type 2 (HCC)   Acute renal failure superimposed on stage 3 chronic kidney disease (HCC)   HLD (hyperlipidemia)   Hypothyroidism   Sepsis (HCC)   RA (rheumatoid arthritis) (HCC)   Hyperkalemia   Hyponatremia   Chronic atrial fibrillation (HCC)   Hypotension   Possible HCAP and sepsis: Patient's productive cough, mild shortness of breath and chest x-ray findings are consistent with possible HCAP. She has sepsis with elevated lactate of 2.8 and hypotension on admission. Pt's Bp has been low recently. The mental status is normal. Patient does not have leukocytosis and fever, which may be due to immunosuppressant use. She is taking methotrexate for RA. PCCM was consulted.  - will admit to SDU - f/u PCCM recommendations - IV Vancomycin, Aztreonam and levaquine per pharmacy - Robitussin for cough  - Albuterol Neb prn for SOB - Urine legionella and S. pneumococcal antigen - Follow up blood culture x2, sputum culture and respiratory virus panel, plus Flu pcr - will get Procalcitonin and trend lactic acid level per sepsis protocol - IVF: 500 mL x 2 of NS bolus in ED, followed by 75 mL per hour of NS (patient has EF 25-30%, limiting aggressive IV fluids treatment) -check cortisol level  Nausea,  vomiting and abdominal pain: Etiology is not clear. Lipase is normal. - Zofran for nausea and morphine for pain - CT-abd/pelvis without contrast  Addendum: CT abdomen showed possible acute cholecystitis. Dr. Hazle Quant recommend to consult general surgeon.  -Dr. Michaell Cowing is consulted.  -will d/c her Eliquis now.  HTN: -hold lasix due to hypotension  DM-II: Last A1c 7.3, fairly controled. Patient is taking Januvia and NovoLog at home -SSI  HLD: Last LDL was not on record -Continue home medications: lipitor -Check FLP  AoCKD-III: Baseline Cre is 1.3-1.7, her Cre is 2.19 on admission. Likely due to prerenal secondary to dehydration and continuation of diruetics, NSAIDs. - IVF as above - Check FeUrea - Follow up renal function by BMP - Hold lasix and Voltaren gel  Hypothyroidism: Last TSH was 1.242 on 11/23/15 -Continue home Synthroid  RA (rheumatoid arthritis) (HCC): -continue Plaquenil and methotrexate  Atrial Fibrillation: CHA2DS2-VASc Score is 5, needs oral anticoagulation. Patient is Eliquis at home. Heart rate is well controlled. -continue Eliquis and amiodarone  Hyperkalemia: K=6.1 without T wave change on EKG. Likely due to worsening renal function -IV fluids as above -Kayexalate 30 g 1 -given Novology 5 units plus D50 50 ml  Chronic combined systolic and diastolic congestive heart failure: 2D echo on 11/24/15 showed EF 25-30 percent. She has trace leg edema. CHF is compensated. -Hold Lasix due to worsening renal function and sepsis -Check BNP  Addendum: RN reports that pt had one episode of sudden jerking motion, and became unresponsive, which lasted for about 1 min. Per patient's daughter, patient had a similar episode in previous admission without clear diagnosis. Etiology is not clear, but seizure is a potential differential diagnosis. -Seizure precaution -When necessary Ativan for seizure -MRI for brain -EEG -May consult to neurology in morning   DVT ppx: on  Eliquis-->d/c'ed Eliquis due to possible procedure-->start SCD  Code Status: Full code Family Communication:  Yes, patient's 2 daughters  at bed side Disposition Plan: Admit to inpatient   Date  of Service 12/15/2015    Lorretta Harp [facility] Pager [phone]  If 7PM-7AM, please contact night-coverage [email or web address] Password [facility] 12/15/2015, 8:37 PM

## 2015-12-15 NOTE — Consult Note (Signed)
PULMONARY / CRITICAL CARE MEDICINE   Name: Theresa Cortez MRN: [id number] DOB: 01-13-1930    ADMISSION DATE:  12/15/2015 CONSULTATION DATE:  12/15/2015  REFERRING MD:  Dr. Clyde Lundborg with [facility]  CHIEF COMPLAINT:  Right leg pain, nausea and vomiting  HISTORY OF PRESENT ILLNESS:   82 year old female with PMH as below, which includes HTN, DM2, CVA (2000), RA on methotrexate, CKD III, and combined CHF with LVEF 25-30%. She was recently admitted for CHF exacerbation and was newly diagnosed Atrial fibrillation at that time. She was effectively diuresed to a 19 pound weight loss. She was started on amiodarone and apixaban, and additionaly was discharged on lasix 40mg . She was discharged to [facility] 11/30/15. Since arrival at SNF has complained of cough and SOB. Cough productive for minimal yellow sputum. Denied fever/chills. She was seen by her cardiologist on 12/06/2015 and his impression and that time was that she was over diuresed. He adjusted the Lasix to every other day but unfortunately her daughter reports that the nursing home continue to administer it on a daily basis.  The patient states that she's had a right sided flank pain to right lower quadrant pain for the last 3 weeks. She says that the pain comes and goes, she does not know of any sort of relieving factors but she says it's worse when she lies flat. She does also note about 3 days of increasing nausea and vomiting. She has not been able to keep much down for the last several days.  Currently, though she has cough with mucus production she denies shortness of breath.  Pulmonary and critical care medicine was consulted for further evaluation of hypotension, acute kidney injury, and a slightly elevated lactic acid.   PAST MEDICAL HISTORY :  She  has a past medical history of Hypertension; Wound infection (HCC); Hypothyroidism; History of diverticulosis; Hyperlipidemia; Type II diabetes mellitus (HCC); History of blood transfusion (X 2);  Stroke (HCC) (2000); Rheumatoid arthritis(714.0); DJD (degenerative joint disease); Arthritis; CKD (chronic kidney disease), stage III; Chronic combined systolic and diastolic heart failure (HCC); and Atrial fibrillation (HCC).  PAST SURGICAL HISTORY: She  has past surgical history that includes Knee arthroscopy (Right); Total knee arthroplasty (Right, 08/07/2011; 2014); Partial colectomy; Appendectomy; Medial partial knee replacement (Right); Incision and drainage mouth (Right, 2014); Tubal ligation; Cataract extraction w/ intraocular lens implant (Right); and Cardiac catheterization (N/A, 11/25/2015).  Allergies  Allergen Reactions  . Penicillins Swelling    No current facility-administered medications on file prior to encounter.   Current Outpatient Prescriptions on File Prior to Encounter  Medication Sig  . amiodarone (PACERONE) 200 MG tablet Take 1 tablet (200 mg total) by mouth 2 (two) times daily.  11/27/2015 apixaban (ELIQUIS) 2.5 MG TABS tablet Take 1 tablet (2.5 mg total) by mouth 2 (two) times daily.  Marland Kitchen atorvastatin (LIPITOR) 10 MG tablet Take 10 mg by mouth daily.   . diclofenac sodium (VOLTAREN) 1 % GEL Apply 4 g topically 4 (four) times daily.  . folic acid (FOLVITE) 1 MG tablet Take 1 mg by mouth daily.  . furosemide (LASIX) 40 MG tablet Take 1 tablet (40 mg total) by mouth every other day.  Marland Kitchen guaiFENesin-codeine (ROBITUSSIN AC) 100-10 MG/5ML syrup Take 10 mLs by mouth at bedtime as needed for cough.  . hydroxychloroquine (PLAQUENIL) 200 MG tablet Take 200 mg by mouth 2 (two) times daily.  Marland Kitchen latanoprost (XALATAN) 0.005 % ophthalmic solution Place 1 drop into both eyes at bedtime.  Marland Kitchen levothyroxine (SYNTHROID,  LEVOTHROID) 75 MCG tablet Take 75 mcg by mouth daily before breakfast.  . methotrexate (RHEUMATREX) 2.5 MG tablet Take 12.5 mg by mouth every Friday. Caution:Chemotherapy. Protect from light. Take 5 tabs by mouth bid every Friday.  . sitaGLIPtin (JANUVIA) 50 MG tablet Take 50 mg by  mouth daily.  Marland Kitchen acetaminophen (TYLENOL) 325 MG tablet Take 650 mg by mouth every 4 (four) hours as needed for moderate pain.  Marland Kitchen alum & mag hydroxide-simeth (MAALOX/MYLANTA) 200-200-20 MG/5ML suspension Take 30 mLs by mouth every 6 (six) hours as needed for indigestion.  . benzonatate (TESSALON) 200 MG capsule Take 200 mg by mouth every 8 (eight) hours as needed for cough.  . docusate sodium (COLACE) 100 MG capsule Take 2 capsules (200 mg total) by mouth 2 (two) times daily. (Patient not taking: Reported on 12/15/2015)  . guaifenesin (ROBITUSSIN) 100 MG/5ML syrup Take 200 mg by mouth at bedtime as needed for cough.  . insulin aspart (NOVOLOG) 100 UNIT/ML injection Before each meal 3 times a day, 140-199 - 2 units, 200-250 - 4 units, 251-299 - 6 units,  300-349 - 8 units,  350 or above 10 units. Dispense syringes and needles as needed, Ok to switch to PEN if approved. Substitute to any brand approved. DX DM2, Code E11.65  . levofloxacin (LEVAQUIN) 500 MG tablet Take 1 tablet (500 mg total) by mouth daily. For 7 days. (Patient not taking: Reported on 12/15/2015)  . sodium phosphate (FLEET) enema Place 1 enema rectally once. follow package directions    FAMILY HISTORY:  Her has no family status information on file.   SOCIAL HISTORY: She  reports that she has never smoked. She has quit using smokeless tobacco. Her smokeless tobacco use included Snuff. She reports that she does not drink alcohol or use illicit drugs.  REVIEW OF SYSTEMS:   Gen: Denies fever, chills, weight change, + fatigue, night sweats HEENT: Denies blurred vision, double vision, hearing loss, tinnitus, sinus congestion, rhinorrhea, sore throat, neck stiffness, dysphagia PULM: per HPI CV: Denies chest pain, edema, orthopnea, paroxysmal nocturnal dyspnea, palpitations GI: per HPI GU: Denies dysuria, hematuria, polyuria, oliguria, urethral discharge Endocrine: Denies hot or cold intolerance, polyuria, polyphagia or appetite  change Derm: Denies rash, dry skin, scaling or peeling skin change Heme: Denies easy bruising, bleeding, bleeding gums Neuro: Denies headache, numbness, weakness, slurred speech, loss of memory or consciousness   SUBJECTIVE:  As above  VITAL SIGNS: BP 81/53 mmHg  Pulse 71  Temp(Src) 97.8 F (36.6 C) (Oral)  Resp 24  SpO2 100%  HEMODYNAMICS:    VENTILATOR SETTINGS:    INTAKE / OUTPUT:    PHYSICAL EXAMINATION: General:  Chronically ill appearing but comfortable in bed Neuro:  Awake alert, oriented 4 HEENT:  Normocephalic atraumatic, oropharynx clear Cardiovascular:  Irregularly irregular, systolic murmur right upper sternal border most prominent, there is a second systolic murmur which is about 2 out of 6 in the left lower sternal border, JVD is not elevated with the exception of deep breaths. Lungs:  Few crackles right upper lobe, none bases, normal effort Abdomen:  Bowel sounds are positive, there are no masses, there is very mild tenderness in the right lower quadrant but no guarding or rebound tenderness Musculoskeletal:  Normal bulk and tone Skin:  No pretibial edema on my exam, extremities are warm and well perfused, no cyanosis Pulse, peripheral pulses are intact  LABS:  BMET  Recent Labs Lab 12/12/15 2116 12/15/15 1632  NA 124* 125*  K 5.3* 6.1*  CL 89* 92*  CO2 25 22  BUN 15 28*  CREATININE 1.71* 2.19*  GLUCOSE 145* 122*    Electrolytes  Recent Labs Lab 12/12/15 2116 12/15/15 1632  CALCIUM 8.8* 8.9    CBC  Recent Labs Lab 12/12/15 2116 12/15/15 1632  WBC 7.2 7.3  HGB 10.2* 11.2*  HCT 30.5* 33.1*  PLT 310 306    Coag's  Recent Labs Lab 12/15/15 1644  INR 1.76*    Sepsis Markers  Recent Labs Lab 12/15/15 1659  LATICACIDVEN 2.78*    ABG No results for input(s): PHART, PCO2ART, PO2ART in the last 168 hours.  Liver Enzymes  Recent Labs Lab 12/12/15 2116 12/15/15 1632  AST 35 99*  ALT 34 75*  ALKPHOS 110 299*   BILITOT 1.0 1.2  ALBUMIN 2.6* 3.0*    Cardiac Enzymes  Recent Labs Lab 12/15/15 1632  TROPONINI <0.03    Glucose No results for input(s): GLUCAP in the last 168 hours.  Imaging Dg Chest 2 View  12/15/2015  CLINICAL DATA:  Weakness and shortness of Breath EXAM: CHEST  2 VIEW COMPARISON:  12/12/2015 FINDINGS: Cardiac shadow remains enlarged. The lungs are again well aerated. Some increasing density is noted in the right upper lobe which may represent some early infiltrate. Chronic interstitial changes are noted. No sizable effusion is seen. No acute bony abnormality is noted. IMPRESSION: Increased density in the right upper lobe which may represent acute on chronic infiltrate. Electronically Signed   By: Alcide Clever M.D.   On: 12/15/2015 17:08     STUDIES:  12/15/2015 CT abdomen pelvis:  CULTURES: 12/15/2015 blood cultures 12/15/2015 urine cultures  ANTIBIOTICS: 12/15/2015 vancomycin 12/15/2015 aztreonam 12/15/2015 Levaquin  SIGNIFICANT EVENTS: 12/15/2015 admitted  LINES/TUBES: None  DISCUSSION: This is a pleasant 82 year old female who has rheumatoid arthritis and newly diagnosed biventricular failure with severe tricuspid regurgitation as well as severe mitral regurgitation who is being admitted to [facility] hospital on 12/15/2015 in the setting of nausea vomiting, poor by mouth intake, and ongoing diuretic use. Objectively, she has acute on chronic kidney failure, hyponatremia, chronic-appearing hypotension, and a mildly elevated lactic acidosis. She is complaining of symptoms which could be consistent with a viral gastroenteritis, though her right flank pain is somewhat unexplained. The differential diagnosis includes biliary pathology, less likely bowel ischemia, or possibly a retroperitoneal bleed given the fact that she's been treated with anticoagulation for her atrial fibrillation. At this time, she does not appear to be in septic shock as her blood pressure is  actually at her most recent baseline (blood pressure and her cardiology office visit on March 27 was 82/50), and I think her symptoms are most explained by overdiuresis as well as ongoing volume losses from nausea and vomiting likely from a viral gastroenteritis. I think that we would actually harm her if we treated her with aggressive fluid boluses or inotrope medications, at this time I think the best approach is gentle IV fluids and holding diuretic medicines. There may also be a component of adrenal insufficiency considering the adrenal hyperplasia seen on her CT, likely chronic prednisone use, and hyponatremia with hyperkalemia.  ASSESSMENT / PLAN:  PULMONARY A: Bronchiectasis Right upper lobe infiltrate, doubt healthcare associated pneumonia, question related to severe tricuspid regurgitation P:   Titrate O2 as needed for O2 saturation greater than 90%  CARDIOVASCULAR A:  Hypotension, currently at her baseline Mild lactic acidosis, multifactorial in setting of hypotension and renal failure Atrial fibrillation Congestive heart failure, systolic with LVEF 25% Secondary  pulmonary hypertension in setting of severe mitral regurgitation, right heart catheterization March 2017 with no capillary wedge pressure of 28 Severe tricuspid regurgitation Severe mitral regurgitation P:  Plan gentle IV fluids Repeat lactic acid If shock worsens then consider central access and inotropes, but at this time I think they would be more detrimental than helpful Hold diuretic medicines Telemetry monitoring  RENAL A:   Acute on chronic kidney failure secondary to overdiuresis and following losses from nausea and vomiting Hyponatremia secondary to volume loss from overdiuresis  P:   Gentle IV fluids Monitor BMET and UOP Replace electrolytes as needed Monitor potassium   GASTROINTESTINAL A:   Nausea and vomiting: Likely viral gastroenteritis Right upper and lower quadrant pain, unexplained, see  discussion above P:   Agree agree with CT abdomen Monitor lactic acidosis Would start clears in a.m.  HEMATOLOGIC A:   No acute issues P:  Monitor for bleeding  INFECTIOUS A:   Question sepsis, doubt Question age,, doubt P:   Agree with antibiotics overnight, but if picture has significantly improved by tomorrow morning would stop and monitor for signs of infection  ENDOCRINE A:   Likely adrenal insufficiency   P:   Check serum cortisol Start hydrocortisone IV 50mg  every 6 hours after cortisol level drawn  NEUROLOGIC A:   No acute issues P:      FAMILY  - Updates: Both daughters are updated at bedside at length    , MD [place] [facility] Pager: [phone] Cell: [phone] After 3pm or if no response, call [phone]   12/15/2015, 8:35 PM

## 2015-12-15 NOTE — Progress Notes (Signed)
Pharmacy Antibiotic Note  Theresa Cortez is a 82 y.o. female recently hospitalized with CHF with complications of pulmonary hypertension and severe MR. She presented to [facility] ED with productive cough, n/v, hypotension, and abdominal pain. CXR shows possible RUL infiltrate. She is admitted on 12/15/2015 with possible HCAP and severe sepsis. Pharmacy has been consulted for Vancomycin, Aztreonam, and Levaquin dosing.  Plan: Vancomycin 1g IV x 1, then 750mg  q24h. Goal trough 15-20 mcg/mL. Plan for Vancomycin trough level at steady state. Aztreonam 2g IV x 1, then 1g IV q8h. Levaquin 750mg  IV q48h. Monitor renal function, cultures, clinical course, and for de-escalation of therapy as appropriate.     Temp (24hrs), Avg:97.4 F (36.3 C), Min:97 F (36.1 C), Max:97.8 F (36.6 C)   Recent Labs Lab 12/12/15 2116 12/15/15 1632 12/15/15 1659  WBC 7.2 7.3  --   CREATININE 1.71* 2.19*  --   LATICACIDVEN  --   --  2.78*    Estimated Creatinine Clearance: 20.2 mL/min (by C-G formula based on Cr of 2.19).    Allergies  Allergen Reactions  . Penicillins Swelling    Antimicrobials this admission: 4/5 >> Vancomycin >> 4/5 >> Aztreonam >> 4/5 >> Levaquin >>  Dose adjustments this admission: --  Microbiology results: 4/5 BCx: sent 4/5 UCx: ordered  4/5 Sputum Cx: ordered 4/5 Strep pneumoniae urinary antigen: ordered 4/5 Legionella urinary antigen: ordered 4/5 Influenza PCR: ordered 4/5 respiratory virus panel: ordered  Thank you for allowing pharmacy to be a part of this patient's care.   6/5, PharmD, BCPS Pager: [phone] 12/15/2015 8:01 PM

## 2015-12-15 NOTE — ED Provider Notes (Signed)
CSN: [id number]     Arrival date & time 12/15/15  1456 History   First MD Initiated Contact with Patient 12/15/15 1553     Chief Complaint  Patient presents with  . Nausea  . Hypotension     (Consider location/radiation/quality/duration/timing/severity/associated sxs/prior Treatment) HPI A was last hospitalized approximately 2-1/2 weeks ago. She was discharged 3\21. That time patient had congestive heart failure with complications of pulmonary hypertension and mitral valve regurgitation. Patient has been undergoing rehabilitation. She developed nausea and vomiting over the past 2 days. She has had poor by mouth intake. Patient reports she feels generally ill but does not localize pain. She reports she feels very weak. One episode of vomiting today. No diarrhea. Patient was noted to be very hypotensive today. Past Medical History  Diagnosis Date  . Hypertension   . Wound infection (HCC)     Right knee   . Hypothyroidism   . History of diverticulosis   . Hyperlipidemia   . Type II diabetes mellitus (HCC)   . History of blood transfusion X 2    w/knee replacement  . Stroke [facility]) 2000    denies residual on 11/23/2015  . Rheumatoid arthritis(714.0)   . DJD (degenerative joint disease)   . Arthritis     "hands, arms" (11/23/2015)  . CKD (chronic kidney disease), stage III   . Chronic combined systolic and diastolic heart failure (HCC)   . Atrial fibrillation [facility])    Past Surgical History  Procedure Laterality Date  . Knee arthroscopy Right   . Total knee arthroplasty Right 08/07/2011; 2014  . Partial colectomy      descending colon by Dr. Orson Slick  . Appendectomy    . Medial partial knee replacement Right   . Incision and drainage mouth Right 2014    "knee; took replacement out; put  spacers in"  . Tubal ligation    . Cataract extraction w/ intraocular lens implant Right   . Cardiac catheterization N/A 11/25/2015    Procedure: Right/Left Heart Cath and Coronary Angiography;   Surgeon: Iran Ouch, MD;  Location: [facility] INVASIVE CV LAB;  Service: Cardiovascular;  Laterality: N/A;   Family History  Problem Relation Age of Onset  . Hypertension Mother    Social History  Substance Use Topics  . Smoking status: Never Smoker   . Smokeless tobacco: Former Neurosurgeon    Types: Snuff  . Alcohol Use: No   OB History    No data available     Review of Systems  10 Systems reviewed and are negative for acute change except as noted in the HPI.   Allergies  Penicillins  Home Medications   Prior to Admission medications   Medication Sig Start Date End Date Taking? Authorizing Provider  amiodarone (PACERONE) 200 MG tablet Take 1 tablet (200 mg total) by mouth 2 (two) times daily. 11/30/15  Yes Leroy Sea, MD  apixaban (ELIQUIS) 2.5 MG TABS tablet Take 1 tablet (2.5 mg total) by mouth 2 (two) times daily. 11/30/15  Yes Leroy Sea, MD  atorvastatin (LIPITOR) 10 MG tablet Take 10 mg by mouth daily.    Yes Historical Provider, MD  diclofenac sodium (VOLTAREN) 1 % GEL Apply 4 g topically 4 (four) times daily.   Yes Historical Provider, MD  folic acid (FOLVITE) 1 MG tablet Take 1 mg by mouth daily.   Yes Historical Provider, MD  furosemide (LASIX) 40 MG tablet Take 1 tablet (40 mg total) by mouth every other day. 12/06/15  Yes Mihai Croitoru, MD  guaiFENesin-codeine (ROBITUSSIN AC) 100-10 MG/5ML syrup Take 10 mLs by mouth at bedtime as needed for cough. 12/06/15  Yes Mihai Croitoru, MD  hydroxychloroquine (PLAQUENIL) 200 MG tablet Take 200 mg by mouth 2 (two) times daily.   Yes Historical Provider, MD  latanoprost (XALATAN) 0.005 % ophthalmic solution Place 1 drop into both eyes at bedtime.   Yes Historical Provider, MD  levothyroxine (SYNTHROID, LEVOTHROID) 75 MCG tablet Take 75 mcg by mouth daily before breakfast.   Yes Historical Provider, MD  methotrexate (RHEUMATREX) 2.5 MG tablet Take 12.5 mg by mouth every Friday. Caution:Chemotherapy. Protect from light. Take 5  tabs by mouth bid every Friday.   Yes Historical Provider, MD  senna (SENOKOT) 8.6 MG tablet Take 1 tablet by mouth 2 (two) times daily.   Yes Historical Provider, MD  sitaGLIPtin (JANUVIA) 50 MG tablet Take 50 mg by mouth daily.   Yes Historical Provider, MD  acetaminophen (TYLENOL) 325 MG tablet Take 650 mg by mouth every 4 (four) hours as needed for moderate pain.    Historical Provider, MD  alum & mag hydroxide-simeth (MAALOX/MYLANTA) 200-200-20 MG/5ML suspension Take 30 mLs by mouth every 6 (six) hours as needed for indigestion.    Historical Provider, MD  benzonatate (TESSALON) 200 MG capsule Take 200 mg by mouth every 8 (eight) hours as needed for cough.    Historical Provider, MD  docusate sodium (COLACE) 100 MG capsule Take 2 capsules (200 mg total) by mouth 2 (two) times daily. Patient not taking: Reported on 12/15/2015 11/30/15   Leroy Sea, MD  guaifenesin (ROBITUSSIN) 100 MG/5ML syrup Take 200 mg by mouth at bedtime as needed for cough.    Historical Provider, MD  insulin aspart (NOVOLOG) 100 UNIT/ML injection Before each meal 3 times a day, 140-199 - 2 units, 200-250 - 4 units, 251-299 - 6 units,  300-349 - 8 units,  350 or above 10 units. Dispense syringes and needles as needed, Ok to switch to PEN if approved. Substitute to any brand approved. DX DM2, Code E11.65 11/30/15   Leroy Sea, MD  levofloxacin (LEVAQUIN) 500 MG tablet Take 1 tablet (500 mg total) by mouth daily. For 7 days. Patient not taking: Reported on 12/15/2015 12/06/15   Rachelle Hora Croitoru, MD  sodium phosphate (FLEET) enema Place 1 enema rectally once. follow package directions    Historical Provider, MD   BP 93/42 mmHg  Pulse 71  Temp(Src) 97.4 F (36.3 C) (Oral)  Resp 20  Ht 5\' 6"  (1.676 m)  Wt 182 lb 15.7 oz (83 kg)  BMI 29.55 kg/m2  SpO2 100% Physical Exam  Constitutional: She is oriented to person, place, and time.  Patient is very deconditioned. She does not have respiratory distress at rest.  HENT:   Head: Normocephalic and atraumatic.  Mouth/Throat: Oropharynx is clear and moist.  Eyes: EOM are normal. Pupils are equal, round, and reactive to light.  Neck: Neck supple.  Cardiovascular: Normal rate.   2/6 systolic ejection murmur. Distal pulses are 1+.  Pulmonary/Chest: Effort normal. She has rales.  Decreased breath sounds at the bases with some fine rales. Decreased breath sounds at the bases with some fine rales  Abdominal: Soft. Bowel sounds are normal. She exhibits no distension. There is no tenderness.  Musculoskeletal: Normal range of motion. She exhibits edema.  Trace pitting edema bilateral lower extremities.  Neurological: She is alert and oriented to person, place, and time. She has normal strength. Coordination normal. GCS eye  subscore is 4. GCS verbal subscore is 5. GCS motor subscore is 6.  Skin: Skin is warm, dry and intact.  Psychiatric: She has a normal mood and affect.    ED Course  Procedures (including critical care time) Angiocath insertion Performed by: Arby Barrette  Consent: Verbal consent obtained. Risks and benefits: risks, benefits and alternatives were discussed Time out: Immediately prior to procedure a "time out" was called to verify the correct patient, procedure, equipment, support staff and site/side marked as required.  Preparation: Patient was prepped and draped in the usual sterile fashion.  Vein Location: right basilic  Ultrasound Guided  Gauge: 22  Normal blood return and flush without difficulty Patient tolerance: Patient tolerated the procedure well with no immediate complications.    CRITICAL CARE Performed by: Arby Barrette   Total critical care time: 45 minutes  Critical care time was exclusive of separately billable procedures and treating other patients.  Critical care was necessary to treat or prevent imminent or life-threatening deterioration.  Critical care was time spent personally by me on the following  activities: development of treatment plan with patient and/or surrogate as well as nursing, discussions with consultants, evaluation of patient's response to treatment, examination of patient, obtaining history from patient or surrogate, ordering and performing treatments and interventions, ordering and review of laboratory studies, ordering and review of radiographic studies, pulse oximetry and re-evaluation of patient's condition. Labs Review Labs Reviewed  COMPREHENSIVE METABOLIC PANEL - Abnormal; Notable for the following:    Sodium 125 (*)    Potassium 6.1 (*)    Chloride 92 (*)    Glucose, Bld 122 (*)    BUN 28 (*)    Creatinine, Ser 2.19 (*)    Albumin 3.0 (*)    AST 99 (*)    ALT 75 (*)    Alkaline Phosphatase 299 (*)    GFR calc non Af Amer 20 (*)    GFR calc Af Amer 23 (*)    All other components within normal limits  CBC WITH DIFFERENTIAL/PLATELET - Abnormal; Notable for the following:    RBC 3.56 (*)    Hemoglobin 11.2 (*)    HCT 33.1 (*)    RDW 17.2 (*)    All other components within normal limits  PROTIME-INR - Abnormal; Notable for the following:    Prothrombin Time 19.9 (*)    INR 1.76 (*)    All other components within normal limits  CORTISOL-AM, BLOOD - Abnormal; Notable for the following:    Cortisol - AM 26.1 (*)    All other components within normal limits  LACTIC ACID, PLASMA - Abnormal; Notable for the following:    Lactic Acid, Venous 3.9 (*)    All other components within normal limits  I-STAT CG4 LACTIC ACID, ED - Abnormal; Notable for the following:    Lactic Acid, Venous 2.78 (*)    All other components within normal limits  MRSA PCR SCREENING  CULTURE, BLOOD (ROUTINE X 2)  URINE CULTURE  RESPIRATORY VIRUS PANEL  CULTURE, EXPECTORATED SPUTUM-ASSESSMENT  GRAM STAIN  TROPONIN I  LIPASE, BLOOD  PROCALCITONIN  APTT  URINALYSIS, ROUTINE W REFLEX MICROSCOPIC (NOT AT [facility])  CREATININE, URINE, RANDOM  UREA NITROGEN, URINE  BRAIN NATRIURETIC PEPTIDE   INFLUENZA PANEL BY PCR (TYPE A & B, H1N1)  LACTIC ACID, PLASMA  LIPID PANEL  STREP PNEUMONIAE URINARY ANTIGEN  LEGIONELLA PNEUMOPHILA SEROGP 1 UR AG  HIV ANTIBODY (ROUTINE TESTING)  I-STAT CG4 LACTIC ACID, ED  Imaging Review Dg Chest 2 View  12/15/2015  CLINICAL DATA:  Weakness and shortness of Breath EXAM: CHEST  2 VIEW COMPARISON:  12/12/2015 FINDINGS: Cardiac shadow remains enlarged. The lungs are again well aerated. Some increasing density is noted in the right upper lobe which may represent some early infiltrate. Chronic interstitial changes are noted. No sizable effusion is seen. No acute bony abnormality is noted. IMPRESSION: Increased density in the right upper lobe which may represent acute on chronic infiltrate. Electronically Signed   By: Alcide Clever M.D.   On: 12/15/2015 17:08   I have personally reviewed and evaluated these images and lab results as part of my medical decision-making.  EKG: interpreted by Iyahna Obriant MD SR 96  PR 198 QTc 524 IVCD. LBBB no significant change from old.  Consult: Hospitalist for admission. Requests consult to critical care to discuss management of patient with complex comorbid illnesses. Consult: Intensivist, Recommends conservative fluid hydration. Agrees at this time symptoms are not highly suggestive of sepsis and in light of the patient's severe underlying heart failure and pulmonary hypertension, with clinically stable appearance will not proceed with sepsis fluid bolus.  MDM   Final diagnoses:  Hypotension, unspecified hypotension type  Nausea  Severe comorbid illness    Patient presents with onset of nausea and several episodes of vomiting. She is hypotensive. Fluid hydration is initiated in increments of 500 mL. Patient is not febrile, does not have leukocytosis. Clinically she shows signs of adequate perfusion with clear mental status and no immediate respiratory distress. She does not have chest pain  or confusion. Consultation has  been made to intensivist and to the hospitalist for admission.    Arby Barrette, MD 12/16/15 [phone]

## 2015-12-15 NOTE — Progress Notes (Signed)
Utilization Review completed.  Isabellarose Kope RN CM  

## 2015-12-15 NOTE — ED Notes (Signed)
1st attempt to obtain urine specimen unsuccessful, pt unable to urinate RN notified.

## 2015-12-16 ENCOUNTER — Inpatient Hospital Stay (HOSPITAL_COMMUNITY): Payer: Medicare Other

## 2015-12-16 ENCOUNTER — Other Ambulatory Visit (HOSPITAL_COMMUNITY): Payer: Medicare Other

## 2015-12-16 DIAGNOSIS — A419 Sepsis, unspecified organism: Secondary | ICD-10-CM

## 2015-12-16 DIAGNOSIS — E871 Hypo-osmolality and hyponatremia: Secondary | ICD-10-CM

## 2015-12-16 DIAGNOSIS — I9589 Other hypotension: Secondary | ICD-10-CM

## 2015-12-16 DIAGNOSIS — E440 Moderate protein-calorie malnutrition: Secondary | ICD-10-CM

## 2015-12-16 DIAGNOSIS — R109 Unspecified abdominal pain: Secondary | ICD-10-CM | POA: Insufficient documentation

## 2015-12-16 DIAGNOSIS — N949 Unspecified condition associated with female genital organs and menstrual cycle: Secondary | ICD-10-CM

## 2015-12-16 DIAGNOSIS — E872 Acidosis, unspecified: Secondary | ICD-10-CM | POA: Insufficient documentation

## 2015-12-16 DIAGNOSIS — R1012 Left upper quadrant pain: Secondary | ICD-10-CM

## 2015-12-16 LAB — COMPREHENSIVE METABOLIC PANEL
ALBUMIN: 2.6 g/dL — AB (ref 3.5–5.0)
ALK PHOS: 232 U/L — AB (ref 38–126)
ALT: 91 U/L — ABNORMAL HIGH (ref 14–54)
ANION GAP: 15 (ref 5–15)
AST: 110 U/L — ABNORMAL HIGH (ref 15–41)
BILIRUBIN TOTAL: 1.6 mg/dL — AB (ref 0.3–1.2)
BUN: 34 mg/dL — ABNORMAL HIGH (ref 6–20)
CALCIUM: 8.6 mg/dL — AB (ref 8.9–10.3)
CO2: 20 mmol/L — ABNORMAL LOW (ref 22–32)
Chloride: 95 mmol/L — ABNORMAL LOW (ref 101–111)
Creatinine, Ser: 2.32 mg/dL — ABNORMAL HIGH (ref 0.44–1.00)
GFR, EST AFRICAN AMERICAN: 21 mL/min — AB (ref >60)
GFR, EST NON AFRICAN AMERICAN: 18 mL/min — AB (ref >60)
GLUCOSE: 101 mg/dL — AB (ref 65–99)
POTASSIUM: 5.7 mmol/L — AB (ref 3.5–5.1)
Sodium: 130 mmol/L — ABNORMAL LOW (ref 135–145)
TOTAL PROTEIN: 6.1 g/dL — AB (ref 6.5–8.1)

## 2015-12-16 LAB — EXPECTORATED SPUTUM ASSESSMENT W GRAM STAIN, RFLX TO RESP C

## 2015-12-16 LAB — CREATININE, URINE, RANDOM: Creatinine, Urine: 93.19 mg/dL

## 2015-12-16 LAB — CBC
HEMATOCRIT: 29.9 % — AB (ref 36.0–46.0)
HEMOGLOBIN: 10.1 g/dL — AB (ref 12.0–15.0)
MCH: 31.9 pg (ref 26.0–34.0)
MCHC: 33.8 g/dL (ref 30.0–36.0)
MCV: 94.3 fL (ref 78.0–100.0)
Platelets: 288 10*3/uL (ref 150–400)
RBC: 3.17 MIL/uL — AB (ref 3.87–5.11)
RDW: 17.4 % — ABNORMAL HIGH (ref 11.5–15.5)
WBC: 7.7 10*3/uL (ref 4.0–10.5)

## 2015-12-16 LAB — GLUCOSE, CAPILLARY
GLUCOSE-CAPILLARY: 91 mg/dL (ref 65–99)
GLUCOSE-CAPILLARY: 106 mg/dL — AB (ref 65–99)
Glucose-Capillary: 77 mg/dL (ref 65–99)

## 2015-12-16 LAB — URINALYSIS, ROUTINE W REFLEX MICROSCOPIC
BILIRUBIN URINE: NEGATIVE
GLUCOSE, UA: NEGATIVE mg/dL
HGB URINE DIPSTICK: NEGATIVE
Ketones, ur: NEGATIVE mg/dL
Leukocytes, UA: NEGATIVE
Nitrite: NEGATIVE
Protein, ur: NEGATIVE mg/dL
SPECIFIC GRAVITY, URINE: 1.03 (ref 1.005–1.030)
pH: 5 (ref 5.0–8.0)

## 2015-12-16 LAB — INFLUENZA PANEL BY PCR (TYPE A & B)
H1N1FLUPCR: NOT DETECTED
INFLAPCR: NEGATIVE
Influenza B By PCR: NEGATIVE

## 2015-12-16 LAB — LIPID PANEL
CHOL/HDL RATIO: 2.3 ratio
CHOLESTEROL: 90 mg/dL (ref 0–200)
HDL: 40 mg/dL — ABNORMAL LOW (ref >40)
LDL Cholesterol: 37 mg/dL (ref 0–99)
TRIGLYCERIDES: 67 mg/dL (ref <150)
VLDL: 13 mg/dL (ref 0–40)

## 2015-12-16 LAB — BASIC METABOLIC PANEL
Anion gap: 11 (ref 5–15)
BUN: 36 mg/dL — ABNORMAL HIGH (ref 6–20)
CHLORIDE: 96 mmol/L — AB (ref 101–111)
CO2: 21 mmol/L — AB (ref 22–32)
CREATININE: 2.36 mg/dL — AB (ref 0.44–1.00)
Calcium: 8.4 mg/dL — ABNORMAL LOW (ref 8.9–10.3)
GFR calc non Af Amer: 18 mL/min — ABNORMAL LOW (ref >60)
GFR, EST AFRICAN AMERICAN: 21 mL/min — AB (ref >60)
Glucose, Bld: 80 mg/dL (ref 65–99)
Potassium: 5.4 mmol/L — ABNORMAL HIGH (ref 3.5–5.1)
Sodium: 128 mmol/L — ABNORMAL LOW (ref 135–145)

## 2015-12-16 LAB — BRAIN NATRIURETIC PEPTIDE: B Natriuretic Peptide: 1426.5 pg/mL — ABNORMAL HIGH (ref 0.0–100.0)

## 2015-12-16 LAB — CORTISOL-AM, BLOOD: CORTISOL - AM: 26.1 ug/dL — AB (ref 6.7–22.6)

## 2015-12-16 LAB — EXPECTORATED SPUTUM ASSESSMENT W REFEX TO RESP CULTURE

## 2015-12-16 LAB — LACTIC ACID, PLASMA
LACTIC ACID, VENOUS: 4 mmol/L — AB (ref 0.5–2.0)
LACTIC ACID, VENOUS: 3.8 mmol/L — AB (ref 0.5–2.0)

## 2015-12-16 LAB — STREP PNEUMONIAE URINARY ANTIGEN: STREP PNEUMO URINARY ANTIGEN: NEGATIVE

## 2015-12-16 MED ORDER — SODIUM CHLORIDE 0.9 % IV BOLUS (SEPSIS)
250.0000 mL | Freq: Once | INTRAVENOUS | Status: AC
  Administered 2015-12-16: 250 mL via INTRAVENOUS

## 2015-12-16 MED ORDER — LORAZEPAM 2 MG/ML IJ SOLN: 1.0000 mg | INTRAMUSCULAR | Status: DC | PRN

## 2015-12-16 MED ORDER — TECHNETIUM TC 99M MEBROFENIN IV KIT
5.2000 | PACK | Freq: Once | INTRAVENOUS | Status: AC | PRN
  Administered 2015-12-16: 5 via INTRAVENOUS

## 2015-12-16 MED ORDER — APIXABAN 2.5 MG PO TABS
2.5000 mg | ORAL_TABLET | Freq: Two times a day (BID) | ORAL | Status: DC
  Administered 2015-12-16 – 2015-12-20 (×8): 2.5 mg via ORAL
  Filled 2015-12-16 (×8): qty 1

## 2015-12-16 MED ORDER — SODIUM CHLORIDE 0.9 % IV SOLN
INTRAVENOUS | Status: DC
  Administered 2015-12-17 – 2015-12-18 (×4): via INTRAVENOUS

## 2015-12-16 NOTE — Progress Notes (Signed)
Patient returned from radiology. Able to stand pivot to bsc. Large bm/ small void.

## 2015-12-16 NOTE — Progress Notes (Signed)
US shows no gallstones but some wall thickening and pericholecystectomy fluid.  She has hypoalbuminemia.  Will check HIDA.

## 2015-12-16 NOTE — Consult Note (Signed)
[facility]  [address] [place], [place] [address] Phone: [phone] FAX: [phone]     Theresa Cortez  1930/06/03 [id number]  CARE TEAM:  PCP: Rogers Blocker, MD  Outpatient Care Team: Patient Care Team: Rogers Blocker, MD as PCP - General (Internal Medicine) Sanda Klein, MD as Consulting Physician (Cardiology)  Inpatient Treatment Team: Treatment Team: Attending Provider: Ivor Costa, MD; Rounding Team: Md Pccm, MD; Rounding Team: Ian Bushman, MD; Consulting Physician: Nolon Nations, MD  This patient is a 82 y.o.female who presents today for surgical evaluation at the request of Dr Blaine Hamper, [facility].   Reason for evaluation: Acute cholecystitis  Pleasant elderly woman.  Developed congestive heart failure.  Required diuresis and stabilization last month.  Send to skilled nursing facility with rehabilitation.  Readmitted.  Some shock.  Concern for septic infectious appearance.  Productive cough but no evidence of pneumonia.  Perhaps mild bronchiectasis.  A normal CAT scan a week before.  Have nausea vomiting with some right-sided abdominal pain as well.  Repeat CT scan a few hours ago showing thickening of the gallbladder suspicious for possible cholecystitis.  If lay more than one seen 5 days ago.  Surgical consultation requested given lack of evidence of infection elsewhere.  Patient used to be able to walk a little bit with a walker but has been more bedridden with last admission.  Numerous health issues.  Should.  Diabetes.  Stroke in 2000.  Anticoagulated on [place] was.  Rheumatoid arthritis on methotrexate.  Chronic kidney disease.  Stage III.  Admitted with creatinine around 2.  Heart failure.  Ejection fraction around 25%.  Newly diagnosed atrial fibrillation.  She recalls having abdominal surgery.  Believe is Dr. Deon Pilling in 2005 for diverticulitis.  No abdominal surgery since.  No history of bowel obstructions.  No personal nor  family history of GI/colon cancer, inflammatory bowel disease, irritable bowel syndrome, allergy such as Celiac Sprue, dietary/dairy problems, colitis, ulcers nor gastritis.  No recent sick contacts/gastroenteritis.  No travel outside the country.  No changes in diet.  No dysphagia to solids or liquids.  No significant heartburn or reflux.  No hematochezia, hematemesis, coffee ground emesis.  No evidence of prior gastric/peptic ulceration.  Tends to be conscious..  Usually improves milk of magnesia or an occasional suppository.  Had a bowel movement yesterday with suppository.    Past Medical History  Diagnosis Date  . Hypertension   . Wound infection ([place])     Right knee   . Hypothyroidism   . History of diverticulosis   . Hyperlipidemia   . Type II diabetes mellitus ([place])   . History of blood transfusion X 2    w/knee replacement  . Stroke [facility]) 2000    denies residual on 11/23/2015  . Rheumatoid arthritis(714.0)   . DJD (degenerative joint disease)   . Arthritis     "hands, arms" (11/23/2015)  . CKD (chronic kidney disease), stage III   . Chronic combined systolic and diastolic heart failure ([place])   . Atrial fibrillation [facility])     Past Surgical History  Procedure Laterality Date  . Knee arthroscopy Right   . Total knee arthroplasty Right 08/07/2011; 2014  . Partial colectomy      descending colon by Dr. Deon Pilling  . Appendectomy    . Medial partial knee replacement Right   . Incision and drainage mouth Right 2014    "knee; took replacement out; put  spacers  in"  . Tubal ligation    . Cataract extraction w/ intraocular lens implant Right   . Cardiac catheterization N/A 11/25/2015    Procedure: Right/Left Heart Cath and Coronary Angiography;  Surgeon: Wellington Hampshire, MD;  Location: [facility];  Service: Cardiovascular;  Laterality: N/A;    Social History   Social History  . Marital Status: Widowed    Spouse Name: N/A  . Number of Children: N/A  . Years of  Education: N/A   Occupational History  . Not on file.   Social History Main Topics  . Smoking status: Never Smoker   . Smokeless tobacco: Former Systems developer    Types: Snuff  . Alcohol Use: No  . Drug Use: No  . Sexual Activity: No   Other Topics Concern  . Not on file   Social History Narrative    Family History  Problem Relation Age of Onset  . Hypertension Mother     Current Facility-Administered Medications  Medication Dose Route Frequency Provider Last Rate Last Dose  . 0.9 %  sodium chloride infusion   Intravenous Continuous Juanito Doom, MD 75 mL/hr at 12/16/15 0527    . albuterol (PROVENTIL) (2.5 MG/3ML) 0.083% nebulizer solution 2.5 mg  2.5 mg Nebulization Q4H PRN Ivor Costa, MD      . alum & mag hydroxide-simeth (MAALOX/MYLANTA) 200-200-20 MG/5ML suspension 30 mL  30 mL Oral Q6H PRN Ivor Costa, MD      . amiodarone (PACERONE) tablet 200 mg  200 mg Oral BID Ivor Costa, MD   200 mg at 12/15/15 2242  . atorvastatin (LIPITOR) tablet 10 mg  10 mg Oral Daily Ivor Costa, MD      . aztreonam (AZACTAM) 1 g in dextrose 5 % 50 mL IVPB  1 g Intravenous 3 times per day Charlesetta Shanks, MD   1 g at 12/16/15 0527  . folic acid (FOLVITE) tablet 1 mg  1 mg Oral Daily Ivor Costa, MD   1 mg at 12/15/15 2244  . guaiFENesin-codeine 100-10 MG/5ML solution 10 mL  10 mL Oral QHS PRN Ivor Costa, MD      . hydrocortisone sodium succinate (SOLU-CORTEF) 100 MG injection 50 mg  50 mg Intravenous Q6H Juanito Doom, MD   50 mg at 12/16/15 0320  . hydroxychloroquine (PLAQUENIL) tablet 200 mg  200 mg Oral BID Ivor Costa, MD   200 mg at 12/15/15 2243  . insulin aspart (novoLOG) injection 0-5 Units  0-5 Units Subcutaneous QHS Ivor Costa, MD   0 Units at 12/15/15 2200  . insulin aspart (novoLOG) injection 0-9 Units  0-9 Units Subcutaneous TID WC Ivor Costa, MD      . latanoprost (XALATAN) 0.005 % ophthalmic solution 1 drop  1 drop Both Eyes QHS Ivor Costa, MD   1 drop at 12/15/15 2249  . [START ON 12/17/2015]  levofloxacin (LEVAQUIN) IVPB 750 mg  750 mg Intravenous Q48H Charlesetta Shanks, MD      . levothyroxine (SYNTHROID, LEVOTHROID) tablet 75 mcg  75 mcg Oral QAC breakfast Ivor Costa, MD      . LORazepam (ATIVAN) injection 1 mg  1 mg Intravenous PRN Ivor Costa, MD      . morphine 2 MG/ML injection 1 mg  1 mg Intravenous Q4H PRN Ivor Costa, MD      . ondansetron [facility]) injection 4 mg  4 mg Intravenous Q8H PRN Ivor Costa, MD      . senna (SENOKOT) tablet 8.6 mg  1 tablet  Oral QHS PRN Ivor Costa, MD      . vancomycin (VANCOCIN) IVPB 750 mg/150 ml premix  750 mg Intravenous Q24H Charlesetta Shanks, MD         Allergies  Allergen Reactions  . Penicillins Swelling    ROS: Constitutional:  No fevers, chills, sweats.  Weight stable Eyes:  No vision changes, No discharge HENT:  No sore throats, nasal drainage Lymph: No neck swelling, No bruising easily Pulmonary:  No cough, productive sputum CV: No orthopnea, PND  Patient walks with walker only a short distance without difficulty.  No exertional chest/neck/shoulder/arm pain. GI:  No personal nor family history of GI/colon cancer, inflammatory bowel disease, irritable bowel syndrome, allergy such as Celiac Sprue, dietary/dairy problems, colitis, ulcers nor gastritis.  No recent sick contacts/gastroenteritis.  No travel outside the country.  No changes in diet. Renal: No UTIs, No hematuria Genital:  No drainage, bleeding, masses Musculoskeletal: No severe joint pain.  Good ROM major joints Skin:  No sores or lesions.  No rashes Heme/Lymph:  No easy bleeding.  No swollen lymph nodes Neuro: No focal weakness/numbness.  No seizures Psych: No suicidal ideation.  No hallucinations  BP 87/60 mmHg  Pulse 37  Temp(Src) 97.6 F (36.4 C) (Oral)  Resp 18  Ht '5\' 6"'$  (1.676 m)  Wt 83 kg (182 lb 15.7 oz)  BMI 29.55 kg/m2  SpO2 85%  Physical Exam: General: Pt awake/alert/oriented x3 in no major acute distress Eyes: PERRL, normal EOM. Sclera nonicteric Neuro: CN  II-XII intact w/o focal sensory/motor deficits. Lymph: No head/neck/groin lymphadenopathy Psych:  No delerium/psychosis/paranoia HENT: Normocephalic, Mucus membranes moist.  No thrush Neck: Supple, No tracheal deviation Chest: No pain.  Good respiratory excursion. CV:  Pulses intact.  Irregular rhythm/rate Abdomen: Soft, Obese.  Nondistended.  Nontender.  Mild discomfort in reference quadrant at the most.  No Murphy sign.  Low midline incision.  No umbilical hernia.  No incarcerated hernias. Ext:  SCDs BLE.  No significant edema.  No cyanosis Skin: No petechiae / purpurea.  No major sores Musculoskeletal: No severe joint pain.  Good ROM major joints   Results:   Labs: Results for orders placed or performed during the hospital encounter of 12/15/15 (from the past 48 hour(s))  Comprehensive metabolic panel     Status: Abnormal   Collection Time: 12/15/15  4:32 PM  Result Value Ref Range   Sodium 125 (L) 135 - 145 mmol/L   Potassium 6.1 (HH) 3.5 - 5.1 mmol/L    Comment: NO VISIBLE HEMOLYSIS CRITICAL RESULT CALLED TO, READ BACK BY AND VERIFIED WITH: NEASE,A. RN '@1740'$  ON 4.5.17 BY MCCOY,N.    Chloride 92 (L) 101 - 111 mmol/L   CO2 22 22 - 32 mmol/L   Glucose, Bld 122 (H) 65 - 99 mg/dL   BUN 28 (H) 6 - 20 mg/dL   Creatinine, Ser 2.19 (H) 0.44 - 1.00 mg/dL   Calcium 8.9 8.9 - 10.3 mg/dL   Total Protein 7.3 6.5 - 8.1 g/dL   Albumin 3.0 (L) 3.5 - 5.0 g/dL   AST 99 (H) 15 - 41 U/L   ALT 75 (H) 14 - 54 U/L   Alkaline Phosphatase 299 (H) 38 - 126 U/L   Total Bilirubin 1.2 0.3 - 1.2 mg/dL   GFR calc non Af Amer 20 (L) >60 mL/min   GFR calc Af Amer 23 (L) >60 mL/min    Comment: (NOTE) The eGFR has been calculated using the CKD EPI equation. This calculation has not  been validated in all clinical situations. eGFR's persistently <60 mL/min signify possible Chronic Kidney Disease.    Anion gap 11 5 - 15  Troponin I     Status: None   Collection Time: 12/15/15  4:32 PM  Result Value  Ref Range   Troponin I <0.03 <0.031 ng/mL    Comment:        NO INDICATION OF MYOCARDIAL INJURY.   CBC with Differential     Status: Abnormal   Collection Time: 12/15/15  4:32 PM  Result Value Ref Range   WBC 7.3 4.0 - 10.5 K/uL   RBC 3.56 (L) 3.87 - 5.11 MIL/uL   Hemoglobin 11.2 (L) 12.0 - 15.0 g/dL   HCT 33.1 (L) 36.0 - 46.0 %   MCV 93.0 78.0 - 100.0 fL   MCH 31.5 26.0 - 34.0 pg   MCHC 33.8 30.0 - 36.0 g/dL   RDW 17.2 (H) 11.5 - 15.5 %   Platelets 306 150 - 400 K/uL   Neutrophils Relative % 77 %   Neutro Abs 5.6 1.7 - 7.7 K/uL   Lymphocytes Relative 14 %   Lymphs Abs 1.0 0.7 - 4.0 K/uL   Monocytes Relative 9 %   Monocytes Absolute 0.6 0.1 - 1.0 K/uL   Eosinophils Relative 0 %   Eosinophils Absolute 0.0 0.0 - 0.7 K/uL   Basophils Relative 0 %   Basophils Absolute 0.0 0.0 - 0.1 K/uL  Protime-INR     Status: Abnormal   Collection Time: 12/15/15  4:44 PM  Result Value Ref Range   Prothrombin Time 19.9 (H) 11.6 - 15.2 seconds   INR 1.76 (H) 0.00 - 1.49  Lipase, blood     Status: None   Collection Time: 12/15/15  4:44 PM  Result Value Ref Range   Lipase 27 11 - 51 U/L  I-Stat CG4 Lactic Acid, ED     Status: Abnormal   Collection Time: 12/15/15  4:59 PM  Result Value Ref Range   Lactic Acid, Venous 2.78 (HH) 0.5 - 2.0 mmol/L   Comment NOTIFIED PHYSICIAN   Cortisol-am, blood     Status: Abnormal   Collection Time: 12/15/15  9:35 PM  Result Value Ref Range   Cortisol - AM 26.1 (H) 6.7 - 22.6 ug/dL    Comment: Performed at [facility]  Lactic acid, plasma     Status: Abnormal   Collection Time: 12/15/15  9:35 PM  Result Value Ref Range   Lactic Acid, Venous 3.9 (HH) 0.5 - 2.0 mmol/L    Comment: CRITICAL RESULT CALLED TO, READ BACK BY AND VERIFIED WITH: J.KAAT,RN AT 2257 ON 12/15/15 BY W.SHEA   Procalcitonin     Status: None   Collection Time: 12/15/15 10:05 PM  Result Value Ref Range   Procalcitonin 0.25 ng/mL    Comment:        Interpretation: PCT  (Procalcitonin) <= 0.5 ng/mL: Systemic infection (sepsis) is not likely. Local bacterial infection is possible. (NOTE)         ICU PCT Algorithm               Non ICU PCT Algorithm    ----------------------------     ------------------------------         PCT < 0.25 ng/mL                 PCT < 0.1 ng/mL     Stopping of antibiotics            Stopping of  antibiotics       strongly encouraged.               strongly encouraged.    ----------------------------     ------------------------------       PCT level decrease by               PCT < 0.25 ng/mL       >= 80% from peak PCT       OR PCT 0.25 - 0.5 ng/mL          Stopping of antibiotics                                             encouraged.     Stopping of antibiotics           encouraged.    ----------------------------     ------------------------------       PCT level decrease by              PCT >= 0.25 ng/mL       < 80% from peak PCT        AND PCT >= 0.5 ng/mL            Continuin g antibiotics                                              encouraged.       Continuing antibiotics            encouraged.    ----------------------------     ------------------------------     PCT level increase compared          PCT > 0.5 ng/mL         with peak PCT AND          PCT >= 0.5 ng/mL             Escalation of antibiotics                                          strongly encouraged.      Escalation of antibiotics        strongly encouraged.   APTT     Status: None   Collection Time: 12/15/15 10:05 PM  Result Value Ref Range   aPTT 33 24 - 37 seconds  MRSA PCR Screening     Status: None   Collection Time: 12/15/15 10:12 PM  Result Value Ref Range   MRSA by PCR NEGATIVE NEGATIVE    Comment:        The GeneXpert MRSA Assay (FDA approved for NASAL specimens only), is one component of a comprehensive MRSA colonization surveillance program. It is not intended to diagnose MRSA infection nor to guide or monitor treatment  for MRSA infections.   Brain natriuretic peptide     Status: Abnormal   Collection Time: 12/16/15  1:13 AM  Result Value Ref Range   B Natriuretic Peptide 1426.5 (H) 0.0 - 100.0 pg/mL  Lactic acid, plasma     Status: Abnormal   Collection Time: 12/16/15  1:13 AM  Result Value Ref Range   Lactic Acid, Venous 4.0 (  HH) 0.5 - 2.0 mmol/L    Comment: CRITICAL RESULT CALLED TO, READ BACK BY AND VERIFIED WITH: J.KAAT,RN AT 0205 ON 12/16/15 BY W.SHEA   Lipid panel     Status: Abnormal   Collection Time: 12/16/15  1:13 AM  Result Value Ref Range   Cholesterol 90 0 - 200 mg/dL   Triglycerides 67 <150 mg/dL   HDL 40 (L) >40 mg/dL   Total CHOL/HDL Ratio 2.3 RATIO   VLDL 13 0 - 40 mg/dL   LDL Cholesterol 37 0 - 99 mg/dL    Comment:        Total Cholesterol/HDL:CHD Risk Coronary Heart Disease Risk Table                     Men   Women  1/2 Average Risk   3.4   3.3  Average Risk       5.0   4.4  2 X Average Risk   9.6   7.1  3 X Average Risk  23.4   11.0        Use the calculated Patient Ratio above and the CHD Risk Table to determine the patient's CHD Risk.        ATP III CLASSIFICATION (LDL):  <100     mg/dL   Optimal  100-129  mg/dL   Near or Above                    Optimal  130-159  mg/dL   Borderline  160-189  mg/dL   High  >190     mg/dL   Very High Performed at [facility]   Culture, sputum-assessment     Status: None   Collection Time: 12/16/15  4:08 AM  Result Value Ref Range   Specimen Description SPUTUM    Special Requests NONE    Sputum evaluation      THIS SPECIMEN IS ACCEPTABLE. RESPIRATORY CULTURE REPORT TO FOLLOW.   Report Status 12/16/2015 FINAL     Imaging / Studies: Ct Abdomen Pelvis Wo Contrast  12/16/2015  CLINICAL DATA:  Right mid abdominal pain for 3 days. Nausea and vomiting. EXAM: CT ABDOMEN AND PELVIS WITHOUT CONTRAST TECHNIQUE: Multidetector CT imaging of the abdomen and pelvis was performed following the standard protocol without IV contrast.  COMPARISON:  CT 4 days prior 12/12/2015 FINDINGS: Lower chest: Stable cardiomegaly. Bibasilar bronchiectasis, bibasilar opacities, and small pleural effusions, unchanged from prior exam. Liver: No focal lesion allowing for lack contrast. Hepatobiliary: High-density material within the gallbladder, likely combination of sludge and stones. Question of gallbladder wall thickening and pericholecystic soft tissue stranding. No evidence of biliary dilatation. Pancreas: No ductal dilatation or inflammation. Suboptimal pancreatic assessment given lack contrast. Spleen: Normal. Adrenal glands: Bilateral thickening, no nodule. Kidneys: Bilateral perinephric soft tissue stranding, new from prior. No hydronephrosis. Stomach/Bowel: No abnormal gastric distention, small hiatal hernia. Limited bowel assessment given lack contrast. There are no dilated or thickened small bowel loops. Enteric chain sutures noted in the left abdomen. No evidence of bowel obstruction. Moderate stool burden. The appendix is not visualized. Vascular/Lymphatic: No retroperitoneal adenopathy. Abdominal aorta is normal in caliber. Moderate atherosclerosis of the abdominal aorta without aneurysm. Reproductive: Left ovarian/adnexal cyst measures 2.6 cm, unchanged from recent prior. Uterus remains in situ. Right ovary is not well seen. Bladder: Physiologically distended. Other: Development of small volume free fluid in both pericolic gutters and in the pelvis. Mild mesenteric edema. No free air. Musculoskeletal: There are no acute  or suspicious osseous abnormalities. Degenerative change in the lumbar spine. IMPRESSION: 1. High-density material in the gallbladder, likely combination of small stones and sludge. Questionable gallbladder wall thickening and pericholecystic soft tissue stranding. Given right-sided abdominal pain, acute cholecystitis is considered. Further evaluation with right upper quadrant ultrasound recommended. 2. Development of small volume  free fluid in the abdomen and pelvis. Mesenteric and perinephric edema. Findings may be related to hydration status. Correlation with urinalysis recommended to exclude urinary tract infection. 3. Left adnexal cyst measures 2.6 cm, this is unchanged from recent prior, however abnormal in a postmenopausal patient of this age. Nonemergent sonographic follow-up is recommended. 4. Additional chronic findings are stable. Electronically Signed   By: Jeb Levering M.D.   On: 12/16/2015 04:15   Ct Abdomen Pelvis Wo Contrast  12/12/2015  CLINICAL DATA:  Acute onset of right flank pain and increased generalized weakness. Initial encounter. EXAM: CT ABDOMEN AND PELVIS WITHOUT CONTRAST TECHNIQUE: Multidetector CT imaging of the abdomen and pelvis was performed following the standard protocol without IV contrast. COMPARISON:  CT of the abdomen and pelvis performed 12/28/2004, and right upper quadrant ultrasound performed 11/23/2015 FINDINGS: Bibasilar bronchiectasis is noted. Patchy bibasilar opacities likely reflect atelectasis or scarring. The heart is mildly enlarged. The liver and spleen are unremarkable in appearance. The gallbladder is within normal limits. The pancreas. Is unremarkable. There is prominence of the adrenal glands, concerning for mild adrenal hyperplasia. The kidneys are unremarkable in appearance. There is no evidence of hydronephrosis. No renal or ureteral stones are seen. No perinephric stranding is appreciated. No free fluid is identified. The small bowel is unremarkable in appearance. The stomach is within normal limits. No acute vascular abnormalities are seen. Mild scattered calcification is seen along the abdominal aorta and its branches. The patient is status post appendectomy. The colon is unremarkable in appearance. The bladder is mildly distended and grossly remarkable. A small fibroid is suggested at the lower uterine segment. The uterus is otherwise unremarkable. The ovaries are grossly  symmetric, aside from a likely 2.5 cm left adnexal cyst. No inguinal lymphadenopathy is seen. No acute osseous abnormalities are identified. Facet disease is noted at the lower lumbar spine. IMPRESSION: 1. No acute abnormality seen to explain the patient's symptoms. 2. Bibasilar bronchiectasis noted. Patchy bibasilar airspace opacities likely reflect atelectasis or scarring. 3. Prominence of the adrenal glands, concerning for mild adrenal hyperplasia. 4. Mild scattered calcification along the abdominal aorta and its branches. 5. Small uterine fibroid suggested. 6. Mild cardiomegaly. Electronically Signed   By: Garald Balding M.D.   On: 12/12/2015 23:06   Dg Chest 2 View  12/15/2015  CLINICAL DATA:  Weakness and shortness of Breath EXAM: CHEST  2 VIEW COMPARISON:  12/12/2015 FINDINGS: Cardiac shadow remains enlarged. The lungs are again well aerated. Some increasing density is noted in the right upper lobe which may represent some early infiltrate. Chronic interstitial changes are noted. No sizable effusion is seen. No acute bony abnormality is noted. IMPRESSION: Increased density in the right upper lobe which may represent acute on chronic infiltrate. Electronically Signed   By: Inez Catalina M.D.   On: 12/15/2015 17:08   Dg Chest 2 View  12/12/2015  CLINICAL DATA:  Back and right sided flank pain for several days EXAM: CHEST  2 VIEW COMPARISON:  None. FINDINGS: Cardiac shadow is mildly enlarged. Chronic bibasilar opacities are noted. No focal infiltrate or sizable effusion is noted. No bony abnormality is noted. IMPRESSION: Chronic changes without acute abnormality. Electronically Signed  By: Inez Catalina M.D.   On: 12/12/2015 21:26   Dg Chest 2 View  12/06/2015  CLINICAL DATA:  Cough for 2 weeks.  Congestion. EXAM: CHEST  2 VIEW COMPARISON:  11/25/2015 FINDINGS: There is hyperinflation of the lungs compatible with COPD. Patchy airspace disease noted over both mid and lower lungs, new since prior study  concerning for multifocal pneumonia. No pleural effusions. Heart is mildly enlarged. No acute bony abnormality. IMPRESSION: COPD, cardiomegaly. Patchy bilateral mid and lower lung airspace opacities concerning for pneumonia. Electronically Signed   By: Rolm Baptise M.D.   On: 12/06/2015 15:03   Dg Chest 2 View  11/22/2015  CLINICAL DATA:  Cough for 2 day EXAM: CHEST  2 VIEW COMPARISON:  04/26/2015 FINDINGS: The heart is moderately enlarged. Lungs are hyperaerated. Pulmonary vascular is within normal limits. Tiny pleural effusions and bibasilar atelectasis. No pneumothorax. IMPRESSION: Cardiomegaly without decompensation. Tiny pleural effusions and bibasilar atelectasis. Electronically Signed   By: Marybelle Killings M.D.   On: 11/22/2015 18:02   Dg Thoracic Spine W/swimmers  11/28/2015  CLINICAL DATA:  Cough and upper back pain. EXAM: THORACIC SPINE - 3 VIEWS COMPARISON:  Portable chest dated 11/25/2015. Two-view chest dated 11/22/2015. FINDINGS: Multilevel degenerative changes in the thoracic and cervical spine. No fractures or subluxations. IMPRESSION: No acute abnormality.  Multilevel degenerative changes. Electronically Signed   By: Claudie Revering M.D.   On: 11/28/2015 11:06   US Renal  11/23/2015  CLINICAL DATA:  Acute renal failure EXAM: RENAL / URINARY TRACT ULTRASOUND COMPLETE COMPARISON:  01/27/2011 FINDINGS: Right Kidney: Length: 10.9 cm. Normal echogenicity with no mass or hydronephrosis. 9 x 5 x 5 mm exophytic lower pole cyst. Left Kidney: Length: 9.7 cm. Echogenicity within normal limits. No mass or hydronephrosis visualized. Bladder: Appears normal for degree of bladder distention. IMPRESSION: No significant abnormalities Electronically Signed   By: Skipper Cliche M.D.   On: 11/23/2015 12:21   Nm Pulmonary Perf And Vent  11/23/2015  CLINICAL DATA:  82 year old female with progressive shortness of breath cough and mid scapular pain. Abnormal D-dimer. Initial encounter. EXAM: NUCLEAR MEDICINE  VENTILATION - PERFUSION LUNG SCAN TECHNIQUE: Ventilation images were obtained in multiple projections using inhaled aerosol Tc-4mDTPA. Perfusion images were obtained in multiple projections after intravenous injection of Tc-955mAA. RADIOPHARMACEUTICALS:  32.0 Technetium-9940mPA aerosol inhalation and 4.3 Technetium-59m21m IV COMPARISON:  PA and lateral chest radiographs 11/22/2015. FINDINGS: Ventilation: No ventilation defect identified. Photopenia related to cardiomegaly noted. Perfusion: Homogeneous perfusion radiotracer activity. No wedge shaped peripheral perfusion defects to suggest acute pulmonary embolism. IMPRESSION: Normal VQ scan. Electronically Signed   By: H  HGenevie Ann.   On: 11/23/2015 16:07   Dg Chest Port 1v Same Day  11/25/2015  CLINICAL DATA:  Chest wheezing today. EXAM: PORTABLE CHEST 1 VIEW COMPARISON:  November 22, 2015 FINDINGS: The heart size and mediastinal contours are stable. The heart size is enlarged. The lungs are hyperinflated. There is no focal infiltrate, pulmonary edema, or pleural effusion. The visualized skeletal structures are stable. IMPRESSION: No active cardiopulmonary disease.  Cardiomegaly.  Emphysema. Electronically Signed   By: Wei-Abelardo Diesel.   On: 11/25/2015 16:58   Us [place] Limited Ruq  11/23/2015  CLINICAL DATA:  Elevated liver function tests EXAM: US [place]OM[place] LIMITED - RIGHT UPPER QUADRANT COMPARISON:  12/28/2004 FINDINGS: Gallbladder: No gallstones or wall thickening visualized. No sonographic Murphy sign noted by sonographer. Common bile duct: Diameter: 3 mm Liver: No focal lesion identified. Within normal limits in  parenchymal echogenicity. IMPRESSION: Normal right upper quadrant ultrasound Electronically Signed   By: Skipper Cliche M.D.   On: 11/23/2015 12:18    Medications / Allergies: per chart  Antibiotics: Anti-infectives    Start     Dose/Rate Route Frequency Ordered Stop   12/17/15 2200  levofloxacin (LEVAQUIN) IVPB 750 mg     750 mg 100  mL/hr over 90 Minutes Intravenous Every 48 hours 12/15/15 1952     12/16/15 1800  vancomycin (VANCOCIN) IVPB 750 mg/150 ml premix     750 mg 150 mL/hr over 60 Minutes Intravenous Every 24 hours 12/15/15 1959     12/16/15 0600  aztreonam (AZACTAM) 1 g in dextrose 5 % 50 mL IVPB     1 g 100 mL/hr over 30 Minutes Intravenous 3 times per day 12/15/15 1950     12/15/15 2200  hydroxychloroquine (PLAQUENIL) tablet 200 mg     200 mg Oral 2 times daily 12/15/15 2001     12/15/15 1945  levofloxacin (LEVAQUIN) IVPB 750 mg     750 mg 100 mL/hr over 90 Minutes Intravenous  Once 12/15/15 1935 12/16/15 0005   12/15/15 1945  aztreonam (AZACTAM) 2 g in dextrose 5 % 50 mL IVPB     2 g 100 mL/hr over 30 Minutes Intravenous  Once 12/15/15 1935 12/15/15 2056   12/15/15 1945  vancomycin (VANCOCIN) IVPB 1000 mg/200 mL premix     1,000 mg 200 mL/hr over 60 Minutes Intravenous  Once 12/15/15 1935 12/15/15 2129      Assessment  Rockey Situ  82 y.o. female       Problem List:  Principal Problem:   HCAP (healthcare-associated pneumonia) Active Problems:   Hypertension   Leg pain, right   Chronic combined systolic and diastolic heart failure (HCC)   Tricuspid regurgitation   Pulmonary hypertension (HCC)   Diabetes mellitus, type 2 (HCC)   Acute renal failure superimposed on stage 3 chronic kidney disease (HCC)   HLD (hyperlipidemia)   Hypothyroidism   Sepsis (HCC)   RA (rheumatoid arthritis) (HCC)   Hyperkalemia   Hyponatremia   Chronic atrial fibrillation (HCC)   Hypotension   Mild shock most likely of septic etiology with some gallbladder wall thickening.  Story somewhat suspicious for biliary colic but no Murphy sign now.  On chronic methotrexate and diabetic though.  Plan:  Her exam is not suspicious for cholecystitis but this could be easily mass given her recent pain medications, mild sedation, immunosuppression methotrexate, and diabetes.  I think it would be a good idea to get a  HIDA scan Nuclear medicine study.  If there is evidence of cholecystitis or cystic duct obstruction, she will need more definitive therapy.  I feel she is to have medically fragile this time to tolerate a cholecystectomy, would do percutaneous drainage.  Last Eliquis dose yesterday I believe.  Then can reconsider elective cholecystectomy 6 weeks later if medically tuned up and cleared by cardiology.  If nuclear medicine HIDA scan is negative, I am skeptical that gallbladder is the cause & recommend looking for a different source.  I do not see any other intra-abdominal pathology such as bowel obstruction or diverticulitis or perforation or ulcer.  No evidence of colitis.  The patient is stable.  There is no evidence of peritonitis, acute abdomen, nor shock.  There is no strong evidence of failure of improvement nor decline with current non-operative management.  There is no need for surgery at the present moment.  Dr  Rosenbower.   We will continue to follow.   -VTE prophylaxis- SCDs, etc -mobilize as tolerated to help recovery    Adin Hector, M.D., F.A.C.S. Gastrointestinal and Minimally Invasive Surgery [facility] [address] [place], [place] [address] [phone] Main / Paging   12/16/2015  Note: Portions of this report may have been transcribed using voice recognition software. Every effort was made to ensure accuracy; however, inadvertent computerized transcription errors may be present.   Any transcriptional errors that result from this process are unintentional.

## 2015-12-16 NOTE — Progress Notes (Signed)
PT Cancellation Note  Patient Details Name: Theresa Cortez MRN: [id number] DOB: [birth year]   Cancelled Treatment:    Reason Eval/Treat Not Completed: Patient not medically ready (await further consults for possible drain, currently with increased lactic acid. low BP)   Rada Hay 12/16/2015, 7:41 AM Blanchard Kelch PT [phone]

## 2015-12-16 NOTE — Progress Notes (Signed)
[facility]  Chart reviewed, CT abdomen with possible acute cholecystitis which could explain rising lactic acid. Discussed with [facility], they will consult surgery, I will order RUQ ultrasound.  May benefit from percutaneous drain as her surgical risk is high.  Daughter updated by phone.    Heber Hewlett, MD [place] [facility] Pager: [phone] Cell: [phone] After 3pm or if no response, call [phone]

## 2015-12-16 NOTE — Progress Notes (Addendum)
[facility] Progress Note   Theresa Cortez  OHY:[id number]  DOB: 1929/11/14  DOA: 12/15/2015 PCP: Gwenyth Bender, MD  Brief narrative: Theresa Cortez is a 82 y.o. 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 Last admission 3/13-3/21 for CHF She had an episode of jerking followed by unresponsiveness and then became alert in 1 min   Subjective: Evaluated in AM. Nausea resolved. No abdominal pain. Has a cough but this it mild.   Assessment/Plan: Principal Problem: Vomiting - with mildly elevated LFTs which are actually better than last admission - HIDA negative- start diet and follow  - stop antibiotics  Active Problems: Hyponatremia/ hyperkalemia/ AKI on CKD3/  lactic acidosis - due to diuretics and vomiting?- Cr worsened since admission- due to hypotension? - hold Lasix- kayexalate given- she had BMs- following labs closely - may need to decrease dose of Lasix on discharge  Uresponsive episode - possible seizure- not surprising with her electrolyte abnormalities- no further work up for now    Hypotension/   Chronic combined systolic and diastolic heart failure - improved with fluid boluses- hold Lasix today-  - EF 25-30% with severe MR- BPs were low during last admission and therefore she was not given ACE/ARB, Hydralazine/ Imdur  Cough -not new issue- had this problem when last discharged- she had 3 rounds of antibiotics for this as outpt- supportive care    Diabetes mellitus, type 2 - hold Januvia, low dose ISS while following PO intake     Hypothyroidism - synthroid    RA (rheumatoid arthritis) - Plaquenil - takes  Methotrexate on Fridays     Chronic atrial fibrillation - Eliquis + Amiodarone    Adnexal cyst - left - chronic    Protein-calorie malnutrition, moderate  - start diet and follow      Antibiotics: Anti-infectives    Start     Dose/Rate Route Frequency Ordered Stop   12/17/15 2200  levofloxacin (LEVAQUIN) IVPB 750 mg     750 mg 100 mL/hr over 90 Minutes Intravenous Every 48 hours 12/15/15 1952     12/16/15 2200  vancomycin (VANCOCIN) IVPB 750 mg/150 ml premix     750 mg 150 mL/hr over 60 Minutes Intravenous Every 24 hours 12/15/15 1959     12/16/15 0600  aztreonam (AZACTAM) 1 g in dextrose 5 % 50 mL IVPB     1 g 100 mL/hr over 30 Minutes Intravenous 3 times per day 12/15/15 1950     12/15/15 2200  hydroxychloroquine (PLAQUENIL) tablet 200 mg     200 mg Oral 2 times daily 12/15/15 2001     12/15/15 1945  levofloxacin (LEVAQUIN) IVPB 750 mg     750 mg 100 mL/hr over 90 Minutes Intravenous  Once 12/15/15 1935 12/16/15 0005   12/15/15 1945  aztreonam (AZACTAM) 2 g in dextrose 5 % 50 mL IVPB     2 g 100 mL/hr over 30 Minutes Intravenous  Once 12/15/15 1935 12/15/15 2056   12/15/15 1945  vancomycin (VANCOCIN) IVPB 1000 mg/200 mL premix     1,000 mg 200 mL/hr over 60 Minutes Intravenous  Once 12/15/15 1935 12/15/15 2129     Code Status:     Code Status Orders        Start  Ordered   12/15/15 2004  Full code   Continuous     12/15/15 2004    Code Status History    Date Active Date Inactive Code Status Order ID Comments User Context   11/22/2015 11:46 PM 11/30/2015  2:13 PM Full Code [id number]  Clydie Braun, MD ED    Advance Directive Documentation        Most Recent Value   Type of Advance Directive  Healthcare Power of Attorney   Pre-existing out of facility DNR order (yellow form or pink MOST form)     "MOST" Form in Place?       Family Communication: daughter at bedside Disposition Plan: return to SNF DVT prophylaxis: Eliquis Consultants: gen  surgery Procedures: see imaging below    Objective: [facility] Weights   12/15/15 2112  Weight: 83 kg (182 lb 15.7 oz)    Intake/Output Summary (Last 24 hours) at 12/16/15 1738 Last data filed at 12/16/15 1100  Gross per 24 hour  Intake 1498.33 ml  Output    100 ml  Net 1398.33 ml     Vitals Filed Vitals:   12/16/15 0600 12/16/15 0800 12/16/15 1414 12/16/15 1600  BP: 87/60  112/61 118/82  Pulse: 37   77  Temp:  98.1 F (36.7 C)    TempSrc:  Oral    Resp: 18   18  Height:      Weight:      SpO2: 85%   100%    Exam:  General:  Pt is alert, not in acute distress  HEENT: No icterus, No thrush, oral mucosa moist  Cardiovascular: regular rate and rhythm, S1/S2 No murmur  Respiratory: clear to auscultation bilaterally   Abdomen: Soft, +Bowel sounds, non tender, non distended, no guarding  MSK: No cyanosis or clubbing- no pedal edema   Data Reviewed: Basic Metabolic Panel:  Recent Labs Lab 12/12/15 2116 12/15/15 1632 12/16/15 0921  NA 124* 125* 130*  K 5.3* 6.1* 5.7*  CL 89* 92* 95*  CO2 25 22 20*  GLUCOSE 145* 122* 101*  BUN 15 28* 34*  CREATININE 1.71* 2.19* 2.32*  CALCIUM 8.8* 8.9 8.6*   Liver Function Tests:  Recent Labs Lab 12/12/15 2116 12/15/15 1632 12/16/15 0921  AST 35 99* 110*  ALT 34 75* 91*  ALKPHOS 110 299* 232*  BILITOT 1.0 1.2 1.6*  PROT 6.7 7.3 6.1*  ALBUMIN 2.6* 3.0* 2.6*    Recent Labs Lab 12/15/15 1644  LIPASE 27   No results for input(s): AMMONIA in the last 168 hours. CBC:  Recent Labs Lab 12/12/15 2116 12/15/15 1632 12/16/15 0921  WBC 7.2 7.3 7.7  NEUTROABS 5.1 5.6  --   HGB 10.2* 11.2* 10.1*  HCT 30.5* 33.1* 29.9*  MCV 95.0 93.0 94.3  PLT 310 306 288   Cardiac Enzymes:  Recent Labs Lab 12/15/15 1632  TROPONINI <0.03   BNP (last 3 results)  Recent Labs  11/22/15 2052 12/16/15 0113  BNP 1732.2* 1426.5*    ProBNP (last 3 results) No results for input(s): PROBNP in the last 8760  hours.  CBG:  Recent Labs Lab 12/16/15 0822  GLUCAP 91    Recent Results (from the past 240 hour(s))  Culture, blood (routine x 2)     Status: None (Preliminary result)   Collection Time: 12/15/15  4:48 PM  Result Value Ref Range Status   Specimen Description BLOOD RIGHT ARM  Final   Special Requests BOTTLES DRAWN AEROBIC AND ANAEROBIC 5CC  Final  Culture   Final    NO GROWTH < 24 HOURS Performed at [facility]    Report Status PENDING  Incomplete  MRSA PCR Screening     Status: None   Collection Time: 12/15/15 10:12 PM  Result Value Ref Range Status   MRSA by PCR NEGATIVE NEGATIVE Final    Comment:        The GeneXpert MRSA Assay (FDA approved for NASAL specimens only), is one component of a comprehensive MRSA colonization surveillance program. It is not intended to diagnose MRSA infection nor to guide or monitor treatment for MRSA infections.   Culture, sputum-assessment     Status: None   Collection Time: 12/16/15  4:08 AM  Result Value Ref Range Status   Specimen Description SPUTUM  Final   Special Requests NONE  Final   Sputum evaluation   Final    THIS SPECIMEN IS ACCEPTABLE. RESPIRATORY CULTURE REPORT TO FOLLOW.   Report Status 12/16/2015 FINAL  Final     Studies: Ct Abdomen Pelvis Wo Contrast  12/16/2015  CLINICAL DATA:  Right mid abdominal pain for 3 days. Nausea and vomiting. EXAM: CT ABDOMEN AND PELVIS WITHOUT CONTRAST TECHNIQUE: Multidetector CT imaging of the abdomen and pelvis was performed following the standard protocol without IV contrast. COMPARISON:  CT 4 days prior 12/12/2015 FINDINGS: Lower chest: Stable cardiomegaly. Bibasilar bronchiectasis, bibasilar opacities, and small pleural effusions, unchanged from prior exam. Liver: No focal lesion allowing for lack contrast. Hepatobiliary: High-density material within the gallbladder, likely combination of sludge and stones. Question of gallbladder wall thickening and pericholecystic soft tissue  stranding. No evidence of biliary dilatation. Pancreas: No ductal dilatation or inflammation. Suboptimal pancreatic assessment given lack contrast. Spleen: Normal. Adrenal glands: Bilateral thickening, no nodule. Kidneys: Bilateral perinephric soft tissue stranding, new from prior. No hydronephrosis. Stomach/Bowel: No abnormal gastric distention, small hiatal hernia. Limited bowel assessment given lack contrast. There are no dilated or thickened small bowel loops. Enteric chain sutures noted in the left abdomen. No evidence of bowel obstruction. Moderate stool burden. The appendix is not visualized. Vascular/Lymphatic: No retroperitoneal adenopathy. Abdominal aorta is normal in caliber. Moderate atherosclerosis of the abdominal aorta without aneurysm. Reproductive: Left ovarian/adnexal cyst measures 2.6 cm, unchanged from recent prior. Uterus remains in situ. Right ovary is not well seen. Bladder: Physiologically distended. Other: Development of small volume free fluid in both pericolic gutters and in the pelvis. Mild mesenteric edema. No free air. Musculoskeletal: There are no acute or suspicious osseous abnormalities. Degenerative change in the lumbar spine. IMPRESSION: 1. High-density material in the gallbladder, likely combination of small stones and sludge. Questionable gallbladder wall thickening and pericholecystic soft tissue stranding. Given right-sided abdominal pain, acute cholecystitis is considered. Further evaluation with right upper quadrant ultrasound recommended. 2. Development of small volume free fluid in the abdomen and pelvis. Mesenteric and perinephric edema. Findings may be related to hydration status. Correlation with urinalysis recommended to exclude urinary tract infection. 3. Left adnexal cyst measures 2.6 cm, this is unchanged from recent prior, however abnormal in a postmenopausal patient of this age. Nonemergent sonographic follow-up is recommended. 4. Additional chronic findings are  stable. Electronically Signed   By: Rubye Oaks M.D.   On: 12/16/2015 04:15   Dg Chest 2 View  12/15/2015  CLINICAL DATA:  Weakness and shortness of Breath EXAM: CHEST  2 VIEW COMPARISON:  12/12/2015 FINDINGS: Cardiac shadow remains enlarged. The lungs are again well aerated. Some increasing density is noted in the right upper lobe which may  represent some early infiltrate. Chronic interstitial changes are noted. No sizable effusion is seen. No acute bony abnormality is noted. IMPRESSION: Increased density in the right upper lobe which may represent acute on chronic infiltrate. Electronically Signed   By: Alcide Clever M.D.   On: 12/15/2015 17:08   Nm Hepatobiliary Including Gb  12/16/2015  CLINICAL DATA:  Right upper quadrant abdominal pain for 1 month. EXAM: NUCLEAR MEDICINE HEPATOBILIARY IMAGING TECHNIQUE: Sequential images of the abdomen were obtained out to 60 minutes following intravenous administration of radiopharmaceutical. RADIOPHARMACEUTICALS:  5.2 mCi Tc-39m  Choletec IV COMPARISON:  CT scan of December 12, 2015. FINDINGS: Prompt uptake and biliary excretion of activity by the liver is seen. Gallbladder activity is visualized, consistent with patency of cystic duct. Biliary activity passes into small bowel, consistent with patent common bile duct. IMPRESSION: Normal uptake is seen within the gallbladder.  No abnormality seen. Electronically Signed   By: Lupita Raider, M.D.   On: 12/16/2015 13:09   US Abdomen Limited Ruq  12/16/2015  CLINICAL DATA:  Abdominal pain. EXAM: US ABDOMEN LIMITED - RIGHT UPPER QUADRANT COMPARISON:  CT 12/16/2015 . FINDINGS: Gallbladder: No gallstones. Gallbladder wall is thickened up to 3.8 mm. Pericholecystic fluid noted. Negative Murphy sign. Common bile duct: Diameter: 1.7 mm Liver: No focal lesion identified. Within normal limits in parenchymal echogenicity. Small right pleural effusion. IMPRESSION: 1. No gallstones noted. 2. Gallbladder wall thickening up to 3.8 mm  noted. This can be seen with cholecystitis and or hypoproteinemia. Pericholecystic fluid noted. Negative Murphy sign. 3.  Small right pleural effusion. Electronically Signed   By: Maisie Fus  Register   On: 12/16/2015 08:20    Scheduled Meds:  Scheduled Meds: . amiodarone  200 mg Oral BID  . atorvastatin  10 mg Oral Daily  . aztreonam  1 g Intravenous 3 times per day  . folic acid  1 mg Oral Daily  . hydrocortisone sodium succinate  50 mg Intravenous Q6H  . hydroxychloroquine  200 mg Oral BID  . insulin aspart  0-5 Units Subcutaneous QHS  . insulin aspart  0-9 Units Subcutaneous TID WC  . latanoprost  1 drop Both Eyes QHS  . [START ON 12/17/2015] levofloxacin (LEVAQUIN) IV  750 mg Intravenous Q48H  . levothyroxine  75 mcg Oral QAC breakfast  . vancomycin  750 mg Intravenous Q24H   Continuous Infusions: . sodium chloride 75 mL/hr at 12/16/15 1535    Time spent on care of this patient: 35  min   Chrystle Murillo, MD 12/16/2015, 5:38 PM  LOS: 1 day   [facility] Office  [phone] Pager - Text Page per [email or web address] If 7PM-7AM, please contact night-coverage [email or web address]

## 2015-12-16 NOTE — Progress Notes (Signed)
CRITICAL VALUE ALERT  Critical value received:  Lactic acid 4.0  Date of notification:  12/16/2015  Time of notification:  0200  Critical value read back:Yes.    Nurse who received alert:  Loletta Parish RN  MD notified (1st page):  Dr. Dema Severin    Time of first page:  0230  MD notified (2nd page):  Time of second page:  Responding MD:  Dr. Dema Severin  Time MD responded:  0230

## 2015-12-16 NOTE — Progress Notes (Signed)
eLink Physician-Brief Progress Note Patient Name: Theresa Cortez DOB: [birth year] MRN: [id number]   Date of Service  12/16/2015  HPI/Events of Note  Lactic acid = 4.0, essentially unchanged from earlier  eICU Interventions  Give slow NS 250cc bolus, over 2 hours.      Intervention Category Major Interventions: Sepsis - evaluation and management  Arneisha Kincannon 12/16/2015, 2:47 AM

## 2015-12-16 NOTE — Progress Notes (Signed)
pts lactic acid 3.8  k 5.7 both improved. Notified md. Await orders.  Pt in radiology at this time

## 2015-12-16 NOTE — Progress Notes (Signed)
At 0000, RN in pt's room giving medications. Pt had sudden jerking motion, and became unresponsive.  Not arousbale to voice or sternal rub. Within seconds, pt began vomiting. 02 stats dropped to 78%. Oral suctioning was performed, and oxygen increased to 6L [place].  After approximately 1 minute, pt became alert and 02 stats returned to baseline of 98%.  Pt able to answer questions and was oriented x3. Dr. Clyde Lundborg notified and seizure precautions initiated. Pt currently alert and oriented x3, 02 stats 100% on 3L [place]. Will continue to monitor.

## 2015-12-16 NOTE — Progress Notes (Signed)
OT Cancellation Note  Patient Details Name: Theresa Cortez MRN: [id number] DOB: [birth year]   Cancelled Treatment:    Reason Eval/Treat Not Completed: Other (comment).  Noted pt being worked up for possible acute cholecystitis.  Pt had been unresponsive after midnight and has HR of 37.  Will check back later.  Lonie Rummell 12/16/2015, 7:40 AM  Marica Otter, OTR/L [phone] 12/16/2015

## 2015-12-16 NOTE — Progress Notes (Signed)
PULMONARY / CRITICAL CARE MEDICINE   Name: Theresa Cortez MRN: [id number] DOB: 1930/06/30    ADMISSION DATE:  12/15/2015 CONSULTATION DATE:  12/15/2015  REFERRING MD:  Dr. Clyde Lundborg with [facility]  CHIEF COMPLAINT:  Right leg pain, nausea and vomiting   SUBJECTIVE: Pt reports abdominal discomfort in RL abd and dry cough.    VITAL SIGNS: BP 87/60 mmHg  Pulse 37  Temp(Src) 97.6 F (36.4 C) (Oral)  Resp 18  Ht 5\' 6"  (1.676 m)  Wt 182 lb 15.7 oz (83 kg)  BMI 29.55 kg/m2  SpO2 85%  HEMODYNAMICS:    VENTILATOR SETTINGS:    INTAKE / OUTPUT: I/O last 3 completed shifts: In: 1198.3 [I.V.:898.3; IV Piggyback:300] Out: -   PHYSICAL EXAMINATION: General:  Chronically ill appearing but comfortable in bed Neuro:  Awake alert, oriented 4 HEENT:  Normocephalic atraumatic, oropharynx clear Cardiovascular:  Irregularly irregular, systolic murmur right upper sternal border most prominent, there is a second systolic murmur which is about 2 out of 6 in the left lower sternal border  Lungs:  Non-labored on [place] O2, few faint crackles RUL, otherwise clear  Abdomen:  Bowel sounds are positive, there are no masses, mild tenderness in the right lower quadrant but no guarding or rebound tenderness Musculoskeletal:  Normal bulk and tone Skin:  No pretibial edema on my exam, extremities are warm and well perfused, no cyanosis Pulse: peripheral pulses are intact  LABS:  BMET  Recent Labs Lab 12/12/15 2116 12/15/15 1632  NA 124* 125*  K 5.3* 6.1*  CL 89* 92*  CO2 25 22  BUN 15 28*  CREATININE 1.71* 2.19*  GLUCOSE 145* 122*    Electrolytes  Recent Labs Lab 12/12/15 2116 12/15/15 1632  CALCIUM 8.8* 8.9    CBC  Recent Labs Lab 12/12/15 2116 12/15/15 1632  WBC 7.2 7.3  HGB 10.2* 11.2*  HCT 30.5* 33.1*  PLT 310 306    Coag's  Recent Labs Lab 12/15/15 1644 12/15/15 2205  APTT  --  33  INR 1.76*  --     Sepsis Markers  Recent Labs Lab 12/15/15 1659  12/15/15 2135 12/15/15 2205 12/16/15 0113  LATICACIDVEN 2.78* 3.9*  --  4.0*  PROCALCITON  --   --  0.25  --     ABG No results for input(s): PHART, PCO2ART, PO2ART in the last 168 hours.  Liver Enzymes  Recent Labs Lab 12/12/15 2116 12/15/15 1632  AST 35 99*  ALT 34 75*  ALKPHOS 110 299*  BILITOT 1.0 1.2  ALBUMIN 2.6* 3.0*    Cardiac Enzymes  Recent Labs Lab 12/15/15 1632  TROPONINI <0.03    Glucose No results for input(s): GLUCAP in the last 168 hours.  Imaging Ct Abdomen Pelvis Wo Contrast  12/16/2015  CLINICAL DATA:  Right mid abdominal pain for 3 days. Nausea and vomiting. EXAM: CT ABDOMEN AND PELVIS WITHOUT CONTRAST TECHNIQUE: Multidetector CT imaging of the abdomen and pelvis was performed following the standard protocol without IV contrast. COMPARISON:  CT 4 days prior 12/12/2015 FINDINGS: Lower chest: Stable cardiomegaly. Bibasilar bronchiectasis, bibasilar opacities, and small pleural effusions, unchanged from prior exam. Liver: No focal lesion allowing for lack contrast. Hepatobiliary: High-density material within the gallbladder, likely combination of sludge and stones. Question of gallbladder wall thickening and pericholecystic soft tissue stranding. No evidence of biliary dilatation. Pancreas: No ductal dilatation or inflammation. Suboptimal pancreatic assessment given lack contrast. Spleen: Normal. Adrenal glands: Bilateral thickening, no nodule. Kidneys: Bilateral perinephric soft tissue stranding, new  from prior. No hydronephrosis. Stomach/Bowel: No abnormal gastric distention, small hiatal hernia. Limited bowel assessment given lack contrast. There are no dilated or thickened small bowel loops. Enteric chain sutures noted in the left abdomen. No evidence of bowel obstruction. Moderate stool burden. The appendix is not visualized. Vascular/Lymphatic: No retroperitoneal adenopathy. Abdominal aorta is normal in caliber. Moderate atherosclerosis of the abdominal  aorta without aneurysm. Reproductive: Left ovarian/adnexal cyst measures 2.6 cm, unchanged from recent prior. Uterus remains in situ. Right ovary is not well seen. Bladder: Physiologically distended. Other: Development of small volume free fluid in both pericolic gutters and in the pelvis. Mild mesenteric edema. No free air. Musculoskeletal: There are no acute or suspicious osseous abnormalities. Degenerative change in the lumbar spine. IMPRESSION: 1. High-density material in the gallbladder, likely combination of small stones and sludge. Questionable gallbladder wall thickening and pericholecystic soft tissue stranding. Given right-sided abdominal pain, acute cholecystitis is considered. Further evaluation with right upper quadrant ultrasound recommended. 2. Development of small volume free fluid in the abdomen and pelvis. Mesenteric and perinephric edema. Findings may be related to hydration status. Correlation with urinalysis recommended to exclude urinary tract infection. 3. Left adnexal cyst measures 2.6 cm, this is unchanged from recent prior, however abnormal in a postmenopausal patient of this age. Nonemergent sonographic follow-up is recommended. 4. Additional chronic findings are stable. Electronically Signed   By: Rubye Oaks M.D.   On: 12/16/2015 04:15   Dg Chest 2 View  12/15/2015  CLINICAL DATA:  Weakness and shortness of Breath EXAM: CHEST  2 VIEW COMPARISON:  12/12/2015 FINDINGS: Cardiac shadow remains enlarged. The lungs are again well aerated. Some increasing density is noted in the right upper lobe which may represent some early infiltrate. Chronic interstitial changes are noted. No sizable effusion is seen. No acute bony abnormality is noted. IMPRESSION: Increased density in the right upper lobe which may represent acute on chronic infiltrate. Electronically Signed   By: Alcide Clever M.D.   On: 12/15/2015 17:08   US Abdomen Limited Ruq  12/16/2015  CLINICAL DATA:  Abdominal pain. EXAM:  US ABDOMEN LIMITED - RIGHT UPPER QUADRANT COMPARISON:  CT 12/16/2015 . FINDINGS: Gallbladder: No gallstones. Gallbladder wall is thickened up to 3.8 mm. Pericholecystic fluid noted. Negative Murphy sign. Common bile duct: Diameter: 1.7 mm Liver: No focal lesion identified. Within normal limits in parenchymal echogenicity. Small right pleural effusion. IMPRESSION: 1. No gallstones noted. 2. Gallbladder wall thickening up to 3.8 mm noted. This can be seen with cholecystitis and or hypoproteinemia. Pericholecystic fluid noted. Negative Murphy sign. 3.  Small right pleural effusion. Electronically Signed   By: Maisie Fus  Register   On: 12/16/2015 08:20     STUDIES:  4/05  CT abdomen pelvis >> high-density material in gallbladder, likely combination of small stones and sludge, ? Gallbladder wall thickening and pericholecystic soft tissue stranding, small volume of free fluid in abd/pelvis, adrenal cyst 2.6cm 4/05  HIDA Scan >>  4/05  ABD [place] >>   CULTURES: BCx2 4/5 >>  UC 4/5 >>   ANTIBIOTICS: Vancomycin 4/5 >>  Aztreonam 4/5 >>  Levaquin 4/5 >>   SIGNIFICANT EVENTS: 4/05  admitted  LINES/TUBES: None  DISCUSSION: Pleasant 82 year old female who has rheumatoid arthritis and newly diagnosed biventricular failure with severe tricuspid regurgitation as well as severe mitral regurgitation who is being admitted to [facility] long hospital on 12/15/2015 in the setting of nausea vomiting, poor by mouth intake, and ongoing diuretic use. Objectively, she has acute on chronic kidney failure,  hyponatremia, chronic-appearing hypotension, and a mildly elevated lactic acidosis. She is complaining of symptoms which could be consistent with a viral gastroenteritis, CT abd concerning for acute cholecystitis. She does not appear to be in septic shock as her blood pressure is actually at her most recent baseline (blood pressure and her cardiology office visit on March 27 was 82/50).  Her symptoms are most likely explained  by overdiuresis as well as ongoing volume losses from nausea/vomiting in the setting of possible acute choleycystitis.  We would actually harm her if we treated her with aggressive fluid boluses or inotrope medications, at this time I think the best approach is gentle IV fluids, holding diuretic medicines and abx. There may also be a component of adrenal insufficiency considering the adrenal hyperplasia seen on her CT, likely chronic prednisone use, and hyponatremia with hyperkalemia.  CCS requesting HIDA scan and possible perc drain pending results.    ASSESSMENT / PLAN:  PULMONARY A: Bronchiectasis Right upper lobe infiltrate, doubt healthcare associated pneumonia, question related to severe tricuspid regurgitation P:   Titrate O2 as needed for O2 saturation greater than 90% Pulmonary hygiene - IS, mobilize as able   CARDIOVASCULAR A:  Hypotension - currently at her baseline, 80's/50's Mild lactic acidosis - multifactorial in setting of hypotension and renal failure Atrial fibrillation Congestive heart failure - systolic with LVEF 25% Secondary pulmonary hypertension in setting of severe mitral regurgitation - right heart catheterization March 2017 with no capillary wedge pressure of 28 Severe tricuspid regurgitation Severe mitral regurgitation P:  Gentle fluids, NS @ 75 Trend lactic acid  If develops evidence of shock, then consider central access and inotropes, but at this time they would be more detrimental than helpful Hold diuretic medicines Telemetry monitoring  RENAL A:   Acute on chronic kidney failure secondary to overdiuresis and following losses from nausea and vomiting Elevated Lactate - suspect component of poor clearance  Hyponatremia secondary to volume loss from over diuresis Hyperkalemia  P:   Gentle IV fluids, NS @ 66ml/hr Monitor BMET and UOP Replace electrolytes as needed Repeat labs this am, CMP & lactate now  GASTROINTESTINAL A:   Possible Acute  Cholecystitis  Nausea and vomiting - concern for acute cholecystitis on CT  Right upper and lower quadrant pain P:   CT abd as above CCS consulted, pending HIDA scan. Feel she is not a candidate for surgery at this time, rec's perc drain if needed Monitor lactic acid  Await abd [place]  HEMATOLOGIC A:   Coagulopathy - on Eliquis prior to admit for AF Mild Anemia  P:  Monitor for bleeding Trend CBC  Hold anticoagulation for possible procedures SCD's for DVT prophylaxis   INFECTIOUS A:   Suspected acute cholecystitis  P:   Agree with antibiotics Await HIDA scan, Abd [place]  Follow cultures as above  ENDOCRINE A:   Adrenal Insufficiency - ruled out, lab collected prior to meds (confirmed) Hypothyroidism  P:   Discontinue hydrocortisone after two more doses  Continue synthroid   NEUROLOGIC A:   Pain - in setting of possible acute cholecystitis  ? Seizure Activity vs Vasovagal with vomiting episode after ingestion of kayexalate, n/v P:   PRN morphine for pain  Monitor for further seizure activity   FAMILY  - Updates:  Daughters updated at bedside.  Pending further information, will update the family again.      Canary Brim, NP-C [facility] Pgr: [phone] or if no answer [phone] 12/16/2015, 8:29 AM  PCCM Attending Note: Patient seen & examined with nurse practitioner. Please refer to her progress note which I have reviewed in detail.   BP 87/60 mmHg  Pulse 37  Temp(Src) 98.1 F (36.7 C) (Oral)  Resp 18  Ht 5\' 6"  (1.676 m)  Wt 182 lb 15.7 oz (83 kg)  BMI 29.55 kg/m2  SpO2 85% General: awake, oriented x 3, comfortable, not in distress. BP now was 117/70, HR 78, RR 20s (4/6 4pm). 98% on 2L [place] Integument: intact skin. Skin was warm and dry. (-) rashes. Traced edema.  Cardiovascular: good s1/s2. (-) s3/m/r/g Pulmonary: fair ae. Bibasilar crackles. (-) rhonchi/wheeze Abdomen: (+) BS, soft, NT. (-) masses.   CBC Latest Ref Rng 12/16/2015 12/15/2015  12/12/2015  WBC 4.0 - 10.5 K/uL 7.7 7.3 7.2  Hemoglobin 12.0 - 15.0 g/dL 10.1(L) 11.2(L) 10.2(L)  Hematocrit 36.0 - 46.0 % 29.9(L) 33.1(L) 30.5(L)  Platelets 150 - 400 K/uL 288 306 310    BMP Latest Ref Rng 12/16/2015 12/15/2015 12/12/2015  Glucose 65 - 99 mg/dL 02/11/2016) 149(F) 026(V)  BUN 6 - 20 mg/dL 785(Y) 85(O) 15  Creatinine 0.44 - 1.00 mg/dL 27(X) 4.12(I) 7.86(V)  Sodium 135 - 145 mmol/L 130(L) 125(L) 124(L)  Potassium 3.5 - 5.1 mmol/L 5.7(H) 6.1(HH) 5.3(H)  Chloride 101 - 111 mmol/L 95(L) 92(L) 89(L)  CO2 22 - 32 mmol/L 20(L) 22 25  Calcium 8.9 - 10.3 mg/dL 6.72(C) 8.9 9.4(B)    A/P: Hypotension, abd pain, lactic acidosis, nausea/vomiting, with abd ct scan findings of :  High-density material in the gallbladder, likely combination of small stones and sludge. Questionable gallbladder wall thickening and pericholecystic soft tissue stranding.  HIDA was was normal.  Pt likely with sepsis from abd (GB)  infection but she has improved the last 24 hrs. BP is at baseline. Pt with underlying CHF with EF 25%, with pulm HTN and severe MR, TR. No mental status changes. Making urine.   Suggest : 1. De escalate abx once with cultures.  2. D/c IVF before she gets congested.  3. Diet as tolerated. 4. Cont other meds.  5. IS, spirometry. 6. WOF worsening resp status. Doubt she has PNA at this point. 7. anticipate she can be transferred out of ICU in am if she cont to improve. PCCM will sign off for now. Call back if with questions. Thanks!   J. 0.9(G, MD 12/16/2015, 4:28 PM [facility] Pager [phone] After 3 pm or if no answer, call [phone]

## 2015-12-17 ENCOUNTER — Inpatient Hospital Stay (HOSPITAL_COMMUNITY): Payer: Medicare Other

## 2015-12-17 DIAGNOSIS — I482 Chronic atrial fibrillation: Secondary | ICD-10-CM

## 2015-12-17 DIAGNOSIS — I952 Hypotension due to drugs: Secondary | ICD-10-CM

## 2015-12-17 LAB — BASIC METABOLIC PANEL
Anion gap: 10 (ref 5–15)
Anion gap: 12 (ref 5–15)
BUN: 35 mg/dL — AB (ref 6–20)
BUN: 39 mg/dL — ABNORMAL HIGH (ref 6–20)
CALCIUM: 8 mg/dL — AB (ref 8.9–10.3)
CHLORIDE: 97 mmol/L — AB (ref 101–111)
CHLORIDE: 100 mmol/L — AB (ref 101–111)
CO2: 19 mmol/L — AB (ref 22–32)
CO2: 16 mmol/L — AB (ref 22–32)
CREATININE: 2.17 mg/dL — AB (ref 0.44–1.00)
CREATININE: 2.2 mg/dL — AB (ref 0.44–1.00)
Calcium: 8.3 mg/dL — ABNORMAL LOW (ref 8.9–10.3)
GFR calc Af Amer: 23 mL/min — ABNORMAL LOW (ref >60)
GFR calc non Af Amer: 20 mL/min — ABNORMAL LOW (ref >60)
GFR calc non Af Amer: 20 mL/min — ABNORMAL LOW (ref >60)
GFR, EST AFRICAN AMERICAN: 23 mL/min — AB (ref >60)
Glucose, Bld: 108 mg/dL — ABNORMAL HIGH (ref 65–99)
Glucose, Bld: 130 mg/dL — ABNORMAL HIGH (ref 65–99)
POTASSIUM: 5.4 mmol/L — AB (ref 3.5–5.1)
Potassium: 5.1 mmol/L (ref 3.5–5.1)
SODIUM: 128 mmol/L — AB (ref 135–145)
Sodium: 126 mmol/L — ABNORMAL LOW (ref 135–145)

## 2015-12-17 LAB — MAGNESIUM: Magnesium: 1.9 mg/dL (ref 1.7–2.4)

## 2015-12-17 LAB — GLUCOSE, CAPILLARY
GLUCOSE-CAPILLARY: 131 mg/dL — AB (ref 65–99)
GLUCOSE-CAPILLARY: 130 mg/dL — AB (ref 65–99)
Glucose-Capillary: 81 mg/dL (ref 65–99)
Glucose-Capillary: 119 mg/dL — ABNORMAL HIGH (ref 65–99)

## 2015-12-17 LAB — LEGIONELLA PNEUMOPHILA SEROGP 1 UR AG: L. PNEUMOPHILA SEROGP 1 UR AG: NEGATIVE

## 2015-12-17 LAB — CBC
HEMATOCRIT: 28.4 % — AB (ref 36.0–46.0)
Hemoglobin: 9.7 g/dL — ABNORMAL LOW (ref 12.0–15.0)
MCH: 32 pg (ref 26.0–34.0)
MCHC: 34.2 g/dL (ref 30.0–36.0)
MCV: 93.7 fL (ref 78.0–100.0)
PLATELETS: 269 10*3/uL (ref 150–400)
RBC: 3.03 MIL/uL — ABNORMAL LOW (ref 3.87–5.11)
RDW: 17.8 % — AB (ref 11.5–15.5)
WBC: 7.7 10*3/uL (ref 4.0–10.5)

## 2015-12-17 LAB — URINE CULTURE: CULTURE: NO GROWTH

## 2015-12-17 LAB — HIV ANTIBODY (ROUTINE TESTING W REFLEX): HIV Screen 4th Generation wRfx: NONREACTIVE

## 2015-12-17 LAB — UREA NITROGEN, URINE: Urea Nitrogen, Ur: 405 mg/dL

## 2015-12-17 MED ORDER — BENZONATATE 100 MG PO CAPS
200.0000 mg | ORAL_CAPSULE | Freq: Three times a day (TID) | ORAL | Status: DC | PRN
  Administered 2015-12-17: 200 mg via ORAL
  Filled 2015-12-17 (×2): qty 2

## 2015-12-17 NOTE — Evaluation (Signed)
Physical Therapy Evaluation Patient Details Name: Theresa Cortez MRN: [id number] DOB: 1930/06/22 Today's Date: 12/17/2015   History of Present Illness  This 82 year old female was admitted from SNF where she was undergoing rehab from last admission with cough, nausea/vomiting and abdominal pain.  She has a PMH of HTN, DM, CHF with EF of 25-30%, A-Fib, CKD, RD, DJD  Clinical Impression  Pt admitted with above diagnosis. Pt currently with functional limitations due to the deficits listed below (see PT Problem List).  Pt will benefit from skilled PT to increase their independence and safety with mobility to allow discharge to the venue listed below.   Would benefit from SNF post acute, pt is quite deconditioned at this time despite recent rehab stay; if home,  will need HHPT     Follow Up Recommendations SNF;Supervision/Assistance - 24 hour    Equipment Recommendations       Recommendations for Other Services       Precautions / Restrictions Precautions Precautions: Fall Restrictions Weight Bearing Restrictions: No      Mobility  Bed Mobility Overal bed mobility: Needs Assistance Bed Mobility: Supine to Sit     Supine to sit: Min assist;+2 for physical assistance;+2 for safety/equipment     General bed mobility comments: assistance for legs and light assistance for trunk.  HOB was raised  Transfers Overall transfer level: Needs assistance Equipment used: Rolling walker (2 wheeled) Transfers: Sit to/from Stand Sit to Stand: Mod assist;+2 physical assistance;+2 safety/equipment Stand pivot transfers: Mod assist;+2 safety/equipment;+2 physical assistance       General transfer comment: assistance to rise and stabilize, control descent.    Ambulation/Gait Ambulation/Gait assistance: +2 physical assistance;Min assist Ambulation Distance (Feet): 3 Feet (pivotal steps to chair) Assistive device: Rolling walker (2 wheeled) Gait Pattern/deviations: Step-through  pattern;Decreased stride length;Trunk flexed;Shuffle     General Gait Details: cues for RW safety, VSS during mobility  Stairs            Wheelchair Mobility    Modified Rankin (Stroke Patients Only)       Balance Overall balance assessment: Needs assistance   Sitting balance-Leahy Scale: Fair       Standing balance-Leahy Scale: Poor Standing balance comment: reliant on UEs                              Pertinent Vitals/Pain Pain Assessment: No/denies pain    Home Living Family/patient expects to be discharged to:: Skilled nursing facility Living Arrangements: Children Available Help at Discharge: Family;Available 24 hours/day Type of Home: House Home Access: Stairs to enter Entrance Stairs-Rails: Right Entrance Stairs-Number of Steps: 3 Home Layout: Two level;Able to live on main level with bedroom/bathroom Home Equipment: Walker - 4 wheels Additional Comments: pt was at [facility] for ST rehab following last admission    Prior Function Level of Independence: Needs assistance               Hand Dominance        Extremity/Trunk Assessment   Upper Extremity Assessment: Defer to OT evaluation;Generalized weakness           Lower Extremity Assessment: Generalized weakness RLE Deficits / Details: has had multiple knee replacements on right side and knee with decreased knee flexion, weaker on R than left ~3 to 3+/5       Communication   Communication: No difficulties  Cognition Arousal/Alertness: Awake/alert Behavior During Therapy: WFL for tasks assessed/performed Overall Cognitive  Status: Within Functional Limits for tasks assessed                      General Comments      Exercises        Assessment/Plan    PT Assessment Patient needs continued PT services  PT Diagnosis Difficulty walking;Generalized weakness   PT Problem List Decreased strength;Decreased range of motion;Decreased activity  tolerance;Decreased balance;Decreased mobility  PT Treatment Interventions DME instruction;Gait training;Functional mobility training;Therapeutic activities;Therapeutic exercise;Balance training;Patient/family education   PT Goals (Current goals can be found in the Care Plan section) Acute Rehab PT Goals Patient Stated Goal: get stronger PT Goal Formulation: With patient/family Time For Goal Achievement: 12/25/15 Potential to Achieve Goals: Good    Frequency Min 3X/week   Barriers to discharge        Co-evaluation   Reason for Co-Treatment: For patient/therapist safety PT goals addressed during session: Mobility/safety with mobility OT goals addressed during session: ADL's and self-care       End of Session Equipment Utilized During Treatment: Gait belt Activity Tolerance: Patient limited by fatigue;Patient tolerated treatment well Patient left: in chair;with call bell/phone within reach;with chair alarm set;with family/visitor present           Time: 9373-4287 PT Time Calculation (min) (ACUTE ONLY): 13 min   Charges:   PT Evaluation $PT Eval Moderate Complexity: 1 Procedure     PT G Codes:        Oren Barella Jan 02, 2016, 11:36 AM

## 2015-12-17 NOTE — Progress Notes (Signed)
CSW confirmed with patient's daughter, Misty Stanley that patient plans to return to [facility] when discharged. Patient is paying to hold a bed at [facility].   CSW confirmed with Wille Celeste at [facility] that they would be able to take patient over the weekend if ready.   Please call weekend CSW (ph#: [phone]) to facilitate discharge.      Lincoln Maxin, LCSW [facility] Clinical Social Worker cell #: [phone]

## 2015-12-17 NOTE — Progress Notes (Addendum)
[facility] Progress Note   Theresa Cortez  PIR:[id number]  DOB: [birth year]  DOA: 12/15/2015 PCP: Gwenyth Bender, MD  Brief narrative: Theresa Cortez is a 82 y.o. 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 Last admission 3/13-3/21 for CHF She had an episode of jerking followed by unresponsiveness and then became alert in 1 min   Subjective: Evaluated in AM. Eating second meal without trouble. C/o nagging cough.   Assessment/Plan: Principal Problem: Vomiting - with mildly elevated LFTs which are actually better than last admission - HIDA negative- started diet and doing well - stopped antibiotics 4/6  Active Problems: Hyponatremia/ hyperkalemia/ AKI on CKD3/  lactic acidosis - due to diuretics and vomiting?- Cr worsened since admission- due to hypotension? - hold Lasix- kayexalate given- she had BMs with resolution of hyperkalemia - may need to decrease dose of Lasix on discharge- cardiology recommends 20 mg daily - for now continue to hydrate slowly and follow serial sodiums - hold Januvia (AKI is side effect)  Uresponsive episode - possible seizure- not surprising with her electrolyte abnormalities- no further work up for now    Hypotension/   Chronic combined systolic and diastolic heart failure - improved with fluid boluses- hold Lasix today-  - EF 25-30% with severe MR- BPs were low during last admission and therefore she was not given ACE/ARB, Hydralazine/ Imdur  Cough -not new issue- had this problem when last discharged- she had 3 rounds of antibiotics for this as outpt-no post nasal drip-  supportive care    Diabetes  mellitus, type 2 - hold Januvia, low dose ISS while following PO intake     Hypothyroidism - synthroid    RA (rheumatoid arthritis) - Plaquenil - takes Methotrexate on Fridays - hold due to elevated LFTs    Chronic atrial fibrillation - Eliquis + Amiodarone- cardiology has stopped Amiodarone today due to elevated LFTs    Adnexal cyst - left - chronic    Protein-calorie malnutrition, moderate  - start diet and follow      Antibiotics: Anti-infectives    Start     Dose/Rate Route Frequency Ordered Stop   12/17/15 2200  levofloxacin (LEVAQUIN) IVPB 750 mg  Status:  Discontinued     750 mg 100 mL/hr over 90 Minutes Intravenous Every 48 hours 12/15/15 1952 12/16/15 1742   12/16/15 2200  vancomycin (VANCOCIN) IVPB 750 mg/150 ml premix  Status:  Discontinued     750 mg 150 mL/hr over 60 Minutes Intravenous Every 24 hours 12/15/15 1959 12/16/15 1742   12/16/15 0600  aztreonam (AZACTAM) 1 g in dextrose 5 % 50 mL IVPB  Status:  Discontinued     1 g 100 mL/hr over 30 Minutes Intravenous 3 times per day 12/15/15 1950 12/16/15 1742   12/15/15 2200  hydroxychloroquine (PLAQUENIL) tablet 200 mg     200 mg Oral 2 times daily 12/15/15 2001     12/15/15 1945  levofloxacin (LEVAQUIN) IVPB 750 mg     750 mg 100 mL/hr over 90 Minutes Intravenous  Once 12/15/15 1935 12/16/15 0005   12/15/15 1945  aztreonam (AZACTAM) 2 g in dextrose 5 % 50 mL IVPB     2 g 100 mL/hr over 30 Minutes Intravenous  Once 12/15/15 1935 12/15/15 2056  12/15/15 1945  vancomycin (VANCOCIN) IVPB 1000 mg/200 mL premix     1,000 mg 200 mL/hr over 60 Minutes Intravenous  Once 12/15/15 1935 12/15/15 2129     Code Status:     Code Status Orders        Start     Ordered   12/15/15 2004  Full code   Continuous     12/15/15 2004    Code Status History    Date Active Date Inactive Code Status Order ID Comments User Context   11/22/2015 11:46 PM 11/30/2015  2:13 PM Full Code [id number]  Clydie Braun, MD ED     Advance Directive Documentation        Most Recent Value   Type of Advance Directive  Healthcare Power of Attorney   Pre-existing out of facility DNR order (yellow form or pink MOST form)     "MOST" Form in Place?       Family Communication: daughter at bedside Disposition Plan: return to SNF- tx to tele DVT prophylaxis: Eliquis Consultants: gen surgery, cardiology Procedures: see imaging below    Objective: [facility] Weights   12/15/15 2112 12/17/15 0551  Weight: 83 kg (182 lb 15.7 oz) 85.5 kg (188 lb 7.9 oz)    Intake/Output Summary (Last 24 hours) at 12/17/15 1422 Last data filed at 12/17/15 0700  Gross per 24 hour  Intake    555 ml  Output   1000 ml  Net   -445 ml     Vitals Filed Vitals:   12/17/15 0400 12/17/15 0551 12/17/15 0600 12/17/15 1125  BP: 109/70  104/68 106/83  Pulse: 66  66 68  Temp: 98.4 F (36.9 C)   97.6 F (36.4 C)  TempSrc: Oral   Oral  Resp: Height:      Weight:  85.5 kg (188 lb 7.9 oz)    SpO2: 100%  94% 99%    Exam:  General:  Pt is alert, not in acute distress  HEENT: No icterus, No thrush, oral mucosa moist  Cardiovascular: regular rate and rhythm, S1/S2 No murmur  Respiratory: clear to auscultation bilaterally   Abdomen: Soft, +Bowel sounds, non tender, non distended, no guarding  MSK: No cyanosis or clubbing- no pedal edema   Data Reviewed: Basic Metabolic Panel:  Recent Labs Lab 12/12/15 2116 12/15/15 1632 12/16/15 0921 12/16/15 1715 12/17/15 0318  NA 124* 125* 130* 128* 126*  K 5.3* 6.1* 5.7* 5.4* 5.1  CL 89* 92* 95* 96* 97*  CO2 25 22 20* 21* 19*  GLUCOSE 145* 122* 101* 80 108*  BUN 15 28* 34* 36* 35*  CREATININE 1.71* 2.19* 2.32* 2.36* 2.17*  CALCIUM 8.8* 8.9 8.6* 8.4* 8.0*  MG  --   --   --   --  1.9   Liver Function Tests:  Recent Labs Lab 12/12/15 2116 12/15/15 1632 12/16/15 0921  AST 35 99* 110*  ALT 34 75* 91*  ALKPHOS 110 299* 232*  BILITOT 1.0 1.2 1.6*  PROT 6.7 7.3 6.1*   ALBUMIN 2.6* 3.0* 2.6*    Recent Labs Lab 12/15/15 1644  LIPASE 27   No results for input(s): AMMONIA in the last 168 hours. CBC:  Recent Labs Lab 12/12/15 2116 12/15/15 1632 12/16/15 0921 12/17/15 0318  WBC 7.2 7.3 7.7 7.7  NEUTROABS 5.1 5.6  --   --   HGB 10.2* 11.2* 10.1* 9.7*  HCT 30.5* 33.1* 29.9* 28.4*  MCV 95.0 93.0 94.3 93.7  PLT  310 306 288 269   Cardiac Enzymes:  Recent Labs Lab 12/15/15 1632  TROPONINI <0.03   BNP (last 3 results)  Recent Labs  11/22/15 2052 12/16/15 0113  BNP 1732.2* 1426.5*    ProBNP (last 3 results) No results for input(s): PROBNP in the last 8760 hours.  CBG:  Recent Labs Lab 12/16/15 0822 12/16/15 1718 12/16/15 2114 12/17/15 0826 12/17/15 1130  GLUCAP 91 77 106* 81 119*    Recent Results (from the past 240 hour(s))  Culture, blood (routine x 2)     Status: None (Preliminary result)   Collection Time: 12/15/15  4:48 PM  Result Value Ref Range Status   Specimen Description BLOOD RIGHT ARM  Final   Special Requests BOTTLES DRAWN AEROBIC AND ANAEROBIC 5CC  Final   Culture   Final    NO GROWTH 2 DAYS Performed at [facility]    Report Status PENDING  Incomplete  MRSA PCR Screening     Status: None   Collection Time: 12/15/15 10:12 PM  Result Value Ref Range Status   MRSA by PCR NEGATIVE NEGATIVE Final    Comment:        The GeneXpert MRSA Assay (FDA approved for NASAL specimens only), is one component of a comprehensive MRSA colonization surveillance program. It is not intended to diagnose MRSA infection nor to guide or monitor treatment for MRSA infections.   Culture, sputum-assessment     Status: None   Collection Time: 12/16/15  4:08 AM  Result Value Ref Range Status   Specimen Description SPUTUM  Final   Special Requests NONE  Final   Sputum evaluation   Final    THIS SPECIMEN IS ACCEPTABLE. RESPIRATORY CULTURE REPORT TO FOLLOW.   Report Status 12/16/2015 FINAL  Final  Culture,  respiratory (NON-Expectorated)     Status: None (Preliminary result)   Collection Time: 12/16/15  4:08 AM  Result Value Ref Range Status   Specimen Description SPUTUM  Final   Special Requests NONE  Final   Gram Stain   Final    FEW WBC PRESENT, PREDOMINANTLY PMN MODERATE SQUAMOUS EPITHELIAL CELLS PRESENT ABUNDANT GRAM POSITIVE COCCI IN PAIRS IN CHAINS FEW GRAM POSITIVE RODS Performed at [facility]    Culture   Final    Culture reincubated for better growth Performed at [facility]    Report Status PENDING  Incomplete  Urine culture     Status: None (Preliminary result)   Collection Time: 12/16/15  3:55 PM  Result Value Ref Range Status   Specimen Description URINE, CATHETERIZED  Final   Special Requests NONE  Final   Culture   Final    NO GROWTH < 12 HOURS Performed at [facility]    Report Status PENDING  Incomplete     Studies: Ct Abdomen Pelvis Wo Contrast  12/16/2015  CLINICAL DATA:  Right mid abdominal pain for 3 days. Nausea and vomiting. EXAM: CT ABDOMEN AND PELVIS WITHOUT CONTRAST TECHNIQUE: Multidetector CT imaging of the abdomen and pelvis was performed following the standard protocol without IV contrast. COMPARISON:  CT 4 days prior 12/12/2015 FINDINGS: Lower chest: Stable cardiomegaly. Bibasilar bronchiectasis, bibasilar opacities, and small pleural effusions, unchanged from prior exam. Liver: No focal lesion allowing for lack contrast. Hepatobiliary: High-density material within the gallbladder, likely combination of sludge and stones. Question of gallbladder wall thickening and pericholecystic soft tissue stranding. No evidence of biliary dilatation. Pancreas: No ductal dilatation or inflammation. Suboptimal pancreatic assessment given lack contrast. Spleen:  Normal. Adrenal glands: Bilateral thickening, no nodule. Kidneys: Bilateral perinephric soft tissue stranding, new from prior. No hydronephrosis. Stomach/Bowel: No abnormal gastric  distention, small hiatal hernia. Limited bowel assessment given lack contrast. There are no dilated or thickened small bowel loops. Enteric chain sutures noted in the left abdomen. No evidence of bowel obstruction. Moderate stool burden. The appendix is not visualized. Vascular/Lymphatic: No retroperitoneal adenopathy. Abdominal aorta is normal in caliber. Moderate atherosclerosis of the abdominal aorta without aneurysm. Reproductive: Left ovarian/adnexal cyst measures 2.6 cm, unchanged from recent prior. Uterus remains in situ. Right ovary is not well seen. Bladder: Physiologically distended. Other: Development of small volume free fluid in both pericolic gutters and in the pelvis. Mild mesenteric edema. No free air. Musculoskeletal: There are no acute or suspicious osseous abnormalities. Degenerative change in the lumbar spine. IMPRESSION: 1. High-density material in the gallbladder, likely combination of small stones and sludge. Questionable gallbladder wall thickening and pericholecystic soft tissue stranding. Given right-sided abdominal pain, acute cholecystitis is considered. Further evaluation with right upper quadrant ultrasound recommended. 2. Development of small volume free fluid in the abdomen and pelvis. Mesenteric and perinephric edema. Findings may be related to hydration status. Correlation with urinalysis recommended to exclude urinary tract infection. 3. Left adnexal cyst measures 2.6 cm, this is unchanged from recent prior, however abnormal in a postmenopausal patient of this age. Nonemergent sonographic follow-up is recommended. 4. Additional chronic findings are stable. Electronically Signed   By: Rubye Oaks M.D.   On: 12/16/2015 04:15   Dg Chest 2 View  12/15/2015  CLINICAL DATA:  Weakness and shortness of Breath EXAM: CHEST  2 VIEW COMPARISON:  12/12/2015 FINDINGS: Cardiac shadow remains enlarged. The lungs are again well aerated. Some increasing density is noted in the right upper  lobe which may represent some early infiltrate. Chronic interstitial changes are noted. No sizable effusion is seen. No acute bony abnormality is noted. IMPRESSION: Increased density in the right upper lobe which may represent acute on chronic infiltrate. Electronically Signed   By: Alcide Clever M.D.   On: 12/15/2015 17:08   Nm Hepatobiliary Including Gb  12/16/2015  CLINICAL DATA:  Right upper quadrant abdominal pain for 1 month. EXAM: NUCLEAR MEDICINE HEPATOBILIARY IMAGING TECHNIQUE: Sequential images of the abdomen were obtained out to 60 minutes following intravenous administration of radiopharmaceutical. RADIOPHARMACEUTICALS:  5.2 mCi Tc-28m  Choletec IV COMPARISON:  CT scan of December 12, 2015. FINDINGS: Prompt uptake and biliary excretion of activity by the liver is seen. Gallbladder activity is visualized, consistent with patency of cystic duct. Biliary activity passes into small bowel, consistent with patent common bile duct. IMPRESSION: Normal uptake is seen within the gallbladder.  No abnormality seen. Electronically Signed   By: Lupita Raider, M.D.   On: 12/16/2015 13:09   Dg Chest Port 1 View  12/17/2015  CLINICAL DATA:  Pulmonary infiltrate. EXAM: PORTABLE CHEST 1 VIEW COMPARISON:  12/15/2015. FINDINGS: Mediastinum and hilar structures normal. Cardiomegaly with normal pulmonary vascularity. Interim clearing of right upper lobe infiltrate. Mild basilar atelectasis. Small pleural effusions cannot be excluded . IMPRESSION: 1. Interim clearing of right upper lobe infiltrate. Persistent mild basilar atelectasis and small bilateral pleural effusions. 2.  Stable cardiomegaly. Electronically Signed   By: Maisie Fus  Register   On: 12/17/2015 07:16   US Abdomen Limited Ruq  12/16/2015  CLINICAL DATA:  Abdominal pain. EXAM: US ABDOMEN LIMITED - RIGHT UPPER QUADRANT COMPARISON:  CT 12/16/2015 . FINDINGS: Gallbladder: No gallstones. Gallbladder wall is thickened up to  3.8 mm. Pericholecystic fluid noted. Negative  Murphy sign. Common bile duct: Diameter: 1.7 mm Liver: No focal lesion identified. Within normal limits in parenchymal echogenicity. Small right pleural effusion. IMPRESSION: 1. No gallstones noted. 2. Gallbladder wall thickening up to 3.8 mm noted. This can be seen with cholecystitis and or hypoproteinemia. Pericholecystic fluid noted. Negative Murphy sign. 3.  Small right pleural effusion. Electronically Signed   By: Maisie Fus  Register   On: 12/16/2015 08:20    Scheduled Meds:  Scheduled Meds: . apixaban  2.5 mg Oral BID  . atorvastatin  10 mg Oral Daily  . folic acid  1 mg Oral Daily  . hydroxychloroquine  200 mg Oral BID  . insulin aspart  0-5 Units Subcutaneous QHS  . insulin aspart  0-9 Units Subcutaneous TID WC  . latanoprost  1 drop Both Eyes QHS  . levothyroxine  75 mcg Oral QAC breakfast   Continuous Infusions: . sodium chloride 50 mL/hr at 12/17/15 0249    Time spent on care of this patient: 35  min   Jomari Bartnik, MD 12/17/2015, 2:22 PM  LOS: 2 days   [facility] Office  [phone] Pager - Text Page per [email or web address] If 7PM-7AM, please contact night-coverage [email or web address]

## 2015-12-17 NOTE — Progress Notes (Signed)
Subjective: Awake and alert.  She denies abdominal pain  Objective: Vital signs in last 24 hours: Temp:  [97.4 F (36.3 C)-98.4 F (36.9 C)] 98.4 F (36.9 C) (04/07 0400) Pulse Rate:  [66-77] 66 (04/07 0600) Resp:  [14-25] 16 (04/07 0600) BP: (103-118)/(61-87) 104/68 mmHg (04/07 0600) SpO2:  [94 %-100 %] 94 % (04/07 0600) Weight:  [85.5 kg (188 lb 7.9 oz)] 85.5 kg (188 lb 7.9 oz) (04/07 0551) Last BM Date: 12/16/15  Intake/Output from previous day: 04/06 0701 - 04/07 0700 In: 855 [I.V.:855] Out: 1100 [Urine:1100] Intake/Output this shift:    PE: General- In NAD Abdomen-soft, not tender, no RUQ mass  Lab Results:   Recent Labs  12/16/15 0921 12/17/15 0318  WBC 7.7 7.7  HGB 10.1* 9.7*  HCT 29.9* 28.4*  PLT 288 269   BMET  Recent Labs  12/16/15 1715 12/17/15 0318  NA 128* 126*  K 5.4* 5.1  CL 96* 97*  CO2 21* 19*  GLUCOSE 80 108*  BUN 36* 35*  CREATININE 2.36* 2.17*  CALCIUM 8.4* 8.0*   PT/INR  Recent Labs  12/15/15 1644  LABPROT 19.9*  INR 1.76*   Comprehensive Metabolic Panel:    Component Value Date/Time   NA 126* 12/17/2015 0318   NA 128* 12/16/2015 1715   K 5.1 12/17/2015 0318   K 5.4* 12/16/2015 1715   CL 97* 12/17/2015 0318   CL 96* 12/16/2015 1715   CO2 19* 12/17/2015 0318   CO2 21* 12/16/2015 1715   BUN 35* 12/17/2015 0318   BUN 36* 12/16/2015 1715   CREATININE 2.17* 12/17/2015 0318   CREATININE 2.36* 12/16/2015 1715   CREATININE 1.30* 12/06/2015 1302   GLUCOSE 108* 12/17/2015 0318   GLUCOSE 80 12/16/2015 1715   CALCIUM 8.0* 12/17/2015 0318   CALCIUM 8.4* 12/16/2015 1715   AST 110* 12/16/2015 0921   AST 99* 12/15/2015 1632   ALT 91* 12/16/2015 0921   ALT 75* 12/15/2015 1632   ALKPHOS 232* 12/16/2015 0921   ALKPHOS 299* 12/15/2015 1632   BILITOT 1.6* 12/16/2015 0921   BILITOT 1.2 12/15/2015 1632   PROT 6.1* 12/16/2015 0921   PROT 7.3 12/15/2015 1632   ALBUMIN 2.6* 12/16/2015 0921   ALBUMIN 3.0* 12/15/2015 1632      Studies/Results: Ct Abdomen Pelvis Wo Contrast  12/16/2015  CLINICAL DATA:  Right mid abdominal pain for 3 days. Nausea and vomiting. EXAM: CT ABDOMEN AND PELVIS WITHOUT CONTRAST TECHNIQUE: Multidetector CT imaging of the abdomen and pelvis was performed following the standard protocol without IV contrast. COMPARISON:  CT 4 days prior 12/12/2015 FINDINGS: Lower chest: Stable cardiomegaly. Bibasilar bronchiectasis, bibasilar opacities, and small pleural effusions, unchanged from prior exam. Liver: No focal lesion allowing for lack contrast. Hepatobiliary: High-density material within the gallbladder, likely combination of sludge and stones. Question of gallbladder wall thickening and pericholecystic soft tissue stranding. No evidence of biliary dilatation. Pancreas: No ductal dilatation or inflammation. Suboptimal pancreatic assessment given lack contrast. Spleen: Normal. Adrenal glands: Bilateral thickening, no nodule. Kidneys: Bilateral perinephric soft tissue stranding, new from prior. No hydronephrosis. Stomach/Bowel: No abnormal gastric distention, small hiatal hernia. Limited bowel assessment given lack contrast. There are no dilated or thickened small bowel loops. Enteric chain sutures noted in the left abdomen. No evidence of bowel obstruction. Moderate stool burden. The appendix is not visualized. Vascular/Lymphatic: No retroperitoneal adenopathy. Abdominal aorta is normal in caliber. Moderate atherosclerosis of the abdominal aorta without aneurysm. Reproductive: Left ovarian/adnexal cyst measures 2.6 cm, unchanged from recent prior. Uterus remains  in situ. Right ovary is not well seen. Bladder: Physiologically distended. Other: Development of small volume free fluid in both pericolic gutters and in the pelvis. Mild mesenteric edema. No free air. Musculoskeletal: There are no acute or suspicious osseous abnormalities. Degenerative change in the lumbar spine. IMPRESSION: 1. High-density material in  the gallbladder, likely combination of small stones and sludge. Questionable gallbladder wall thickening and pericholecystic soft tissue stranding. Given right-sided abdominal pain, acute cholecystitis is considered. Further evaluation with right upper quadrant ultrasound recommended. 2. Development of small volume free fluid in the abdomen and pelvis. Mesenteric and perinephric edema. Findings may be related to hydration status. Correlation with urinalysis recommended to exclude urinary tract infection. 3. Left adnexal cyst measures 2.6 cm, this is unchanged from recent prior, however abnormal in a postmenopausal patient of this age. Nonemergent sonographic follow-up is recommended. 4. Additional chronic findings are stable. Electronically Signed   By: Rubye Oaks M.D.   On: 12/16/2015 04:15   Dg Chest 2 View  12/15/2015  CLINICAL DATA:  Weakness and shortness of Breath EXAM: CHEST  2 VIEW COMPARISON:  12/12/2015 FINDINGS: Cardiac shadow remains enlarged. The lungs are again well aerated. Some increasing density is noted in the right upper lobe which may represent some early infiltrate. Chronic interstitial changes are noted. No sizable effusion is seen. No acute bony abnormality is noted. IMPRESSION: Increased density in the right upper lobe which may represent acute on chronic infiltrate. Electronically Signed   By: Alcide Clever M.D.   On: 12/15/2015 17:08   Nm Hepatobiliary Including Gb  12/16/2015  CLINICAL DATA:  Right upper quadrant abdominal pain for 1 month. EXAM: NUCLEAR MEDICINE HEPATOBILIARY IMAGING TECHNIQUE: Sequential images of the abdomen were obtained out to 60 minutes following intravenous administration of radiopharmaceutical. RADIOPHARMACEUTICALS:  5.2 mCi Tc-42m  Choletec IV COMPARISON:  CT scan of December 12, 2015. FINDINGS: Prompt uptake and biliary excretion of activity by the liver is seen. Gallbladder activity is visualized, consistent with patency of cystic duct. Biliary activity  passes into small bowel, consistent with patent common bile duct. IMPRESSION: Normal uptake is seen within the gallbladder.  No abnormality seen. Electronically Signed   By: Lupita Raider, M.D.   On: 12/16/2015 13:09   Dg Chest Port 1 View  12/17/2015  CLINICAL DATA:  Pulmonary infiltrate. EXAM: PORTABLE CHEST 1 VIEW COMPARISON:  12/15/2015. FINDINGS: Mediastinum and hilar structures normal. Cardiomegaly with normal pulmonary vascularity. Interim clearing of right upper lobe infiltrate. Mild basilar atelectasis. Small pleural effusions cannot be excluded . IMPRESSION: 1. Interim clearing of right upper lobe infiltrate. Persistent mild basilar atelectasis and small bilateral pleural effusions. 2.  Stable cardiomegaly. Electronically Signed   By: Maisie Fus  Register   On: 12/17/2015 07:16   US Abdomen Limited Ruq  12/16/2015  CLINICAL DATA:  Abdominal pain. EXAM: US ABDOMEN LIMITED - RIGHT UPPER QUADRANT COMPARISON:  CT 12/16/2015 . FINDINGS: Gallbladder: No gallstones. Gallbladder wall is thickened up to 3.8 mm. Pericholecystic fluid noted. Negative Murphy sign. Common bile duct: Diameter: 1.7 mm Liver: No focal lesion identified. Within normal limits in parenchymal echogenicity. Small right pleural effusion. IMPRESSION: 1. No gallstones noted. 2. Gallbladder wall thickening up to 3.8 mm noted. This can be seen with cholecystitis and or hypoproteinemia. Pericholecystic fluid noted. Negative Murphy sign. 3.  Small right pleural effusion. Electronically Signed   By: Maisie Fus  Register   On: 12/16/2015 08:20    Anti-infectives: Anti-infectives    Start     Dose/Rate Route Frequency  Ordered Stop   12/17/15 2200  levofloxacin (LEVAQUIN) IVPB 750 mg  Status:  Discontinued     750 mg 100 mL/hr over 90 Minutes Intravenous Every 48 hours 12/15/15 1952 12/16/15 1742   12/16/15 2200  vancomycin (VANCOCIN) IVPB 750 mg/150 ml premix  Status:  Discontinued     750 mg 150 mL/hr over 60 Minutes Intravenous Every 24 hours  12/15/15 1959 12/16/15 1742   12/16/15 0600  aztreonam (AZACTAM) 1 g in dextrose 5 % 50 mL IVPB  Status:  Discontinued     1 g 100 mL/hr over 30 Minutes Intravenous 3 times per day 12/15/15 1950 12/16/15 1742   12/15/15 2200  hydroxychloroquine (PLAQUENIL) tablet 200 mg     200 mg Oral 2 times daily 12/15/15 2001     12/15/15 1945  levofloxacin (LEVAQUIN) IVPB 750 mg     750 mg 100 mL/hr over 90 Minutes Intravenous  Once 12/15/15 1935 12/16/15 0005   12/15/15 1945  aztreonam (AZACTAM) 2 g in dextrose 5 % 50 mL IVPB     2 g 100 mL/hr over 30 Minutes Intravenous  Once 12/15/15 1935 12/15/15 2056   12/15/15 1945  vancomycin (VANCOCIN) IVPB 1000 mg/200 mL premix     1,000 mg 200 mL/hr over 60 Minutes Intravenous  Once 12/15/15 1935 12/15/15 2129      Assessment Principal Problem:   HCAP (healthcare-associated pneumonia)   Abnormal gallbladder on CT-US shows some GB wall thickness (most likely from hypoproteinemia), but no stones.  HIDA is normal (sensitivity 97%, specificity 94% for acute cholecystitis) thus, I do not feel she has acute cholecystitis).  Discussed with her and her family member.   LOS: 2 days   Plan: Please let us know if we can be of further assistance.   Valoria Tamburri J 12/17/2015

## 2015-12-17 NOTE — Consult Note (Signed)
CARDIOLOGY CONSULT NOTE       Patient ID: Theresa Cortez MRN: [id number] DOB/AGE: [birth year] 80 y.o.  Admit date: 12/15/2015 Referring Physician:  Butler Denmark Primary Physician: Gwenyth Bender, MD Primary Cardiologist:  Croitoru Reason for Consultation: CHF  Principal Problem:   HCAP (healthcare-associated pneumonia) Active Problems:   Hypertension   Leg pain, right   Chronic combined systolic and diastolic heart failure (HCC)   Tricuspid regurgitation   Pulmonary hypertension (HCC)   Diabetes mellitus, type 2 (HCC)   Acute renal failure superimposed on stage 3 chronic kidney disease (HCC)   HLD (hyperlipidemia)   Hypothyroidism   Sepsis (HCC)   RA (rheumatoid arthritis) (HCC)   Hyperkalemia   Hyponatremia   Chronic atrial fibrillation (HCC)   Hypotension   Adnexal cyst - left   Protein-calorie malnutrition, moderate (HCC)   Abdominal pain   Lactic acidosis   HPI:   Theresa Cortez is a 82 y.o.  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 Seen by surgery and w/u not consistent with cholecystitis. Improving Dyspnea improved admission CXR NAD f/u ? RUL infiltrate BNP 1426.    She was hospitalized last month for CHF.  New diagnosis of non ischemic DCM.  Cath with no CAD Echo 3/15 with EF 25-30% and moderate to severe MR.  PA pressure 57/28 with mean  PCWP 28.  Was taking lasix qod.  No LE edema Was sent to rehab at [facility] after this admission Still weak.    No chest pain palpitations or syncope.  Taking amiodarone and eliquis for PAF    ROS All other systems reviewed and negative except as noted above  Past Medical History    Diagnosis Date  . Hypertension   . Wound infection (HCC)     Right knee   . Hypothyroidism   . History of diverticulosis   . Hyperlipidemia   . Type II diabetes mellitus (HCC)   . History of blood transfusion X 2    w/knee replacement  . Stroke [facility]) 2000    denies residual on 11/23/2015  . Rheumatoid arthritis(714.0)   . DJD (degenerative joint disease)   . Arthritis     "hands, arms" (11/23/2015)  . CKD (chronic kidney disease), stage III   . Chronic combined systolic and diastolic heart failure (HCC)   . Atrial fibrillation (HCC)     Family History  Problem Relation Age of Onset  . Hypertension Mother     Social History   Social History  . Marital Status: Widowed    Spouse Name: N/A  . Number of Children: N/A  . Years of Education: N/A   Occupational History  . Not on file.   Social History Main Topics  . Smoking status: Never Smoker   . Smokeless tobacco: Former Neurosurgeon    Types: Snuff  . Alcohol Use: No  . Drug Use: No  . Sexual Activity: No   Other Topics Concern  . Not on file   Social History Narrative    Past Surgical History  Procedure Laterality Date  . Knee arthroscopy Right   . Total knee arthroplasty Right 08/07/2011; 2014  . Partial colectomy      descending colon by Dr. Orson Slick  . Appendectomy    . Medial  partial knee replacement Right   . Incision and drainage mouth Right 2014    "knee; took replacement out; put  spacers in"  . Tubal ligation    . Cataract extraction w/ intraocular lens implant Right   . Cardiac catheterization N/A 11/25/2015    Procedure: Right/Left Heart Cath and Coronary Angiography;  Surgeon: Iran Ouch, MD;  Location: [facility] INVASIVE CV LAB;  Service: Cardiovascular;  Laterality: N/A;     . amiodarone  200 mg Oral BID  . apixaban  2.5 mg Oral BID  . atorvastatin  10 mg Oral Daily  . folic acid  1 mg Oral Daily  . hydroxychloroquine  200 mg Oral BID  . insulin aspart  0-5 Units Subcutaneous QHS  . insulin aspart   0-9 Units Subcutaneous TID WC  . latanoprost  1 drop Both Eyes QHS  . levothyroxine  75 mcg Oral QAC breakfast   . sodium chloride 50 mL/hr at 12/17/15 0249    Physical Exam: Blood pressure 104/68, pulse 66, temperature 98.4 F (36.9 C), temperature source Oral, resp. rate 16, height 5\' 6"  (1.676 m), weight 85.5 kg (188 lb 7.9 oz), SpO2 94 %.    Affect appropriate Chronically ill black female  HEENT: normal Neck supple with no adenopathy JVP normal no bruits no thyromegaly Lungs clear with no wheezing and good diaphragmatic motion Heart:  S1/S2 MR  murmur, no rub, gallop or click PMI normal Abdomen: benighn, BS positve, no tenderness, no AAA no bruit.  No HSM or HJR Distal pulses intact with no bruits No edema Neuro non-focal Skin warm and dry No muscular weakness  Labs:   Lab Results  Component Value Date   WBC 7.7 12/17/2015   HGB 9.7* 12/17/2015   HCT 28.4* 12/17/2015   MCV 93.7 12/17/2015   PLT 269 12/17/2015    Recent Labs Lab 12/16/15 0921  12/17/15 0318  NA 130*  < > 126*  K 5.7*  < > 5.1  CL 95*  < > 97*  CO2 20*  < > 19*  BUN 34*  < > 35*  CREATININE 2.32*  < > 2.17*  CALCIUM 8.6*  < > 8.0*  PROT 6.1*  --   --   BILITOT 1.6*  --   --   ALKPHOS 232*  --   --   ALT 91*  --   --   AST 110*  --   --   GLUCOSE 101*  < > 108*  < > = values in this interval not displayed. Lab Results  Component Value Date   TROPONINI <0.03 12/15/2015    Lab Results  Component Value Date   CHOL 90 12/16/2015   Lab Results  Component Value Date   HDL 40* 12/16/2015   Lab Results  Component Value Date   LDLCALC 37 12/16/2015   Lab Results  Component Value Date   TRIG 67 12/16/2015   Lab Results  Component Value Date   CHOLHDL 2.3 12/16/2015   No results found for: LDLDIRECT    Radiology: Ct Abdomen Pelvis Wo Contrast  12/16/2015  CLINICAL DATA:  Right mid abdominal pain for 3 days. Nausea and vomiting. EXAM: CT ABDOMEN AND PELVIS WITHOUT CONTRAST  TECHNIQUE: Multidetector CT imaging of the abdomen and pelvis was performed following the standard protocol without IV contrast. COMPARISON:  CT 4 days prior 12/12/2015 FINDINGS: Lower chest: Stable cardiomegaly. Bibasilar bronchiectasis, bibasilar opacities, and small pleural effusions, unchanged from prior exam. Liver: No focal lesion allowing  for lack contrast. Hepatobiliary: High-density material within the gallbladder, likely combination of sludge and stones. Question of gallbladder wall thickening and pericholecystic soft tissue stranding. No evidence of biliary dilatation. Pancreas: No ductal dilatation or inflammation. Suboptimal pancreatic assessment given lack contrast. Spleen: Normal. Adrenal glands: Bilateral thickening, no nodule. Kidneys: Bilateral perinephric soft tissue stranding, new from prior. No hydronephrosis. Stomach/Bowel: No abnormal gastric distention, small hiatal hernia. Limited bowel assessment given lack contrast. There are no dilated or thickened small bowel loops. Enteric chain sutures noted in the left abdomen. No evidence of bowel obstruction. Moderate stool burden. The appendix is not visualized. Vascular/Lymphatic: No retroperitoneal adenopathy. Abdominal aorta is normal in caliber. Moderate atherosclerosis of the abdominal aorta without aneurysm. Reproductive: Left ovarian/adnexal cyst measures 2.6 cm, unchanged from recent prior. Uterus remains in situ. Right ovary is not well seen. Bladder: Physiologically distended. Other: Development of small volume free fluid in both pericolic gutters and in the pelvis. Mild mesenteric edema. No free air. Musculoskeletal: There are no acute or suspicious osseous abnormalities. Degenerative change in the lumbar spine. IMPRESSION: 1. High-density material in the gallbladder, likely combination of small stones and sludge. Questionable gallbladder wall thickening and pericholecystic soft tissue stranding. Given right-sided abdominal pain, acute  cholecystitis is considered. Further evaluation with right upper quadrant ultrasound recommended. 2. Development of small volume free fluid in the abdomen and pelvis. Mesenteric and perinephric edema. Findings may be related to hydration status. Correlation with urinalysis recommended to exclude urinary tract infection. 3. Left adnexal cyst measures 2.6 cm, this is unchanged from recent prior, however abnormal in a postmenopausal patient of this age. Nonemergent sonographic follow-up is recommended. 4. Additional chronic findings are stable. Electronically Signed   By: Rubye Oaks M.D.   On: 12/16/2015 04:15   Ct Abdomen Pelvis Wo Contrast  12/12/2015  CLINICAL DATA:  Acute onset of right flank pain and increased generalized weakness. Initial encounter. EXAM: CT ABDOMEN AND PELVIS WITHOUT CONTRAST TECHNIQUE: Multidetector CT imaging of the abdomen and pelvis was performed following the standard protocol without IV contrast. COMPARISON:  CT of the abdomen and pelvis performed 12/28/2004, and right upper quadrant ultrasound performed 11/23/2015 FINDINGS: Bibasilar bronchiectasis is noted. Patchy bibasilar opacities likely reflect atelectasis or scarring. The heart is mildly enlarged. The liver and spleen are unremarkable in appearance. The gallbladder is within normal limits. The pancreas. Is unremarkable. There is prominence of the adrenal glands, concerning for mild adrenal hyperplasia. The kidneys are unremarkable in appearance. There is no evidence of hydronephrosis. No renal or ureteral stones are seen. No perinephric stranding is appreciated. No free fluid is identified. The small bowel is unremarkable in appearance. The stomach is within normal limits. No acute vascular abnormalities are seen. Mild scattered calcification is seen along the abdominal aorta and its branches. The patient is status post appendectomy. The colon is unremarkable in appearance. The bladder is mildly distended and grossly  remarkable. A small fibroid is suggested at the lower uterine segment. The uterus is otherwise unremarkable. The ovaries are grossly symmetric, aside from a likely 2.5 cm left adnexal cyst. No inguinal lymphadenopathy is seen. No acute osseous abnormalities are identified. Facet disease is noted at the lower lumbar spine. IMPRESSION: 1. No acute abnormality seen to explain the patient's symptoms. 2. Bibasilar bronchiectasis noted. Patchy bibasilar airspace opacities likely reflect atelectasis or scarring. 3. Prominence of the adrenal glands, concerning for mild adrenal hyperplasia. 4. Mild scattered calcification along the abdominal aorta and its branches. 5. Small uterine fibroid suggested. 6. Mild  cardiomegaly. Electronically Signed   By: Roanna Raider M.D.   On: 12/12/2015 23:06   Dg Chest 2 View  12/15/2015  CLINICAL DATA:  Weakness and shortness of Breath EXAM: CHEST  2 VIEW COMPARISON:  12/12/2015 FINDINGS: Cardiac shadow remains enlarged. The lungs are again well aerated. Some increasing density is noted in the right upper lobe which may represent some early infiltrate. Chronic interstitial changes are noted. No sizable effusion is seen. No acute bony abnormality is noted. IMPRESSION: Increased density in the right upper lobe which may represent acute on chronic infiltrate. Electronically Signed   By: Alcide Clever M.D.   On: 12/15/2015 17:08   Dg Chest 2 View  12/12/2015  CLINICAL DATA:  Back and right sided flank pain for several days EXAM: CHEST  2 VIEW COMPARISON:  None. FINDINGS: Cardiac shadow is mildly enlarged. Chronic bibasilar opacities are noted. No focal infiltrate or sizable effusion is noted. No bony abnormality is noted. IMPRESSION: Chronic changes without acute abnormality. Electronically Signed   By: Alcide Clever M.D.   On: 12/12/2015 21:26   Dg Chest 2 View  12/06/2015  CLINICAL DATA:  Cough for 2 weeks.  Congestion. EXAM: CHEST  2 VIEW COMPARISON:  11/25/2015 FINDINGS: There is  hyperinflation of the lungs compatible with COPD. Patchy airspace disease noted over both mid and lower lungs, new since prior study concerning for multifocal pneumonia. No pleural effusions. Heart is mildly enlarged. No acute bony abnormality. IMPRESSION: COPD, cardiomegaly. Patchy bilateral mid and lower lung airspace opacities concerning for pneumonia. Electronically Signed   By: Charlett Nose M.D.   On: 12/06/2015 15:03   Dg Chest 2 View  11/22/2015  CLINICAL DATA:  Cough for 2 day EXAM: CHEST  2 VIEW COMPARISON:  04/26/2015 FINDINGS: The heart is moderately enlarged. Lungs are hyperaerated. Pulmonary vascular is within normal limits. Tiny pleural effusions and bibasilar atelectasis. No pneumothorax. IMPRESSION: Cardiomegaly without decompensation. Tiny pleural effusions and bibasilar atelectasis. Electronically Signed   By: Jolaine Click M.D.   On: 11/22/2015 18:02   Dg Thoracic Spine W/swimmers  11/28/2015  CLINICAL DATA:  Cough and upper back pain. EXAM: THORACIC SPINE - 3 VIEWS COMPARISON:  Portable chest dated 11/25/2015. Two-view chest dated 11/22/2015. FINDINGS: Multilevel degenerative changes in the thoracic and cervical spine. No fractures or subluxations. IMPRESSION: No acute abnormality.  Multilevel degenerative changes. Electronically Signed   By: Beckie Salts M.D.   On: 11/28/2015 11:06   Nm Hepatobiliary Including Gb  12/16/2015  CLINICAL DATA:  Right upper quadrant abdominal pain for 1 month. EXAM: NUCLEAR MEDICINE HEPATOBILIARY IMAGING TECHNIQUE: Sequential images of the abdomen were obtained out to 60 minutes following intravenous administration of radiopharmaceutical. RADIOPHARMACEUTICALS:  5.2 mCi Tc-69m  Choletec IV COMPARISON:  CT scan of December 12, 2015. FINDINGS: Prompt uptake and biliary excretion of activity by the liver is seen. Gallbladder activity is visualized, consistent with patency of cystic duct. Biliary activity passes into small bowel, consistent with patent common bile  duct. IMPRESSION: Normal uptake is seen within the gallbladder.  No abnormality seen. Electronically Signed   By: Lupita Raider, M.D.   On: 12/16/2015 13:09   US Renal  11/23/2015  CLINICAL DATA:  Acute renal failure EXAM: RENAL / URINARY TRACT ULTRASOUND COMPLETE COMPARISON:  01/27/2011 FINDINGS: Right Kidney: Length: 10.9 cm. Normal echogenicity with no mass or hydronephrosis. 9 x 5 x 5 mm exophytic lower pole cyst. Left Kidney: Length: 9.7 cm. Echogenicity within normal limits. No mass or hydronephrosis visualized.  Bladder: Appears normal for degree of bladder distention. IMPRESSION: No significant abnormalities Electronically Signed   By: Esperanza Heir M.D.   On: 11/23/2015 12:21   Nm Pulmonary Perf And Vent  11/23/2015  CLINICAL DATA:  82 year old female with progressive shortness of breath cough and mid scapular pain. Abnormal D-dimer. Initial encounter. EXAM: NUCLEAR MEDICINE VENTILATION - PERFUSION LUNG SCAN TECHNIQUE: Ventilation images were obtained in multiple projections using inhaled aerosol Tc-22m DTPA. Perfusion images were obtained in multiple projections after intravenous injection of Tc-58m MAA. RADIOPHARMACEUTICALS:  32.0 Technetium-52m DTPA aerosol inhalation and 4.3 Technetium-57m MAA IV COMPARISON:  PA and lateral chest radiographs 11/22/2015. FINDINGS: Ventilation: No ventilation defect identified. Photopenia related to cardiomegaly noted. Perfusion: Homogeneous perfusion radiotracer activity. No wedge shaped peripheral perfusion defects to suggest acute pulmonary embolism. IMPRESSION: Normal VQ scan. Electronically Signed   By: Odessa Fleming M.D.   On: 11/23/2015 16:07   Dg Chest Port 1 View  12/17/2015  CLINICAL DATA:  Pulmonary infiltrate. EXAM: PORTABLE CHEST 1 VIEW COMPARISON:  12/15/2015. FINDINGS: Mediastinum and hilar structures normal. Cardiomegaly with normal pulmonary vascularity. Interim clearing of right upper lobe infiltrate. Mild basilar atelectasis. Small pleural  effusions cannot be excluded . IMPRESSION: 1. Interim clearing of right upper lobe infiltrate. Persistent mild basilar atelectasis and small bilateral pleural effusions. 2.  Stable cardiomegaly. Electronically Signed   By: Maisie Fus  Register   On: 12/17/2015 07:16   Dg Chest Port 1v Same Day  11/25/2015  CLINICAL DATA:  Chest wheezing today. EXAM: PORTABLE CHEST 1 VIEW COMPARISON:  November 22, 2015 FINDINGS: The heart size and mediastinal contours are stable. The heart size is enlarged. The lungs are hyperinflated. There is no focal infiltrate, pulmonary edema, or pleural effusion. The visualized skeletal structures are stable. IMPRESSION: No active cardiopulmonary disease.  Cardiomegaly.  Emphysema. Electronically Signed   By: Sherian Rein M.D.   On: 11/25/2015 16:58   US Abdomen Limited Ruq  12/16/2015  CLINICAL DATA:  Abdominal pain. EXAM: US ABDOMEN LIMITED - RIGHT UPPER QUADRANT COMPARISON:  CT 12/16/2015 . FINDINGS: Gallbladder: No gallstones. Gallbladder wall is thickened up to 3.8 mm. Pericholecystic fluid noted. Negative Murphy sign. Common bile duct: Diameter: 1.7 mm Liver: No focal lesion identified. Within normal limits in parenchymal echogenicity. Small right pleural effusion. IMPRESSION: 1. No gallstones noted. 2. Gallbladder wall thickening up to 3.8 mm noted. This can be seen with cholecystitis and or hypoproteinemia. Pericholecystic fluid noted. Negative Murphy sign. 3.  Small right pleural effusion. Electronically Signed   By: Maisie Fus  Register   On: 12/16/2015 08:20   US Abdomen Limited Ruq  11/23/2015  CLINICAL DATA:  Elevated liver function tests EXAM: US ABDOMEN LIMITED - RIGHT UPPER QUADRANT COMPARISON:  12/28/2004 FINDINGS: Gallbladder: No gallstones or wall thickening visualized. No sonographic Murphy sign noted by sonographer. Common bile duct: Diameter: 3 mm Liver: No focal lesion identified. Within normal limits in parenchymal echogenicity. IMPRESSION: Normal right upper quadrant  ultrasound Electronically Signed   By: Esperanza Heir M.D.   On: 11/23/2015 12:18    EKG: SR LBBB   ASSESSMENT AND PLAN:  CHF:  Combination of DCM and MR. Diuretics held currently due to RF.  Would start 20 mg lasix daily when Cr stable She is currently compensated and does not Appear volume overloaded  Also add low dose ACE when possible PAF: maint NSR on Eliquis  LFTls elevated etiology not clear but should hold amiodarone until resloved needs TSH/T4  As well as outpatient PFTls Abdomen: etiology of  elevated LFTls , pain , elevated lactic acid not clear.  Seems to be improving plan per IM  Surgery has signed off   Signed: Charlton Haws 12/17/2015, 10:05 AM

## 2015-12-17 NOTE — Evaluation (Addendum)
Occupational Therapy Evaluation Patient Details Name: Theresa Cortez MRN: [id number] DOB: 15-Jul-1930 Today's Date: 12/17/2015    History of Present Illness This 82 year old female was admitted from SNF, where she was undergoing rehab from last admission, with cough, nausea/vomiting and abdominal pain.  She has a PMH of HTN, DM, CHF with EF of 25-30%, A-Fib, CKD, RD, DJD   Clinical Impression   Pt was admitted for the above.  She states she has had a recent decline in function since being at rehab.  She has been needing a lot of assistance with adls recently.  Pt is generally weak and will benefit from continued OT. Goals in acute are for min to mod A for most ADLs and SPT to 3:1 commode.    Follow Up Recommendations  SNF    Equipment Recommendations  3 in 1 bedside comode    Recommendations for Other Services       Precautions / Restrictions Precautions Precautions: Fall Restrictions Weight Bearing Restrictions: No      Mobility Bed Mobility Overal bed mobility: Needs Assistance       Supine to sit: Min assist;+2 for physical assistance;+2 for safety/equipment     General bed mobility comments: assistance for legs and light assistance for trunk.  HOB was raised  Transfers Overall transfer level: Needs assistance Equipment used: Rolling walker (2 wheeled) Transfers: Sit to/from [employer] Sit to Stand: Mod assist;+2 physical assistance;+2 safety/equipment Stand pivot transfers: Mod assist;+2 safety/equipment;+2 physical assistance       General transfer comment: assistance to rise and stabilize.  Assistance to control descent.      Balance                                            ADL Overall ADL's : Needs assistance/impaired     Grooming: Minimal assistance;Sitting   Upper Body Bathing: Maximal assistance;Sitting   Lower Body Bathing: Total assistance;+2 for safety/equipment;Sit to/from stand   Upper Body Dressing :  Maximal assistance;Sitting   Lower Body Dressing: Total assistance;+2 for safety/equipment;Sit to/from stand   Toilet Transfer: Moderate assistance;+2 for safety/equipment;Stand-pivot;RW (recliner)   Toileting- Clothing Manipulation and Hygiene: Total assistance;+2 for safety/equipment;Sit to/from stand         General ADL Comments: pt is feeling generally weak.  Encouraged her to do part of UB adls with rest breaks to build strength up.  She was agreeable to sitting up in chair.  Gave level one theraband to work on horizontal abduction and shoulder flexion strength     Vision     Perception     Praxis      Pertinent Vitals/Pain Pain Assessment: No/denies pain     Hand Dominance     Extremity/Trunk Assessment Upper Extremity Assessment Upper Extremity Assessment: Generalized weakness           Communication Communication Communication: No difficulties   Cognition Arousal/Alertness: Awake/alert Behavior During Therapy: WFL for tasks assessed/performed Overall Cognitive Status: Within Functional Limits for tasks assessed                     General Comments       Exercises       Shoulder Instructions      Home Living Family/patient expects to be discharged to:: Skilled nursing facility  Additional Comments: pt was at [facility] for ST rehab following last admission      Prior Functioning/Environment Level of Independence: Needs assistance             OT Diagnosis: Generalized weakness   OT Problem List: Decreased strength;Decreased activity tolerance;Impaired balance (sitting and/or standing);Decreased safety awareness;Decreased knowledge of use of DME or AE   OT Treatment/Interventions: Self-care/ADL training;Energy conservation;Patient/family education;Balance training;DME and/or AE instruction;Therapeutic activities    OT Goals(Current goals can be found in the care plan section) Acute Rehab  OT Goals Patient Stated Goal: get stronger OT Goal Formulation: With patient Time For Goal Achievement: 12/31/15 Potential to Achieve Goals: Good ADL Goals Pt Will Perform Grooming: with set-up;sitting Pt Will Perform Upper Body Bathing: with set-up;sitting Pt Will Perform Lower Body Bathing: with mod assist;with adaptive equipment;sit to/from stand Pt Will Perform Upper Body Dressing: with min assist;sitting Pt Will Transfer to Toilet: with min assist;bedside commode;stand pivot transfer Pt Will Perform Toileting - Clothing Manipulation and hygiene: with mod assist;sit to/from stand Additional ADL Goal #1: pt will initiate at least one rest break for energy conservation  OT Frequency: Min 2X/week   Barriers to D/C:            Co-evaluation PT/OT/SLP Co-Evaluation/Treatment: Yes Reason for Co-Treatment: For patient/therapist safety PT goals addressed during session: Mobility/safety with mobility OT goals addressed during session: ADL's and self-care      End of Session    Activity Tolerance: Patient tolerated treatment well Patient left: in chair;with call bell/phone within reach;with chair alarm set;with family/visitor present (maximove pad placed in chair)   Time: 5621-3086 OT Time Calculation (min): 20 min Charges:  OT General Charges $OT Visit: 1 Procedure OT Evaluation $OT Eval Moderate Complexity: 1 Procedure G-Codes:    Kaymon Denomme 01-06-2016, 11:17 AM  Marica Otter, OTR/L [phone] 01/06/16

## 2015-12-17 NOTE — [place] FL2 (Signed)
[place] MEDICAID FL2 LEVEL OF CARE SCREENING TOOL     IDENTIFICATION  Patient Name: Theresa Cortez Birthdate: 10-Oct-1929 Sex: female Admission Date (Current Location): 12/15/2015  [facility] and [place] Number:  Producer, television/film/video and Address:  [facility],  [address] [place] [address]      Provider Number: [id number]  Attending Physician Name and Address:  Calvert Cantor, MD  Relative Name and Phone Number:       Current Level of Care: Hospital Recommended Level of Care: Skilled Nursing Facility Prior Approval Number:    Date Approved/Denied:   PASRR Number: [id number]  Discharge Plan: SNF    Current Diagnoses: Patient Active Problem List   Diagnosis Date Noted  . Adnexal cyst - left 12/16/2015  . Protein-calorie malnutrition, moderate (HCC) 12/16/2015  . Abdominal pain   . Lactic acidosis   . HLD (hyperlipidemia) 12/15/2015  . Hypothyroidism 12/15/2015  . HCAP (healthcare-associated pneumonia) 12/15/2015  . Sepsis (HCC) 12/15/2015  . RA (rheumatoid arthritis) (HCC) 12/15/2015  . Hyperkalemia 12/15/2015  . Hyponatremia 12/15/2015  . Chronic atrial fibrillation (HCC) 12/15/2015  . Hypotension 12/15/2015  . Arterial hypotension   . Pain of upper abdomen   . Diabetes mellitus, type 2 (HCC) 12/06/2015  . Acute renal failure superimposed on stage 3 chronic kidney disease (HCC) 12/06/2015  . Atrial fibrillation with rapid ventricular response (HCC)   . ARF (acute renal failure) (HCC)   . Elevated LFTs   . Positive D dimer   . Chronic combined systolic and diastolic heart failure (HCC)   . Mitral regurgitation   . Tricuspid regurgitation   . Pulmonary hypertension (HCC)   . Heart failure (HCC) 11/22/2015  . Acute congestive heart failure (HCC) 11/22/2015  . Hypertension 10/11/2012  . Cellulitis of knee, right 10/11/2012  . DM (diabetes mellitus) (HCC) 10/11/2012  . Dermatitis 10/11/2012  . Leg pain, right 10/11/2012    Orientation  RESPIRATION BLADDER Height & Weight     Self, Time, Situation, Place  O2 Indwelling catheter Weight: 188 lb 7.9 oz (85.5 kg) Height:  5\' 6"  (167.6 cm)  BEHAVIORAL SYMPTOMS/MOOD NEUROLOGICAL BOWEL NUTRITION STATUS      Continent Diet (Carb Modified)  AMBULATORY STATUS COMMUNICATION OF NEEDS Skin   Extensive Assist Verbally Normal                       Personal Care Assistance Level of Assistance  Bathing, Dressing Bathing Assistance: Maximum assistance   Dressing Assistance: Maximum assistance     Functional Limitations Info  Sight Sight Info: Impaired        SPECIAL CARE FACTORS FREQUENCY  PT (By licensed PT), OT (By licensed OT)     PT Frequency: 5 OT Frequency: 5            Contractures      Additional Factors Info  Code Status, Insulin Sliding Scale, Allergies Code Status Info: Fullcode Allergies Info: Penicillins   Insulin Sliding Scale Info: 4/day       Current Medications (12/17/2015):  This is the current hospital active medication list Current Facility-Administered Medications  Medication Dose Route Frequency Provider Last Rate Last Dose  . 0.9 %  sodium chloride infusion   Intravenous Continuous 02/16/2016, MD 50 mL/hr at 12/17/15 0249    . albuterol (PROVENTIL) (2.5 MG/3ML) 0.083% nebulizer solution 2.5 mg  2.5 mg Nebulization Q4H PRN 02/16/16, MD      . alum & mag hydroxide-simeth (MAALOX/MYLANTA) 200-200-20  MG/5ML suspension 30 mL  30 mL Oral Q6H PRN Lorretta Harp, MD      . apixaban Everlene Balls) tablet 2.5 mg  2.5 mg Oral BID Calvert Cantor, MD   2.5 mg at 12/17/15 0945  . atorvastatin (LIPITOR) tablet 10 mg  10 mg Oral Daily Lorretta Harp, MD   10 mg at 12/17/15 0945  . benzonatate (TESSALON) capsule 200 mg  200 mg Oral Q8H PRN Calvert Cantor, MD   200 mg at 12/17/15 1404  . folic acid (FOLVITE) tablet 1 mg  1 mg Oral Daily Lorretta Harp, MD   1 mg at 12/17/15 0946  . guaiFENesin-codeine 100-10 MG/5ML solution 10 mL  10 mL Oral QHS PRN Lorretta Harp, MD   10 mL at  12/16/15 2200  . hydroxychloroquine (PLAQUENIL) tablet 200 mg  200 mg Oral BID Lorretta Harp, MD   200 mg at 12/17/15 0949  . insulin aspart (novoLOG) injection 0-5 Units  0-5 Units Subcutaneous QHS Lorretta Harp, MD   0 Units at 12/15/15 2200  . insulin aspart (novoLOG) injection 0-9 Units  0-9 Units Subcutaneous TID WC Lorretta Harp, MD   0 Units at 12/16/15 0800  . latanoprost (XALATAN) 0.005 % ophthalmic solution 1 drop  1 drop Both Eyes QHS Lorretta Harp, MD   1 drop at 12/16/15 2200  . levothyroxine (SYNTHROID, LEVOTHROID) tablet 75 mcg  75 mcg Oral QAC breakfast Lorretta Harp, MD   75 mcg at 12/17/15 0946  . morphine 2 MG/ML injection 1 mg  1 mg Intravenous Q4H PRN Lorretta Harp, MD      . ondansetron [facility]) injection 4 mg  4 mg Intravenous Q8H PRN Lorretta Harp, MD      . senna (SENOKOT) tablet 8.6 mg  1 tablet Oral QHS PRN Lorretta Harp, MD         Discharge Medications: Please see discharge summary for a list of discharge medications.  Relevant Imaging Results:  Relevant Lab Results:   Additional Information SS#: [id number]  Arlyss Repress, LCSW

## 2015-12-18 LAB — CULTURE, RESPIRATORY W GRAM STAIN: Culture: NORMAL

## 2015-12-18 LAB — GLUCOSE, CAPILLARY
GLUCOSE-CAPILLARY: 78 mg/dL (ref 65–99)
GLUCOSE-CAPILLARY: 91 mg/dL (ref 65–99)
Glucose-Capillary: 114 mg/dL — ABNORMAL HIGH (ref 65–99)
Glucose-Capillary: 144 mg/dL — ABNORMAL HIGH (ref 65–99)

## 2015-12-18 LAB — TSH: TSH: 2.6 u[IU]/mL (ref 0.350–4.500)

## 2015-12-18 LAB — BASIC METABOLIC PANEL
Anion gap: 11 (ref 5–15)
Anion gap: 8 (ref 5–15)
BUN: 38 mg/dL — AB (ref 6–20)
BUN: 36 mg/dL — AB (ref 6–20)
CALCIUM: 8 mg/dL — AB (ref 8.9–10.3)
CALCIUM: 7.9 mg/dL — AB (ref 8.9–10.3)
CHLORIDE: 105 mmol/L (ref 101–111)
CO2: 17 mmol/L — AB (ref 22–32)
CO2: 17 mmol/L — AB (ref 22–32)
CREATININE: 2 mg/dL — AB (ref 0.44–1.00)
Chloride: 100 mmol/L — ABNORMAL LOW (ref 101–111)
Creatinine, Ser: 2.1 mg/dL — ABNORMAL HIGH (ref 0.44–1.00)
GFR calc Af Amer: 24 mL/min — ABNORMAL LOW (ref >60)
GFR calc non Af Amer: 22 mL/min — ABNORMAL LOW (ref >60)
GFR, EST AFRICAN AMERICAN: 25 mL/min — AB (ref >60)
GFR, EST NON AFRICAN AMERICAN: 21 mL/min — AB (ref >60)
GLUCOSE: 85 mg/dL (ref 65–99)
Glucose, Bld: 172 mg/dL — ABNORMAL HIGH (ref 65–99)
Potassium: 4.8 mmol/L (ref 3.5–5.1)
Potassium: 5.4 mmol/L — ABNORMAL HIGH (ref 3.5–5.1)
SODIUM: 130 mmol/L — AB (ref 135–145)
Sodium: 128 mmol/L — ABNORMAL LOW (ref 135–145)

## 2015-12-18 LAB — CULTURE, RESPIRATORY

## 2015-12-18 LAB — RESPIRATORY VIRUS PANEL
ADENOVIRUS: NEGATIVE
INFLUENZA B 1: POSITIVE — AB
Influenza A: NEGATIVE
Metapneumovirus: NEGATIVE
PARAINFLUENZA 1 A: NEGATIVE
Parainfluenza 2: NEGATIVE
Parainfluenza 3: NEGATIVE
RESPIRATORY SYNCYTIAL VIRUS B: NEGATIVE
RHINOVIRUS: NEGATIVE
Respiratory Syncytial Virus A: NEGATIVE

## 2015-12-18 LAB — T4, FREE: FREE T4: 1.49 ng/dL — AB (ref 0.61–1.12)

## 2015-12-18 MED ORDER — FUROSEMIDE 40 MG PO TABS
80.0000 mg | ORAL_TABLET | Freq: Every day | ORAL | Status: DC
  Administered 2015-12-19 – 2015-12-20 (×2): 80 mg via ORAL
  Filled 2015-12-18 (×2): qty 2

## 2015-12-18 MED ORDER — SODIUM CHLORIDE 1 G PO TABS
1.0000 g | ORAL_TABLET | Freq: Three times a day (TID) | ORAL | Status: DC
  Administered 2015-12-18 – 2015-12-19 (×4): 1 g via ORAL
  Filled 2015-12-18 (×6): qty 1

## 2015-12-18 MED ORDER — SENNA 8.6 MG PO TABS
1.0000 | ORAL_TABLET | Freq: Every day | ORAL | Status: DC
  Administered 2015-12-18 – 2015-12-20 (×3): 8.6 mg via ORAL
  Filled 2015-12-18 (×3): qty 1

## 2015-12-18 NOTE — Consult Note (Addendum)
Renal Service Consult Note [facility]  Theresa Cortez 12/18/2015 [place] Requesting Physician:  Dr. Wynelle Cleveland  Reason for Consult:  REnal failure/ edema/ low Na HPI: The patient is a 82 y.o. year-old with hx of HTN, NIDDM, HL, RA, low T4 and CHF w LVEF 25-30%.  Pt was admitted here Mar 2017 w acute/ chron syst CHF , diuresed 15 L and dc'd on lasix 40/ d po.  CKD 3 w baseline creat 1.6-  1.9 from Mar 2017 (eGFR 27- 33).  DC's to [facility] for rehab.  Returned to ED on 4/5 with nausea/ vomiting, feeling very weak, no diarrhea. She had been on a fluid restriction and lasix at the SNF.  BP's were 80's-  90's on admission.  Was admitted w poss PNA (coughing) and started on IV abx and IVF.  Surg saw her for poss cholecystitis but this was ruled out.  Creat worsened since admit and we are asked to see for a/c renal failure.   Patient reports some trouble swallowing, ? Dysphagia.  +prod cough.  No CP now.  No SOB or orthopnea.  CXR's here all have been negative.    No hx MI, had CVA 2000, no cancer.  Lives in [place], comes to [place] as she used to live here and she returns to see her PCP Dr Marlou Sa here in [place].  No tob / etoh.  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  Her husband died of MI in 19.    Needs help getting OOB here in the hospital.     Chart review: Mar 2017 >  Acute / chron systolic CHF w severe pulm HTN. LVEF 25-30%, mod-severe MR. Diuresed 15 L.  Soft BP's. No ACEi/ ARB due to soft BP's and CKD.  R / L heart cath w no sig CAD and pulm HTN. dc'd on lasix 40 / d. afib / RVR. CKD 3. Cough. Low NA borderline. DM 2 on oral agents.    ROS  denies CP  no joint pain   no HA  no blurry vision  no rash  no diarrhea  no nausea/ vomiting  no dysuria  no difficulty voiding  no change in urine color    Past Medical History  Past Medical History  Diagnosis Date  . Hypertension   . Wound infection ([place])     Right knee   . Hypothyroidism   .  History of diverticulosis   . Hyperlipidemia   . Type II diabetes mellitus ([place])   . History of blood transfusion X 2    w/knee replacement  . Stroke [facility]) 2000    denies residual on 11/23/2015  . Rheumatoid arthritis(714.0)   . DJD (degenerative joint disease)   . Arthritis     "hands, arms" (11/23/2015)  . CKD (chronic kidney disease), stage III   . Chronic combined systolic and diastolic heart failure ([place])   . Atrial fibrillation [facility])    Past Surgical History  Past Surgical History  Procedure Laterality Date  . Knee arthroscopy Right   . Total knee arthroplasty Right 08/07/2011; 2014  . Partial colectomy      descending colon by Dr. Deon Pilling  . Appendectomy    . Medial partial knee replacement Right   . Incision and drainage mouth Right 2014    "knee; took replacement out; put  spacers in"  . Tubal ligation    . Cataract extraction w/ intraocular lens implant Right   . Cardiac  catheterization N/A 11/25/2015    Procedure: Right/Left Heart Cath and Coronary Angiography;  Surgeon: Wellington Hampshire, MD;  Location: [facility];  Service: Cardiovascular;  Laterality: N/A;   Family History  Family History  Problem Relation Age of Onset  . Hypertension Mother    Social History  reports that she has never smoked. She has quit using smokeless tobacco. Her smokeless tobacco use included Snuff. She reports that she does not drink alcohol or use illicit drugs. Allergies  Allergies  Allergen Reactions  . Penicillins Swelling   Home medications Prior to Admission medications   Medication Sig Start Date End Date Taking? Authorizing Provider  amiodarone (PACERONE) 200 MG tablet Take 1 tablet (200 mg total) by mouth 2 (two) times daily. 11/30/15  Yes Thurnell Lose, MD  apixaban (ELIQUIS) 2.5 MG TABS tablet Take 1 tablet (2.5 mg total) by mouth 2 (two) times daily. 11/30/15  Yes Thurnell Lose, MD  atorvastatin (LIPITOR) 10 MG tablet Take 10 mg by mouth daily.    Yes Historical  Provider, MD  diclofenac sodium (VOLTAREN) 1 % GEL Apply 4 g topically 4 (four) times daily.   Yes Historical Provider, MD  folic acid (FOLVITE) 1 MG tablet Take 1 mg by mouth daily.   Yes Historical Provider, MD  furosemide (LASIX) 40 MG tablet Take 1 tablet (40 mg total) by mouth every other day. 12/06/15  Yes Mihai Croitoru, MD  guaiFENesin-codeine (ROBITUSSIN AC) 100-10 MG/5ML syrup Take 10 mLs by mouth at bedtime as needed for cough. 12/06/15  Yes Mihai Croitoru, MD  hydroxychloroquine (PLAQUENIL) 200 MG tablet Take 200 mg by mouth 2 (two) times daily.   Yes Historical Provider, MD  latanoprost (XALATAN) 0.005 % ophthalmic solution Place 1 drop into both eyes at bedtime.   Yes Historical Provider, MD  levothyroxine (SYNTHROID, LEVOTHROID) 75 MCG tablet Take 75 mcg by mouth daily before breakfast.   Yes Historical Provider, MD  methotrexate (RHEUMATREX) 2.5 MG tablet Take 12.5 mg by mouth every Friday. Caution:Chemotherapy. Protect from light. Take 5 tabs by mouth bid every Friday.   Yes Historical Provider, MD  senna (SENOKOT) 8.6 MG tablet Take 1 tablet by mouth 2 (two) times daily.   Yes Historical Provider, MD  sitaGLIPtin (JANUVIA) 50 MG tablet Take 50 mg by mouth daily.   Yes Historical Provider, MD  acetaminophen (TYLENOL) 325 MG tablet Take 650 mg by mouth every 4 (four) hours as needed for moderate pain.    Historical Provider, MD  alum & mag hydroxide-simeth (MAALOX/MYLANTA) 200-200-20 MG/5ML suspension Take 30 mLs by mouth every 6 (six) hours as needed for indigestion.    Historical Provider, MD  benzonatate (TESSALON) 200 MG capsule Take 200 mg by mouth every 8 (eight) hours as needed for cough.    Historical Provider, MD  docusate sodium (COLACE) 100 MG capsule Take 2 capsules (200 mg total) by mouth 2 (two) times daily. Patient not taking: Reported on 12/15/2015 11/30/15   Thurnell Lose, MD  guaifenesin (ROBITUSSIN) 100 MG/5ML syrup Take 200 mg by mouth at bedtime as needed for cough.     Historical Provider, MD  insulin aspart (NOVOLOG) 100 UNIT/ML injection Before each meal 3 times a day, 140-199 - 2 units, 200-250 - 4 units, 251-299 - 6 units,  300-349 - 8 units,  350 or above 10 units. Dispense syringes and needles as needed, Ok to switch to PEN if approved. Substitute to any brand approved. DX DM2, Code E11.65 11/30/15  Thurnell Lose, MD  levofloxacin (LEVAQUIN) 500 MG tablet Take 1 tablet (500 mg total) by mouth daily. For 7 days. Patient not taking: Reported on 12/15/2015 12/06/15   Dani Gobble Croitoru, MD  sodium phosphate (FLEET) enema Place 1 enema rectally once. follow package directions    Historical Provider, MD   Liver Function Tests  Recent Labs Lab 12/12/15 2116 12/15/15 1632 12/16/15 0921  AST 35 99* 110*  ALT 34 75* 91*  ALKPHOS 110 299* 232*  BILITOT 1.0 1.2 1.6*  PROT 6.7 7.3 6.1*  ALBUMIN 2.6* 3.0* 2.6*    Recent Labs Lab 12/15/15 1644  LIPASE 27   CBC  Recent Labs Lab 12/12/15 2116 12/15/15 1632 12/16/15 0921 12/17/15 0318  WBC 7.2 7.3 7.7 7.7  NEUTROABS 5.1 5.6  --   --   HGB 10.2* 11.2* 10.1* 9.7*  HCT 30.5* 33.1* 29.9* 28.4*  MCV 95.0 93.0 94.3 93.7  PLT 310 306 288 409   Basic Metabolic Panel  Recent Labs Lab 12/15/15 1632 12/16/15 0921 12/16/15 1715 12/17/15 0318 12/17/15 1503 12/18/15 0533 12/18/15 1620  NA 125* 130* 128* 126* 128* 128* 130*  K 6.1* 5.7* 5.4* 5.1 5.4* 4.8 5.4*  CL 92* 95* 96* 97* 100* 100* 105  CO2 22 20* 21* 19* 16* 17* 17*  GLUCOSE 122* 101* 80 108* 130* 85 172*  BUN 28* 34* 36* 35* 39* 38* 36*  CREATININE 2.19* 2.32* 2.36* 2.17* 2.20* 2.10* 2.00*  CALCIUM 8.9 8.6* 8.4* 8.0* 8.3* 8.0* 7.9*    Filed Vitals:   12/17/15 1422 12/17/15 2009 12/18/15 0650 12/18/15 1425  BP: 93/49 101/61 99/56 113/64  Pulse: 70 73 69 74  Temp: 97.9 F (36.6 C) 97.9 F (36.6 C) 97.3 F (36.3 C) 97.3 F (36.3 C)  TempSrc: Axillary Oral Oral Oral  Resp: _0 Height:      Weight:   84.3 kg (185 lb 13.6  oz)   SpO2: 100% 99% 96% 98%   Exam Alert no distress, calm, elderly AAF No rash, cyanosis or gangrene Sclera anicteric, throat clear  +JVD Chest occ faint crackles L base, o/w clear RRR no MRG, sustained PMI dyskinetic Abd soft ntnd no mass or ascites +bs  GU defer MS no joint effusions or deformity  Ext 2+ doughy bilat LE edema, mostly upper legs and hips / no wounds or ulcers Neuro is alert, Ox 3 , nf  UA - neg protein, no rbc/ wbc, clear CXR - no active disease  Assessment: 1. CKD stage 3/4 - renal function worsening over last month or two. 2. Vol excess - LE edema, no pulm edema. Has been getting IVF's.  BP's are soft 3. BP - no BP meds, BP's soft 4. Systolic CHF - LVEF 73-53% w marked pulm HTN and RV dilatation last echo. On exam excess volume is R sided 5. Debility - was in SNF after recent host stay in March 6. DM2 - not on insulin at home 7. Hx CVA 8. Hx RA 9. Hx afib 10. Hyponatremia - this is chronic and not an issue at this time 11. Hyperkalemia - avoid ACEi/ ARB, and ordered renal diet. Diet education.    Plan - would stop IVF"s as she is accumulating sig edema in the LE's. Resume maintenance po lasix but would increase dose to 80 mg qd or bid given she is now stage 4 CKD.  Will start 80 po daily.  She will likely be difficult to manage with cardiorenal picture.Marland Kitchen  Have d/w daughter and patient, recommended she should have renal f/u after discharge.  Marginal HD candidate , this was discussed with pt/ daughter as well, not an issue at this time though.   Kelly Splinter MD [employer] pager [phone]    cell [phone] 12/18/2015, 5:34 PM

## 2015-12-18 NOTE — Progress Notes (Signed)
[facility] Progress Note   Theresa Cortez  ERD:[id number]  DOB: 1930-07-22  DOA: 12/15/2015 PCP: Gwenyth Bender, MD  Brief narrative: Theresa Cortez is a 82 y.o. 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  Noted to have electrolyte abnormalities with Ns of 125, K of 6.1 and AKI with Cr 2.19 and lactic acidosis 3.9. LFTS were mildly elevated and due to this and vomiting, CT abd/pelvis was done- ? Gallbladder thickening On 4/5 she had an episode of jerking followed by unresponsiveness for 1 min prior to becoming alert again. Last admission 3/13-3/21 for CHF  Subjective: Cough better after cough medicine given. No nausea or vomiting. No abdominal pain. Last BM day before yesterday.   Assessment/Plan: Principal Problem:  Influenza B - found to be + for this today- symptoms are mild- cough is chronic- will not give Tamiflu  Vomiting - with mildly elevated LFTs which are actually better than last admission  Abd ultrasound also showed thickening and pericholecystic fluid. HIDA was done and found to be negative. - HIDA negative- started diet & stopped antibiotics 4/6 - no further GI issues - may have been related to Influenza?  Hyponatremia/ hyperkalemia/ AKI on CKD3/  lactic acidosis - due to diuretics and vomiting?   - despite hydration sodium and renal function not improving- have asked for nephrology assitance - may need to decrease dose of Lasix on discharge- cardiology recommends 20 mg daily - hold Januvia (AKI is side effect)    Hypotension/   Chronic combined systolic and diastolic heart failure - hypotension improved with fluid  boluses-   - EF 25-30% with severe MR- BPs were low during last admission and therefore she was not given ACE/ARB, Hydralazine/ Imdur - see above in regards to fluid status  Uresponsive episode -on 4/5-  possible seizure- not surprising with her electrolyte abnormalities- no further work up for now  Cough -not new issue- had this problem when last discharged- she had 3 rounds of antibiotics for this as outpt-no post nasal drip-  supportive care    Diabetes mellitus, type 2 - hold Januvia, low dose ISS while following PO intake     Hypothyroidism - synthroid    RA (rheumatoid arthritis) - Plaquenil - takes Methotrexate on Fridays - hold due to elevated LFTs    Chronic atrial fibrillation - Eliquis + Amiodarone- cardiology stopped Amiodarone 4/7 due to elevated LFTs  Mildly elevated LFTs - holding Amiodarone, Methotrexate    Adnexal cyst - left - chronic    Protein-calorie malnutrition, moderate        Antibiotics: Anti-infectives    Start     Dose/Rate Route Frequency Ordered Stop   12/17/15 2200  levofloxacin (LEVAQUIN) IVPB 750 mg  Status:  Discontinued     750 mg 100 mL/hr over 90 Minutes Intravenous Every 48 hours 12/15/15 1952 12/16/15 1742   12/16/15 2200  vancomycin (VANCOCIN) IVPB 750 mg/150 ml premix  Status:  Discontinued     750 mg 150 mL/hr over 60 Minutes Intravenous Every 24 hours 12/15/15 1959 12/16/15 1742   12/16/15 0600  aztreonam (AZACTAM) 1 g in dextrose 5 % 50 mL IVPB  Status:  Discontinued     1 g 100 mL/hr over 30 Minutes Intravenous 3 times per  day 12/15/15 1950 12/16/15 1742   12/15/15 2200  hydroxychloroquine (PLAQUENIL) tablet 200 mg     200 mg Oral 2 times daily 12/15/15 2001     12/15/15 1945  levofloxacin (LEVAQUIN) IVPB 750 mg     750 mg 100 mL/hr over 90 Minutes Intravenous  Once 12/15/15 1935 12/16/15 0005   12/15/15 1945  aztreonam (AZACTAM) 2 g in dextrose 5 % 50 mL IVPB     2 g 100 mL/hr over 30 Minutes Intravenous  Once  12/15/15 1935 12/15/15 2056   12/15/15 1945  vancomycin (VANCOCIN) IVPB 1000 mg/200 mL premix     1,000 mg 200 mL/hr over 60 Minutes Intravenous  Once 12/15/15 1935 12/15/15 2129     Code Status:     Code Status Orders        Start     Ordered   12/15/15 2004  Full code   Continuous     12/15/15 2004    Code Status History    Date Active Date Inactive Code Status Order ID Comments User Context   11/22/2015 11:46 PM 11/30/2015  2:13 PM Full Code [id number]  Clydie Braun, MD ED    Advance Directive Documentation        Most Recent Value   Type of Advance Directive  Healthcare Power of Attorney   Pre-existing out of facility DNR order (yellow form or pink MOST form)     "MOST" Form in Place?       Family Communication: daughter at bedside Disposition Plan: return to SNF- tx to tele DVT prophylaxis: Eliquis Consultants: gen surgery, cardiology Procedures: see imaging below    Objective: Filed Weights   12/15/15 2112 12/17/15 0551 12/18/15 0650  Weight: 83 kg (182 lb 15.7 oz) 85.5 kg (188 lb 7.9 oz) 84.3 kg (185 lb 13.6 oz)    Intake/Output Summary (Last 24 hours) at 12/18/15 1230 Last data filed at 12/18/15 0935  Gross per 24 hour  Intake   2360 ml  Output    350 ml  Net   2010 ml     Vitals Filed Vitals:   12/17/15 1125 12/17/15 1422 12/17/15 2009 12/18/15 0650  BP: 106/83 93/49 101/61 99/56  Pulse: 68 70 73 69  Temp: 97.6 F (36.4 C) 97.9 F (36.6 C) 97.9 F (36.6 C) 97.3 F (36.3 C)  TempSrc: Oral Axillary Oral Oral  Resp: 20 18 18 18   Height:      Weight:    84.3 kg (185 lb 13.6 oz)  SpO2: 99% 100% 99% 96%    Exam:  General:  Pt is alert, not in acute distress  HEENT: No icterus, No thrush, oral mucosa moist  Cardiovascular: regular rate and rhythm, S1/S2 No murmur  Respiratory: clear to auscultation bilaterally   Abdomen: Soft, +Bowel sounds, non tender, non distended, no guarding  MSK: No cyanosis or clubbing- no pedal edema   Data  Reviewed: Basic Metabolic Panel:  Recent Labs Lab 12/16/15 0921 12/16/15 1715 12/17/15 0318 12/17/15 1503 12/18/15 0533  NA 130* 128* 126* 128* 128*  K 5.7* 5.4* 5.1 5.4* 4.8  CL 95* 96* 97* 100* 100*  CO2 20* 21* 19* 16* 17*  GLUCOSE 101* 80 108* 130* 85  BUN 34* 36* 35* 39* 38*  CREATININE 2.32* 2.36* 2.17* 2.20* 2.10*  CALCIUM 8.6* 8.4* 8.0* 8.3* 8.0*  MG  --   --  1.9  --   --    Liver Function Tests:  Recent Labs Lab 12/12/15  2116 12/15/15 1632 12/16/15 0921  AST 35 99* 110*  ALT 34 75* 91*  ALKPHOS 110 299* 232*  BILITOT 1.0 1.2 1.6*  PROT 6.7 7.3 6.1*  ALBUMIN 2.6* 3.0* 2.6*    Recent Labs Lab 12/15/15 1644  LIPASE 27   No results for input(s): AMMONIA in the last 168 hours. CBC:  Recent Labs Lab 12/12/15 2116 12/15/15 1632 12/16/15 0921 12/17/15 0318  WBC 7.2 7.3 7.7 7.7  NEUTROABS 5.1 5.6  --   --   HGB 10.2* 11.2* 10.1* 9.7*  HCT 30.5* 33.1* 29.9* 28.4*  MCV 95.0 93.0 94.3 93.7  PLT 310 306 288 269   Cardiac Enzymes:  Recent Labs Lab 12/15/15 1632  TROPONINI <0.03   BNP (last 3 results)  Recent Labs  11/22/15 2052 12/16/15 0113  BNP 1732.2* 1426.5*    ProBNP (last 3 results) No results for input(s): PROBNP in the last 8760 hours.  CBG:  Recent Labs Lab 12/17/15 0826 12/17/15 1130 12/17/15 1644 12/17/15 2134 12/18/15 0751  GLUCAP 81 119* 131* 130* 78    Recent Results (from the past 240 hour(s))  Culture, blood (routine x 2)     Status: None (Preliminary result)   Collection Time: 12/15/15  4:48 PM  Result Value Ref Range Status   Specimen Description BLOOD RIGHT ARM  Final   Special Requests BOTTLES DRAWN AEROBIC AND ANAEROBIC 5CC  Final   Culture   Final    NO GROWTH 3 DAYS Performed at [facility]    Report Status PENDING  Incomplete  MRSA PCR Screening     Status: None   Collection Time: 12/15/15 10:12 PM  Result Value Ref Range Status   MRSA by PCR NEGATIVE NEGATIVE Final    Comment:         The GeneXpert MRSA Assay (FDA approved for NASAL specimens only), is one component of a comprehensive MRSA colonization surveillance program. It is not intended to diagnose MRSA infection nor to guide or monitor treatment for MRSA infections.   Respiratory virus panel     Status: Abnormal   Collection Time: 12/16/15  1:04 AM  Result Value Ref Range Status   Respiratory Syncytial Virus A Negative Negative Final   Respiratory Syncytial Virus B Negative Negative Final   Influenza A Negative Negative Final   Influenza B Positive (A) Negative Final   Parainfluenza 1 Negative Negative Final   Parainfluenza 2 Negative Negative Final   Parainfluenza 3 Negative Negative Final   Metapneumovirus Negative Negative Final   Rhinovirus Negative Negative Final   Adenovirus Negative Negative Final    Comment: (NOTE) Performed At: [facility] [address] [place], [place] [id number] Mila Homer MD QI:[id number]   Culture, sputum-assessment     Status: None   Collection Time: 12/16/15  4:08 AM  Result Value Ref Range Status   Specimen Description SPUTUM  Final   Special Requests NONE  Final   Sputum evaluation   Final    THIS SPECIMEN IS ACCEPTABLE. RESPIRATORY CULTURE REPORT TO FOLLOW.   Report Status 12/16/2015 FINAL  Final  Culture, respiratory (NON-Expectorated)     Status: None   Collection Time: 12/16/15  4:08 AM  Result Value Ref Range Status   Specimen Description SPUTUM  Final   Special Requests NONE  Final   Gram Stain   Final    FEW WBC PRESENT, PREDOMINANTLY PMN MODERATE SQUAMOUS EPITHELIAL CELLS PRESENT ABUNDANT GRAM POSITIVE COCCI IN PAIRS IN CHAINS FEW GRAM POSITIVE RODS  Performed at [facility]   Final    NORMAL OROPHARYNGEAL FLORA Performed at [facility]    Report Status 12/18/2015 FINAL  Final  Urine culture     Status: None   Collection Time: 12/16/15  3:55 PM  Result Value Ref Range Status   Specimen Description  URINE, CATHETERIZED  Final   Special Requests NONE  Final   Culture   Final    NO GROWTH 1 DAY Performed at [facility]    Report Status 12/17/2015 FINAL  Final     Studies: Nm Hepatobiliary Including Gb  12/16/2015  CLINICAL DATA:  Right upper quadrant abdominal pain for 1 month. EXAM: NUCLEAR MEDICINE HEPATOBILIARY IMAGING TECHNIQUE: Sequential images of the abdomen were obtained out to 60 minutes following intravenous administration of radiopharmaceutical. RADIOPHARMACEUTICALS:  5.2 mCi Tc-49m  Choletec IV COMPARISON:  CT scan of December 12, 2015. FINDINGS: Prompt uptake and biliary excretion of activity by the liver is seen. Gallbladder activity is visualized, consistent with patency of cystic duct. Biliary activity passes into small bowel, consistent with patent common bile duct. IMPRESSION: Normal uptake is seen within the gallbladder.  No abnormality seen. Electronically Signed   By: Lupita Raider, M.D.   On: 12/16/2015 13:09   Dg Chest Port 1 View  12/17/2015  CLINICAL DATA:  Pulmonary infiltrate. EXAM: PORTABLE CHEST 1 VIEW COMPARISON:  12/15/2015. FINDINGS: Mediastinum and hilar structures normal. Cardiomegaly with normal pulmonary vascularity. Interim clearing of right upper lobe infiltrate. Mild basilar atelectasis. Small pleural effusions cannot be excluded . IMPRESSION: 1. Interim clearing of right upper lobe infiltrate. Persistent mild basilar atelectasis and small bilateral pleural effusions. 2.  Stable cardiomegaly. Electronically Signed   By: Maisie Fus  Register   On: 12/17/2015 07:16    Scheduled Meds:  Scheduled Meds: . apixaban  2.5 mg Oral BID  . atorvastatin  10 mg Oral Daily  . folic acid  1 mg Oral Daily  . hydroxychloroquine  200 mg Oral BID  . insulin aspart  0-5 Units Subcutaneous QHS  . insulin aspart  0-9 Units Subcutaneous TID WC  . latanoprost  1 drop Both Eyes QHS  . levothyroxine  75 mcg Oral QAC breakfast  . senna  1 tablet Oral Daily  . sodium  chloride  1 g Oral TID WC   Continuous Infusions: . sodium chloride 125 mL/hr at 12/18/15 0532    Time spent on care of this patient: 35  min   Lona Six, MD 12/18/2015, 12:30 PM  LOS: 3 days   [facility] Office  [phone] Pager - Text Page per [email or web address] If 7PM-7AM, please contact night-coverage [email or web address]

## 2015-12-19 DIAGNOSIS — R1084 Generalized abdominal pain: Secondary | ICD-10-CM

## 2015-12-19 LAB — COMPREHENSIVE METABOLIC PANEL
ALBUMIN: 2.2 g/dL — AB (ref 3.5–5.0)
ALT: 263 U/L — AB (ref 14–54)
AST: 266 U/L — AB (ref 15–41)
Alkaline Phosphatase: 259 U/L — ABNORMAL HIGH (ref 38–126)
Anion gap: 7 (ref 5–15)
BUN: 32 mg/dL — AB (ref 6–20)
CHLORIDE: 107 mmol/L (ref 101–111)
CO2: 21 mmol/L — AB (ref 22–32)
CREATININE: 1.83 mg/dL — AB (ref 0.44–1.00)
Calcium: 8.4 mg/dL — ABNORMAL LOW (ref 8.9–10.3)
GFR calc Af Amer: 28 mL/min — ABNORMAL LOW (ref >60)
GFR calc non Af Amer: 24 mL/min — ABNORMAL LOW (ref >60)
GLUCOSE: 77 mg/dL (ref 65–99)
Potassium: 4.7 mmol/L (ref 3.5–5.1)
SODIUM: 135 mmol/L (ref 135–145)
TOTAL PROTEIN: 5.5 g/dL — AB (ref 6.5–8.1)
Total Bilirubin: 0.5 mg/dL (ref 0.3–1.2)

## 2015-12-19 LAB — GLUCOSE, CAPILLARY
GLUCOSE-CAPILLARY: 74 mg/dL (ref 65–99)
GLUCOSE-CAPILLARY: 81 mg/dL (ref 65–99)
GLUCOSE-CAPILLARY: 159 mg/dL — AB (ref 65–99)
GLUCOSE-CAPILLARY: 99 mg/dL (ref 65–99)

## 2015-12-19 MED ORDER — POLYETHYLENE GLYCOL 3350 17 G PO PACK
17.0000 g | PACK | Freq: Every day | ORAL | Status: DC
  Administered 2015-12-19 – 2015-12-20 (×2): 17 g via ORAL
  Filled 2015-12-19 (×2): qty 1

## 2015-12-19 NOTE — Progress Notes (Addendum)
[facility] Progress Note   Theresa Cortez  ZOX:[id number]  DOB: 1930/04/17  DOA: 12/15/2015 PCP: Gwenyth Bender, MD  Brief narrative: Theresa Cortez is a 82 y.o. 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  Noted to have electrolyte abnormalities with Ns of 125, K of 6.1 and AKI with Cr 2.19 and lactic acidosis 3.9. LFTS were mildly elevated and due to this and vomiting, CT abd/pelvis was done- ? Gallbladder thickening On 4/5 she had an episode of jerking followed by unresponsiveness for 1 min prior to becoming alert again. Last admission 3/13-3/21 for CHF  Subjective: No new complaints  Assessment/Plan: Principal Problem:  Influenza B - found to be + for this on 4/8- symptoms are mild- cough is chronic- will not give Tamiflu  Vomiting - with mildly elevated LFTs which are actually better than last admission  Abd ultrasound also showed thickening and pericholecystic fluid. HIDA was done and found to be negative. - HIDA negative- started diet & stopped antibiotics 4/6 - no further GI issues - may have been related to Influenza?  Hyponatremia/ hyperkalemia/ AKI on CKD3/  lactic acidosis - due to diuretics and vomiting? Initially dehydrated. Now fluid overloaded - BUN/Cr, sodium improved with Lasix- & with NaCl tabs- stop NaCL tabs today- cont Lasix- recheck labs in AM- hopefully back [facility] - hold Januvia (AKI is side effect) - out pt f/u with Dr Lacy Duverney    Hypotension/   Chronic combined systolic and diastolic heart failure - hypotension improved with fluid boluses-   - EF 25-30% with severe MR- BPs were low during last  admission and therefore she was not given ACE/ARB, Hydralazine/ Imdur - see above in regards to fluid status  Uresponsive episode -on 4/5-  possible seizure- not surprising with her electrolyte abnormalities- no further work up for now  Cough -not new issue- had this problem when last discharged- she had 3 rounds of antibiotics for this as outpt-no post nasal drip-  supportive care    Diabetes mellitus, type 2 - hold Januvia, low dose ISS while following PO intake     Hypothyroidism TSH 2.6, F T 4 1.49 - F t4 slightly elevated- will not adjust Synthroid at this time    RA (rheumatoid arthritis) - Plaquenil - takes Methotrexate on Fridays - hold due to elevated LFTs    Chronic atrial fibrillation - Eliquis + Amiodarone- cardiology stopped Amiodarone 4/7 due to elevated LFTs  Elevated LFTs - holding Amiodarone, Methotrexate    Adnexal cyst - left - chronic    Protein-calorie malnutrition, moderate        Antibiotics: Anti-infectives    Start     Dose/Rate Route Frequency Ordered Stop   12/17/15 2200  levofloxacin (LEVAQUIN) IVPB 750 mg  Status:  Discontinued     750 mg 100 mL/hr over 90 Minutes Intravenous Every 48 hours 12/15/15 1952 12/16/15 1742   12/16/15 2200  vancomycin (VANCOCIN) IVPB 750 mg/150 ml premix  Status:  Discontinued     750 mg 150 mL/hr over 60 Minutes Intravenous Every 24 hours 12/15/15 1959 12/16/15 1742   12/16/15 0600  aztreonam (AZACTAM) 1 g in dextrose 5 % 50 mL IVPB  Status:  Discontinued     1 g 100 mL/hr over 30  Minutes Intravenous 3 times per day 12/15/15 1950 12/16/15 1742   12/15/15 2200  hydroxychloroquine (PLAQUENIL) tablet 200 mg     200 mg Oral 2 times daily 12/15/15 2001     12/15/15 1945  levofloxacin (LEVAQUIN) IVPB 750 mg     750 mg 100 mL/hr over 90 Minutes Intravenous  Once 12/15/15 1935 12/16/15 0005   12/15/15 1945  aztreonam (AZACTAM) 2 g in dextrose 5 % 50 mL IVPB     2 g 100 mL/hr over 30 Minutes Intravenous  Once  12/15/15 1935 12/15/15 2056   12/15/15 1945  vancomycin (VANCOCIN) IVPB 1000 mg/200 mL premix     1,000 mg 200 mL/hr over 60 Minutes Intravenous  Once 12/15/15 1935 12/15/15 2129     Code Status:     Code Status Orders        Start     Ordered   12/15/15 2004  Full code   Continuous     12/15/15 2004    Code Status History    Date Active Date Inactive Code Status Order ID Comments User Context   11/22/2015 11:46 PM 11/30/2015  2:13 PM Full Code [id number]  Clydie Braun, MD ED    Advance Directive Documentation        Most Recent Value   Type of Advance Directive  Healthcare Power of Attorney   Pre-existing out of facility DNR order (yellow form or pink MOST form)     "MOST" Form in Place?       Family Communication: daughter at bedside Disposition Plan: return to SNF  DVT prophylaxis: Eliquis Consultants: gen surgery, cardiology, nephrology Procedures: see imaging below    Objective: Filed Weights   12/17/15 0551 12/18/15 0650 12/19/15 0544  Weight: 85.5 kg (188 lb 7.9 oz) 84.3 kg (185 lb 13.6 oz) 86.3 kg (190 lb 4.1 oz)    Intake/Output Summary (Last 24 hours) at 12/19/15 1157 Last data filed at 12/19/15 0900  Gross per 24 hour  Intake    600 ml  Output   1325 ml  Net   -725 ml     Vitals Filed Vitals:   12/18/15 0650 12/18/15 1425 12/18/15 2015 12/19/15 0544  BP: 99/56 113/64 93/69 96/74   Pulse: 69 74 79 71  Temp: 97.3 F (36.3 C) 97.3 F (36.3 C) 98.2 F (36.8 C) 97.5 F (36.4 C)  TempSrc: Oral Oral Oral Oral  Resp: 18 20 24 20   Height:      Weight: 84.3 kg (185 lb 13.6 oz)   86.3 kg (190 lb 4.1 oz)  SpO2: 96% 98% 99% 100%    Exam:  General:  Pt is alert, not in acute distress  HEENT: No icterus, No thrush, oral mucosa moist  Cardiovascular: regular rate and rhythm, S1/S2 No murmur  Respiratory: clear to auscultation bilaterally   Abdomen: Soft, +Bowel sounds, non tender, non distended, no guarding  MSK: No cyanosis or clubbing- no  pedal edema   Data Reviewed: Basic Metabolic Panel:  Recent Labs Lab 12/17/15 0318 12/17/15 1503 12/18/15 0533 12/18/15 1620 12/19/15 0536  NA 126* 128* 128* 130* 135  K 5.1 5.4* 4.8 5.4* 4.7  CL 97* 100* 100* 105 107  CO2 19* 16* 17* 17* 21*  GLUCOSE 108* 130* 85 172* 77  BUN 35* 39* 38* 36* 32*  CREATININE 2.17* 2.20* 2.10* 2.00* 1.83*  CALCIUM 8.0* 8.3* 8.0* 7.9* 8.4*  MG 1.9  --   --   --   --  Liver Function Tests:  Recent Labs Lab 12/12/15 2116 12/15/15 1632 12/16/15 0921 12/19/15 0536  AST 35 99* 110* 266*  ALT 34 75* 91* 263*  ALKPHOS 110 299* 232* 259*  BILITOT 1.0 1.2 1.6* 0.5  PROT 6.7 7.3 6.1* 5.5*  ALBUMIN 2.6* 3.0* 2.6* 2.2*    Recent Labs Lab 12/15/15 1644  LIPASE 27   No results for input(s): AMMONIA in the last 168 hours. CBC:  Recent Labs Lab 12/12/15 2116 12/15/15 1632 12/16/15 0921 12/17/15 0318  WBC 7.2 7.3 7.7 7.7  NEUTROABS 5.1 5.6  --   --   HGB 10.2* 11.2* 10.1* 9.7*  HCT 30.5* 33.1* 29.9* 28.4*  MCV 95.0 93.0 94.3 93.7  PLT 310 306 288 269   Cardiac Enzymes:  Recent Labs Lab 12/15/15 1632  TROPONINI <0.03   BNP (last 3 results)  Recent Labs  11/22/15 2052 12/16/15 0113  BNP 1732.2* 1426.5*    ProBNP (last 3 results) No results for input(s): PROBNP in the last 8760 hours.  CBG:  Recent Labs Lab 12/18/15 1213 12/18/15 1735 12/18/15 2140 12/19/15 0748 12/19/15 1143  GLUCAP 114* 144* 91 74 81    Recent Results (from the past 240 hour(s))  Culture, blood (routine x 2)     Status: None (Preliminary result)   Collection Time: 12/15/15  4:48 PM  Result Value Ref Range Status   Specimen Description BLOOD RIGHT ARM  Final   Special Requests BOTTLES DRAWN AEROBIC AND ANAEROBIC 5CC  Final   Culture   Final    NO GROWTH 3 DAYS Performed at [facility]    Report Status PENDING  Incomplete  MRSA PCR Screening     Status: None   Collection Time: 12/15/15 10:12 PM  Result Value Ref Range Status    MRSA by PCR NEGATIVE NEGATIVE Final    Comment:        The GeneXpert MRSA Assay (FDA approved for NASAL specimens only), is one component of a comprehensive MRSA colonization surveillance program. It is not intended to diagnose MRSA infection nor to guide or monitor treatment for MRSA infections.   Respiratory virus panel     Status: Abnormal   Collection Time: 12/16/15  1:04 AM  Result Value Ref Range Status   Respiratory Syncytial Virus A Negative Negative Final   Respiratory Syncytial Virus B Negative Negative Final   Influenza A Negative Negative Final   Influenza B Positive (A) Negative Final   Parainfluenza 1 Negative Negative Final   Parainfluenza 2 Negative Negative Final   Parainfluenza 3 Negative Negative Final   Metapneumovirus Negative Negative Final   Rhinovirus Negative Negative Final   Adenovirus Negative Negative Final    Comment: (NOTE) Performed At: [facility] [address] [place], [place] [id number] Mila Homer MD AS:[id number]   Culture, sputum-assessment     Status: None   Collection Time: 12/16/15  4:08 AM  Result Value Ref Range Status   Specimen Description SPUTUM  Final   Special Requests NONE  Final   Sputum evaluation   Final    THIS SPECIMEN IS ACCEPTABLE. RESPIRATORY CULTURE REPORT TO FOLLOW.   Report Status 12/16/2015 FINAL  Final  Culture, respiratory (NON-Expectorated)     Status: None   Collection Time: 12/16/15  4:08 AM  Result Value Ref Range Status   Specimen Description SPUTUM  Final   Special Requests NONE  Final   Gram Stain   Final    FEW WBC PRESENT, PREDOMINANTLY PMN MODERATE  SQUAMOUS EPITHELIAL CELLS PRESENT ABUNDANT GRAM POSITIVE COCCI IN PAIRS IN CHAINS FEW GRAM POSITIVE RODS Performed at [facility]    Culture   Final    NORMAL OROPHARYNGEAL FLORA Performed at [facility]    Report Status 12/18/2015 FINAL  Final  Urine culture     Status: None   Collection Time: 12/16/15  3:55  PM  Result Value Ref Range Status   Specimen Description URINE, CATHETERIZED  Final   Special Requests NONE  Final   Culture   Final    NO GROWTH 1 DAY Performed at [facility]    Report Status 12/17/2015 FINAL  Final     Studies: No results found.  Scheduled Meds:  Scheduled Meds: . apixaban  2.5 mg Oral BID  . atorvastatin  10 mg Oral Daily  . folic acid  1 mg Oral Daily  . furosemide  80 mg Oral Daily  . hydroxychloroquine  200 mg Oral BID  . insulin aspart  0-5 Units Subcutaneous QHS  . insulin aspart  0-9 Units Subcutaneous TID WC  . latanoprost  1 drop Both Eyes QHS  . levothyroxine  75 mcg Oral QAC breakfast  . senna  1 tablet Oral Daily   Continuous Infusions:    Time spent on care of this patient: 35  min   Miachel Nardelli, MD 12/19/2015, 11:57 AM  LOS: 4 days   [facility] Office  [phone] Pager - Text Page per [email or web address] If 7PM-7AM, please contact night-coverage [email or web address]

## 2015-12-19 NOTE — Progress Notes (Signed)
Patient discussed with Dr. Butler Denmark- not stable for d/c today. Plan d/c back to [facility] when medically stable per MD.  Lupita Leash T. Jaci Laz[place], [place] [phone] (weekend coverage)

## 2015-12-19 NOTE — Progress Notes (Addendum)
[facility] Progress Note   Subjective: 1.3 L out yest. Pt w/o complaints, cough better after cough syrup.   Filed Vitals:   12/18/15 0650 12/18/15 1425 12/18/15 2015 12/19/15 0544  BP: 99/56 113/64 93/69 96/74   Pulse: 69 74 79 71  Temp: 97.3 F (36.3 C) 97.3 F (36.3 C) 98.2 F (36.8 C) 97.5 F (36.4 C)  TempSrc: Oral Oral Oral Oral  Resp: 18 20 24 20   Height:      Weight: 84.3 kg (185 lb 13.6 oz)   86.3 kg (190 lb 4.1 oz)  SpO2: 96% 98% 99% 100%    Inpatient medications: . apixaban  2.5 mg Oral BID  . atorvastatin  10 mg Oral Daily  . folic acid  1 mg Oral Daily  . furosemide  80 mg Oral Daily  . hydroxychloroquine  200 mg Oral BID  . insulin aspart  0-5 Units Subcutaneous QHS  . insulin aspart  0-9 Units Subcutaneous TID WC  . latanoprost  1 drop Both Eyes QHS  . levothyroxine  75 mcg Oral QAC breakfast  . senna  1 tablet Oral Daily  . sodium chloride  1 g Oral TID WC     albuterol, alum & mag hydroxide-simeth, benzonatate, guaiFENesin-codeine, morphine injection, ondansetron  Exam: Alert no distress No jvd Chest occ crackles L base  RRR no MRG, sustained PMI dyskinetic Abd soft ntnd no mass or ascites +bs  GU defer MS no joint effusions or deformity  Ext 2+ doughy bilat LE edema Neuro is alert, Ox 3 , nf  UA - neg protein, no rbc/ wbc, clear CXR - no active disease  Assessment: 1. CKD stage 4 - HTN and/or cardiorenal. Renal fxn stable. Needs renal f/u after dc  2. Vol excess - +LE edema, no pulm edema 3. BP - no BP meds, BP's soft 4. Systolic CHF - LVEF 25-30% w marked pulm HTN and RV dilatation last echo 5. Debility - was in SNF after recent host stay in March 6. DM2 - not on insulin at home 7. Hx CVA 8. Hx RA 9. Hx afib 10. Hyponatremia - hypervolemic, better today.  DC'd salt tabs.  11. Hyperkalemia - avoid ACEi/ ARB, and ordered renal diet. Diet education.  Plan - cont po lasix at [facility] dose for progressive CKD 80/d for now.   Adjust as needed for edema.  Discussed renal f/u with daughter/ pt , they will see the kidney doctor in [place] where she lives (Dr. Aglantzia ([place])) after dc.  No other suggestions. Will sign off.    Marden Noble MD [facility] pager [phone]    cell [phone] 12/19/2015, 8:11 AM    Recent Labs Lab 12/18/15 0533 12/18/15 1620 12/19/15 0536  NA 128* 130* 135  K 4.8 5.4* 4.7  CL 100* 105 107  CO2 17* 17* 21*  GLUCOSE 85 172* 77  BUN 38* 36* 32*  CREATININE 2.10* 2.00* 1.83*  CALCIUM 8.0* 7.9* 8.4*    Recent Labs Lab 12/15/15 1632 12/16/15 0921 12/19/15 0536  AST 99* 110* 266*  ALT 75* 91* 263*  ALKPHOS 299* 232* 259*  BILITOT 1.2 1.6* 0.5  PROT 7.3 6.1* 5.5*  ALBUMIN 3.0* 2.6* 2.2*    Recent Labs Lab 12/12/15 2116 12/15/15 1632 12/16/15 0921 12/17/15 0318  WBC 7.2 7.3 7.7 7.7  NEUTROABS 5.1 5.6  --   --   HGB 10.2* 11.2* 10.1* 9.7*  HCT 30.5* 33.1* 29.9* 28.4*  MCV 95.0 93.0 94.3 93.7  PLT  310 306 288 269      

## 2015-12-20 DIAGNOSIS — E039 Hypothyroidism, unspecified: Secondary | ICD-10-CM

## 2015-12-20 DIAGNOSIS — N949 Unspecified condition associated with female genital organs and menstrual cycle: Secondary | ICD-10-CM

## 2015-12-20 DIAGNOSIS — G43A0 Cyclical vomiting, not intractable: Secondary | ICD-10-CM

## 2015-12-20 DIAGNOSIS — E8779 Other fluid overload: Secondary | ICD-10-CM

## 2015-12-20 DIAGNOSIS — I5042 Chronic combined systolic (congestive) and diastolic (congestive) heart failure: Secondary | ICD-10-CM

## 2015-12-20 DIAGNOSIS — M069 Rheumatoid arthritis, unspecified: Secondary | ICD-10-CM

## 2015-12-20 DIAGNOSIS — J101 Influenza due to other identified influenza virus with other respiratory manifestations: Secondary | ICD-10-CM

## 2015-12-20 DIAGNOSIS — I071 Rheumatic tricuspid insufficiency: Secondary | ICD-10-CM

## 2015-12-20 DIAGNOSIS — R112 Nausea with vomiting, unspecified: Secondary | ICD-10-CM

## 2015-12-20 DIAGNOSIS — E8770 Fluid overload, unspecified: Secondary | ICD-10-CM

## 2015-12-20 LAB — GLUCOSE, CAPILLARY
Glucose-Capillary: 75 mg/dL (ref 65–99)
Glucose-Capillary: 155 mg/dL — ABNORMAL HIGH (ref 65–99)

## 2015-12-20 LAB — COMPREHENSIVE METABOLIC PANEL
ALK PHOS: 216 U/L — AB (ref 38–126)
ALT: 205 U/L — ABNORMAL HIGH (ref 14–54)
ANION GAP: 8 (ref 5–15)
AST: 149 U/L — ABNORMAL HIGH (ref 15–41)
Albumin: 2.2 g/dL — ABNORMAL LOW (ref 3.5–5.0)
BUN: 29 mg/dL — ABNORMAL HIGH (ref 6–20)
CALCIUM: 8.7 mg/dL — AB (ref 8.9–10.3)
CO2: 22 mmol/L (ref 22–32)
Chloride: 108 mmol/L (ref 101–111)
Creatinine, Ser: 1.51 mg/dL — ABNORMAL HIGH (ref 0.44–1.00)
GFR calc non Af Amer: 31 mL/min — ABNORMAL LOW (ref >60)
GFR, EST AFRICAN AMERICAN: 36 mL/min — AB (ref >60)
Glucose, Bld: 97 mg/dL (ref 65–99)
Potassium: 4.3 mmol/L (ref 3.5–5.1)
SODIUM: 138 mmol/L (ref 135–145)
Total Bilirubin: 0.9 mg/dL (ref 0.3–1.2)
Total Protein: 5.4 g/dL — ABNORMAL LOW (ref 6.5–8.1)

## 2015-12-20 LAB — CULTURE, BLOOD (ROUTINE X 2): Culture: NO GROWTH

## 2015-12-20 MED ORDER — SENNA 8.6 MG PO TABS: 1.0000 | ORAL_TABLET | Freq: Every day | ORAL | Status: DC

## 2015-12-20 MED ORDER — FUROSEMIDE 40 MG PO TABS: 20.0000 mg | ORAL_TABLET | ORAL | Status: DC

## 2015-12-20 MED ORDER — POLYETHYLENE GLYCOL 3350 17 G PO PACK: 17.0000 g | PACK | Freq: Every day | ORAL | Status: DC

## 2015-12-20 NOTE — Discharge Summary (Addendum)
Physician Discharge Summary  Theresa Cortez YPP:[id number] DOB: 21-Jun-1930 DOA: 12/15/2015  PCP: Gwenyth Bender, MD  Admit date: 12/15/2015 Discharge date: 12/20/2015  Time spent: *  Recommendations for Outpatient Follow-up:  1. Amiodarone, Lipitor and Methotrexate stopped due to elevated LFTS 2. CBG TID AC/HS- not requiring insulin here 3. Repeat Cmet in 1 wk to follow LFTs and electrolytes 4. Daily weights- adjust lasix based on this and Bmet 5. Avoid sodium phosphate enemas 6. Carb modified, low sodium low fat diet renal diet  Discharge Condition: stable    Discharge Diagnoses:  Principal Problem:   Nausea with vomiting  Abdominal pain Active Problems:   Lactic acidosis due to dehydration and hypotension on admission   Acute renal failure superimposed on stage 3 chronic kidney disease due to Fluid overload in Chronic combined systolic and diastolic congestive heart failure (HCC)   Influenza B   Hypertension   Tricuspid regurgitation   Pulmonary hypertension (HCC)   Diabetes mellitus, type 2 (HCC)   HLD (hyperlipidemia)   Hypothyroidism   RA (rheumatoid arthritis) (HCC)   Hyperkalemia   Hyponatremia   Chronic atrial fibrillation (HCC)   Adnexal cyst - left   Protein-calorie malnutrition, moderate    Elevated LFTs  History of present illness:  Theresa Cortez is a 82 y.o. female PMH of hypertension, hyperlipidemia, diabetes mellitus, hypothyroidism, combined systolic and diastolic congestive heart failure with EF 25-30%, atrial fibrillation on Eliquis, chronic kidney disease-stage III, rheumatoid arthritis on methotrexate,DJD, mitral regurgitation, tricuspid regurgitation, who presents with productive cough, nausea, vomiting and right sided abdominal pain. Last admission 3/13-3/21 for CHF  Cough is chronic. Main concern were her GI issues and electrolyte abnormalities Noted to have electrolyte abnormalities with Ns of 125, K of 6.1 and AKI with Cr 2.19 and lactic acidosis  3.9. LFTS were mildly elevated and due to this and her new vomiting, CT abd/pelvis was done- ? Gallbladder thickening On 4/5 she had an episode of jerking followed by unresponsiveness for 1 min prior to becoming alert again.   Hospital Course:  Principal Problem:  Vomiting abdominal pain - with mildly elevated LFTs which are actually better than last admission - Abd ultrasound also showed thickening and pericholecystic fluid. HIDA was done and found to be negative. - HIDA negative- started diet & stopped antibiotics 4/6 - no further GI issues - may have been related to Influenza?  Influenza B - found to be + for this on 4/8- resp symptoms are mild- cough is chronic- will not give Tamiflu  Hyponatremia/ hyperkalemia/ AKI on CKD3/ lactic acidosis - due to diuretics and vomiting? Initially dehydrated.  - subsequently became fluid overloaded - BUN/Cr, sodium improved with Lasix- & with NaCl tabs-  - weight now same as admit weight- 83kg - being discharged home on a lower dose of Lasix due to hyponatremia and AKI on admission - hold Januvia (AKI is side effect) - out pt f/u with Dr Lacy Duverney  Hypotension/ Chronic combined systolic and diastolic heart failure - hypotension improved with fluid boluses-  - EF 25-30% with severe MR- BPs were low during last admission and therefore she was not given ACE/ARB, Hydralazine/ Imdur - see above in regards to fluid status & Lasix  Uresponsive episode -on 4/5- possible seizure- not surprising with her electrolyte abnormalities- no further work up   Cough -not new issue- had this problem when last discharged- she had 3 rounds of antibiotics for this as outpt-no post nasal drip- supportive care   Diabetes mellitus,  type 2 - hold Januvia, low dose ISS while following PO intake - sugars have been normal no need to resume Januvia   Hypothyroidism - TSH 2.6, F T 4 1.49 - F t4 slightly elevated- will not adjust Synthroid at this time    RA (rheumatoid arthritis) - Plaquenil - takes Methotrexate on Fridays - hold due to elevated LFTs   Chronic atrial fibrillation - Eliquis + Amiodarone- cardiology stopped Amiodarone 4/7 due to elevated LFTs  Elevated LFTs - holding Amiodarone, Methotrexate, Lipitor   Adnexal cyst - left - chronic   Protein-calorie malnutrition, moderate    Procedures:  *HIDA in addition to below radiology studies  Consultations:  gen surgery  Cardiology  Nephrology   Discharge Exam: Filed Weights   12/18/15 0650 12/19/15 0544 12/20/15 0441  Weight: 84.3 kg (185 lb 13.6 oz) 86.3 kg (190 lb 4.1 oz) 83.1 kg (183 lb 3.2 oz)   Filed Vitals:   12/19/15 2134 12/20/15 0441  BP: 102/57 101/56  Pulse: 84 78  Temp: 97.8 F (36.6 C) 97.8 F (36.6 C)  Resp: 20 20    General: AAO x 3, no distress Cardiovascular: RRR, no murmurs  Respiratory: clear to auscultation bilaterally GI: soft, non-tender, non-distended, bowel sound positive  Discharge Instructions You were cared for by a hospitalist during your hospital stay. If you have any questions about your discharge medications or the care you received while you were in the hospital after you are discharged, you can call the unit and asked to speak with the hospitalist on call if the hospitalist that took care of you is not available. Once you are discharged, your primary care physician will handle any further medical issues. Please note that NO REFILLS for any discharge medications will be authorized once you are discharged, as it is imperative that you return to your primary care physician (or establish a relationship with a primary care physician if you do not have one) for your aftercare needs so that they can reassess your need for medications and monitor your lab values.      Discharge Instructions    Discharge instructions    Complete by:  As directed   Carb modified, low sodium low fat diet CBC TID AC/HS Repeat Cmet in 1 wk      Increase activity slowly    Complete by:  As directed             Medication List    STOP taking these medications        amiodarone 200 MG tablet  Commonly known as:  PACERONE     atorvastatin 10 MG tablet  Commonly known as:  LIPITOR     guaifenesin 100 MG/5ML syrup  Commonly known as:  ROBITUSSIN     insulin aspart 100 UNIT/ML injection  Commonly known as:  NOVOLOG     methotrexate 2.5 MG tablet  Commonly known as:  RHEUMATREX     sitaGLIPtin 50 MG tablet  Commonly known as:  JANUVIA     sodium phosphate enema  Commonly known as:  FLEET      TAKE these medications        acetaminophen 325 MG tablet  Commonly known as:  TYLENOL  Take 650 mg by mouth every 4 (four) hours as needed for moderate pain.     alum & mag hydroxide-simeth 200-200-20 MG/5ML suspension  Commonly known as:  MAALOX/MYLANTA  Take 30 mLs by mouth every 6 (six) hours as needed for indigestion.  apixaban 2.5 MG Tabs tablet  Commonly known as:  ELIQUIS  Take 1 tablet (2.5 mg total) by mouth 2 (two) times daily.     benzonatate 200 MG capsule  Commonly known as:  TESSALON  Take 200 mg by mouth every 8 (eight) hours as needed for cough.     diclofenac sodium 1 % Gel  Commonly known as:  VOLTAREN  Apply 4 g topically 4 (four) times daily.     docusate sodium 100 MG capsule  Commonly known as:  COLACE  Take 2 capsules (200 mg total) by mouth 2 (two) times daily.     folic acid 1 MG tablet  Commonly known as:  FOLVITE  Take 1 mg by mouth daily.     furosemide 40 MG tablet  Commonly known as:  LASIX  Take 0.5 tablets (20 mg total) by mouth every other day.     guaiFENesin-codeine 100-10 MG/5ML syrup  Commonly known as:  ROBITUSSIN AC  Take 10 mLs by mouth at bedtime as needed for cough.     hydroxychloroquine 200 MG tablet  Commonly known as:  PLAQUENIL  Take 200 mg by mouth 2 (two) times daily.     latanoprost 0.005 % ophthalmic solution  Commonly known as:  XALATAN  Place 1  drop into both eyes at bedtime.     levothyroxine 75 MCG tablet  Commonly known as:  SYNTHROID, LEVOTHROID  Take 75 mcg by mouth daily before breakfast.     polyethylene glycol packet  Commonly known as:  MIRALAX / GLYCOLAX  Take 17 g by mouth daily.     senna 8.6 MG Tabs tablet  Commonly known as:  SENOKOT  Take 1 tablet (8.6 mg total) by mouth daily.      ASK your doctor about these medications        levofloxacin 500 MG tablet  Commonly known as:  LEVAQUIN  Take 1 tablet (500 mg total) by mouth daily. For 7 days.       Allergies  Allergen Reactions  . Penicillins Swelling      The results of significant diagnostics from this hospitalization (including imaging, microbiology, ancillary and laboratory) are listed below for reference.    Significant Diagnostic Studies: Ct Abdomen Pelvis Wo Contrast  12/16/2015  CLINICAL DATA:  Right mid abdominal pain for 3 days. Nausea and vomiting. EXAM: CT ABDOMEN AND PELVIS WITHOUT CONTRAST TECHNIQUE: Multidetector CT imaging of the abdomen and pelvis was performed following the standard protocol without IV contrast. COMPARISON:  CT 4 days prior 12/12/2015 FINDINGS: Lower chest: Stable cardiomegaly. Bibasilar bronchiectasis, bibasilar opacities, and small pleural effusions, unchanged from prior exam. Liver: No focal lesion allowing for lack contrast. Hepatobiliary: High-density material within the gallbladder, likely combination of sludge and stones. Question of gallbladder wall thickening and pericholecystic soft tissue stranding. No evidence of biliary dilatation. Pancreas: No ductal dilatation or inflammation. Suboptimal pancreatic assessment given lack contrast. Spleen: Normal. Adrenal glands: Bilateral thickening, no nodule. Kidneys: Bilateral perinephric soft tissue stranding, new from prior. No hydronephrosis. Stomach/Bowel: No abnormal gastric distention, small hiatal hernia. Limited bowel assessment given lack contrast. There are no  dilated or thickened small bowel loops. Enteric chain sutures noted in the left abdomen. No evidence of bowel obstruction. Moderate stool burden. The appendix is not visualized. Vascular/Lymphatic: No retroperitoneal adenopathy. Abdominal aorta is normal in caliber. Moderate atherosclerosis of the abdominal aorta without aneurysm. Reproductive: Left ovarian/adnexal cyst measures 2.6 cm, unchanged from recent prior. Uterus remains in situ. Right ovary  is not well seen. Bladder: Physiologically distended. Other: Development of small volume free fluid in both pericolic gutters and in the pelvis. Mild mesenteric edema. No free air. Musculoskeletal: There are no acute or suspicious osseous abnormalities. Degenerative change in the lumbar spine. IMPRESSION: 1. High-density material in the gallbladder, likely combination of small stones and sludge. Questionable gallbladder wall thickening and pericholecystic soft tissue stranding. Given right-sided abdominal pain, acute cholecystitis is considered. Further evaluation with right upper quadrant ultrasound recommended. 2. Development of small volume free fluid in the abdomen and pelvis. Mesenteric and perinephric edema. Findings may be related to hydration status. Correlation with urinalysis recommended to exclude urinary tract infection. 3. Left adnexal cyst measures 2.6 cm, this is unchanged from recent prior, however abnormal in a postmenopausal patient of this age. Nonemergent sonographic follow-up is recommended. 4. Additional chronic findings are stable. Electronically Signed   By: Rubye Oaks M.D.   On: 12/16/2015 04:15   Ct Abdomen Pelvis Wo Contrast  12/12/2015  CLINICAL DATA:  Acute onset of right flank pain and increased generalized weakness. Initial encounter. EXAM: CT ABDOMEN AND PELVIS WITHOUT CONTRAST TECHNIQUE: Multidetector CT imaging of the abdomen and pelvis was performed following the standard protocol without IV contrast. COMPARISON:  CT of the  abdomen and pelvis performed 12/28/2004, and right upper quadrant ultrasound performed 11/23/2015 FINDINGS: Bibasilar bronchiectasis is noted. Patchy bibasilar opacities likely reflect atelectasis or scarring. The heart is mildly enlarged. The liver and spleen are unremarkable in appearance. The gallbladder is within normal limits. The pancreas. Is unremarkable. There is prominence of the adrenal glands, concerning for mild adrenal hyperplasia. The kidneys are unremarkable in appearance. There is no evidence of hydronephrosis. No renal or ureteral stones are seen. No perinephric stranding is appreciated. No free fluid is identified. The small bowel is unremarkable in appearance. The stomach is within normal limits. No acute vascular abnormalities are seen. Mild scattered calcification is seen along the abdominal aorta and its branches. The patient is status post appendectomy. The colon is unremarkable in appearance. The bladder is mildly distended and grossly remarkable. A small fibroid is suggested at the lower uterine segment. The uterus is otherwise unremarkable. The ovaries are grossly symmetric, aside from a likely 2.5 cm left adnexal cyst. No inguinal lymphadenopathy is seen. No acute osseous abnormalities are identified. Facet disease is noted at the lower lumbar spine. IMPRESSION: 1. No acute abnormality seen to explain the patient's symptoms. 2. Bibasilar bronchiectasis noted. Patchy bibasilar airspace opacities likely reflect atelectasis or scarring. 3. Prominence of the adrenal glands, concerning for mild adrenal hyperplasia. 4. Mild scattered calcification along the abdominal aorta and its branches. 5. Small uterine fibroid suggested. 6. Mild cardiomegaly. Electronically Signed   By: Roanna Raider M.D.   On: 12/12/2015 23:06   Dg Chest 2 View  12/15/2015  CLINICAL DATA:  Weakness and shortness of Breath EXAM: CHEST  2 VIEW COMPARISON:  12/12/2015 FINDINGS: Cardiac shadow remains enlarged. The lungs  are again well aerated. Some increasing density is noted in the right upper lobe which may represent some early infiltrate. Chronic interstitial changes are noted. No sizable effusion is seen. No acute bony abnormality is noted. IMPRESSION: Increased density in the right upper lobe which may represent acute on chronic infiltrate. Electronically Signed   By: Alcide Clever M.D.   On: 12/15/2015 17:08   Dg Chest 2 View  12/12/2015  CLINICAL DATA:  Back and right sided flank pain for several days EXAM: CHEST  2 VIEW COMPARISON:  None. FINDINGS: Cardiac shadow is mildly enlarged. Chronic bibasilar opacities are noted. No focal infiltrate or sizable effusion is noted. No bony abnormality is noted. IMPRESSION: Chronic changes without acute abnormality. Electronically Signed   By: Alcide Clever M.D.   On: 12/12/2015 21:26   Dg Chest 2 View  12/06/2015  CLINICAL DATA:  Cough for 2 weeks.  Congestion. EXAM: CHEST  2 VIEW COMPARISON:  11/25/2015 FINDINGS: There is hyperinflation of the lungs compatible with COPD. Patchy airspace disease noted over both mid and lower lungs, new since prior study concerning for multifocal pneumonia. No pleural effusions. Heart is mildly enlarged. No acute bony abnormality. IMPRESSION: COPD, cardiomegaly. Patchy bilateral mid and lower lung airspace opacities concerning for pneumonia. Electronically Signed   By: Charlett Nose M.D.   On: 12/06/2015 15:03   Dg Chest 2 View  11/22/2015  CLINICAL DATA:  Cough for 2 day EXAM: CHEST  2 VIEW COMPARISON:  04/26/2015 FINDINGS: The heart is moderately enlarged. Lungs are hyperaerated. Pulmonary vascular is within normal limits. Tiny pleural effusions and bibasilar atelectasis. No pneumothorax. IMPRESSION: Cardiomegaly without decompensation. Tiny pleural effusions and bibasilar atelectasis. Electronically Signed   By: Jolaine Click M.D.   On: 11/22/2015 18:02   Dg Thoracic Spine W/swimmers  11/28/2015  CLINICAL DATA:  Cough and upper back pain.  EXAM: THORACIC SPINE - 3 VIEWS COMPARISON:  Portable chest dated 11/25/2015. Two-view chest dated 11/22/2015. FINDINGS: Multilevel degenerative changes in the thoracic and cervical spine. No fractures or subluxations. IMPRESSION: No acute abnormality.  Multilevel degenerative changes. Electronically Signed   By: Beckie Salts M.D.   On: 11/28/2015 11:06   Nm Hepatobiliary Including Gb  12/16/2015  CLINICAL DATA:  Right upper quadrant abdominal pain for 1 month. EXAM: NUCLEAR MEDICINE HEPATOBILIARY IMAGING TECHNIQUE: Sequential images of the abdomen were obtained out to 60 minutes following intravenous administration of radiopharmaceutical. RADIOPHARMACEUTICALS:  5.2 mCi Tc-68m  Choletec IV COMPARISON:  CT scan of December 12, 2015. FINDINGS: Prompt uptake and biliary excretion of activity by the liver is seen. Gallbladder activity is visualized, consistent with patency of cystic duct. Biliary activity passes into small bowel, consistent with patent common bile duct. IMPRESSION: Normal uptake is seen within the gallbladder.  No abnormality seen. Electronically Signed   By: Lupita Raider, M.D.   On: 12/16/2015 13:09   US Renal  11/23/2015  CLINICAL DATA:  Acute renal failure EXAM: RENAL / URINARY TRACT ULTRASOUND COMPLETE COMPARISON:  01/27/2011 FINDINGS: Right Kidney: Length: 10.9 cm. Normal echogenicity with no mass or hydronephrosis. 9 x 5 x 5 mm exophytic lower pole cyst. Left Kidney: Length: 9.7 cm. Echogenicity within normal limits. No mass or hydronephrosis visualized. Bladder: Appears normal for degree of bladder distention. IMPRESSION: No significant abnormalities Electronically Signed   By: Esperanza Heir M.D.   On: 11/23/2015 12:21   Nm Pulmonary Perf And Vent  11/23/2015  CLINICAL DATA:  82 year old female with progressive shortness of breath cough and mid scapular pain. Abnormal D-dimer. Initial encounter. EXAM: NUCLEAR MEDICINE VENTILATION - PERFUSION LUNG SCAN TECHNIQUE: Ventilation images were  obtained in multiple projections using inhaled aerosol Tc-76m DTPA. Perfusion images were obtained in multiple projections after intravenous injection of Tc-6m MAA. RADIOPHARMACEUTICALS:  32.0 Technetium-61m DTPA aerosol inhalation and 4.3 Technetium-48m MAA IV COMPARISON:  PA and lateral chest radiographs 11/22/2015. FINDINGS: Ventilation: No ventilation defect identified. Photopenia related to cardiomegaly noted. Perfusion: Homogeneous perfusion radiotracer activity. No wedge shaped peripheral perfusion defects to suggest acute pulmonary embolism. IMPRESSION: Normal VQ scan.  Electronically Signed   By: Odessa Fleming M.D.   On: 11/23/2015 16:07   Dg Chest Port 1 View  12/17/2015  CLINICAL DATA:  Pulmonary infiltrate. EXAM: PORTABLE CHEST 1 VIEW COMPARISON:  12/15/2015. FINDINGS: Mediastinum and hilar structures normal. Cardiomegaly with normal pulmonary vascularity. Interim clearing of right upper lobe infiltrate. Mild basilar atelectasis. Small pleural effusions cannot be excluded . IMPRESSION: 1. Interim clearing of right upper lobe infiltrate. Persistent mild basilar atelectasis and small bilateral pleural effusions. 2.  Stable cardiomegaly. Electronically Signed   By: Maisie Fus  Register   On: 12/17/2015 07:16   Dg Chest Port 1v Same Day  11/25/2015  CLINICAL DATA:  Chest wheezing today. EXAM: PORTABLE CHEST 1 VIEW COMPARISON:  November 22, 2015 FINDINGS: The heart size and mediastinal contours are stable. The heart size is enlarged. The lungs are hyperinflated. There is no focal infiltrate, pulmonary edema, or pleural effusion. The visualized skeletal structures are stable. IMPRESSION: No active cardiopulmonary disease.  Cardiomegaly.  Emphysema. Electronically Signed   By: Sherian Rein M.D.   On: 11/25/2015 16:58   US Abdomen Limited Ruq  12/16/2015  CLINICAL DATA:  Abdominal pain. EXAM: US ABDOMEN LIMITED - RIGHT UPPER QUADRANT COMPARISON:  CT 12/16/2015 . FINDINGS: Gallbladder: No gallstones. Gallbladder wall  is thickened up to 3.8 mm. Pericholecystic fluid noted. Negative Murphy sign. Common bile duct: Diameter: 1.7 mm Liver: No focal lesion identified. Within normal limits in parenchymal echogenicity. Small right pleural effusion. IMPRESSION: 1. No gallstones noted. 2. Gallbladder wall thickening up to 3.8 mm noted. This can be seen with cholecystitis and or hypoproteinemia. Pericholecystic fluid noted. Negative Murphy sign. 3.  Small right pleural effusion. Electronically Signed   By: Maisie Fus  Register   On: 12/16/2015 08:20   US Abdomen Limited Ruq  11/23/2015  CLINICAL DATA:  Elevated liver function tests EXAM: US ABDOMEN LIMITED - RIGHT UPPER QUADRANT COMPARISON:  12/28/2004 FINDINGS: Gallbladder: No gallstones or wall thickening visualized. No sonographic Murphy sign noted by sonographer. Common bile duct: Diameter: 3 mm Liver: No focal lesion identified. Within normal limits in parenchymal echogenicity. IMPRESSION: Normal right upper quadrant ultrasound Electronically Signed   By: Esperanza Heir M.D.   On: 11/23/2015 12:18    Microbiology: Recent Results (from the past 240 hour(s))  Culture, blood (routine x 2)     Status: None (Preliminary result)   Collection Time: 12/15/15  4:48 PM  Result Value Ref Range Status   Specimen Description BLOOD RIGHT ARM  Final   Special Requests BOTTLES DRAWN AEROBIC AND ANAEROBIC 5CC  Final   Culture   Final    NO GROWTH 4 DAYS Performed at [facility]    Report Status PENDING  Incomplete  MRSA PCR Screening     Status: None   Collection Time: 12/15/15 10:12 PM  Result Value Ref Range Status   MRSA by PCR NEGATIVE NEGATIVE Final    Comment:        The GeneXpert MRSA Assay (FDA approved for NASAL specimens only), is one component of a comprehensive MRSA colonization surveillance program. It is not intended to diagnose MRSA infection nor to guide or monitor treatment for MRSA infections.   Respiratory virus panel     Status: Abnormal    Collection Time: 12/16/15  1:04 AM  Result Value Ref Range Status   Respiratory Syncytial Virus A Negative Negative Final   Respiratory Syncytial Virus B Negative Negative Final   Influenza A Negative Negative Final   Influenza B Positive (A)  Negative Final   Parainfluenza 1 Negative Negative Final   Parainfluenza 2 Negative Negative Final   Parainfluenza 3 Negative Negative Final   Metapneumovirus Negative Negative Final   Rhinovirus Negative Negative Final   Adenovirus Negative Negative Final    Comment: (NOTE) Performed At: [facility] [address] [place], [place] [id number] Mila Homer MD [id number]   Culture, sputum-assessment     Status: None   Collection Time: 12/16/15  4:08 AM  Result Value Ref Range Status   Specimen Description SPUTUM  Final   Special Requests NONE  Final   Sputum evaluation   Final    THIS SPECIMEN IS ACCEPTABLE. RESPIRATORY CULTURE REPORT TO FOLLOW.   Report Status 12/16/2015 FINAL  Final  Culture, respiratory (NON-Expectorated)     Status: None   Collection Time: 12/16/15  4:08 AM  Result Value Ref Range Status   Specimen Description SPUTUM  Final   Special Requests NONE  Final   Gram Stain   Final    FEW WBC PRESENT, PREDOMINANTLY PMN MODERATE SQUAMOUS EPITHELIAL CELLS PRESENT ABUNDANT GRAM POSITIVE COCCI IN PAIRS IN CHAINS FEW GRAM POSITIVE RODS Performed at [facility]    Culture   Final    NORMAL OROPHARYNGEAL FLORA Performed at [facility]    Report Status 12/18/2015 FINAL  Final  Urine culture     Status: None   Collection Time: 12/16/15  3:55 PM  Result Value Ref Range Status   Specimen Description URINE, CATHETERIZED  Final   Special Requests NONE  Final   Culture   Final    NO GROWTH 1 DAY Performed at [facility]    Report Status 12/17/2015 FINAL  Final     Labs: Basic Metabolic Panel:  Recent Labs Lab 12/17/15 0318 12/17/15 1503 12/18/15 0533 12/18/15 1620  12/19/15 0536 12/20/15 0509  NA 126* 128* 128* 130* 135 138  K 5.1 5.4* 4.8 5.4* 4.7 4.3  CL 97* 100* 100* 105 107 108  CO2 19* 16* 17* 17* 21* 22  GLUCOSE 108* 130* 85 172* 77 97  BUN 35* 39* 38* 36* 32* 29*  CREATININE 2.17* 2.20* 2.10* 2.00* 1.83* 1.51*  CALCIUM 8.0* 8.3* 8.0* 7.9* 8.4* 8.7*  MG 1.9  --   --   --   --   --    Liver Function Tests:  Recent Labs Lab 12/15/15 1632 12/16/15 0921 12/19/15 0536 12/20/15 0509  AST 99* 110* 266* 149*  ALT 75* 91* 263* 205*  ALKPHOS 299* 232* 259* 216*  BILITOT 1.2 1.6* 0.5 0.9  PROT 7.3 6.1* 5.5* 5.4*  ALBUMIN 3.0* 2.6* 2.2* 2.2*    Recent Labs Lab 12/15/15 1644  LIPASE 27   No results for input(s): AMMONIA in the last 168 hours. CBC:  Recent Labs Lab 12/15/15 1632 12/16/15 0921 12/17/15 0318  WBC 7.3 7.7 7.7  NEUTROABS 5.6  --   --   HGB 11.2* 10.1* 9.7*  HCT 33.1* 29.9* 28.4*  MCV 93.0 94.3 93.7  PLT 306 288 269   Cardiac Enzymes:  Recent Labs Lab 12/15/15 1632  TROPONINI <0.03   BNP: BNP (last 3 results)  Recent Labs  11/22/15 2052 12/16/15 0113  BNP 1732.2* 1426.5*    ProBNP (last 3 results) No results for input(s): PROBNP in the last 8760 hours.  CBG:  Recent Labs Lab 12/19/15 0748 12/19/15 1143 12/19/15 1653 12/19/15 2137 12/20/15 0738  GLUCAP 74 81 159* 99 75  SignedCalvert Cantor, MD [facility] 12/20/2015, 8:29 AM

## 2015-12-20 NOTE — Progress Notes (Signed)
NUTRITION NOTE  Consult received for low potassium diet education. Two family members at bedside at time of RD visit and confirm that pt to d/c to a facility. D/c summary and order currently in place. Pt currently on Renal diet with 1200cc fluid restriction and mainly eating 75-100%. Provided family members with handouts from the Academy of Nutrition and Dietetics: "Low Potassium Foods List" and "High Potassium Foods List." Reviewed these with family members and explained rationale for need to monitor dietary intake of potassium. Expressed understanding of often limited food options during meal times at facilities and encouraged pt and family to do the best they are able to do.   No further nutrition intervention warranted at this time. Pt and family deny questions or concerns. If further nutrition-related needs arise prior to d/c please re-consult.    Trenton Gammon, RD, LDN Inpatient Clinical Dietitian Pager # [phone] After hours/weekend pager # [phone]

## 2015-12-20 NOTE — Care Management Important Message (Signed)
Important Message  Patient Details  Name: Theresa Cortez MRN: [id number] Date of Birth: [birth year]   Medicare Important Message Given:  Yes    Haskell Flirt 12/20/2015, 10:57 AMImportant Message  Patient Details  Name: Theresa Cortez MRN: [id number] Date of Birth: [birth year]   Medicare Important Message Given:  Yes    Haskell Flirt 12/20/2015, 10:57 AM

## 2015-12-20 NOTE — Progress Notes (Signed)
Pt has voided post Foley removal this AM. 200 cc clear yellow urine. Pt's Tele and NSL x2 dcd. Pt to be discharged to [facility] this afternoon

## 2015-12-20 NOTE — Progress Notes (Signed)
Patient is set to discharge back to [facility] today. Patient & family at bedside made aware. Discharge packet given to RN, Victorino Dike. [facility] called for transport.     Lincoln Maxin, LCSW [facility] Clinical Social Worker cell #: [phone]

## 2015-12-21 ENCOUNTER — Ambulatory Visit (INDEPENDENT_AMBULATORY_CARE_PROVIDER_SITE_OTHER): Payer: Medicare Other | Admitting: Cardiology

## 2015-12-21 ENCOUNTER — Ambulatory Visit: Payer: Medicare Other | Admitting: Cardiology

## 2015-12-21 ENCOUNTER — Encounter: Payer: Self-pay | Admitting: Cardiology

## 2015-12-21 VITALS — BP 108/70 | HR 90 | Ht 66.0 in | Wt 179.0 lb

## 2015-12-21 DIAGNOSIS — I2583 Coronary atherosclerosis due to lipid rich plaque: Secondary | ICD-10-CM

## 2015-12-21 DIAGNOSIS — N184 Chronic kidney disease, stage 4 (severe): Secondary | ICD-10-CM

## 2015-12-21 DIAGNOSIS — I480 Paroxysmal atrial fibrillation: Secondary | ICD-10-CM

## 2015-12-21 DIAGNOSIS — I2510 Atherosclerotic heart disease of native coronary artery without angina pectoris: Secondary | ICD-10-CM | POA: Diagnosis not present

## 2015-12-21 DIAGNOSIS — J189 Pneumonia, unspecified organism: Secondary | ICD-10-CM

## 2015-12-21 DIAGNOSIS — I5021 Acute systolic (congestive) heart failure: Secondary | ICD-10-CM

## 2015-12-21 DIAGNOSIS — I428 Other cardiomyopathies: Secondary | ICD-10-CM | POA: Insufficient documentation

## 2015-12-21 DIAGNOSIS — I429 Cardiomyopathy, unspecified: Secondary | ICD-10-CM

## 2015-12-21 DIAGNOSIS — N189 Chronic kidney disease, unspecified: Secondary | ICD-10-CM | POA: Diagnosis not present

## 2015-12-21 DIAGNOSIS — Z7901 Long term (current) use of anticoagulants: Secondary | ICD-10-CM | POA: Insufficient documentation

## 2015-12-21 MED ORDER — FUROSEMIDE 40 MG PO TABS: 20.0000 mg | ORAL_TABLET | Freq: Every day | ORAL | Status: DC

## 2015-12-21 NOTE — Assessment & Plan Note (Signed)
MTX stopped during her recent admission secondary to LFTs

## 2015-12-21 NOTE — Assessment & Plan Note (Signed)
Admitted 12/15/2015-12/20/2015

## 2015-12-21 NOTE — Assessment & Plan Note (Signed)
On renal dose Eliquis

## 2015-12-21 NOTE — Assessment & Plan Note (Signed)
Mild at cath 11/25/2015

## 2015-12-21 NOTE — Assessment & Plan Note (Signed)
Stage 3-4. Seen by renal service (Dr Arlean Hopping) during recent admission. Borderline HD candidate

## 2015-12-21 NOTE — Assessment & Plan Note (Signed)
She was on Amiodarone-stopped April 2017 secondary to elevated LFTs

## 2015-12-21 NOTE — Assessment & Plan Note (Addendum)
Hospitalized in early March this year- DC'd to SNF after this adm, then re admitted at week later with HCAP.

## 2015-12-21 NOTE — Assessment & Plan Note (Signed)
EF 25% - 30% by echo 11/24/2015

## 2015-12-21 NOTE — Progress Notes (Signed)
12/21/2015 Theresa Cortez   1929-12-03  [id number]  Primary Physician Gwenyth Bender, MD Primary Cardiologist: Dr Royann Shivers  HPI:  82 y.o.AA female PMH of hypertension, hyperlipidemia, diabetes mellitus, hypothyroidism, combined systolic congestive heart failure with EF 25-30% with moderate to severe mitral regurgitation, PAF on Eliquis and Amiodarone, chronic kidney disease-stage III-IV, rheumatoid arthritis on methotrexate, and DJD s/p prior Rt TKR in [place] . She was just hospitalized 11/22/15 to 11/30/15 for CHF and a new diagnosis of non ischemic DCM.Echo 3/15 with EF 25-30% and moderate to severe MR.Cath 11/25/14 showed no CAD.  She was discharge to SNF. She saw Dr Royann Shivers in follow up 12/06/15. The plan was to decrease her diuretics and Amiodarone further as an OP.            She presented to [facility] 12/15/15 with respiratory distress and was admitted with HCAP. During that admission there was concern she had acute cholecystitis. She was followed by the surgical group. Amiodarone , Januvia, and her statin Rx were stopped. Ultimately it was determined she didn't have acute cholecystitis. She also had acute on chronic renal insufficiency and was seen by the renal service her diuretics were cut back. She was just discharged back to SNF yesterday and seen today in the office (this visit may have been a follow up to her 3/27 OV). The pt does have some LE edema. She is in a wheel chair. Sh denies any increased dyspnea since yesterday.    Current Outpatient Prescriptions  Medication Sig Dispense Refill  . acetaminophen (TYLENOL) 325 MG tablet Take 650 mg by mouth every 4 (four) hours as needed for moderate pain.    Marland Kitchen apixaban (ELIQUIS) 2.5 MG TABS tablet Take 1 tablet (2.5 mg total) by mouth 2 (two) times daily. 60 tablet   . benzonatate (TESSALON) 200 MG capsule Take 200 mg by mouth every 8 (eight) hours as needed for cough.    . diclofenac sodium (VOLTAREN) 1 % GEL Apply 4 g topically 4 (four)  times daily.    Marland Kitchen docusate sodium (COLACE) 100 MG capsule Take 2 capsules (200 mg total) by mouth 2 (two) times daily. 10 capsule 0  . folic acid (FOLVITE) 1 MG tablet Take 1 mg by mouth daily.    . furosemide (LASIX) 40 MG tablet Take 0.5 tablets (20 mg total) by mouth daily. 30 tablet 6  . hydroxychloroquine (PLAQUENIL) 200 MG tablet Take 200 mg by mouth 2 (two) times daily.    Marland Kitchen latanoprost (XALATAN) 0.005 % ophthalmic solution Place 1 drop into both eyes at bedtime.    Marland Kitchen levothyroxine (SYNTHROID, LEVOTHROID) 75 MCG tablet Take 75 mcg by mouth daily before breakfast.    . polyethylene glycol (MIRALAX / GLYCOLAX) packet Take 17 g by mouth daily. 14 each 0   No current facility-administered medications for this visit.    Allergies  Allergen Reactions  . Penicillins Swelling    Social History   Social History  . Marital Status: Widowed    Spouse Name: N/A  . Number of Children: N/A  . Years of Education: N/A   Occupational History  . Not on file.   Social History Main Topics  . Smoking status: Never Smoker   . Smokeless tobacco: Former Neurosurgeon    Types: Snuff  . Alcohol Use: No  . Drug Use: No  . Sexual Activity: No   Other Topics Concern  . Not on file   Social History Narrative  Review of Systems: General: negative for chills, fever, night sweats or weight changes.  Cardiovascular: negative for chest pain, dyspnea on exertion, edema, orthopnea, palpitations, paroxysmal nocturnal dyspnea or shortness of breath Dermatological: negative for rash Respiratory: negative for cough or wheezing Urologic: negative for hematuria Abdominal: negative for nausea, vomiting, diarrhea, bright red blood per rectum, melena, or hematemesis Neurologic: negative for visual changes, syncope, or dizziness All other systems reviewed and are otherwise negative except as noted above.    Blood pressure 108/70, pulse 90, height 5\' 6"  (1.676 m), weight 179 lb (81.194 kg).  General  appearance: alert, cooperative and no distress Lungs: velcro crackles Lt base, Rt clear Heart: regular rate and rhythm Extremities: 1+ edema bilateraly Skin: cool, dry Neurologic: Grossly normal   ASSESSMENT AND PLAN:   Acute congestive heart failure (HCC) Hospitalized in early March this year- DC'd to SNF after this adm, then re admitted at week later with HCAP.  Nonischemic cardiomyopathy (HCC) EF 25% - 30% by echo 11/24/2015  Coronary artery disease Mild at cath 11/25/2015  Chronic renal insufficiency, stage IV (severe) Stage 3-4. Seen by renal service (Dr 11/27/2015) during recent admission. Borderline HD candidate  PAF (paroxysmal atrial fibrillation) (HCC) She was on Amiodarone-stopped April 2017 secondary to elevated LFTs  HCAP (healthcare-associated pneumonia) Admitted 12/15/2015-12/20/2015  RA (rheumatoid arthritis) (HCC) MTX stopped during her recent admission secondary to LFTs  Chronic anticoagulation On renal dose Eliquis     PLAN  I ordered a CMET for next week. The nursing home sent a list of her medications but I think it was from her previous SNF adm. I updated her medications to reflect what she was on at discharge 12/20/15. I did increase her Lasix to 20 mg daily and encouraged her daughter to contact Dr 02/19/16 office for follow up.   Arlean Hopping K PA-C 12/21/2015 1:26 PM

## 2015-12-21 NOTE — Patient Instructions (Addendum)
Your physician recommends that you schedule a follow-up appointment in 6 weeks with Dr Royann Shivers.  Your physician recommends that you return for lab work in: 1 week  Your physician has recommended you make the following change in your medication: increase the furosemide to 20 mg daily

## 2015-12-28 ENCOUNTER — Telehealth: Payer: Self-pay | Admitting: Cardiovascular Disease

## 2015-12-28 NOTE — Telephone Encounter (Signed)
Pt c/o swelling: STAT is pt has developed SOB within 24 hours  1. How long have you been experiencing swelling? She did not mention  2. Where is the swelling located? Legs and feet  3.  Are you currently taking a "fluid pill"?Yes, Furosemide   4.  Are you currently SOB? Did not say  5.  Have you traveled recently?N/A

## 2015-12-28 NOTE — Telephone Encounter (Signed)
Returned call to daughter of patient. No SOB, visible swelling in legs and feet.  Spoke to nurse at facility. Will have nurse practicioner review and increase furosemide to 40mg  daily for 2-3 days then resume original dose, recheck BMP if needed and forward results. Daughter aware.

## 2016-01-13 ENCOUNTER — Inpatient Hospital Stay (HOSPITAL_COMMUNITY)
Admission: EM | Admit: 2016-01-13 | Discharge: 2016-01-18 | DRG: 291 | Disposition: A | Discharge status: Skilled Nursing Facility | Payer: Medicare Other | Attending: Internal Medicine | Admitting: Internal Medicine

## 2016-01-13 ENCOUNTER — Encounter (HOSPITAL_COMMUNITY): Payer: Self-pay | Admitting: Emergency Medicine

## 2016-01-13 ENCOUNTER — Emergency Department (HOSPITAL_COMMUNITY): Payer: Medicare Other

## 2016-01-13 DIAGNOSIS — N189 Chronic kidney disease, unspecified: Secondary | ICD-10-CM

## 2016-01-13 DIAGNOSIS — Z7901 Long term (current) use of anticoagulants: Secondary | ICD-10-CM

## 2016-01-13 DIAGNOSIS — R109 Unspecified abdominal pain: Secondary | ICD-10-CM | POA: Diagnosis not present

## 2016-01-13 DIAGNOSIS — I2510 Atherosclerotic heart disease of native coronary artery without angina pectoris: Secondary | ICD-10-CM | POA: Diagnosis present

## 2016-01-13 DIAGNOSIS — E785 Hyperlipidemia, unspecified: Secondary | ICD-10-CM | POA: Diagnosis present

## 2016-01-13 DIAGNOSIS — I5043 Acute on chronic combined systolic (congestive) and diastolic (congestive) heart failure: Secondary | ICD-10-CM | POA: Diagnosis present

## 2016-01-13 DIAGNOSIS — Z8701 Personal history of pneumonia (recurrent): Secondary | ICD-10-CM | POA: Diagnosis not present

## 2016-01-13 DIAGNOSIS — E1122 Type 2 diabetes mellitus with diabetic chronic kidney disease: Secondary | ICD-10-CM | POA: Diagnosis present

## 2016-01-13 DIAGNOSIS — Z8673 Personal history of transient ischemic attack (TIA), and cerebral infarction without residual deficits: Secondary | ICD-10-CM

## 2016-01-13 DIAGNOSIS — R0600 Dyspnea, unspecified: Secondary | ICD-10-CM | POA: Diagnosis present

## 2016-01-13 DIAGNOSIS — Z96651 Presence of right artificial knee joint: Secondary | ICD-10-CM | POA: Diagnosis present

## 2016-01-13 DIAGNOSIS — M069 Rheumatoid arthritis, unspecified: Secondary | ICD-10-CM | POA: Diagnosis present

## 2016-01-13 DIAGNOSIS — E871 Hypo-osmolality and hyponatremia: Secondary | ICD-10-CM

## 2016-01-13 DIAGNOSIS — K222 Esophageal obstruction: Secondary | ICD-10-CM | POA: Diagnosis present

## 2016-01-13 DIAGNOSIS — N184 Chronic kidney disease, stage 4 (severe): Secondary | ICD-10-CM | POA: Diagnosis present

## 2016-01-13 DIAGNOSIS — E872 Acidosis: Secondary | ICD-10-CM | POA: Diagnosis present

## 2016-01-13 DIAGNOSIS — Z8249 Family history of ischemic heart disease and other diseases of the circulatory system: Secondary | ICD-10-CM | POA: Diagnosis not present

## 2016-01-13 DIAGNOSIS — Z87891 Personal history of nicotine dependence: Secondary | ICD-10-CM

## 2016-01-13 DIAGNOSIS — Z79899 Other long term (current) drug therapy: Secondary | ICD-10-CM | POA: Diagnosis not present

## 2016-01-13 DIAGNOSIS — I447 Left bundle-branch block, unspecified: Secondary | ICD-10-CM | POA: Diagnosis present

## 2016-01-13 DIAGNOSIS — I5023 Acute on chronic systolic (congestive) heart failure: Secondary | ICD-10-CM

## 2016-01-13 DIAGNOSIS — E039 Hypothyroidism, unspecified: Secondary | ICD-10-CM | POA: Diagnosis present

## 2016-01-13 DIAGNOSIS — I959 Hypotension, unspecified: Secondary | ICD-10-CM

## 2016-01-13 DIAGNOSIS — I272 Other secondary pulmonary hypertension: Secondary | ICD-10-CM | POA: Diagnosis present

## 2016-01-13 DIAGNOSIS — R05 Cough: Secondary | ICD-10-CM | POA: Diagnosis not present

## 2016-01-13 DIAGNOSIS — Z880 Allergy status to penicillin: Secondary | ICD-10-CM

## 2016-01-13 DIAGNOSIS — I130 Hypertensive heart and chronic kidney disease with heart failure and stage 1 through stage 4 chronic kidney disease, or unspecified chronic kidney disease: Secondary | ICD-10-CM | POA: Diagnosis present

## 2016-01-13 DIAGNOSIS — I429 Cardiomyopathy, unspecified: Secondary | ICD-10-CM | POA: Diagnosis present

## 2016-01-13 DIAGNOSIS — Z9049 Acquired absence of other specified parts of digestive tract: Secondary | ICD-10-CM | POA: Diagnosis not present

## 2016-01-13 DIAGNOSIS — Z794 Long term (current) use of insulin: Secondary | ICD-10-CM

## 2016-01-13 DIAGNOSIS — I480 Paroxysmal atrial fibrillation: Secondary | ICD-10-CM | POA: Diagnosis present

## 2016-01-13 DIAGNOSIS — I1310 Hypertensive heart and chronic kidney disease without heart failure, with stage 1 through stage 4 chronic kidney disease, or unspecified chronic kidney disease: Secondary | ICD-10-CM

## 2016-01-13 DIAGNOSIS — R0602 Shortness of breath: Secondary | ICD-10-CM | POA: Diagnosis not present

## 2016-01-13 DIAGNOSIS — I340 Nonrheumatic mitral (valve) insufficiency: Secondary | ICD-10-CM | POA: Diagnosis present

## 2016-01-13 DIAGNOSIS — J849 Interstitial pulmonary disease, unspecified: Secondary | ICD-10-CM | POA: Diagnosis present

## 2016-01-13 DIAGNOSIS — I509 Heart failure, unspecified: Secondary | ICD-10-CM

## 2016-01-13 DIAGNOSIS — R059 Cough, unspecified: Secondary | ICD-10-CM

## 2016-01-13 LAB — COMPREHENSIVE METABOLIC PANEL
ALT: 34 U/L (ref 14–54)
AST: 37 U/L (ref 15–41)
Albumin: 2.9 g/dL — ABNORMAL LOW (ref 3.5–5.0)
Alkaline Phosphatase: 131 U/L — ABNORMAL HIGH (ref 38–126)
Anion gap: 13 (ref 5–15)
BILIRUBIN TOTAL: 1 mg/dL (ref 0.3–1.2)
BUN: 19 mg/dL (ref 6–20)
CALCIUM: 9 mg/dL (ref 8.9–10.3)
CO2: 25 mmol/L (ref 22–32)
CREATININE: 1.38 mg/dL — AB (ref 0.44–1.00)
Chloride: 90 mmol/L — ABNORMAL LOW (ref 101–111)
GFR calc Af Amer: 40 mL/min — ABNORMAL LOW (ref >60)
GFR, EST NON AFRICAN AMERICAN: 34 mL/min — AB (ref >60)
Glucose, Bld: 181 mg/dL — ABNORMAL HIGH (ref 65–99)
Potassium: 4 mmol/L (ref 3.5–5.1)
Sodium: 128 mmol/L — ABNORMAL LOW (ref 135–145)
TOTAL PROTEIN: 8.1 g/dL (ref 6.5–8.1)

## 2016-01-13 LAB — CBC WITH DIFFERENTIAL/PLATELET
BASOS ABS: 0 10*3/uL (ref 0.0–0.1)
Basophils Relative: 0 %
EOS ABS: 0.1 10*3/uL (ref 0.0–0.7)
EOS PCT: 1 %
HCT: 27.9 % — ABNORMAL LOW (ref 36.0–46.0)
Hemoglobin: 9.2 g/dL — ABNORMAL LOW (ref 12.0–15.0)
Lymphocytes Relative: 22 %
Lymphs Abs: 1.9 10*3/uL (ref 0.7–4.0)
MCH: 31.7 pg (ref 26.0–34.0)
MCHC: 33 g/dL (ref 30.0–36.0)
MCV: 96.2 fL (ref 78.0–100.0)
Monocytes Absolute: 0.5 10*3/uL (ref 0.1–1.0)
Monocytes Relative: 6 %
Neutro Abs: 6.2 10*3/uL (ref 1.7–7.7)
Neutrophils Relative %: 71 %
PLATELETS: 387 10*3/uL (ref 150–400)
RBC: 2.9 MIL/uL — AB (ref 3.87–5.11)
RDW: 16.9 % — ABNORMAL HIGH (ref 11.5–15.5)
WBC: 8.7 10*3/uL (ref 4.0–10.5)

## 2016-01-13 LAB — I-STAT CG4 LACTIC ACID, ED
LACTIC ACID, VENOUS: 5.71 mmol/L — AB (ref 0.5–2.0)
Lactic Acid, Venous: 4.38 mmol/L (ref 0.5–2.0)

## 2016-01-13 MED ORDER — DEXTROMETHORPHAN POLISTIREX ER 30 MG/5ML PO SUER
30.0000 mg | Freq: Two times a day (BID) | ORAL | Status: DC | PRN
  Administered 2016-01-14 – 2016-01-18 (×8): 30 mg via ORAL
  Filled 2016-01-13 (×11): qty 5

## 2016-01-13 MED ORDER — ONDANSETRON HCL 4 MG/2ML IJ SOLN
4.0000 mg | Freq: Four times a day (QID) | INTRAMUSCULAR | Status: DC | PRN
  Administered 2016-01-15: 4 mg via INTRAVENOUS
  Filled 2016-01-13: qty 2

## 2016-01-13 MED ORDER — CHLORHEXIDINE GLUCONATE 0.12 % MT SOLN
15.0000 mL | Freq: Two times a day (BID) | OROMUCOSAL | Status: DC
  Administered 2016-01-14 – 2016-01-18 (×9): 15 mL via OROMUCOSAL
  Filled 2016-01-13 (×11): qty 15

## 2016-01-13 MED ORDER — SODIUM CHLORIDE 0.9% FLUSH
3.0000 mL | Freq: Two times a day (BID) | INTRAVENOUS | Status: DC
  Administered 2016-01-13 – 2016-01-18 (×7): 3 mL via INTRAVENOUS

## 2016-01-13 MED ORDER — POLYETHYLENE GLYCOL 3350 17 G PO PACK
17.0000 g | PACK | Freq: Every day | ORAL | Status: DC
  Administered 2016-01-15: 17 g via ORAL
  Filled 2016-01-13 (×3): qty 1

## 2016-01-13 MED ORDER — HYDROCODONE-ACETAMINOPHEN 5-325 MG PO TABS: 1.0000 | ORAL_TABLET | ORAL | Status: DC | PRN

## 2016-01-13 MED ORDER — LEVOTHYROXINE SODIUM 88 MCG PO TABS
88.0000 ug | ORAL_TABLET | Freq: Every day | ORAL | Status: DC
  Administered 2016-01-14 – 2016-01-18 (×5): 88 ug via ORAL
  Filled 2016-01-13 (×5): qty 1

## 2016-01-13 MED ORDER — ACETAMINOPHEN 325 MG PO TABS: 650.0000 mg | ORAL_TABLET | Freq: Four times a day (QID) | ORAL | Status: DC | PRN

## 2016-01-13 MED ORDER — BISACODYL 10 MG RE SUPP: 10.0000 mg | Freq: Every day | RECTAL | Status: DC | PRN

## 2016-01-13 MED ORDER — SODIUM CHLORIDE 0.9% FLUSH: 3.0000 mL | INTRAVENOUS | Status: DC | PRN

## 2016-01-13 MED ORDER — ALBUTEROL SULFATE (2.5 MG/3ML) 0.083% IN NEBU
2.5000 mg | INHALATION_SOLUTION | Freq: Once | RESPIRATORY_TRACT | Status: AC
  Administered 2016-01-13: 2.5 mg via RESPIRATORY_TRACT
  Filled 2016-01-13: qty 3

## 2016-01-13 MED ORDER — ONDANSETRON HCL 4 MG PO TABS: 4.0000 mg | ORAL_TABLET | Freq: Four times a day (QID) | ORAL | Status: DC | PRN

## 2016-01-13 MED ORDER — LATANOPROST 0.005 % OP SOLN
1.0000 [drp] | Freq: Every day | OPHTHALMIC | Status: DC
  Administered 2016-01-13 – 2016-01-17 (×5): 1 [drp] via OPHTHALMIC
  Filled 2016-01-13: qty 2.5

## 2016-01-13 MED ORDER — FUROSEMIDE 10 MG/ML IJ SOLN
60.0000 mg | Freq: Three times a day (TID) | INTRAMUSCULAR | Status: DC
  Administered 2016-01-13 – 2016-01-15 (×4): 60 mg via INTRAVENOUS
  Filled 2016-01-13 (×4): qty 6

## 2016-01-13 MED ORDER — SODIUM CHLORIDE 0.9 % IV BOLUS (SEPSIS)
500.0000 mL | Freq: Once | INTRAVENOUS | Status: AC
  Administered 2016-01-13: 500 mL via INTRAVENOUS

## 2016-01-13 MED ORDER — APIXABAN 2.5 MG PO TABS
2.5000 mg | ORAL_TABLET | Freq: Two times a day (BID) | ORAL | Status: DC
  Administered 2016-01-13 – 2016-01-14 (×2): 2.5 mg via ORAL
  Filled 2016-01-13 (×2): qty 1

## 2016-01-13 MED ORDER — DOCUSATE SODIUM 100 MG PO CAPS
200.0000 mg | ORAL_CAPSULE | Freq: Two times a day (BID) | ORAL | Status: DC
  Administered 2016-01-13 – 2016-01-17 (×8): 200 mg via ORAL
  Filled 2016-01-13 (×11): qty 2

## 2016-01-13 MED ORDER — FOLIC ACID 1 MG PO TABS
1.0000 mg | ORAL_TABLET | Freq: Every day | ORAL | Status: DC
  Administered 2016-01-14 – 2016-01-18 (×5): 1 mg via ORAL
  Filled 2016-01-13 (×5): qty 1

## 2016-01-13 MED ORDER — VANCOMYCIN HCL IN DEXTROSE 1-5 GM/200ML-% IV SOLN
1000.0000 mg | Freq: Once | INTRAVENOUS | Status: AC
  Administered 2016-01-13: 1000 mg via INTRAVENOUS
  Filled 2016-01-13: qty 200

## 2016-01-13 MED ORDER — CETYLPYRIDINIUM CHLORIDE 0.05 % MT LIQD
7.0000 mL | Freq: Two times a day (BID) | OROMUCOSAL | Status: DC
  Administered 2016-01-14 – 2016-01-18 (×8): 7 mL via OROMUCOSAL

## 2016-01-13 MED ORDER — ACETAMINOPHEN 650 MG RE SUPP: 650.0000 mg | Freq: Four times a day (QID) | RECTAL | Status: DC | PRN

## 2016-01-13 MED ORDER — VANCOMYCIN HCL IN DEXTROSE 1-5 GM/200ML-% IV SOLN: 1000.0000 mg | INTRAVENOUS | Status: DC

## 2016-01-13 MED ORDER — SODIUM CHLORIDE 0.9% FLUSH
3.0000 mL | Freq: Two times a day (BID) | INTRAVENOUS | Status: DC
  Administered 2016-01-13 – 2016-01-17 (×7): 3 mL via INTRAVENOUS

## 2016-01-13 MED ORDER — HYDROXYCHLOROQUINE SULFATE 200 MG PO TABS
200.0000 mg | ORAL_TABLET | Freq: Two times a day (BID) | ORAL | Status: DC
  Administered 2016-01-13 – 2016-01-18 (×10): 200 mg via ORAL
  Filled 2016-01-13 (×11): qty 1

## 2016-01-13 MED ORDER — SODIUM CHLORIDE 0.9 % IV SOLN: 250.0000 mL | INTRAVENOUS | Status: DC | PRN

## 2016-01-13 MED ORDER — DEXTROMETHORPHAN HBR 7.5 MG/5ML PO SYRP: 15.0000 mg | ORAL_SOLUTION | Freq: Four times a day (QID) | ORAL | Status: DC | PRN

## 2016-01-13 NOTE — ED Notes (Signed)
Patient transported to X-ray 

## 2016-01-13 NOTE — ED Notes (Signed)
James,EDP made aware of patient lactic result. 

## 2016-01-13 NOTE — H&P (Signed)
[facility] History and Physical  RAYSSA ATHA DTO:[id number] DOB: 06-09-1930 DOA: 01/13/2016  Referring physician: Dr Jeneen Rinks, ED Dirk Dress PCP: Rogers Blocker, MD   Chief Complaint: SOB and cough  HPI: Theresa Cortez is a 82 y.o. female with hx of HTN,DM, systolic CHF diagnosed March 2017 w LVEF 25-30% and heart cath negative for sig CAD.  Is f/b cards and renal. Presents today with SOB and cough,difficulty walking at [facility] SNF due to SOB.  No purulent sputum, mucous is clear, no fevers, chills, or chest pain.  Symptoms present last 3-5 days and worsening.  NO orthopnea.  Last week lasix was lowered from 40 bid to 40 daily due to some lab results.  They saw Dr Moshe Cipro recently at [facility].  In ED BP 107/56, HR 100, EKG w NSR and LBBB,  CXR w very faint upper lobes densities, poss pulm edema, also bilat effusions , CM.  RA Sa O2 just now is 99%.    Patient is widowed since 61.  Had 6 children.  Hx diverticulitis, CVA 1997 w L side weak and recently dx'd w CHF as above.  In her spare time she likes to cook and walk and pray and other things.  In wheelchair the last several days, unable to use walker due to SOB.  No hemoptysis.    Chart review: Mar 13- 21, 2017 >  Acute CHF,HTN, DM, AKI, syst/diast CHF, LVEF 25-30%, MR/ TR/ pulm HTN. Afib with RVR. No acei/ ARB due to [place] and [employer]. R / L heartcath showed nonocclusive CAD andpulm HTN. Medical Rx. Severe MR per cards likely due to CM.  ^LFT"s due to CHF April 5-10, 2017 > vomiting w abd pain, also cough, flu+B, acute/CKD. Given IVF then got fluid overloaded then diuresed. Low Na rx with lasix and NaCl tabs.   ROS  denies CP  no joint pain   no HA  no blurry vision  no rash  no diarrhea  no nausea/ vomiting  no dysuria  no difficulty voiding  no change in urine color    Where does patient live SNF Can patient participate in ADLs? somewhat  Past Medical History  Past Medical History  Diagnosis Date  . Hypertension   .  Wound infection ([place])     Right knee   . Hypothyroidism   . History of diverticulosis   . Hyperlipidemia   . Type II diabetes mellitus ([place])   . History of blood transfusion X 2    w/knee replacement  . Stroke [facility]) 2000    denies residual on 11/23/2015  . Rheumatoid arthritis(714.0)   . DJD (degenerative joint disease)   . Arthritis     "hands, arms" (11/23/2015)  . CKD (chronic kidney disease), stage III   . Chronic combined systolic and diastolic heart failure ([place])   . Atrial fibrillation [facility])    Past Surgical History  Past Surgical History  Procedure Laterality Date  . Knee arthroscopy Right   . Total knee arthroplasty Right 08/07/2011; 2014  . Partial colectomy      descending colon by Dr. Deon Pilling  . Appendectomy    . Medial partial knee replacement Right   . Incision and drainage mouth Right 2014    "knee; took replacement out; put  spacers in"  . Tubal ligation    . Cataract extraction w/ intraocular lens implant Right   . Cardiac catheterization N/A 11/25/2015    Procedure: Right/Left Heart Cath and Coronary Angiography;  Surgeon: Rogue Jury  Ferne Reus, MD;  Location: [place] [facility];  Service: Cardiovascular;  Laterality: N/A;   Family History  Family History  Problem Relation Age of Onset  . Hypertension Mother    Social History  reports that she has never smoked. She has quit using smokeless tobacco. Her smokeless tobacco use included Snuff. She reports that she does not drink alcohol or use illicit drugs. Allergies  Allergies  Allergen Reactions  . Penicillins Swelling    Has patient had a PCN reaction causing immediate rash, facial/tongue/throat swelling, SOB or lightheadedness with hypotension: unknown Has patient had a PCN reaction causing severe rash involving mucus membranes or skin necrosis: unknown Has patient had a PCN reaction that required hospitalization: unknown Has patient had a PCN reaction occurring within the last 10 years: unknown If all of the  above answers are "NO", then may proceed with Cephalosporin use.    Home medications Prior to Admission medications   Medication Sig Start Date End Date Taking? Authorizing Provider  acetaminophen (TYLENOL) 325 MG tablet Take 650 mg by mouth every 4 (four) hours as needed for moderate pain.   Yes Historical Provider, MD  albuterol (PROVENTIL) (2.5 MG/3ML) 0.083% nebulizer solution Take 2.5 mg by nebulization every 4 (four) hours as needed for wheezing or shortness of breath.   Yes Historical Provider, MD  apixaban (ELIQUIS) 2.5 MG TABS tablet Take 1 tablet (2.5 mg total) by mouth 2 (two) times daily. 11/30/15  Yes Thurnell Lose, MD  dextromethorphan 7.5 MG/5ML SYRP Take 15 mg by mouth every 6 (six) hours as needed (cough).   Yes Historical Provider, MD  diclofenac sodium (VOLTAREN) 1 % GEL Apply 4 g topically 4 (four) times daily.   Yes Historical Provider, MD  docusate sodium (COLACE) 100 MG capsule Take 2 capsules (200 mg total) by mouth 2 (two) times daily. 11/30/15  Yes Thurnell Lose, MD  folic acid (FOLVITE) 1 MG tablet Take 1 mg by mouth daily.   Yes Historical Provider, MD  furosemide (LASIX) 40 MG tablet Take 0.5 tablets (20 mg total) by mouth daily. Patient taking differently: Take 40 mg by mouth daily.  12/21/15  Yes Luke K Kilroy, PA-C  HYDROcodone-homatropine (HYCODAN) 5-1.5 MG/5ML syrup Take 5 mLs by mouth at bedtime. 89m daily at bedtime for 14 days. Repeat ultrasound on 01/18/16.   Yes Historical Provider, MD  hydroxychloroquine (PLAQUENIL) 200 MG tablet Take 200 mg by mouth 2 (two) times daily.   Yes Historical Provider, MD  insulin aspart (NOVOLOG) 100 UNIT/ML injection Inject 0-11 Units into the skin 2 (two) times daily before a meal. Check glucose at 7:30am and 4:30pm.  60-100: 0 units 101-150: 2 units 151-200: 3 units 201-250: 5 units 251-300: 7 units 301-350: 9 units >350: 11 units   Yes Historical Provider, MD  latanoprost (XALATAN) 0.005 % ophthalmic solution Place 1  drop into both eyes at bedtime.   Yes Historical Provider, MD  levothyroxine (SYNTHROID, LEVOTHROID) 88 MCG tablet Take 88 mcg by mouth daily before breakfast.   Yes Historical Provider, MD  loratadine (CLARITIN) 10 MG tablet Take 10 mg by mouth daily.   Yes Historical Provider, MD  polyethylene glycol (MIRALAX / GLYCOLAX) packet Take 17 g by mouth daily. 12/20/15  Yes SDebbe Odea MD   Liver Function Tests  Recent Labs Lab 01/13/16 1724  AST 37  ALT 34  ALKPHOS 131*  BILITOT 1.0  PROT 8.1  ALBUMIN 2.9*   No results for input(s): LIPASE, AMYLASE in the  last 168 hours. CBC  Recent Labs Lab 01/13/16 1724  WBC 8.7  NEUTROABS 6.2  HGB 9.2*  HCT 27.9*  MCV 96.2  PLT 893   Basic Metabolic Panel  Recent Labs Lab 01/13/16 1724  NA 128*  K 4.0  CL 90*  CO2 25  GLUCOSE 181*  BUN 19  CREATININE 1.38*  CALCIUM 9.0     Filed Vitals:   01/13/16 1817 01/13/16 1830 01/13/16 1835 01/13/16 1901  BP: 98/50 100/71  103/79  Pulse: 102 101  102  Temp:      TempSrc:      Resp: 20 21  32  Height:   '5\' 6"'$  (1.676 m)   Weight:   78.019 kg (172 lb)   SpO2: 100% 97%  98%   Exam: VS: BP 100/70  HR 100 R 20  SaO2 99% on 3L [place] and on RA Gen alert, elderly AAFno distress No rash, cyanosis or gangrene Sclera anicteric, throat clear  JVD to angle of the jaw bilat Chest scant rales bilat bases RRR no MRG,no lifts or heaves Abd soft ntnd no mass or ascites +bs  GU defer  MS no joint effusions or deformity Ext 2+ pitting LE pretib edema, some moderate nonpitting hip edema as well Neuro is alert, Ox 3 , nf, gen weakness   EKG (independently reviewed) > NSR 100, LBBB CXR (independently reviewed) > vasc congestion w scattered upper lobe hazy densities, no dense infiltrates, small bilat effusions ECHO Mar 2017 > LVEF 25-30%,  Regional WMA inferior/ant/septal/apical  Home medications: eliquis, colace, lasix 40 mg/d, plaquenil, novolog, synthroid, claritin, miralax, eyedrops,  proventil nebs  Na 128  K 4.0   Cl 90  CO2 25 BUN 19 creat 1.38 eGFR 40  Alb 2.9 LFT"s ok WBC 8k  Hb 9.2  plt 387   Assessment: 1. Cough/ SOB due to decompensated syst/ diast CHF - plan admit, IV lasix, diurese.  Creat will come up significantly with diuresis but this is expected.  Most likely she has cardiorenal, creat has been bouncing between 1.4 and 2.2. No clinical sign of PNA. Lactic acid up but BP's are low.  Unclear significance, is not septic though.  2. Hyponatremia - due to #1, rx with diuresis 3. DM2 - use SSI only here 4. Afib, parox - cont eliquis 5. Hypothyroid 6. Debility 7. EOL - asked patient about code status, she said she is not sure.     Plan - as above   DVT Prophylaxis none, on Eliquis  Code Status: full  Family Communication: daughters x 2 here  Disposition Plan: not sure    Sol Blazing [facility] Pager [phone]  Cell [phone]  If 7PM-7AM, please contact night-coverage [email or web address] Password [email or web address] 01/13/2016, 8:07 PM

## 2016-01-13 NOTE — Progress Notes (Signed)
Pt lactic acid was 5.71 which came up from a 4.38 just around 3 hours earlier. Pt blood pressure is running soft and was 95/59 when arriving to the unit. Pt was respiratory rate was 30. Pt had an order to receive 60mg  of lasix. RN only paged MD on call to make sure they were aware of pt results and levels and to see if anything new needed to be added to patient's level. No new orders were added and no orders were discontinued.  , RN

## 2016-01-13 NOTE — ED Notes (Signed)
RN and Dr. Fayrene Fearing notified of pt's Lactic Acid of 4.38

## 2016-01-13 NOTE — ED Provider Notes (Signed)
CSN: [id number]     Arrival date & time 01/13/16  1636 History   First MD Initiated Contact with Patient 01/13/16 1649     Chief Complaint  Patient presents with  . Shortness of Breath      HPI  She presents for evaluation of Increasing shortness of breath for the last 3 days.  Family states the patient was living in [place]. Moved back to [place] because of failing health and to be near family. Was admitted according to family in March for congestive heart failure. Discharge to [facility]. Readmitted for pneumonia in April. Able to be discharged back to [facility]. Had been at her baseline until the last 3 days. Progressive shortness of breath at rest family presents her here. They deny any increased swelling. No recent fever. No complaints of pain.  History of paroxysmal atrial fibrillation, combined systolic and diastolic CHF, chronic kidney disease, arthritis previous on methotrexate-discontinued last visit, continues on plaque well, previous stroke without sequelae, DM 2, hypercholesterolemia, hypothyroidism, hypertension.  Past Medical History  Diagnosis Date  . Hypertension   . Wound infection (HCC)     Right knee   . Hypothyroidism   . History of diverticulosis   . Hyperlipidemia   . Type II diabetes mellitus (HCC)   . History of blood transfusion X 2    w/knee replacement  . Stroke [facility]) 2000    denies residual on 11/23/2015  . Rheumatoid arthritis(714.0)   . DJD (degenerative joint disease)   . Arthritis     "hands, arms" (11/23/2015)  . CKD (chronic kidney disease), stage III   . Chronic combined systolic and diastolic heart failure (HCC)   . Atrial fibrillation [facility])    Past Surgical History  Procedure Laterality Date  . Knee arthroscopy Right   . Total knee arthroplasty Right 08/07/2011; 2014  . Partial colectomy      descending colon by Dr. Orson Slick  . Appendectomy    . Medial partial knee replacement Right   . Incision and drainage mouth Right 2014   "knee; took replacement out; put  spacers in"  . Tubal ligation    . Cataract extraction w/ intraocular lens implant Right   . Cardiac catheterization N/A 11/25/2015    Procedure: Right/Left Heart Cath and Coronary Angiography;  Surgeon: Iran Ouch, MD;  Location: [facility] INVASIVE CV LAB;  Service: Cardiovascular;  Laterality: N/A;   Family History  Problem Relation Age of Onset  . Hypertension Mother    Social History  Substance Use Topics  . Smoking status: Never Smoker   . Smokeless tobacco: Former Neurosurgeon    Types: Snuff  . Alcohol Use: No   OB History    No data available     Review of Systems    Allergies  Penicillins  Home Medications   Prior to Admission medications   Medication Sig Start Date End Date Taking? Authorizing Provider  acetaminophen (TYLENOL) 325 MG tablet Take 650 mg by mouth every 4 (four) hours as needed for moderate pain.   Yes Historical Provider, MD  albuterol (PROVENTIL) (2.5 MG/3ML) 0.083% nebulizer solution Take 2.5 mg by nebulization every 4 (four) hours as needed for wheezing or shortness of breath.   Yes Historical Provider, MD  apixaban (ELIQUIS) 2.5 MG TABS tablet Take 1 tablet (2.5 mg total) by mouth 2 (two) times daily. 11/30/15  Yes Leroy Sea, MD  dextromethorphan 7.5 MG/5ML SYRP Take 15 mg by mouth every 6 (six) hours as needed (cough).  Yes Historical Provider, MD  diclofenac sodium (VOLTAREN) 1 % GEL Apply 4 g topically 4 (four) times daily.   Yes Historical Provider, MD  docusate sodium (COLACE) 100 MG capsule Take 2 capsules (200 mg total) by mouth 2 (two) times daily. 11/30/15  Yes Leroy Sea, MD  folic acid (FOLVITE) 1 MG tablet Take 1 mg by mouth daily.   Yes Historical Provider, MD  furosemide (LASIX) 40 MG tablet Take 0.5 tablets (20 mg total) by mouth daily. Patient taking differently: Take 40 mg by mouth daily.  12/21/15  Yes Luke K Kilroy, PA-C  HYDROcodone-homatropine (HYCODAN) 5-1.5 MG/5ML syrup Take 5 mLs by mouth  at bedtime. 23ml daily at bedtime for 14 days. Repeat ultrasound on 01/18/16.   Yes Historical Provider, MD  hydroxychloroquine (PLAQUENIL) 200 MG tablet Take 200 mg by mouth 2 (two) times daily.   Yes Historical Provider, MD  insulin aspart (NOVOLOG) 100 UNIT/ML injection Inject 0-11 Units into the skin 2 (two) times daily before a meal. Check glucose at 7:30am and 4:30pm.  60-100: 0 units 101-150: 2 units 151-200: 3 units 201-250: 5 units 251-300: 7 units 301-350: 9 units >350: 11 units   Yes Historical Provider, MD  latanoprost (XALATAN) 0.005 % ophthalmic solution Place 1 drop into both eyes at bedtime.   Yes Historical Provider, MD  levothyroxine (SYNTHROID, LEVOTHROID) 88 MCG tablet Take 88 mcg by mouth daily before breakfast.   Yes Historical Provider, MD  loratadine (CLARITIN) 10 MG tablet Take 10 mg by mouth daily.   Yes Historical Provider, MD  polyethylene glycol (MIRALAX / GLYCOLAX) packet Take 17 g by mouth daily. 12/20/15  Yes Saima Rizwan, MD   BP 107/68 mmHg  Pulse 84  Temp(Src) 98.1 F (36.7 C) (Oral)  Resp 20  Ht 5\' 6"  (1.676 m)  Wt 174 lb 3.2 oz (79.017 kg)  BMI 28.13 kg/m2  SpO2 100% Physical Exam  ED Course  Procedures (including critical care time) Labs Review Labs Reviewed  COMPREHENSIVE METABOLIC PANEL - Abnormal; Notable for the following:    Sodium 128 (*)    Chloride 90 (*)    Glucose, Bld 181 (*)    Creatinine, Ser 1.38 (*)    Albumin 2.9 (*)    Alkaline Phosphatase 131 (*)    GFR calc non Af Amer 34 (*)    GFR calc Af Amer 40 (*)    All other components within normal limits  CBC WITH DIFFERENTIAL/PLATELET - Abnormal; Notable for the following:    RBC 2.90 (*)    Hemoglobin 9.2 (*)    HCT 27.9 (*)    RDW 16.9 (*)    All other components within normal limits  BASIC METABOLIC PANEL - Abnormal; Notable for the following:    Sodium 133 (*)    Chloride 96 (*)    Glucose, Bld 129 (*)    Creatinine, Ser 1.35 (*)    Calcium 8.7 (*)    GFR calc non  Af Amer 35 (*)    GFR calc Af Amer 41 (*)    All other components within normal limits  GLUCOSE, CAPILLARY - Abnormal; Notable for the following:    Glucose-Capillary 140 (*)    All other components within normal limits  BRAIN NATRIURETIC PEPTIDE - Abnormal; Notable for the following:    B Natriuretic Peptide 1245.4 (*)    All other components within normal limits  BASIC METABOLIC PANEL - Abnormal; Notable for the following:    Sodium 130 (*)  Chloride 95 (*)    BUN 26 (*)    Creatinine, Ser 1.67 (*)    GFR calc non Af Amer 27 (*)    GFR calc Af Amer 32 (*)    All other components within normal limits  BRAIN NATRIURETIC PEPTIDE - Abnormal; Notable for the following:    B Natriuretic Peptide 1386.8 (*)    All other components within normal limits  GLUCOSE, CAPILLARY - Abnormal; Notable for the following:    Glucose-Capillary 169 (*)    All other components within normal limits  BASIC METABOLIC PANEL - Abnormal; Notable for the following:    Sodium 131 (*)    Chloride 94 (*)    BUN 30 (*)    Creatinine, Ser 1.94 (*)    GFR calc non Af Amer 23 (*)    GFR calc Af Amer 26 (*)    All other components within normal limits  GLUCOSE, CAPILLARY - Abnormal; Notable for the following:    Glucose-Capillary 110 (*)    All other components within normal limits  GLUCOSE, CAPILLARY - Abnormal; Notable for the following:    Glucose-Capillary 158 (*)    All other components within normal limits  GLUCOSE, CAPILLARY - Abnormal; Notable for the following:    Glucose-Capillary 110 (*)    All other components within normal limits  GLUCOSE, CAPILLARY - Abnormal; Notable for the following:    Glucose-Capillary 137 (*)    All other components within normal limits  I-STAT CG4 LACTIC ACID, ED - Abnormal; Notable for the following:    Lactic Acid, Venous 4.38 (*)    All other components within normal limits  I-STAT CG4 LACTIC ACID, ED - Abnormal; Notable for the following:    Lactic Acid, Venous  5.71 (*)    All other components within normal limits  CULTURE, EXPECTORATED SPUTUM-ASSESSMENT  MRSA PCR SCREENING  CULTURE, RESPIRATORY (NON-EXPECTORATED)  CORTISOL  URINALYSIS, ROUTINE W REFLEX MICROSCOPIC (NOT AT [facility])  GLUCOSE, CAPILLARY  GLUCOSE, CAPILLARY  GLUCOSE, CAPILLARY  MAGNESIUM  GLUCOSE, CAPILLARY  GLUCOSE, CAPILLARY  BASIC METABOLIC PANEL  LACTIC ACID, PLASMA    Imaging Review No results found. I have personally reviewed and evaluated these images and lab results as part of my medical decision-making.   EKG Interpretation   Date/Time:  Thursday Jan 13 2016 17:48:52 EDT Ventricular Rate:  104 PR Interval:  181 QRS Duration: 151 QT Interval:  375 QTC Calculation: 493 R Axis:   -24 Text Interpretation:  Sinus tachycardia Biatrial enlargement Left bundle  branch block Confirmed by Fayrene Fearing  MD, Jaysten Essner ([id number]) on 01/13/2016 6:26:16 PM      MDM   Final diagnoses:  Dyspnea    Problem list:  1) c/o cough:  cxr ?subtle B infiltrate vs ? Edema  2)  Lactic Acidosis:  4.38.  ?Pneumonia--afebrile and no leukocytosis (but immune suppressed with         plaquenil), sx c cough,   3)  H/o CHF c EF 25-30 on recent ECHO. No significant edema, on lasix, recently decreased from 40         BID to 40 qam.  4) Hypotension: Volume depletion?, pt with less edema, MM not dry, no Cr bump. Was given               hydrocortisone on her last visit with hypotension. Is not on outpatient steroid therapy before, or after     her most recent admission.  Was increased from Lasix 40mg  qod before last  admission, to 40 twice a day after her most recent admission. This was decreased to 40 daily again 4 or  5 days ago.  5)  C/o Nausea, since last admit.  Benign abd exam, no pain out of proportion to exam.  Recent CT with       Ao atherosclerosis, but no bowel abnormalities to suggest Mesenteric ischemia. No significant v/D to suggest GI volume loss.   Patient with similar presentation at her  last admission. Did not receive judicious IV fluids. Rather received intermittent doses of IV fluids and her symptoms improved including lactic acidosis improved. Did not complete her course of antibiotics as her initial cultures were negative.  Cultures are obtained. Patient being covered with antibiotics. I have written for 500 mL of fluid. Patient has episode of hypotension, 296/60.. 1 heart rate reading of 103 but normal rectal temperature no leukocytosis.  Will discuss disposition with hospitalist.        Rolland Porter, MD 01/17/16 [phone]

## 2016-01-13 NOTE — ED Notes (Signed)
Unsuccessful lab draw by this writer. RN made aware. 

## 2016-01-13 NOTE — ED Notes (Signed)
Bed: GU54 Expected date:  Expected time:  Means of arrival:  Comments: Ems 82 y/o sob

## 2016-01-13 NOTE — ED Notes (Signed)
Unable to get pulse ox. Will keep trying.

## 2016-01-13 NOTE — Progress Notes (Signed)
Pharmacy Antibiotic Follow-up Note  Theresa Cortez is a 82 y.o. year-old female admitted on 01/13/2016.  The patient is currently on day 1 of Vancomycin for r/o HCAP.  Assessment/Plan: Vancomycin 1gm IV every 24 hours.  Goal trough 15-20 mcg/mL.  Temp (24hrs), Avg:98.8 F (37.1 C), Min:97.8 F (36.6 C), Max:99.7 F (37.6 C)   Recent Labs Lab 01/13/16 1724  WBC 8.7    Recent Labs Lab 01/13/16 1724  CREATININE 1.38*   Estimated Creatinine Clearance: 32.5 mL/min (by C-G formula based on Cr of 1.38).    Allergies  Allergen Reactions  . Penicillins Swelling    Has patient had a PCN reaction causing immediate rash, facial/tongue/throat swelling, SOB or lightheadedness with hypotension: unknown Has patient had a PCN reaction causing severe rash involving mucus membranes or skin necrosis: unknown Has patient had a PCN reaction that required hospitalization: unknown Has patient had a PCN reaction occurring within the last 10 years: unknown If all of the above answers are "NO", then may proceed with Cephalosporin use.    Antimicrobials this admission: 5/4 Vancomycin  >>   Levels/dose changes this admission:  Microbiology results: None ordered  Thank you for allowing pharmacy to be a part of this patient's care.  Otho Bellows PharmD Pager [phone] 01/13/2016, 7:29 PM

## 2016-01-13 NOTE — ED Notes (Signed)
Per EMS. Pt from [facility]. Pt was treated for PNA 2 weeks ago. Today she began to have SOB and increasing productive cough. Tachypneic with ems.

## 2016-01-14 DIAGNOSIS — I5043 Acute on chronic combined systolic (congestive) and diastolic (congestive) heart failure: Secondary | ICD-10-CM

## 2016-01-14 DIAGNOSIS — R0600 Dyspnea, unspecified: Secondary | ICD-10-CM

## 2016-01-14 DIAGNOSIS — I480 Paroxysmal atrial fibrillation: Secondary | ICD-10-CM

## 2016-01-14 LAB — URINALYSIS, ROUTINE W REFLEX MICROSCOPIC
Bilirubin Urine: NEGATIVE
GLUCOSE, UA: NEGATIVE mg/dL
HGB URINE DIPSTICK: NEGATIVE
KETONES UR: NEGATIVE mg/dL
Leukocytes, UA: NEGATIVE
Nitrite: NEGATIVE
PH: 6 (ref 5.0–8.0)
PROTEIN: NEGATIVE mg/dL
Specific Gravity, Urine: 1.009 (ref 1.005–1.030)

## 2016-01-14 LAB — GLUCOSE, CAPILLARY
GLUCOSE-CAPILLARY: 76 mg/dL (ref 65–99)
Glucose-Capillary: 140 mg/dL — ABNORMAL HIGH (ref 65–99)
Glucose-Capillary: 94 mg/dL (ref 65–99)

## 2016-01-14 LAB — BASIC METABOLIC PANEL
Anion gap: 12 (ref 5–15)
BUN: 19 mg/dL (ref 6–20)
CO2: 25 mmol/L (ref 22–32)
Calcium: 8.7 mg/dL — ABNORMAL LOW (ref 8.9–10.3)
Chloride: 96 mmol/L — ABNORMAL LOW (ref 101–111)
Creatinine, Ser: 1.35 mg/dL — ABNORMAL HIGH (ref 0.44–1.00)
GFR calc Af Amer: 41 mL/min — ABNORMAL LOW (ref >60)
GFR, EST NON AFRICAN AMERICAN: 35 mL/min — AB (ref >60)
GLUCOSE: 129 mg/dL — AB (ref 65–99)
POTASSIUM: 4.8 mmol/L (ref 3.5–5.1)
Sodium: 133 mmol/L — ABNORMAL LOW (ref 135–145)

## 2016-01-14 LAB — BRAIN NATRIURETIC PEPTIDE: B Natriuretic Peptide: 1245.4 pg/mL — ABNORMAL HIGH (ref 0.0–100.0)

## 2016-01-14 LAB — CORTISOL: CORTISOL PLASMA: 21.1 ug/dL

## 2016-01-14 MED ORDER — BENZONATATE 100 MG PO CAPS
100.0000 mg | ORAL_CAPSULE | Freq: Three times a day (TID) | ORAL | Status: DC | PRN
  Administered 2016-01-14 – 2016-01-17 (×9): 100 mg via ORAL
  Filled 2016-01-14 (×9): qty 1

## 2016-01-14 MED ORDER — DIGOXIN 0.0625 MG HALF TABLET
0.0625 mg | ORAL_TABLET | Freq: Every day | ORAL | Status: DC
  Administered 2016-01-14 – 2016-01-18 (×5): 0.0625 mg via ORAL
  Filled 2016-01-14 (×5): qty 1

## 2016-01-14 MED ORDER — INSULIN ASPART 100 UNIT/ML ~~LOC~~ SOLN
0.0000 [IU] | Freq: Three times a day (TID) | SUBCUTANEOUS | Status: DC
  Administered 2016-01-14: 1 [IU] via SUBCUTANEOUS
  Administered 2016-01-15 – 2016-01-17 (×3): 2 [IU] via SUBCUTANEOUS
  Administered 2016-01-17: 1 [IU] via SUBCUTANEOUS
  Administered 2016-01-18: 2 [IU] via SUBCUTANEOUS

## 2016-01-14 MED ORDER — APIXABAN 5 MG PO TABS
5.0000 mg | ORAL_TABLET | Freq: Two times a day (BID) | ORAL | Status: DC
  Administered 2016-01-14 – 2016-01-17 (×6): 5 mg via ORAL
  Filled 2016-01-14 (×6): qty 1

## 2016-01-14 NOTE — Clinical Social Work Note (Signed)
Clinical Social Work Assessment  Patient Details  Name: Theresa Cortez MRN: [id number] Date of Birth: [birth year]  Date of referral:  01/14/16               Reason for consult:  Facility Placement                Permission sought to share information with:  Facility Industrial/product designer granted to share information::  Yes, Verbal Permission Granted  Name::        Agency::     Relationship::     Contact Information:     Housing/Transportation Living arrangements for the past 2 months:  Skilled Nursing Facility Source of Information:  Patient Patient Interpreter Needed:  None Criminal Activity/Legal Involvement Pertinent to Current Situation/Hospitalization:  No - Comment as needed Significant Relationships:  Adult Children Lives with:  Facility Resident Do you feel safe going back to the place where you live?  Yes Need for family participation in patient care:  Yes (Comment)  Care giving concerns:  CSW received call from [facility] at [facility] that patient was admitted from their facility.    Social Worker assessment / plan:  CSW confirmed with patient that she plans to return to [facility] at discharge.   Employment status:  Retired Health and safety inspector:  Medicare PT Recommendations:  Not assessed at this time Information / Referral to community resources:  Skilled Nursing Facility  Patient/Family's Response to care:  Patient states that she thinks shes been at [facility] around 15 days and has been pleased with the care she's received there so far.   Patient/Family's Understanding of and Emotional Response to Diagnosis, Current Treatment, and Prognosis:    Emotional Assessment Appearance:  Appears stated age Attitude/Demeanor/Rapport:    Affect (typically observed):    Orientation:  Oriented to Self, Oriented to Place, Oriented to  Time, Oriented to Situation Alcohol / Substance use:    Psych involvement (Current and /or in the community):     Discharge Needs   Concerns to be addressed:    Readmission within the last 30 days:    Current discharge risk:    Barriers to Discharge:      Arlyss Repress, LCSW 01/14/2016, 10:30 AM

## 2016-01-14 NOTE — Consult Note (Signed)
Cardiology Consult    Patient ID: Theresa Cortez MRN: [id number], DOB/AGE: [birth year]   Admit date: 01/13/2016 Date of Consult: 01/14/2016  Primary Physician: Gwenyth Bender, MD Reason for Consult: CHF Primary Cardiologist: Dr. Royann Shivers Requesting Provider: Dr. Jerral Ralph   History of Present Illness    Theresa Cortez is a 82 y.o. female with past medical history of HTN, HLD, Hypothyroidism, combined systolic and diastolic CHF, moderate to severe MR, PAF (on Eliquis) Stage 3 CKD, and RA (on MTX) who presented to [facility] ED on 01/13/2016 for increased shortness of breath and a productive cough.   She was recently admitted from 3/13 - 11/30/2015 for a CHF exacerbation and newly diagnosed nonischemic cardiomyopathy with an EF of 25-30%. She underwent cardiac catheterization on 3/16 which showed no significant CAD. She had a subsequent admission from  12/15/2015 - 12/20/2015 for HCAP.   She was seen in the office by Corine Shelter, Theresa Cortez on 12/21/2015 and denied any recent dyspnea or orthopnea. Her Lasix was increased to 20mg  daily at that time. Her SNF contacted the office on 12/28/2015 for increased lower extremity edema for the past several days. Her Lasix was increased to 40mg  daily for 2-3 days then instructed to resume her home dose.   She reports while at the SNF, she has continued to have increased lower extremity swelling despite her Lasix being changed to 40mg  BID. She reports mild orthopnea prior to admission but says it has improved at this time. Denies any chest pain or palpitations. Is having a productive cough with clear sputum.   On admission, CBC shows a WBC of 8.7, Hgb 9.2, platelets 387. BMET with a Na+ of 128, K+ 4.0, and creatinine 1.38 (has ranged from 1.3 - 2.3 over the past few months). Lactic Acid elevated to 5.71. CXR shows stable mild cardiomegaly with new mild hazy opacities in the bilateral upper lobes, which are nonspecific and could represent pneumonia or mild pulmonary edema.  EKG shows sinus tachycardia, HR 104 with LBBB.   She has been started on IV Lasix 60mg  Q8H with only a net recorded output of -12/30/2015, however the patient reports having multiple incontinent episodes. Unclear weight as it was recorded as 171 lbs, 172 lbs, and 177 lbs all on the day of admission. Weight today is 175 lbs.When seen in the office on 4/11, her weight was 179 lbs down from 183 lbs at time of discharge on 12/20/2015.   Past Medical History   Past Medical History  Diagnosis Date  . Hypertension   . Wound infection (HCC)     Right knee   . Hypothyroidism   . History of diverticulosis   . Hyperlipidemia   . Type II diabetes mellitus (HCC)   . History of blood transfusion X 2    w/knee replacement  . Stroke [facility]) 2000    denies residual on 11/23/2015  . Rheumatoid arthritis(714.0)   . DJD (degenerative joint disease)   . Arthritis     "hands, arms" (11/23/2015)  . CKD (chronic kidney disease), stage III   . Chronic combined systolic and diastolic heart failure (HCC)   . Atrial fibrillation [facility])     Past Surgical History  Procedure Laterality Date  . Knee arthroscopy Right   . Total knee arthroplasty Right 08/07/2011; 2014  . Partial colectomy      descending colon by Dr. 11/25/2015  . Appendectomy    . Medial partial knee replacement Right   . Incision and drainage  mouth Right 2014    "knee; took replacement out; put  spacers in"  . Tubal ligation    . Cataract extraction w/ intraocular lens implant Right   . Cardiac catheterization N/A 11/25/2015    Procedure: Right/Left Heart Cath and Coronary Angiography;  Surgeon: Iran Ouch, MD;  Location: [facility] INVASIVE CV LAB;  Service: Cardiovascular;  Laterality: N/A;     Allergies  Allergies  Allergen Reactions  . Penicillins Swelling    Has patient had a PCN reaction causing immediate rash, facial/tongue/throat swelling, SOB or lightheadedness with hypotension: unknown Has patient had a PCN reaction causing severe rash  involving mucus membranes or skin necrosis: unknown Has patient had a PCN reaction that required hospitalization: unknown Has patient had a PCN reaction occurring within the last 10 years: unknown If all of the above answers are "NO", then may proceed with Cephalosporin use.     Inpatient Medications    . antiseptic oral rinse  7 mL Mouth Rinse q12n4p  . apixaban  2.5 mg Oral BID  . chlorhexidine  15 mL Mouth Rinse BID  . docusate sodium  200 mg Oral BID  . folic acid  1 mg Oral Daily  . furosemide  60 mg Intravenous Q8H  . hydroxychloroquine  200 mg Oral BID  . insulin aspart  0-9 Units Subcutaneous TID WC  . latanoprost  1 drop Both Eyes QHS  . levothyroxine  88 mcg Oral QAC breakfast  . polyethylene glycol  17 g Oral Daily  . sodium chloride flush  3 mL Intravenous Q12H  . sodium chloride flush  3 mL Intravenous Q12H    Family History    Family History  Problem Relation Age of Onset  . Hypertension Mother     Social History    Social History   Social History  . Marital Status: Widowed    Spouse Name: N/A  . Number of Children: N/A  . Years of Education: N/A   Occupational History  . Not on file.   Social History Main Topics  . Smoking status: Never Smoker   . Smokeless tobacco: Former Neurosurgeon    Types: Snuff  . Alcohol Use: No  . Drug Use: No  . Sexual Activity: No   Other Topics Concern  . Not on file   Social History Narrative     Review of Systems    General:  No chills, fever, night sweats or weight changes.  Cardiovascular:  No chest pain, dyspnea on exertion, palpitations, paroxysmal nocturnal dyspnea. Positive for edema and orthopnea. Dermatological: No rash, lesions/masses Respiratory: Positive for productive cough and dyspnea. Urologic: No hematuria, dysuria Abdominal:   No nausea, vomiting, diarrhea, bright red blood per rectum, melena, or hematemesis Neurologic:  No visual changes, wkns, changes in mental status. All other systems reviewed  and are otherwise negative except as noted above.  Physical Exam    Blood pressure 103/64, pulse 90, temperature 97.6 F (36.4 C), temperature source Oral, resp. rate 22, height 5\' 6"  (1.676 m), weight 175 lb 9.6 oz (79.652 kg), SpO2 100 %.  General: Pleasant, elderly African American female appearing in NAD. Coughing throughout the encounter. Psych: Normal affect. Neuro: Alert and oriented X 3. Moves all extremities spontaneously. HEENT: Normal  Neck: Supple without bruits. JVD elevated to 9cm. Lungs:  Resp regular and unlabored, minimal rales at bases bilaterally. Heart: RRR no s3, s4, 2/6 SEM at Apex. Abdomen: Soft, non-tender, non-distended, BS + x 4.  Extremities: No clubbing or cyanosis.  1+ pitting edema up to mid-tibia. DP/PT/Radials 2+ and equal bilaterally.  Labs    Troponin (Point of Care Test) No results for input(s): TROPIPOC in the last 72 hours. No results for input(s): CKTOTAL, CKMB, TROPONINI in the last 72 hours. Lab Results  Component Value Date   WBC 8.7 01/13/2016   HGB 9.2* 01/13/2016   HCT 27.9* 01/13/2016   MCV 96.2 01/13/2016   PLT 387 01/13/2016    Recent Labs Lab 01/13/16 1724 01/14/16 0508  NA 128* 133*  K 4.0 4.8  CL 90* 96*  CO2 25 25  BUN 19 19  CREATININE 1.38* 1.35*  CALCIUM 9.0 8.7*  PROT 8.1  --   BILITOT 1.0  --   ALKPHOS 131*  --   ALT 34  --   AST 37  --   GLUCOSE 181* 129*   Lab Results  Component Value Date   CHOL 90 12/16/2015   HDL 40* 12/16/2015   LDLCALC 37 12/16/2015   TRIG 67 12/16/2015   Lab Results  Component Value Date   DDIMER 2.19* 11/23/2015     BNP (last 3 results)  Recent Labs  11/22/15 2052 12/16/15 0113  BNP 1732.2* 1426.5*    ProBNP (last 3 results) No results for input(s): PROBNP in the last 8760 hours.  Radiology Studies    Dg Chest 2 View: 01/13/2016  CLINICAL DATA:  Shortness of breath EXAM: CHEST  2 VIEW COMPARISON:  12/17/2015 chest radiograph. FINDINGS: Stable cardiomediastinal  silhouette with mild cardiomegaly. No pneumothorax. No pleural effusion. New mild hazy opacities in the bilateral upper lobes. IMPRESSION: Stable mild cardiomegaly. New mild hazy opacities in the bilateral upper lobes, which are nonspecific and could represent pneumonia or mild pulmonary edema. Recommend follow-up PA and lateral post treatment chest radiographs in 4-6 weeks. Electronically Signed   By: Delbert Phenix M.D.   On: 01/13/2016 17:46    EKG & Cardiac Imaging    EKG: Sinus tachycardia, HR 104 with LBBB  Echocardiogram: 11/24/2015 Study Conclusions - Left ventricle: The cavity size was mildly dilated. Wall  thickness was normal. Systolic function was severely reduced. The  estimated ejection fraction was in the range of 25% to 30%.  Akinesis of the inferior myocardium. Hypokinesis of the  anteroseptal and apical myocardium. - Aortic valve: There was mild regurgitation. - Mitral valve: Calcified annulus. There was moderate to severe  regurgitation directed posteriorly. - Left atrium: The atrium was mildly dilated. - Right ventricle: Systolic function was mildly reduced. - Right atrium: The atrium was mildly dilated. - Tricuspid valve: There was severe regurgitation directed  centrally. There was moderate-severe perivalvular regurgitation. - Pulmonary arteries: Systolic pressure was moderately increased.  PA peak pressure: 53 mm Hg (S).  Assessment & Plan    1. Acute on Chronic Combined Systolic and Diastolic CHF - reports worsening lower extremity edema for the past two weeks despite increasing of her outpatient Lasix dosing from 40mg  daily to 40mg  BID. Reports mild associated orthopnea.  - CXR on admission shows stable mild cardiomegaly with new mild hazy opacities in the bilateral upper lobes, which are nonspecific and could represent pneumonia or mild pulmonary edema.  - Unclear weight as it was recorded as 171 lbs, 172 lbs, and 177 lbs all on the day of admission. Weight  today is 175 lbs. When seen in the office on 4/11, her weight was 179 lbs.  - started on IV Lasix 60mg  Q8H with only a net recorded output of - , however the  patient reports having multiple incontinent episodes. She reports significant urine output along with improvement in her respiratory symptoms. Still has lower extremity edema. - continue to obtain daily weights and strict I&O's.  - has not been on a BB or ACE-I secondary to continued hypotension.   2. PAF This patients CHA2DS2-VASc Score and unadjusted Ischemic Stroke Rate (% per year) is equal to 7.2 % stroke rate/year from a score of 5 (CHF, HTN, DM, Age (2)). Continue Elqiuis for anticoagulation (renally adjusted dose).  - maintaining NSR thus far this admission.  3. Hypotension - BP has been 88/50 - 103/54 in the past 24 hours. - continue to monitor closely while receiving IV diuresis.   4. Stage 3 CKD - creatinine at 1.35 on 01/14/2016 (has ranged from 1.3 - 2.3 over the past few months) - continue to monitor while receiving IV diuresis  5. Moderate to Severe MR - continued follow-up as an outpatient.  Signed, Theresa Lennox, Theresa Cortez 01/14/2016, 12:31 PM Pager: [phone]  Patient seen and examined. Agree with assessment and plan.  Theresa Cortez is an 82 year old patient who is followed by Dr. Royann Shivers and had recently been hospitalized with new onset congestive heart failure.  She was found to have an ejection fraction of 25-30% without significant CAD at cardiac catheterization.  There was moderate to severe mitral insufficiency, tricuspid insufficiency, mild AR and there was moderate pulmonary hypertension with a PA pressure at 53 mm.  She has a history of recurrent paroxysmal atrial flutter and atrial fibrillation.  She has been taking amiodarone and low dose Eliquis therapy.  There is a history of renal insufficiency.  During her last admission, she had lost approximately 20 pounds.  For the past week, she has noticed  more shortness of breath.  [facility].  Last week her Lasix dose was reduced from 40 mg twice a day to 40 mg daily.  Since her admission she has been on 60 mg IV Lasix every 8 hours.  I suspect that her I/O's are accurate due to incontinence.  Laboratory reveals a creatinine of 1.35.  In the past, she has had difficulty with hypotension and is not on any ACE-I/ARB or spironolactone.  At present, her blood pressure is in the low 100s.  She is maintaining sinus rhythm without recurrent AF.  JVD is increased.  She had decreased breath sounds at her bases.  Rhythm was regular with a 2/6 systolic murmur.  There was central adiposity with positive bowel sounds..  There is no HJR.  She has 1+ lower extremity edema.  Her ECG revealed sinus tachycardia 104 with left bundle branch block.  At present, I would check a BNP.  With her history of having difficulty with low blood pressure.  It may be beneficial to initiate digoxin at 0.0625 mg.  I will continue her Lasix at 60 mg every hours today but tomorrow reduce this to twice a day and she may be a candidate for potential rechallenge of very low-dose lisinopril at 2.5 mg if blood pressure and renal function remained stable.  At present, I would increase Eliquis to 5 mg twice a day since she only has age and her creatinine is less than 1.5 and weight greater than 60 kg  for more optimal anticoagulation.   Lennette Bihari, MD, [facility] 01/14/2016 1:49 PM

## 2016-01-14 NOTE — NC FL2 (Signed)
[place] MEDICAID FL2 LEVEL OF CARE SCREENING TOOL     IDENTIFICATION  Patient Name: Theresa Cortez Birthdate: 1929/12/21 Sex: female Admission Date (Current Location): 01/13/2016  [facility] and [place] Number:  Producer, television/film/video and Address:  [facility],  [address] [place] [address]      Provider Number: [id number]  Attending Physician Name and Address:  Maretta Bees, MD  Relative Name and Phone Number:       Current Level of Care: Hospital Recommended Level of Care: Skilled Nursing Facility Prior Approval Number:    Date Approved/Denied:   PASRR Number: [id number]  Discharge Plan: SNF    Current Diagnoses: Patient Active Problem List   Diagnosis Date Noted  . Dyspnea 01/13/2016  . Acute on chronic systolic (congestive) heart failure (HCC) 01/13/2016  . Cough 01/13/2016  . Cardiorenal disease 01/13/2016  . Nonischemic cardiomyopathy (HCC) 12/21/2015  . Coronary artery disease 12/21/2015  . Chronic renal insufficiency, stage IV (severe) 12/21/2015  . PAF (paroxysmal atrial fibrillation) (HCC) 12/21/2015  . HCAP (healthcare-associated pneumonia) 12/21/2015  . Chronic anticoagulation 12/21/2015  . Nausea with vomiting 12/20/2015  . Influenza B 12/20/2015  . Fluid overload 12/20/2015  . Chronic combined systolic and diastolic congestive heart failure (HCC) 12/20/2015  . Adnexal cyst - left 12/16/2015  . Protein-calorie malnutrition, moderate (HCC) 12/16/2015  . Abdominal pain   . Lactic acidosis   . HLD (hyperlipidemia) 12/15/2015  . Hypothyroidism 12/15/2015  . RA (rheumatoid arthritis) (HCC) 12/15/2015  . Hyperkalemia 12/15/2015  . Hyponatremia 12/15/2015  . Chronic atrial fibrillation (HCC) 12/15/2015  . Hypotension 12/15/2015  . Arterial hypotension   . Pain of upper abdomen   . Diabetes mellitus, type 2 (HCC) 12/06/2015  . Acute renal failure superimposed on stage 3 chronic kidney disease (HCC) 12/06/2015  . Atrial  fibrillation with rapid ventricular response (HCC)   . ARF (acute renal failure) (HCC)   . Elevated LFTs   . Positive D dimer   . Mitral regurgitation   . Tricuspid regurgitation   . Pulmonary hypertension (HCC)   . Heart failure (HCC) 11/22/2015  . Acute congestive heart failure (HCC) 11/22/2015  . Hypertension 10/11/2012  . Cellulitis of knee, right 10/11/2012  . DM (diabetes mellitus) (HCC) 10/11/2012  . Dermatitis 10/11/2012    Orientation RESPIRATION BLADDER Height & Weight     Self, Time, Situation, Place  O2 (3L) Continent Weight: 175 lb 9.6 oz (79.652 kg) Height:  5\' 6"  (167.6 cm)  BEHAVIORAL SYMPTOMS/MOOD NEUROLOGICAL BOWEL NUTRITION STATUS      Continent Diet (Heart )  AMBULATORY STATUS COMMUNICATION OF NEEDS Skin   Extensive Assist Verbally Normal                       Personal Care Assistance Level of Assistance  Bathing, Dressing Bathing Assistance: Limited assistance   Dressing Assistance: Limited assistance     Functional Limitations Info  Sight Sight Info: Impaired        SPECIAL CARE FACTORS FREQUENCY  PT (By licensed PT), OT (By licensed OT)     PT Frequency: 5 OT Frequency: 5            Contractures      Additional Factors Info  Code Status, Allergies Code Status Info: Fullcode Allergies Info: Penicillins           Current Medications (01/14/2016):  This is the current hospital active medication list Current Facility-Administered Medications  Medication Dose Route Frequency  Provider Last Rate Last Dose  . 0.9 %  sodium chloride infusion  250 mL Intravenous PRN Delano Metz, MD      . acetaminophen (TYLENOL) tablet 650 mg  650 mg Oral Q6H PRN Delano Metz, MD       Or  . acetaminophen (TYLENOL) suppository 650 mg  650 mg Rectal Q6H PRN Delano Metz, MD      . antiseptic oral rinse (CPC / CETYLPYRIDINIUM CHLORIDE 0.05%) solution 7 mL  7 mL Mouth Rinse q12n4p Delano Metz, MD      . apixaban Everlene Balls) tablet 2.5 mg  2.5  mg Oral BID Delano Metz, MD   2.5 mg at 01/14/16 1012  . benzonatate (TESSALON) capsule 100 mg  100 mg Oral TID PRN Maretta Bees, MD   100 mg at 01/14/16 1012  . bisacodyl (DULCOLAX) suppository 10 mg  10 mg Rectal Daily PRN Delano Metz, MD      . chlorhexidine (PERIDEX) 0.12 % solution 15 mL  15 mL Mouth Rinse BID Delano Metz, MD   15 mL at 01/14/16 1013  . dextromethorphan (DELSYM) 30 MG/5ML liquid 30 mg  30 mg Oral BID PRN Delano Metz, MD   30 mg at 01/14/16 0316  . docusate sodium (COLACE) capsule 200 mg  200 mg Oral BID Delano Metz, MD   200 mg at 01/14/16 1012  . folic acid (FOLVITE) tablet 1 mg  1 mg Oral Daily Delano Metz, MD   1 mg at 01/14/16 1012  . furosemide (LASIX) injection 60 mg  60 mg Intravenous Q8H Delano Metz, MD   60 mg at 01/14/16 0641  . HYDROcodone-acetaminophen (NORCO/VICODIN) 5-325 MG per tablet 1-2 tablet  1-2 tablet Oral Q4H PRN Delano Metz, MD      . hydroxychloroquine (PLAQUENIL) tablet 200 mg  200 mg Oral BID Delano Metz, MD   200 mg at 01/14/16 1012  . latanoprost (XALATAN) 0.005 % ophthalmic solution 1 drop  1 drop Both Eyes QHS Delano Metz, MD   1 drop at 01/13/16 2252  . levothyroxine (SYNTHROID, LEVOTHROID) tablet 88 mcg  88 mcg Oral QAC breakfast Delano Metz, MD   88 mcg at 01/14/16 [phone]  . ondansetron (ZOFRAN) tablet 4 mg  4 mg Oral Q6H PRN Delano Metz, MD       Or  . ondansetron [facility]) injection 4 mg  4 mg Intravenous Q6H PRN Delano Metz, MD      . polyethylene glycol (MIRALAX / GLYCOLAX) packet 17 g  17 g Oral Daily Delano Metz, MD   17 g at 01/14/16 1000  . sodium chloride flush (NS) 0.9 % injection 3 mL  3 mL Intravenous Q12H Delano Metz, MD   3 mL at 01/14/16 1012  . sodium chloride flush (NS) 0.9 % injection 3 mL  3 mL Intravenous PRN Delano Metz, MD      . sodium chloride flush (NS) 0.9 % injection 3 mL  3 mL Intravenous Q12H Delano Metz, MD   3 mL at 01/14/16 1012     Discharge  Medications: Please see discharge summary for a list of discharge medications.  Relevant Imaging Results:  Relevant Lab Results:   Additional Information SS#: [id number]  Arlyss Repress, LCSW

## 2016-01-14 NOTE — Progress Notes (Signed)
Inpatient Diabetes Program Recommendations  AACE/ADA: New Consensus Statement on Inpatient Glycemic Control (2015)  Target Ranges:  Prepandial:   less than 140 mg/dL      Peak postprandial:   less than 180 mg/dL (1-2 hours)      Critically ill patients:  140 - 180 mg/dL   Admit with: SOB  History: DM, CHF, CKD  Home DM Meds: Novolog 0-11 units bid  Current Insulin Orders: None yet     MD- Please place orders for Novolog Sensitive Correction Scale/ SSI (0-9 units) TID AC + HS     --Will follow patient during hospitalization--  Ambrose Finland RN, MSN, CDE Diabetes Coordinator Inpatient Glycemic Control Team Team Pager: [phone] (8a-5p)

## 2016-01-14 NOTE — Progress Notes (Addendum)
PROGRESS NOTE        PATIENT DETAILS Name: Theresa Cortez Age: 82 y.o. Sex: female Date of Birth: 1929-11-06 Admit Date: 01/13/2016 Admitting Physician Delano Metz, MD DTO:IZTI Shona Simpson, MD Outpatient Specialists:Dr Croitoru  Brief Narrative: [name] with hx of HTN,DM, systolic CHF diagnosed March 2017 w LVEF 25-30% and heart cath negative for sig CAD presented with decompensated systolic CHF.  Subjective: Feels much better-less SOB-continues to have lower ext edema.   Assessment/Plan: Principal Problem: Acute on chronic systolic (congestive) heart failure (EF 25-30% by TTE on 11/24/15):improving but still with elevated JVD and lower ext edema, continue IV Lasix, follow weight/I&O's.Per chart review BP has been to soft in the past to tolerate Beta blockers, Hydralazine etc. Follow  Active Problems: Hypervolemic Hyponatremia:secondary to acute CHF, improving with Lasix. Follow  Lactic Acidosis:no evidence of infection-afebrile-non toxic appearing-UA/CXR negative. Suspect from low flow state from decompensated CHF.   Afib: in sinus rhythm-CHA2DS2-VASc Score is 5-continue Eliquis. She was on Amiodarone in the recent past-which was discontinued due to elevated LFT's  CKD stage 3:suspected cardio-renal syndrome-creatinine seems stable-follow closely while on IV Lasix. Has been followed by [place].  Hx of HTN:BP currently soft-no indications for anti-hypertensives.  DM-2:Last A1C 3/24 7.3-CBG's stable with   Severe Mitral Regurgitation:  likely secondary to cardiomyopathy-continue to optimize med treatment, and follow with cards as outpatient  Pul WPY:KDXIPJ secondary to CHF-could be contributing to some leg edema.   Hypothyroidism:continue Levothyroxine  Hx of Rheumatoid Arthritis:has been on MTX in the past-d/c'd due to elevated LFT's, continue Plaquenil. Appears stable at present.  Deconditioning/Debility:PT eval-was at  SNF-prior to this admission  DVT Prophylaxis: Eliquis  Code Status: Full code   Family Communication: Daughter at bedside  Disposition Plan: Remain inpatient  Antimicrobial agents: None  Procedures: None  CONSULTS:  cardiology  Time spent: 25 minutes-Greater than 50% of this time was spent in counseling, explanation of diagnosis, planning of further management, and coordination of care.  MEDICATIONS: Anti-infectives    Start     Dose/Rate Route Frequency Ordered Stop   01/14/16 1800  vancomycin (VANCOCIN) IVPB 1000 mg/200 mL premix  Status:  Discontinued     1,000 mg 200 mL/hr over 60 Minutes Intravenous Every 24 hours 01/13/16 1932 01/13/16 2156   01/13/16 2300  hydroxychloroquine (PLAQUENIL) tablet 200 mg     200 mg Oral 2 times daily 01/13/16 2156     01/13/16 1900  vancomycin (VANCOCIN) IVPB 1000 mg/200 mL premix     1,000 mg 200 mL/hr over 60 Minutes Intravenous  Once 01/13/16 1851 01/13/16 2136      Scheduled Meds: . antiseptic oral rinse  7 mL Mouth Rinse q12n4p  . apixaban  2.5 mg Oral BID  . chlorhexidine  15 mL Mouth Rinse BID  . docusate sodium  200 mg Oral BID  . folic acid  1 mg Oral Daily  . furosemide  60 mg Intravenous Q8H  . hydroxychloroquine  200 mg Oral BID  . latanoprost  1 drop Both Eyes QHS  . levothyroxine  88 mcg Oral QAC breakfast  . polyethylene glycol  17 g Oral Daily  . sodium chloride flush  3 mL Intravenous Q12H  . sodium chloride flush  3 mL Intravenous Q12H   Continuous Infusions:  PRN Meds:.sodium chloride, acetaminophen **OR** acetaminophen, benzonatate, bisacodyl, dextromethorphan, HYDROcodone-acetaminophen,  ondansetron **OR** ondansetron (ZOFRAN) IV, sodium chloride flush   PHYSICAL EXAM: Vital signs: Filed Vitals:   01/13/16 1835 01/13/16 1901 01/13/16 2200 01/14/16 0450  BP:  103/79 95/59 103/64  Pulse:  102 93 90  Temp:   97.5 F (36.4 C) 97.6 F (36.4 C)  TempSrc:   Oral Oral  Resp:  32 30 22  Height: 5\' 6"   (1.676 m)  5\' 6"  (1.676 m)   Weight: 78.019 kg (172 lb)  80.377 kg (177 lb 3.2 oz) 79.652 kg (175 lb 9.6 oz)  SpO2:  98% 100% 100%   Filed Weights   01/13/16 1835 01/13/16 2200 01/14/16 0450  Weight: 78.019 kg (172 lb) 80.377 kg (177 lb 3.2 oz) 79.652 kg (175 lb 9.6 oz)   Body mass index is 28.36 kg/(m^2).   Gen Exam: Awake and alert with clear speech. Not in any distress  Neck: Supple,+ JVD.   Chest: B/L Clear-except a few bibasilar rales CVS: S1 S2 Regular, + syst murmur  Abdomen: soft, BS +, non tender, non distended.  Extremities: ++ edema, lower extremities warm to touch. Neurologic: Non Focal.   Skin: No Rash or lesions   Wounds: N/A.   LABORATORY DATA: CBC:  Recent Labs Lab 01/13/16 1724  WBC 8.7  NEUTROABS 6.2  HGB 9.2*  HCT 27.9*  MCV 96.2  PLT 387    Basic Metabolic Panel:  Recent Labs Lab 01/13/16 1724 01/14/16 0508  NA 128* 133*  K 4.0 4.8  CL 90* 96*  CO2 25 25  GLUCOSE 181* 129*  BUN 19 19  CREATININE 1.38* 1.35*  CALCIUM 9.0 8.7*    GFR: Estimated Creatinine Clearance: 33.6 mL/min (by C-G formula based on Cr of 1.35).  Liver Function Tests:  Recent Labs Lab 01/13/16 1724  AST 37  ALT 34  ALKPHOS 131*  BILITOT 1.0  PROT 8.1  ALBUMIN 2.9*   No results for input(s): LIPASE, AMYLASE in the last 168 hours. No results for input(s): AMMONIA in the last 168 hours.  Coagulation Profile: No results for input(s): INR, PROTIME in the last 168 hours.  Cardiac Enzymes: No results for input(s): CKTOTAL, CKMB, CKMBINDEX, TROPONINI in the last 168 hours.  BNP (last 3 results) No results for input(s): PROBNP in the last 8760 hours.  HbA1C: No results for input(s): HGBA1C in the last 72 hours.  CBG: No results for input(s): GLUCAP in the last 168 hours.  Lipid Profile: No results for input(s): CHOL, HDL, LDLCALC, TRIG, CHOLHDL, LDLDIRECT in the last 72 hours.  Thyroid Function Tests: No results for input(s): TSH, T4TOTAL, FREET4,  T3FREE, THYROIDAB in the last 72 hours.  Anemia Panel: No results for input(s): VITAMINB12, FOLATE, FERRITIN, TIBC, IRON, RETICCTPCT in the last 72 hours.  Urine analysis:    Component Value Date/Time   COLORURINE YELLOW 01/14/2016 0502   APPEARANCEUR CLEAR 01/14/2016 0502   LABSPEC 1.009 01/14/2016 0502   PHURINE 6.0 01/14/2016 0502   GLUCOSEU NEGATIVE 01/14/2016 0502   HGBUR NEGATIVE 01/14/2016 0502   BILIRUBINUR NEGATIVE 01/14/2016 0502   KETONESUR NEGATIVE 01/14/2016 0502   PROTEINUR NEGATIVE 01/14/2016 0502   UROBILINOGEN 0.2 01/27/2011 1919   NITRITE NEGATIVE 01/14/2016 0502   LEUKOCYTESUR NEGATIVE 01/14/2016 0502    Sepsis Labs: Lactic Acid, Venous    Component Value Date/Time   LATICACIDVEN 5.71* 01/13/2016 2020    MICROBIOLOGY: No results found for this or any previous visit (from the past 240 hour(s)).  RADIOLOGY STUDIES/RESULTS: Ct Abdomen Pelvis Wo Contrast  12/16/2015  CLINICAL DATA:  Right mid abdominal pain for 3 days. Nausea and vomiting. EXAM: CT ABDOMEN AND PELVIS WITHOUT CONTRAST TECHNIQUE: Multidetector CT imaging of the abdomen and pelvis was performed following the standard protocol without IV contrast. COMPARISON:  CT 4 days prior 12/12/2015 FINDINGS: Lower chest: Stable cardiomegaly. Bibasilar bronchiectasis, bibasilar opacities, and small pleural effusions, unchanged from prior exam. Liver: No focal lesion allowing for lack contrast. Hepatobiliary: High-density material within the gallbladder, likely combination of sludge and stones. Question of gallbladder wall thickening and pericholecystic soft tissue stranding. No evidence of biliary dilatation. Pancreas: No ductal dilatation or inflammation. Suboptimal pancreatic assessment given lack contrast. Spleen: Normal. Adrenal glands: Bilateral thickening, no nodule. Kidneys: Bilateral perinephric soft tissue stranding, new from prior. No hydronephrosis. Stomach/Bowel: No abnormal gastric distention, small hiatal  hernia. Limited bowel assessment given lack contrast. There are no dilated or thickened small bowel loops. Enteric chain sutures noted in the left abdomen. No evidence of bowel obstruction. Moderate stool burden. The appendix is not visualized. Vascular/Lymphatic: No retroperitoneal adenopathy. Abdominal aorta is normal in caliber. Moderate atherosclerosis of the abdominal aorta without aneurysm. Reproductive: Left ovarian/adnexal cyst measures 2.6 cm, unchanged from recent prior. Uterus remains in situ. Right ovary is not well seen. Bladder: Physiologically distended. Other: Development of small volume free fluid in both pericolic gutters and in the pelvis. Mild mesenteric edema. No free air. Musculoskeletal: There are no acute or suspicious osseous abnormalities. Degenerative change in the lumbar spine. IMPRESSION: 1. High-density material in the gallbladder, likely combination of small stones and sludge. Questionable gallbladder wall thickening and pericholecystic soft tissue stranding. Given right-sided abdominal pain, acute cholecystitis is considered. Further evaluation with right upper quadrant ultrasound recommended. 2. Development of small volume free fluid in the abdomen and pelvis. Mesenteric and perinephric edema. Findings may be related to hydration status. Correlation with urinalysis recommended to exclude urinary tract infection. 3. Left adnexal cyst measures 2.6 cm, this is unchanged from recent prior, however abnormal in a postmenopausal patient of this age. Nonemergent sonographic follow-up is recommended. 4. Additional chronic findings are stable. Electronically Signed   By: Rubye Oaks M.D.   On: 12/16/2015 04:15   Dg Chest 2 View  01/13/2016  CLINICAL DATA:  Shortness of breath EXAM: CHEST  2 VIEW COMPARISON:  12/17/2015 chest radiograph. FINDINGS: Stable cardiomediastinal silhouette with mild cardiomegaly. No pneumothorax. No pleural effusion. New mild hazy opacities in the bilateral  upper lobes. IMPRESSION: Stable mild cardiomegaly. New mild hazy opacities in the bilateral upper lobes, which are nonspecific and could represent pneumonia or mild pulmonary edema. Recommend follow-up PA and lateral post treatment chest radiographs in 4-6 weeks. Electronically Signed   By: Delbert Phenix M.D.   On: 01/13/2016 17:46   Dg Chest 2 View  12/15/2015  CLINICAL DATA:  Weakness and shortness of Breath EXAM: CHEST  2 VIEW COMPARISON:  12/12/2015 FINDINGS: Cardiac shadow remains enlarged. The lungs are again well aerated. Some increasing density is noted in the right upper lobe which may represent some early infiltrate. Chronic interstitial changes are noted. No sizable effusion is seen. No acute bony abnormality is noted. IMPRESSION: Increased density in the right upper lobe which may represent acute on chronic infiltrate. Electronically Signed   By: Alcide Clever M.D.   On: 12/15/2015 17:08   Nm Hepatobiliary Including Gb  12/16/2015  CLINICAL DATA:  Right upper quadrant abdominal pain for 1 month. EXAM: NUCLEAR MEDICINE HEPATOBILIARY IMAGING TECHNIQUE: Sequential images of the abdomen were obtained out to 60 minutes  following intravenous administration of radiopharmaceutical. RADIOPHARMACEUTICALS:  5.2 mCi Tc-77m  Choletec IV COMPARISON:  CT scan of December 12, 2015. FINDINGS: Prompt uptake and biliary excretion of activity by the liver is seen. Gallbladder activity is visualized, consistent with patency of cystic duct. Biliary activity passes into small bowel, consistent with patent common bile duct. IMPRESSION: Normal uptake is seen within the gallbladder.  No abnormality seen. Electronically Signed   By: Lupita Raider, M.D.   On: 12/16/2015 13:09   Dg Chest Port 1 View  12/17/2015  CLINICAL DATA:  Pulmonary infiltrate. EXAM: PORTABLE CHEST 1 VIEW COMPARISON:  12/15/2015. FINDINGS: Mediastinum and hilar structures normal. Cardiomegaly with normal pulmonary vascularity. Interim clearing of right upper  lobe infiltrate. Mild basilar atelectasis. Small pleural effusions cannot be excluded . IMPRESSION: 1. Interim clearing of right upper lobe infiltrate. Persistent mild basilar atelectasis and small bilateral pleural effusions. 2.  Stable cardiomegaly. Electronically Signed   By: Maisie Fus  Register   On: 12/17/2015 07:16   US Abdomen Limited Ruq  12/16/2015  CLINICAL DATA:  Abdominal pain. EXAM: US ABDOMEN LIMITED - RIGHT UPPER QUADRANT COMPARISON:  CT 12/16/2015 . FINDINGS: Gallbladder: No gallstones. Gallbladder wall is thickened up to 3.8 mm. Pericholecystic fluid noted. Negative Murphy sign. Common bile duct: Diameter: 1.7 mm Liver: No focal lesion identified. Within normal limits in parenchymal echogenicity. Small right pleural effusion. IMPRESSION: 1. No gallstones noted. 2. Gallbladder wall thickening up to 3.8 mm noted. This can be seen with cholecystitis and or hypoproteinemia. Pericholecystic fluid noted. Negative Murphy sign. 3.  Small right pleural effusion. Electronically Signed   By: Maisie Fus  Register   On: 12/16/2015 08:20     LOS: 1 day   Jeoffrey Massed, MD  [facility] Pager:[phone]  If 7PM-7AM, please contact night-coverage [email or web address] Password [email or web address] 01/14/2016, 10:27 AM

## 2016-01-15 LAB — BASIC METABOLIC PANEL
Anion gap: 12 (ref 5–15)
BUN: 26 mg/dL — AB (ref 6–20)
CHLORIDE: 95 mmol/L — AB (ref 101–111)
CO2: 23 mmol/L (ref 22–32)
CREATININE: 1.67 mg/dL — AB (ref 0.44–1.00)
Calcium: 8.9 mg/dL (ref 8.9–10.3)
GFR calc Af Amer: 32 mL/min — ABNORMAL LOW (ref >60)
GFR calc non Af Amer: 27 mL/min — ABNORMAL LOW (ref >60)
GLUCOSE: 92 mg/dL (ref 65–99)
POTASSIUM: 4.4 mmol/L (ref 3.5–5.1)
Sodium: 130 mmol/L — ABNORMAL LOW (ref 135–145)

## 2016-01-15 LAB — GLUCOSE, CAPILLARY
GLUCOSE-CAPILLARY: 110 mg/dL — AB (ref 65–99)
Glucose-Capillary: 74 mg/dL (ref 65–99)
Glucose-Capillary: 169 mg/dL — ABNORMAL HIGH (ref 65–99)
Glucose-Capillary: 88 mg/dL (ref 65–99)

## 2016-01-15 LAB — EXPECTORATED SPUTUM ASSESSMENT W REFEX TO RESP CULTURE

## 2016-01-15 LAB — MRSA PCR SCREENING: MRSA BY PCR: NEGATIVE

## 2016-01-15 LAB — BRAIN NATRIURETIC PEPTIDE: B Natriuretic Peptide: 1386.8 pg/mL — ABNORMAL HIGH (ref 0.0–100.0)

## 2016-01-15 LAB — EXPECTORATED SPUTUM ASSESSMENT W GRAM STAIN, RFLX TO RESP C

## 2016-01-15 MED ORDER — DOXYCYCLINE HYCLATE 100 MG PO TABS
100.0000 mg | ORAL_TABLET | Freq: Two times a day (BID) | ORAL | Status: DC
  Administered 2016-01-15 – 2016-01-18 (×7): 100 mg via ORAL
  Filled 2016-01-15 (×8): qty 1

## 2016-01-15 NOTE — Evaluation (Signed)
Physical Therapy Evaluation Patient Details Name: KATHRENE SINOPOLI MRN: [id number] DOB: 07/20/1930 Today's Date: 01/15/2016   History of Present Illness  82 yo female admitted from SNF with acute on chronic HF. Hx of HTN, DM, CHF, CVA, CKD, A fib, RA, OA.   Clinical Impression  On eval, pt required Min assist for mobility-walked ~50 feet with RW. O2 sats 97% on RA. Pt fatigues easily. Plan is to return to SNF for continued rehab.     Follow Up Recommendations SNF    Equipment Recommendations  None recommended by PT    Recommendations for Other Services       Precautions / Restrictions Precautions Precautions: Fall Restrictions Weight Bearing Restrictions: No      Mobility  Bed Mobility               General bed mobility comments: oob in recliner  Transfers Overall transfer level: Needs assistance Equipment used: Rolling walker (2 wheeled) Transfers: Sit to/from Stand Sit to Stand: Min assist         General transfer comment: Assist to rise, stabilize, control descent. VCs safety, technique, hand/LE placement. LOB x1 posteriorly on 1st standing attempt (back into recliner)  Ambulation/Gait Ambulation/Gait assistance: Min assist Ambulation Distance (Feet): 50 Feet Assistive device: Rolling walker (2 wheeled) Gait Pattern/deviations: Step-through pattern;Decreased stride length     General Gait Details: Assist to stabilize pt. 2 brief standing rest breaks. O2 sats 97% on RA.   Stairs            Wheelchair Mobility    Modified Rankin (Stroke Patients Only)       Balance Overall balance assessment: Needs assistance         Standing balance support: Bilateral upper extremity supported;During functional activity Standing balance-Leahy Scale: Poor                               Pertinent Vitals/Pain Pain Assessment: No/denies pain    Home Living Family/patient expects to be discharged to:: Skilled nursing facility                  Additional Comments: pt was at [facility] following last admission    Prior Function Level of Independence: Needs assistance   Gait / Transfers Assistance Needed: ambulates with RW  ADL's / Homemaking Assistance Needed: dtr has been helping her since summer 2016        Hand Dominance        Extremity/Trunk Assessment   Upper Extremity Assessment: Generalized weakness           Lower Extremity Assessment: Generalized weakness      Cervical / Trunk Assessment: Normal  Communication   Communication: No difficulties  Cognition Arousal/Alertness: Awake/alert Behavior During Therapy: WFL for tasks assessed/performed Overall Cognitive Status: Within Functional Limits for tasks assessed                      General Comments      Exercises  Sit to stand x 3 for practice, strengthening      Assessment/Plan    PT Assessment Patient needs continued PT services  PT Diagnosis Difficulty walking;Generalized weakness   PT Problem List Decreased strength;Decreased activity tolerance;Decreased balance;Decreased mobility;Decreased knowledge of use of DME  PT Treatment Interventions DME instruction;Gait training;Functional mobility training;Therapeutic activities;Patient/family education;Balance training;Therapeutic exercise   PT Goals (Current goals can be found in the Care Plan section) Acute  Rehab PT Goals Patient Stated Goal: back to rehab PT Goal Formulation: With patient/family Time For Goal Achievement: 01/29/16 Potential to Achieve Goals: Good    Frequency Min 3X/week   Barriers to discharge        Co-evaluation               End of Session Equipment Utilized During Treatment: Gait belt Activity Tolerance: Patient tolerated treatment well Patient left: in chair;with call bell/phone within reach;with family/visitor present           Time: 1021-1035 PT Time Calculation (min) (ACUTE ONLY): 14 min   Charges:   PT  Evaluation $PT Eval Low Complexity: 1 Procedure     PT G Codes:        Rebeca Alert, MPT Pager: [phone]

## 2016-01-15 NOTE — Progress Notes (Signed)
Patient ID: Theresa Cortez, female   DOB: 02/22/1930, 82 y.o.   MRN: [id number]     Subjective:    SOB improving but not back to baseline.   Objective:   Temp:  [97.4 F (36.3 C)-98 F (36.7 C)] 98 F (36.7 C) (05/06 0500) Pulse Rate:  [84-87] 86 (05/06 0500) Resp:  [18-20] 18 (05/06 0500) BP: (98-107)/(57-66) 105/60 mmHg (05/06 0500) SpO2:  [100 %] 100 % (05/06 0500) Weight:  [173 lb 4.8 oz (78.608 kg)] 173 lb 4.8 oz (78.608 kg) (05/06 0500) Last BM Date: 01/13/16  Filed Weights   01/13/16 2200 01/14/16 0450 01/15/16 0500  Weight: 177 lb 3.2 oz (80.377 kg) 175 lb 9.6 oz (79.652 kg) 173 lb 4.8 oz (78.608 kg)    Intake/Output Summary (Last 24 hours) at 01/15/16 0806 Last data filed at 01/15/16 0700  Gross per 24 hour  Intake    480 ml  Output    775 ml  Net   -295 ml    Telemetry: NSR  Exam:  General: NAD  HEENT: sclera clear, throat clear  Resp: CTAB  Cardiac: RRR, 3/6 systolic murmur at apex, elevated JVD  GI: abdomen soft, NT, ND  MSK: 1+ bilateral LE edema  Neuro: no focal deficits  Psych: appropriate affect  Lab Results:  Basic Metabolic Panel:  Recent Labs Lab 01/13/16 1724 01/14/16 0508 01/15/16 0523  NA 128* 133* 130*  K 4.0 4.8 4.4  CL 90* 96* 95*  CO2 25 25 23   GLUCOSE 181* 129* 92  BUN 19 19 26*  CREATININE 1.38* 1.35* 1.67*  CALCIUM 9.0 8.7* 8.9    Liver Function Tests:  Recent Labs Lab 01/13/16 1724  AST 37  ALT 34  ALKPHOS 131*  BILITOT 1.0  PROT 8.1  ALBUMIN 2.9*    CBC:  Recent Labs Lab 01/13/16 1724  WBC 8.7  HGB 9.2*  HCT 27.9*  MCV 96.2  PLT 387    Cardiac Enzymes: No results for input(s): CKTOTAL, CKMB, CKMBINDEX, TROPONINI in the last 168 hours.  BNP: No results for input(s): PROBNP in the last 8760 hours.  Coagulation: No results for input(s): INR in the last 168 hours.  ECG:   Medications:   Scheduled Medications: . antiseptic oral rinse  7 mL Mouth Rinse q12n4p  . apixaban  5 mg Oral  BID  . chlorhexidine  15 mL Mouth Rinse BID  . digoxin  0.0625 mg Oral Daily  . docusate sodium  200 mg Oral BID  . folic acid  1 mg Oral Daily  . furosemide  60 mg Intravenous Q8H  . hydroxychloroquine  200 mg Oral BID  . insulin aspart  0-9 Units Subcutaneous TID WC  . latanoprost  1 drop Both Eyes QHS  . levothyroxine  88 mcg Oral QAC breakfast  . polyethylene glycol  17 g Oral Daily  . sodium chloride flush  3 mL Intravenous Q12H  . sodium chloride flush  3 mL Intravenous Q12H     Infusions:     PRN Medications:  sodium chloride, acetaminophen **OR** acetaminophen, benzonatate, bisacodyl, dextromethorphan, HYDROcodone-acetaminophen, ondansetron **OR** ondansetron (ZOFRAN) IV, sodium chloride flush     Assessment/Plan    1. Acute on chronic combined systolic/diastolic HF - 11/2015 echo LVEF 25-30%, mod to severe MR, mild RV dysfunction, severe TR - has not been on a BB or ACE-I secondary to continued hypotension.  - she is on lasix 60mg  tid, negative 295 mL per report. I/Os limited by incontinent episodes.  Weights down to 173 lbs today. Uptrend in Cr and BUN today, we will d/c IV lasix. Start oral 40mg  bid, still with some evidence of volume overload, she likely always has JVD due to her severe TR how she also has some remaining LE edema. She already received a dose of IV lasix 60mg  this AM, we will start oral diuretic tomorrow. Possible discharge tomorrow  2. PAF - This patients CHA2DS2-VASc Score and unadjusted Ischemic Stroke Rate (% per year) is equal to 7.2 % stroke rate/year from a score of 5 (CHF, HTN, DM, Age (2)). Continue Elqiuis for anticoagulation (renally adjusted dose).  - maintaining NSR thus far this admission. Has not tolerated beta blockers due to hypotension, started on low dose digoxin recently and tolerating.   3. Stage 3 CKD - creatinine at 1.35 on 01/14/2016 (has ranged from 1.3 - 2.3 over the past few months) - continue to monitor while receiving IV  diuresis  4. Mitral regurgitation - moderate to severe by echo  , M.D.

## 2016-01-15 NOTE — Progress Notes (Signed)
PROGRESS NOTE        PATIENT DETAILS Name: Theresa Cortez Age: 82 y.o. Sex: female Date of Birth: 03/20/1930 Admit Date: 01/13/2016 Admitting Physician Delano Metz, MD UYQ:IHKV Shona Simpson, MD Outpatient Specialists:Dr Croitoru  Brief Narrative: [name] with hx of HTN,DM, systolic CHF diagnosed March 2017 w LVEF 25-30% and heart cath negative for sig CAD presented with decompensated systolic CHF.  Subjective: Feels much better-less SOB-continues to have lower ext edema. Intermittent productive cough, denies chest pain, daughter in room  Assessment/Plan: Principal Problem: Acute on chronic systolic (congestive) heart failure (EF 25-30% by TTE on 11/24/15):improving but still with elevated JVD and lower ext edema, received IV Lasix, weight down, less edema, Per chart review BP has been too soft in the past to tolerate Beta blockers or acei, Hydralazine etc.  Cardiology input appreciated, plan to change to oral diuretics on 5/7  Active Problems: Hypervolemic Hyponatremia:secondary to acute CHF, fluctuating, Follow  Lactic Acidosis:no evidence of infection-afebrile-non toxic appearing-UA/CXR negative. Suspect from low flow state from decompensated CHF.  Patient with productive cough, from chf vs bronchitis? With h/o diabetes will start Trial of doxycycline, monitor  Afib: in sinus rhythm-CHA2DS2-VASc Score is 5-continue Eliquis. She was on Amiodarone in the recent past-which was discontinued due to elevated LFT's, she is on dig currently, cardiology following  CKD stage 3:suspected cardio-renal syndrome-creatinine seems stable-follow closely while on IV Lasix. Has been followed by [place].  Hx of HTN:BP currently soft-no indications for anti-hypertensives.  Diet controlled DM-2:Last A1C 3/24 7.3-CBG's stable with   Severe Mitral Regurgitation:  likely secondary to cardiomyopathy-continue to optimize med treatment, and follow with cards  as outpatient  Pul QQV:ZDGLOV secondary to CHF-could be contributing to some leg edema.   Hypothyroidism:continue Levothyroxine  Hx of Rheumatoid Arthritis:has been on MTX in the past-d/c'd due to elevated LFT's, continue Plaquenil. Appears stable at present.  Deconditioning/Debility:PT eval-was at SNF-prior to this admission  DVT Prophylaxis: Eliquis  Code Status: Full code   Family Communication: Daughter at bedside  Disposition Plan: SNF in 1-2 days  Antimicrobial agents: None  Procedures: None  CONSULTS:  cardiology  Time spent: 25 minutes-Greater than 50% of this time was spent in counseling, explanation of diagnosis, planning of further management, and coordination of care.  MEDICATIONS: Anti-infectives    Start     Dose/Rate Route Frequency Ordered Stop   01/15/16 1430  doxycycline (VIBRA-TABS) tablet 100 mg     100 mg Oral Every 12 hours 01/15/16 1418     01/14/16 1800  vancomycin (VANCOCIN) IVPB 1000 mg/200 mL premix  Status:  Discontinued     1,000 mg 200 mL/hr over 60 Minutes Intravenous Every 24 hours 01/13/16 1932 01/13/16 2156   01/13/16 2300  hydroxychloroquine (PLAQUENIL) tablet 200 mg     200 mg Oral 2 times daily 01/13/16 2156     01/13/16 1900  vancomycin (VANCOCIN) IVPB 1000 mg/200 mL premix     1,000 mg 200 mL/hr over 60 Minutes Intravenous  Once 01/13/16 1851 01/13/16 2136      Scheduled Meds: . antiseptic oral rinse  7 mL Mouth Rinse q12n4p  . apixaban  5 mg Oral BID  . chlorhexidine  15 mL Mouth Rinse BID  . digoxin  0.0625 mg Oral Daily  . docusate sodium  200 mg Oral BID  . doxycycline  100  mg Oral Q12H  . folic acid  1 mg Oral Daily  . hydroxychloroquine  200 mg Oral BID  . insulin aspart  0-9 Units Subcutaneous TID WC  . latanoprost  1 drop Both Eyes QHS  . levothyroxine  88 mcg Oral QAC breakfast  . polyethylene glycol  17 g Oral Daily  . sodium chloride flush  3 mL Intravenous Q12H  . sodium chloride flush  3 mL Intravenous  Q12H   Continuous Infusions:  PRN Meds:.sodium chloride, acetaminophen **OR** acetaminophen, benzonatate, bisacodyl, dextromethorphan, HYDROcodone-acetaminophen, ondansetron **OR** ondansetron (ZOFRAN) IV, sodium chloride flush   PHYSICAL EXAM: Vital signs: Filed Vitals:   01/14/16 1326 01/14/16 2105 01/15/16 0500 01/15/16 1033  BP: 98/57 107/66 105/60   Pulse: 84 87 86   Temp: 97.4 F (36.3 C) 97.5 F (36.4 C) 98 F (36.7 C)   TempSrc: Oral Oral Oral   Resp: 20 20 18    Height:      Weight:   78.608 kg (173 lb 4.8 oz)   SpO2: 100% 100% 100% 97%   Filed Weights   01/13/16 2200 01/14/16 0450 01/15/16 0500  Weight: 80.377 kg (177 lb 3.2 oz) 79.652 kg (175 lb 9.6 oz) 78.608 kg (173 lb 4.8 oz)   Body mass index is 27.98 kg/(m^2).   Gen Exam: Awake and alert with clear speech. Not in any distress  Neck: Supple,+ JVD.   Chest: B/L Clear-except a few bibasilar rales CVS: S1 S2 Regular, + syst murmur  Abdomen: soft, BS +, non tender, non distended.  Extremities: subsiding edema, lower extremities warm to touch. Neurologic: Non Focal.   Skin: No Rash or lesions   Wounds: N/A.   LABORATORY DATA: CBC:  Recent Labs Lab 01/13/16 1724  WBC 8.7  NEUTROABS 6.2  HGB 9.2*  HCT 27.9*  MCV 96.2  PLT 387    Basic Metabolic Panel:  Recent Labs Lab 01/13/16 1724 01/14/16 0508 01/15/16 0523  NA 128* 133* 130*  K 4.0 4.8 4.4  CL 90* 96* 95*  CO2 25 25 23   GLUCOSE 181* 129* 92  BUN 19 19 26*  CREATININE 1.38* 1.35* 1.67*  CALCIUM 9.0 8.7* 8.9    GFR: Estimated Creatinine Clearance: 27 mL/min (by C-G formula based on Cr of 1.67).  Liver Function Tests:  Recent Labs Lab 01/13/16 1724  AST 37  ALT 34  ALKPHOS 131*  BILITOT 1.0  PROT 8.1  ALBUMIN 2.9*   No results for input(s): LIPASE, AMYLASE in the last 168 hours. No results for input(s): AMMONIA in the last 168 hours.  Coagulation Profile: No results for input(s): INR, PROTIME in the last 168  hours.  Cardiac Enzymes: No results for input(s): CKTOTAL, CKMB, CKMBINDEX, TROPONINI in the last 168 hours.  BNP (last 3 results) No results for input(s): PROBNP in the last 8760 hours.  HbA1C: No results for input(s): HGBA1C in the last 72 hours.  CBG:  Recent Labs Lab 01/14/16 1152 01/14/16 1613 01/14/16 2110 01/15/16 0748 01/15/16 1058  GLUCAP 140* 94 76 74 169*    Lipid Profile: No results for input(s): CHOL, HDL, LDLCALC, TRIG, CHOLHDL, LDLDIRECT in the last 72 hours.  Thyroid Function Tests: No results for input(s): TSH, T4TOTAL, FREET4, T3FREE, THYROIDAB in the last 72 hours.  Anemia Panel: No results for input(s): VITAMINB12, FOLATE, FERRITIN, TIBC, IRON, RETICCTPCT in the last 72 hours.  Urine analysis:    Component Value Date/Time   COLORURINE YELLOW 01/14/2016 0502   APPEARANCEUR CLEAR 01/14/2016 0502  LABSPEC 1.009 01/14/2016 0502   PHURINE 6.0 01/14/2016 0502   GLUCOSEU NEGATIVE 01/14/2016 0502   HGBUR NEGATIVE 01/14/2016 0502   BILIRUBINUR NEGATIVE 01/14/2016 0502   KETONESUR NEGATIVE 01/14/2016 0502   PROTEINUR NEGATIVE 01/14/2016 0502   UROBILINOGEN 0.2 01/27/2011 1919   NITRITE NEGATIVE 01/14/2016 0502   LEUKOCYTESUR NEGATIVE 01/14/2016 0502    Sepsis Labs: Lactic Acid, Venous    Component Value Date/Time   LATICACIDVEN 5.71* 01/13/2016 2020    MICROBIOLOGY: No results found for this or any previous visit (from the past 240 hour(s)).  RADIOLOGY STUDIES/RESULTS: Dg Chest 2 View  01/13/2016  CLINICAL DATA:  Shortness of breath EXAM: CHEST  2 VIEW COMPARISON:  12/17/2015 chest radiograph. FINDINGS: Stable cardiomediastinal silhouette with mild cardiomegaly. No pneumothorax. No pleural effusion. New mild hazy opacities in the bilateral upper lobes. IMPRESSION: Stable mild cardiomegaly. New mild hazy opacities in the bilateral upper lobes, which are nonspecific and could represent pneumonia or mild pulmonary edema. Recommend follow-up PA and  lateral post treatment chest radiographs in 4-6 weeks. Electronically Signed   By: Delbert Phenix M.D.   On: 01/13/2016 17:46   Dg Chest Port 1 View  12/17/2015  CLINICAL DATA:  Pulmonary infiltrate. EXAM: PORTABLE CHEST 1 VIEW COMPARISON:  12/15/2015. FINDINGS: Mediastinum and hilar structures normal. Cardiomegaly with normal pulmonary vascularity. Interim clearing of right upper lobe infiltrate. Mild basilar atelectasis. Small pleural effusions cannot be excluded . IMPRESSION: 1. Interim clearing of right upper lobe infiltrate. Persistent mild basilar atelectasis and small bilateral pleural effusions. 2.  Stable cardiomegaly. Electronically Signed   By: Maisie Fus  Register   On: 12/17/2015 07:16     LOS: 2 days   Johany Hansman, MD PhD  [facility] Pager:[phone]  If 7PM-7AM, please contact night-coverage [email or web address] Password [email or web address] 01/15/2016, 2:18 PM

## 2016-01-16 LAB — BASIC METABOLIC PANEL
Anion gap: 14 (ref 5–15)
BUN: 30 mg/dL — ABNORMAL HIGH (ref 6–20)
CALCIUM: 9.1 mg/dL (ref 8.9–10.3)
CO2: 23 mmol/L (ref 22–32)
Chloride: 94 mmol/L — ABNORMAL LOW (ref 101–111)
Creatinine, Ser: 1.94 mg/dL — ABNORMAL HIGH (ref 0.44–1.00)
GFR, EST AFRICAN AMERICAN: 26 mL/min — AB (ref >60)
GFR, EST NON AFRICAN AMERICAN: 23 mL/min — AB (ref >60)
GLUCOSE: 93 mg/dL (ref 65–99)
POTASSIUM: 4.9 mmol/L (ref 3.5–5.1)
SODIUM: 131 mmol/L — AB (ref 135–145)

## 2016-01-16 LAB — GLUCOSE, CAPILLARY
GLUCOSE-CAPILLARY: 79 mg/dL (ref 65–99)
Glucose-Capillary: 158 mg/dL — ABNORMAL HIGH (ref 65–99)
Glucose-Capillary: 110 mg/dL — ABNORMAL HIGH (ref 65–99)
Glucose-Capillary: 137 mg/dL — ABNORMAL HIGH (ref 65–99)

## 2016-01-16 LAB — MAGNESIUM: MAGNESIUM: 1.9 mg/dL (ref 1.7–2.4)

## 2016-01-16 MED ORDER — FAMOTIDINE 20 MG PO TABS
20.0000 mg | ORAL_TABLET | Freq: Every day | ORAL | Status: DC
  Administered 2016-01-16 – 2016-01-17 (×2): 20 mg via ORAL
  Filled 2016-01-16 (×3): qty 1

## 2016-01-16 MED ORDER — POLYETHYLENE GLYCOL 3350 17 G PO PACK: 17.0000 g | PACK | Freq: Every day | ORAL | Status: DC | PRN

## 2016-01-16 NOTE — Progress Notes (Signed)
PROGRESS NOTE        PATIENT DETAILS Name: Theresa Cortez Age: 82 y.o. Sex: female Date of Birth: 1930-02-27 Admit Date: 01/13/2016 Admitting Physician Delano Metz, MD YNW:GNFA Shona Simpson, MD Outpatient Specialists:Dr Croitoru  Brief Narrative: [name] with hx of HTN,DM, systolic CHF diagnosed March 2017 w LVEF 25-30% and heart cath negative for sig CAD presented with decompensated systolic CHF.  Subjective: Feels much better-less SOB-continues to have lower ext edema. Intermittent productive cough, denies chest pain, daughter in room  Assessment/Plan: Principal Problem: Acute on chronic systolic (congestive) heart failure (EF 25-30% by TTE on 11/24/15):improving but still with elevated JVD and lower ext edema, received IV Lasix, weight down, less edema, Per chart review BP has been too soft in the past to tolerate Beta blockers or acei, Hydralazine etc.  Cr elevated after iv diuresis, diuretics held on 5/7 Cardiology input appreciated,   Active Problems: Hypervolemic Hyponatremia:secondary to acute CHF, fluctuating, Follow  Lactic Acidosis:no evidence of infection-afebrile-non toxic appearing-UA/CXR negative. Suspect from low flow state from decompensated CHF.  Patient with productive cough, from chf vs bronchitis? With h/o diabetes will start Trial of doxycycline, monitor  Afib: in sinus rhythm-CHA2DS2-VASc Score is 5-continue Eliquis. She was on Amiodarone in the recent past-which was discontinued due to elevated LFT's, she is on dig currently, cardiology following  CKD stage 3:suspected cardio-renal syndrome-creatinine seems stable-follow closely while on IV Lasix. Has been followed by [place].  Hx of HTN:BP currently soft-no indications for anti-hypertensives.  Diet controlled DM-2:Last A1C 3/24 7.3-CBG's stable    Severe Mitral Regurgitation:  likely secondary to cardiomyopathy-continue to optimize med treatment, and follow  with cards as outpatient  Pul OZH:YQMVHQ secondary to CHF-could be contributing to some leg edema.   Hypothyroidism:continue Levothyroxine  Hx of Rheumatoid Arthritis:has been on MTX in the past-d/c'd due to elevated LFT's, continue Plaquenil. Appears stable at present.  Deconditioning/Debility:PT eval-was at SNF-prior to this admission  DVT Prophylaxis: Eliquis  Code Status: Full code   Family Communication: Daughter at bedside  Disposition Plan: SNF in 1-2 days  Antimicrobial agents: None  Procedures: None  CONSULTS:  cardiology  Time spent: 25 minutes-Greater than 50% of this time was spent in counseling, explanation of diagnosis, planning of further management, and coordination of care.  MEDICATIONS: Anti-infectives    Start     Dose/Rate Route Frequency Ordered Stop   01/15/16 1500  doxycycline (VIBRA-TABS) tablet 100 mg     100 mg Oral Every 12 hours 01/15/16 1418     01/14/16 1800  vancomycin (VANCOCIN) IVPB 1000 mg/200 mL premix  Status:  Discontinued     1,000 mg 200 mL/hr over 60 Minutes Intravenous Every 24 hours 01/13/16 1932 01/13/16 2156   01/13/16 2300  hydroxychloroquine (PLAQUENIL) tablet 200 mg     200 mg Oral 2 times daily 01/13/16 2156     01/13/16 1900  vancomycin (VANCOCIN) IVPB 1000 mg/200 mL premix     1,000 mg 200 mL/hr over 60 Minutes Intravenous  Once 01/13/16 1851 01/13/16 2136      Scheduled Meds: . antiseptic oral rinse  7 mL Mouth Rinse q12n4p  . apixaban  5 mg Oral BID  . chlorhexidine  15 mL Mouth Rinse BID  . digoxin  0.0625 mg Oral Daily  . docusate sodium  200 mg Oral BID  . doxycycline  100 mg Oral Q12H  . folic acid  1 mg Oral Daily  . hydroxychloroquine  200 mg Oral BID  . insulin aspart  0-9 Units Subcutaneous TID WC  . latanoprost  1 drop Both Eyes QHS  . levothyroxine  88 mcg Oral QAC breakfast  . sodium chloride flush  3 mL Intravenous Q12H  . sodium chloride flush  3 mL Intravenous Q12H   Continuous  Infusions:  PRN Meds:.sodium chloride, acetaminophen **OR** acetaminophen, benzonatate, bisacodyl, dextromethorphan, HYDROcodone-acetaminophen, ondansetron **OR** ondansetron (ZOFRAN) IV, polyethylene glycol, sodium chloride flush   PHYSICAL EXAM: Vital signs: Filed Vitals:   01/15/16 1503 01/15/16 2249 01/16/16 0309 01/16/16 0638  BP: 94/66 100/65  98/62  Pulse: 87 88  82  Temp: 97.3 F (36.3 C) 97.9 F (36.6 C)  97.5 F (36.4 C)  TempSrc: Oral Oral  Oral  Resp: 20 20  18   Height:      Weight:   79.017 kg (174 lb 3.2 oz)   SpO2: 100% 100%  97%   Filed Weights   01/14/16 0450 01/15/16 0500 01/16/16 0309  Weight: 79.652 kg (175 lb 9.6 oz) 78.608 kg (173 lb 4.8 oz) 79.017 kg (174 lb 3.2 oz)   Body mass index is 28.13 kg/(m^2).   Gen Exam: Awake and alert with clear speech. Not in any distress  Neck: Supple,+ JVD.   Chest: B/L Clear-except a few bibasilar rales CVS: S1 S2 Regular, + syst murmur  Abdomen: soft, BS +, non tender, non distended.  Extremities: subsiding edema, lower extremities warm to touch. Neurologic: Non Focal.   Skin: No Rash or lesions   Wounds: N/A.   LABORATORY DATA: CBC:  Recent Labs Lab 01/13/16 1724  WBC 8.7  NEUTROABS 6.2  HGB 9.2*  HCT 27.9*  MCV 96.2  PLT 387    Basic Metabolic Panel:  Recent Labs Lab 01/13/16 1724 01/14/16 0508 01/15/16 0523 01/16/16 0515  NA 128* 133* 130* 131*  K 4.0 4.8 4.4 4.9  CL 90* 96* 95* 94*  CO2 25 25 23 23   GLUCOSE 181* 129* 92 93  BUN 19 19 26* 30*  CREATININE 1.38* 1.35* 1.67* 1.94*  CALCIUM 9.0 8.7* 8.9 9.1  MG  --   --   --  1.9    GFR: Estimated Creatinine Clearance: 23.3 mL/min (by C-G formula based on Cr of 1.94).  Liver Function Tests:  Recent Labs Lab 01/13/16 1724  AST 37  ALT 34  ALKPHOS 131*  BILITOT 1.0  PROT 8.1  ALBUMIN 2.9*   No results for input(s): LIPASE, AMYLASE in the last 168 hours. No results for input(s): AMMONIA in the last 168 hours.  Coagulation  Profile: No results for input(s): INR, PROTIME in the last 168 hours.  Cardiac Enzymes: No results for input(s): CKTOTAL, CKMB, CKMBINDEX, TROPONINI in the last 168 hours.  BNP (last 3 results) No results for input(s): PROBNP in the last 8760 hours.  HbA1C: No results for input(s): HGBA1C in the last 72 hours.  CBG:  Recent Labs Lab 01/15/16 1058 01/15/16 1702 01/15/16 2253 01/16/16 0739 01/16/16 1144  GLUCAP 169* 110* 88 79 158*    Lipid Profile: No results for input(s): CHOL, HDL, LDLCALC, TRIG, CHOLHDL, LDLDIRECT in the last 72 hours.  Thyroid Function Tests: No results for input(s): TSH, T4TOTAL, FREET4, T3FREE, THYROIDAB in the last 72 hours.  Anemia Panel: No results for input(s): VITAMINB12, FOLATE, FERRITIN, TIBC, IRON, RETICCTPCT in the last 72 hours.  Urine analysis:    Component  Value Date/Time   COLORURINE YELLOW 01/14/2016 0502   APPEARANCEUR CLEAR 01/14/2016 0502   LABSPEC 1.009 01/14/2016 0502   PHURINE 6.0 01/14/2016 0502   GLUCOSEU NEGATIVE 01/14/2016 0502   HGBUR NEGATIVE 01/14/2016 0502   BILIRUBINUR NEGATIVE 01/14/2016 0502   KETONESUR NEGATIVE 01/14/2016 0502   PROTEINUR NEGATIVE 01/14/2016 0502   UROBILINOGEN 0.2 01/27/2011 1919   NITRITE NEGATIVE 01/14/2016 0502   LEUKOCYTESUR NEGATIVE 01/14/2016 0502    Sepsis Labs: Lactic Acid, Venous    Component Value Date/Time   LATICACIDVEN 5.71* 01/13/2016 2020    MICROBIOLOGY: Recent Results (from the past 240 hour(s))  Culture, expectorated sputum-assessment     Status: None   Collection Time: 01/15/16  4:59 PM  Result Value Ref Range Status   Specimen Description SPUTUM  Final   Special Requests NONE  Final   Sputum evaluation   Final    THIS SPECIMEN IS ACCEPTABLE. RESPIRATORY CULTURE REPORT TO FOLLOW.   Report Status 01/15/2016 FINAL  Final  Culture, respiratory (NON-Expectorated)     Status: None (Preliminary result)   Collection Time: 01/15/16  4:59 PM  Result Value Ref Range  Status   Specimen Description SPUTUM  Final   Special Requests NONE  Final   Gram Stain PENDING  Incomplete   Culture   Final    Culture reincubated for better growth Performed at [facility]    Report Status PENDING  Incomplete  MRSA PCR Screening     Status: None   Collection Time: 01/15/16  5:25 PM  Result Value Ref Range Status   MRSA by PCR NEGATIVE NEGATIVE Final    Comment:        The GeneXpert MRSA Assay (FDA approved for NASAL specimens only), is one component of a comprehensive MRSA colonization surveillance program. It is not intended to diagnose MRSA infection nor to guide or monitor treatment for MRSA infections.     RADIOLOGY STUDIES/RESULTS: Dg Chest 2 View  01/13/2016  CLINICAL DATA:  Shortness of breath EXAM: CHEST  2 VIEW COMPARISON:  12/17/2015 chest radiograph. FINDINGS: Stable cardiomediastinal silhouette with mild cardiomegaly. No pneumothorax. No pleural effusion. New mild hazy opacities in the bilateral upper lobes. IMPRESSION: Stable mild cardiomegaly. New mild hazy opacities in the bilateral upper lobes, which are nonspecific and could represent pneumonia or mild pulmonary edema. Recommend follow-up PA and lateral post treatment chest radiographs in 4-6 weeks. Electronically Signed   By: Delbert Phenix M.D.   On: 01/13/2016 17:46     LOS: 3 days   Darline Faith, MD PhD  [facility] Pager:[phone]  If 7PM-7AM, please contact night-coverage [email or web address] Password [email or web address] 01/16/2016, 2:30 PM

## 2016-01-16 NOTE — Progress Notes (Signed)
Patient ID: Theresa Cortez, female   DOB: [birth year], 82 y.o.   MRN: [id number]     Subjective:    SOB improving, still with some cough. Ambulated the hall yesterday without significant symptoms.   Objective:   Temp:  [97.3 F (36.3 C)-97.9 F (36.6 C)] 97.5 F (36.4 C) (05/07 0865) Pulse Rate:  [82-88] 82 (05/07 0638) Resp:  [18-20] 18 (05/07 7846) BP: (94-100)/(62-66) 98/62 mmHg (05/07 0638) SpO2:  [97 %-100 %] 97 % (05/07 9629) Weight:  [174 lb 3.2 oz (79.017 kg)] 174 lb 3.2 oz (79.017 kg) (05/07 0309) Last BM Date: 01/15/16  Filed Weights   01/14/16 0450 01/15/16 0500 01/16/16 0309  Weight: 175 lb 9.6 oz (79.652 kg) 173 lb 4.8 oz (78.608 kg) 174 lb 3.2 oz (79.017 kg)    Intake/Output Summary (Last 24 hours) at 01/16/16 0840 Last data filed at 01/16/16 0243  Gross per 24 hour  Intake    120 ml  Output    703 ml  Net   -583 ml    Telemetry: episode of afib with aberrance ovrnight, now back in NSR  Exam:  General: NAD  HEENT: sclera clear, throat clear  Resp: faint crackles bilateral bases  Cardiac: RRR, 2/6 systolic murmur apex,JVD just below angle of jaw  GI: abdomen soft, NT, ND  MSK:  No LE Edema  Neuro: no focal deficits  Psych: appropriate affect  Lab Results:  Basic Metabolic Panel:  Recent Labs Lab 01/14/16 0508 01/15/16 0523 01/16/16 0515  NA 133* 130* 131*  K 4.8 4.4 4.9  CL 96* 95* 94*  CO2 25 23 23   GLUCOSE 129* 92 93  BUN 19 26* 30*  CREATININE 1.35* 1.67* 1.94*  CALCIUM 8.7* 8.9 9.1  MG  --   --  1.9    Liver Function Tests:  Recent Labs Lab 01/13/16 1724  AST 37  ALT 34  ALKPHOS 131*  BILITOT 1.0  PROT 8.1  ALBUMIN 2.9*    CBC:  Recent Labs Lab 01/13/16 1724  WBC 8.7  HGB 9.2*  HCT 27.9*  MCV 96.2  PLT 387    Cardiac Enzymes: No results for input(s): CKTOTAL, CKMB, CKMBINDEX, TROPONINI in the last 168 hours.  BNP: No results for input(s): PROBNP in the last 8760 hours.  Coagulation: No results for  input(s): INR in the last 168 hours.  ECG:   Medications:   Scheduled Medications: . antiseptic oral rinse  7 mL Mouth Rinse q12n4p  . apixaban  5 mg Oral BID  . chlorhexidine  15 mL Mouth Rinse BID  . digoxin  0.0625 mg Oral Daily  . docusate sodium  200 mg Oral BID  . doxycycline  100 mg Oral Q12H  . folic acid  1 mg Oral Daily  . hydroxychloroquine  200 mg Oral BID  . insulin aspart  0-9 Units Subcutaneous TID WC  . latanoprost  1 drop Both Eyes QHS  . levothyroxine  88 mcg Oral QAC breakfast  . polyethylene glycol  17 g Oral Daily  . sodium chloride flush  3 mL Intravenous Q12H  . sodium chloride flush  3 mL Intravenous Q12H     Infusions:     PRN Medications:  sodium chloride, acetaminophen **OR** acetaminophen, benzonatate, bisacodyl, dextromethorphan, HYDROcodone-acetaminophen, ondansetron **OR** ondansetron (ZOFRAN) IV, sodium chloride flush     Assessment/Plan    1. Acute on chronic combined systolic/diastolic HF - 11/2015 echo LVEF 25-30%, mod to severe MR, mild RV dysfunction, severe TR -  has not been on a BB or ACE-I secondary to continued hypotension.  - she received lasix 60mg  IV x 1 yesterday, negative 463 ml yesterday and negative since admission per charting. Significant uptrend in Cr and BUN, no diuretics today. Can restart lasix 40mg  oral daily starting tomorrow. From notes she has had prior labile Cr ranging 1.3-2.3 with known CKD.   2. PAF - This patients CHA2DS2-VASc Score and unadjusted Ischemic Stroke Rate (% per year) is equal to 7.2 % stroke rate/year from a score of 5 (CHF, HTN, DM, Age (2)). Continue Elqiuis for anticoagulation (renally adjusted dose).  - maintaining NSR thus far this admission. Has not tolerated beta blockers due to hypotension, started on low dose digoxin recently and tolerating.  - would follow renal function at this time, likely transient elevation. If Cr persistently above 1.5 will need to decrease eliquis to 2.5mg   bid and follow digoxin dosing closely.   3. Stage 3 CKD - Cr has ranged from 1.3 - 2.3 over the past few months - uptrend in Cr with diuresis, hold diuretics today. Can resume oral diuretics tomorrow.   4. Mitral regurgitation - moderate to severe by echo  5. Cough - ongoing cough, possible pneumonia on CXR. Defer consideration for abx to primary team.    From cardiac standpoint would consider one more day of monitoring given AKI.   , M.D.

## 2016-01-17 ENCOUNTER — Inpatient Hospital Stay (HOSPITAL_COMMUNITY): Payer: Medicare Other

## 2016-01-17 DIAGNOSIS — J849 Interstitial pulmonary disease, unspecified: Secondary | ICD-10-CM

## 2016-01-17 LAB — URINALYSIS, ROUTINE W REFLEX MICROSCOPIC
Bilirubin Urine: NEGATIVE
Glucose, UA: NEGATIVE mg/dL
Hgb urine dipstick: NEGATIVE
KETONES UR: NEGATIVE mg/dL
LEUKOCYTES UA: NEGATIVE
NITRITE: NEGATIVE
PH: 5 (ref 5.0–8.0)
Protein, ur: NEGATIVE mg/dL
SPECIFIC GRAVITY, URINE: 1.018 (ref 1.005–1.030)

## 2016-01-17 LAB — BASIC METABOLIC PANEL
Anion gap: 12 (ref 5–15)
BUN: 37 mg/dL — AB (ref 6–20)
CALCIUM: 9 mg/dL (ref 8.9–10.3)
CO2: 23 mmol/L (ref 22–32)
CREATININE: 2.11 mg/dL — AB (ref 0.44–1.00)
Chloride: 94 mmol/L — ABNORMAL LOW (ref 101–111)
GFR calc Af Amer: 24 mL/min — ABNORMAL LOW (ref >60)
GFR, EST NON AFRICAN AMERICAN: 21 mL/min — AB (ref >60)
GLUCOSE: 72 mg/dL (ref 65–99)
Potassium: 4.6 mmol/L (ref 3.5–5.1)
Sodium: 129 mmol/L — ABNORMAL LOW (ref 135–145)

## 2016-01-17 LAB — CULTURE, RESPIRATORY W GRAM STAIN: Culture: NORMAL

## 2016-01-17 LAB — GLUCOSE, CAPILLARY
GLUCOSE-CAPILLARY: 179 mg/dL — AB (ref 65–99)
GLUCOSE-CAPILLARY: 126 mg/dL — AB (ref 65–99)
GLUCOSE-CAPILLARY: 112 mg/dL — AB (ref 65–99)
Glucose-Capillary: 72 mg/dL (ref 65–99)

## 2016-01-17 LAB — CULTURE, RESPIRATORY

## 2016-01-17 LAB — OSMOLALITY, URINE: Osmolality, Ur: 448 mOsm/kg (ref 300–900)

## 2016-01-17 LAB — LACTIC ACID, PLASMA: Lactic Acid, Venous: 2.7 mmol/L (ref 0.5–2.0)

## 2016-01-17 LAB — CREATININE, URINE, RANDOM: Creatinine, Urine: 144.65 mg/dL

## 2016-01-17 LAB — SODIUM, URINE, RANDOM: Sodium, Ur: 16 mmol/L

## 2016-01-17 MED ORDER — APIXABAN 2.5 MG PO TABS
2.5000 mg | ORAL_TABLET | Freq: Two times a day (BID) | ORAL | Status: DC
  Administered 2016-01-17 – 2016-01-18 (×2): 2.5 mg via ORAL
  Filled 2016-01-17 (×3): qty 1

## 2016-01-17 MED ORDER — FUROSEMIDE 40 MG PO TABS
40.0000 mg | ORAL_TABLET | Freq: Every day | ORAL | Status: DC
  Administered 2016-01-17 – 2016-01-18 (×2): 40 mg via ORAL
  Filled 2016-01-17 (×2): qty 1

## 2016-01-17 NOTE — Progress Notes (Signed)
[facility] will have a SNF bed for pt on 01/18/16 if stable for d/c. CSW will assist with d/c planning to SNF.  Cori Razor LCSW [phone]

## 2016-01-17 NOTE — Progress Notes (Signed)
Subjective: Feels better.  No PND.  Persistent cough with yellow phlegm .  Objective: Vital signs in last 24 hours: Temp:  [97.6 F (36.4 C)-98.1 F (36.7 C)] 97.6 F (36.4 C) (05/08 0502) Pulse Rate:  [80-84] 80 (05/08 0502) Resp:  [18-20] 18 (05/08 0502) BP: (103-108)/(65-68) 108/65 mmHg (05/08 0502) SpO2:  [98 %-100 %] 99 % (05/08 0502) Weight:  [178 lb 12.8 oz (81.103 kg)] 178 lb 12.8 oz (81.103 kg) (05/08 0502) Last BM Date: 01/16/16  Intake/Output from previous day: 05/07 0701 - 05/08 0700 In: 240 [P.O.:240] Out: 901 [Urine:900; Stool:1] Intake/Output this shift:    Medications Scheduled Meds: . antiseptic oral rinse  7 mL Mouth Rinse q12n4p  . apixaban  5 mg Oral BID  . chlorhexidine  15 mL Mouth Rinse BID  . digoxin  0.0625 mg Oral Daily  . docusate sodium  200 mg Oral BID  . doxycycline  100 mg Oral Q12H  . famotidine  20 mg Oral QHS  . folic acid  1 mg Oral Daily  . hydroxychloroquine  200 mg Oral BID  . insulin aspart  0-9 Units Subcutaneous TID WC  . latanoprost  1 drop Both Eyes QHS  . levothyroxine  88 mcg Oral QAC breakfast  . sodium chloride flush  3 mL Intravenous Q12H  . sodium chloride flush  3 mL Intravenous Q12H   Continuous Infusions:  PRN Meds:.sodium chloride, acetaminophen **OR** acetaminophen, benzonatate, bisacodyl, dextromethorphan, HYDROcodone-acetaminophen, ondansetron **OR** ondansetron (ZOFRAN) IV, polyethylene glycol, sodium chloride flush  PE: General appearance: alert, cooperative and no distress Neck: Elevated JVD Lungs: mild basilar crackles Heart: regular rate and rhythm, S1, S2 normal, no murmur, click, rub or gallop Extremities: No LEE Pulses: 2+ and symmetric Skin: Warm and dry Neurologic: Grossly normal  BMET  Recent Labs  01/15/16 0523 01/16/16 0515 01/17/16 0519  NA 130* 131* 129*  K 4.4 4.9 4.6  CL 95* 94* 94*  CO2 23 23 23   GLUCOSE 92 93 72  BUN 26* 30* 37*  CREATININE 1.67* 1.94* 2.11*  CALCIUM 8.9  9.1 9.0    Assessment/Plan 82 y.o. female with past medical history of HTN, HLD, Hypothyroidism, combined systolic and diastolic CHF, moderate to severe MR, PAF (on Eliquis) Stage 3 CKD, and RA (on MTX) who presented to [facility] on 01/13/2016 for increased shortness of breath and a productive cough.   Principal Problem:   Acute on chronic combined systolic (congestive) and diastolic (congestive) heart failure (HCC) Active Problems:   Hyponatremia   Hypotension   Chronic renal insufficiency, stage IV (severe)   PAF (paroxysmal atrial fibrillation) (HCC)   Dyspnea   Cough   Cardiorenal disease  1. Acute on chronic combined systolic/diastolic HF - 11/2015 echo LVEF 25-30%, mod to severe MR, mild RV dysfunction, severe TR - has not been on a BB or ACE-I secondary to continued hypotension.  - Net fluids:  -0.67L/-1.4L .   she received lasix 60mg  IV x 1 on 5/6,  Significant uptrend in Cr and BUN.  1.94>>2.11 today.   -JVD elevated. Weight going up. She has severe TR on echo in addition to mod to sev MR.  - Restart lasix 40mg  oral daily today . From notes she has had prior labile Cr ranging 1.3-2.3 with known CKD.  -Will need to recheck BMET 3-4 days after DC -We discussed daily weight monitoring and low sodium diet.    2. PAF - This patients CHA2DS2-VASc Score and unadjusted Ischemic Stroke Rate (%  per year) is equal to 7.2 % stroke rate/year from a score of 5 (CHF, HTN, DM, Age (2)). Continue Elqiuis for anticoagulation (renally adjusted dose).  - maintaining NSR thus far this admission. Has not tolerated beta blockers due to hypotension, started on low dose digoxin recently and tolerating.  - would follow renal function at this time, likely transient elevation. If Cr persistently above 1.5 will need to decrease eliquis to 2.5mg  bid and follow digoxin dosing closely.   3. Stage 3 CKD - Cr has ranged from 1.3 - 2.3 over the past few months - uptrend in Cr with diuresis.  Last dose of  diuretics was 5/6.    4. Mitral regurgitation - moderate to severe by echo  5. Cough - ongoing cough, possible pneumonia on CXR. Defer consideration for abx to primary team.   When she is [facility], it will be to SNF/rehab.    LOS: 4 days    Theresa Cortez, Theresa Cortez 01/17/2016 8:55 AM  History and all data above reviewed.  Patient examined.  I agree with the findings as above.  She is breathing better but has paroxysms of coughing.  Evaluation for this is ongoing.   The patient exam reveals SEG:[id number]  ,  Lungs: Clear  ,  Abd: Positive bowel sounds, no rebound no guarding, Ext Trace edema  .  All available labs, radiology testing, previous records reviewed. Agree with documented assessment and plan. Plan as above.  I will likely reduce the Eliquis to 2.5 mg bid prior to discharge.    Theresa Cortez  12:07 PM  01/17/2016

## 2016-01-17 NOTE — Progress Notes (Signed)
CRITICAL VALUE ALERT  Critical value received:  Lactic acid 2.7  Date of notification:  01/17/16  Time of notification:  0620  Critical value read back: Yes   Nurse who received alert:  Governor Specking RN   MD notified (1st page): Benedetto Coons   Time of first page:  0645  MD notified (2nd page):  Time of second page:  Responding MD:    Time MD responded:

## 2016-01-17 NOTE — Progress Notes (Signed)
ANTICOAGULATION CONSULT NOTE  Pharmacy Consult for Eliquis (apixaban) Indication: atrial fibrillation  Patient Measurements: Height: 5\' 6"  (167.6 cm) Weight: 178 lb 12.8 oz (81.103 kg) IBW/kg (Calculated) : 59.3  Vital Signs: Temp: 97.6 F (36.4 C) (05/08 0502) Temp Source: Oral (05/08 0502) BP: 108/65 mmHg (05/08 0502) Pulse Rate: 80 (05/08 0502)  Labs:  Recent Labs  01/15/16 0523 01/16/16 0515 01/17/16 0519  CREATININE 1.67* 1.94* 2.11*    Estimated Creatinine Clearance: 21.7 mL/min (by C-G formula based on Cr of 2.11).   Medications:  Scheduled:  . antiseptic oral rinse  7 mL Mouth Rinse q12n4p  . apixaban  5 mg Oral BID  . chlorhexidine  15 mL Mouth Rinse BID  . digoxin  0.0625 mg Oral Daily  . docusate sodium  200 mg Oral BID  . doxycycline  100 mg Oral Q12H  . famotidine  20 mg Oral QHS  . folic acid  1 mg Oral Daily  . furosemide  40 mg Oral Daily  . hydroxychloroquine  200 mg Oral BID  . insulin aspart  0-9 Units Subcutaneous TID WC  . latanoprost  1 drop Both Eyes QHS  . levothyroxine  88 mcg Oral QAC breakfast  . sodium chloride flush  3 mL Intravenous Q12H  . sodium chloride flush  3 mL Intravenous Q12H    Assessment: 85 yoF admitted on 5/4 with SOB and cough from decompensated CHF.  PMH is also significant for atrial fibrillation on chronic apixaban education.  She is on the low dose apixaban 2.5mg  BID at home, but with improved renal function she was increased to full dose apixaban 5mg  BID on 5/5 during this admission.  However, now SCr is elevated and increasing and meets criteria for reduced dose again.  Per discussion with Dr. 7/4, pharmacy is consulted to renally adjust apixaban.  Today, 01/17/2016: SCr 2.11 with CrCl ~ 21 ml/min. CBC: Hgb 9.2 (low, decreasing), Plt remain WNL No bleeding or complications reported  Goal of Therapy:  Monitor platelets by anticoagulation protocol: Yes   Plan:  Reduce to apixaban 2.5 mg PO BID Pharmacy will s/o  dosing consult, but will continue to follow peripherally.  Roda Shutters PharmD, BCPS Pager [phone] 01/17/2016 2:46 PM

## 2016-01-17 NOTE — Progress Notes (Signed)
PROGRESS NOTE        PATIENT DETAILS Name: Theresa Cortez Age: 82 y.o. Sex: female Date of Birth: 04-15-1930 Admit Date: 01/13/2016 Admitting Physician Delano Metz, MD PZW:CHEN Shona Simpson, MD Outpatient Specialists:Dr Croitoru  Brief Narrative: [name] with hx of HTN,DM, systolic CHF diagnosed March 2017 w LVEF 25-30% and heart cath negative for sig CAD presented with decompensated systolic CHF.  Subjective: C/o coughing spells, not getting better, denies chest pain,  Cr worsening,  daughter in room  Assessment/Plan: Principal Problem: Acute on chronic systolic (congestive) heart failure (EF 25-30% by TTE on 11/24/15): improving but still with elevated JVD and lower ext edema, received IV Lasix, weight down, less edema, Per chart review BP has been too soft in the past to tolerate Beta blockers or acei, Hydralazine etc.  Cr elevated after iv diuresis, diuretics held on 5/7 Cardiology input appreciated,   Persistent cough: CT chest concerning for interstitial lung disease. Pulmonology consulted.  Active Problems: Hypervolemic Hyponatremia:secondary to acute CHF, fluctuating, urine sodium/osmo pending  Lactic Acidosis:no evidence of infection-afebrile-non toxic appearing-UA/CXR negative. Suspect from low flow state from decompensated CHF.  Patient with productive cough, from chf vs bronchitis? With h/o diabetes will start Trial of doxycycline, monitor  Afib: in sinus rhythm-CHA2DS2-VASc Score is 5-continue Eliquis. She was on Amiodarone in the recent past-which was discontinued due to elevated LFT's, she is on dig currently, cardiology following  CKD stage 3:suspected cardio-renal syndrome-creatinine seems stable-follow closely while on IV Lasix. Has been followed by [place].  Hx of HTN:BP currently soft-no indications for anti-hypertensives.  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   Hypothyroidism:continue Levothyroxine  Hx of Rheumatoid Arthritis:has been on MTX in the past-d/c'd due to elevated LFT's, continue Plaquenil. Appears stable at present.  Deconditioning/Debility:PT eval-was at SNF-prior to this admission  DVT Prophylaxis: Eliquis  Code Status: Full code   Family Communication: Daughter at bedside  Disposition Plan: SNF in 1-2 days  Antimicrobial agents: None  Procedures: None  CONSULTS:  cardiology  Pulmonology   Time spent: 25 minutes-Greater than 50% of this time was spent in counseling, explanation of diagnosis, planning of further management, and coordination of care.  MEDICATIONS: Anti-infectives    Start     Dose/Rate Route Frequency Ordered Stop   01/15/16 1500  doxycycline (VIBRA-TABS) tablet 100 mg     100 mg Oral Every 12 hours 01/15/16 1418     01/14/16 1800  vancomycin (VANCOCIN) IVPB 1000 mg/200 mL premix  Status:  Discontinued     1,000 mg 200 mL/hr over 60 Minutes Intravenous Every 24 hours 01/13/16 1932 01/13/16 2156   01/13/16 2300  hydroxychloroquine (PLAQUENIL) tablet 200 mg     200 mg Oral 2 times daily 01/13/16 2156     01/13/16 1900  vancomycin (VANCOCIN) IVPB 1000 mg/200 mL premix     1,000 mg 200 mL/hr over 60 Minutes Intravenous  Once 01/13/16 1851 01/13/16 2136      Scheduled Meds: . antiseptic oral rinse  7 mL Mouth Rinse q12n4p  . apixaban  5 mg Oral BID  . chlorhexidine  15 mL Mouth Rinse BID  . digoxin  0.0625 mg Oral Daily  . docusate sodium  200 mg Oral BID  . doxycycline  100 mg Oral Q12H  . famotidine  20 mg Oral QHS  . folic acid  1 mg Oral Daily  . hydroxychloroquine  200 mg Oral BID  . insulin aspart  0-9 Units Subcutaneous TID WC  . latanoprost  1 drop Both Eyes QHS  . levothyroxine  88 mcg Oral QAC  breakfast  . sodium chloride flush  3 mL Intravenous Q12H  . sodium chloride flush  3 mL Intravenous Q12H   Continuous Infusions:  PRN Meds:.sodium chloride, acetaminophen **OR** acetaminophen, benzonatate, bisacodyl, dextromethorphan, HYDROcodone-acetaminophen, ondansetron **OR** ondansetron (ZOFRAN) IV, polyethylene glycol, sodium chloride flush   PHYSICAL EXAM: Vital signs: Filed Vitals:   01/16/16 0638 01/16/16 1517 01/16/16 2040 01/17/16 0502  BP: 98/62 103/65 107/68 108/65  Pulse: 82 81 84 80  Temp: 97.5 F (36.4 C) 97.7 F (36.5 C) 98.1 F (36.7 C) 97.6 F (36.4 C)  TempSrc: Oral Oral Oral Oral  Resp: 18 19 20 18   Height:      Weight:    81.103 kg (178 lb 12.8 oz)  SpO2: 97% 98% 100% 99%   Filed Weights   01/15/16 0500 01/16/16 0309 01/17/16 0502  Weight: 78.608 kg (173 lb 4.8 oz) 79.017 kg (174 lb 3.2 oz) 81.103 kg (178 lb 12.8 oz)   Body mass index is 28.87 kg/(m^2).   Gen Exam: Awake and alert with clear speech. Not in any distress  Neck: Supple,+ JVD.   Chest: B/L Clear-except a few bibasilar rales CVS: S1 S2 Regular, + syst murmur  Abdomen: soft, BS +, non tender, non distended.  Extremities: subsiding edema, lower extremities warm to touch. Neurologic: Non Focal.   Skin: No Rash or lesions   Wounds: N/A.   LABORATORY DATA: CBC:  Recent Labs Lab 01/13/16 1724  WBC 8.7  NEUTROABS 6.2  HGB 9.2*  HCT 27.9*  MCV 96.2  PLT 387    Basic Metabolic Panel:  Recent Labs Lab 01/13/16 1724 01/14/16 0508 01/15/16 0523 01/16/16 0515 01/17/16 0519  NA 128* 133* 130* 131* 129*  K 4.0 4.8 4.4 4.9 4.6  CL 90* 96* 95* 94* 94*  CO2 25 25 23 23 23   GLUCOSE 181* 129* 92 93 72  BUN 19 19 26* 30* 37*  CREATININE 1.38* 1.35* 1.67* 1.94* 2.11*  CALCIUM 9.0 8.7* 8.9 9.1 9.0  MG  --   --   --  1.9  --     GFR: Estimated Creatinine Clearance: 21.7 mL/min (by C-G formula based on Cr of 2.11).  Liver Function Tests:  Recent Labs Lab 01/13/16 1724  AST  37  ALT 34  ALKPHOS 131*  BILITOT 1.0  PROT 8.1  ALBUMIN 2.9*   No results for input(s): LIPASE, AMYLASE in the last 168 hours. No results for input(s): AMMONIA in the last 168 hours.  Coagulation Profile: No results for input(s): INR, PROTIME in the last 168 hours.  Cardiac Enzymes: No results for input(s): CKTOTAL, CKMB, CKMBINDEX, TROPONINI in the last 168 hours.  BNP (last 3 results) No results for input(s): PROBNP in the last 8760 hours.  HbA1C: No results for input(s): HGBA1C in the last 72 hours.  CBG:  Recent Labs Lab 01/15/16 2253 01/16/16 0739 01/16/16 1144 01/16/16 1637 01/16/16 2037  GLUCAP 88 79 158* 110* 137*    Lipid Profile: No results for input(s): CHOL, HDL, LDLCALC, TRIG, CHOLHDL, LDLDIRECT in the last 72 hours.  Thyroid  Function Tests: No results for input(s): TSH, T4TOTAL, FREET4, T3FREE, THYROIDAB in the last 72 hours.  Anemia Panel: No results for input(s): VITAMINB12, FOLATE, FERRITIN, TIBC, IRON, RETICCTPCT in the last 72 hours.  Urine analysis:    Component Value Date/Time   COLORURINE YELLOW 01/14/2016 0502   APPEARANCEUR CLEAR 01/14/2016 0502   LABSPEC 1.009 01/14/2016 0502   PHURINE 6.0 01/14/2016 0502   GLUCOSEU NEGATIVE 01/14/2016 0502   HGBUR NEGATIVE 01/14/2016 0502   BILIRUBINUR NEGATIVE 01/14/2016 0502   KETONESUR NEGATIVE 01/14/2016 0502   PROTEINUR NEGATIVE 01/14/2016 0502   UROBILINOGEN 0.2 01/27/2011 1919   NITRITE NEGATIVE 01/14/2016 0502   LEUKOCYTESUR NEGATIVE 01/14/2016 0502    Sepsis Labs: Lactic Acid, Venous    Component Value Date/Time   LATICACIDVEN 2.7* 01/17/2016 0519    MICROBIOLOGY: Recent Results (from the past 240 hour(s))  Culture, expectorated sputum-assessment     Status: None   Collection Time: 01/15/16  4:59 PM  Result Value Ref Range Status   Specimen Description SPUTUM  Final   Special Requests NONE  Final   Sputum evaluation   Final    THIS SPECIMEN IS ACCEPTABLE. RESPIRATORY  CULTURE REPORT TO FOLLOW.   Report Status 01/15/2016 FINAL  Final  Culture, respiratory (NON-Expectorated)     Status: None   Collection Time: 01/15/16  4:59 PM  Result Value Ref Range Status   Specimen Description SPUTUM  Final   Special Requests NONE  Final   Gram Stain   Final    ABUNDANT WBC PRESENT, PREDOMINANTLY PMN FEW SQUAMOUS EPITHELIAL CELLS PRESENT MODERATE GRAM POSITIVE COCCI IN PAIRS IN CLUSTERS IN CHAINS FEW GRAM NEGATIVE RODS Performed at [facility]    Culture   Final    NORMAL OROPHARYNGEAL FLORA Performed at [facility]    Report Status 01/17/2016 FINAL  Final  MRSA PCR Screening     Status: None   Collection Time: 01/15/16  5:25 PM  Result Value Ref Range Status   MRSA by PCR NEGATIVE NEGATIVE Final    Comment:        The GeneXpert MRSA Assay (FDA approved for NASAL specimens only), is one component of a comprehensive MRSA colonization surveillance program. It is not intended to diagnose MRSA infection nor to guide or monitor treatment for MRSA infections.     RADIOLOGY STUDIES/RESULTS: Dg Chest 2 View  01/13/2016  CLINICAL DATA:  Shortness of breath EXAM: CHEST  2 VIEW COMPARISON:  12/17/2015 chest radiograph. FINDINGS: Stable cardiomediastinal silhouette with mild cardiomegaly. No pneumothorax. No pleural effusion. New mild hazy opacities in the bilateral upper lobes. IMPRESSION: Stable mild cardiomegaly. New mild hazy opacities in the bilateral upper lobes, which are nonspecific and could represent pneumonia or mild pulmonary edema. Recommend follow-up PA and lateral post treatment chest radiographs in 4-6 weeks. Electronically Signed   By: Delbert Phenix M.D.   On: 01/13/2016 17:46     LOS: 4 days   Nabeeha Badertscher, MD PhD  [facility] Pager:[phone]  If 7PM-7AM, please contact night-coverage [email or web address] Password [email or web address] 01/17/2016, 8:04 AM

## 2016-01-17 NOTE — Care Management Important Message (Signed)
Important Message  Patient Details  Name: Theresa Cortez MRN: [id number] Date of Birth: Jul 14, 1930   Medicare Important Message Given:  Yes    Haskell Flirt 01/17/2016, 9:32 AMImportant Message  Patient Details  Name: Theresa Cortez MRN: [id number] Date of Birth: 16-May-1930   Medicare Important Message Given:  Yes    Haskell Flirt 01/17/2016, 9:32 AM

## 2016-01-18 ENCOUNTER — Inpatient Hospital Stay (HOSPITAL_COMMUNITY): Payer: Medicare Other

## 2016-01-18 LAB — CBC
HCT: 32.8 % — ABNORMAL LOW (ref 36.0–46.0)
HEMOGLOBIN: 11 g/dL — AB (ref 12.0–15.0)
MCH: 31.8 pg (ref 26.0–34.0)
MCHC: 33.5 g/dL (ref 30.0–36.0)
MCV: 94.8 fL (ref 78.0–100.0)
Platelets: 269 10*3/uL (ref 150–400)
RBC: 3.46 MIL/uL — AB (ref 3.87–5.11)
RDW: 16.5 % — AB (ref 11.5–15.5)
WBC: 6.4 10*3/uL (ref 4.0–10.5)

## 2016-01-18 LAB — BASIC METABOLIC PANEL
Anion gap: 14 (ref 5–15)
BUN: 36 mg/dL — AB (ref 6–20)
CALCIUM: 9.2 mg/dL (ref 8.9–10.3)
CHLORIDE: 94 mmol/L — AB (ref 101–111)
CO2: 21 mmol/L — AB (ref 22–32)
CREATININE: 2.01 mg/dL — AB (ref 0.44–1.00)
GFR calc Af Amer: 25 mL/min — ABNORMAL LOW (ref >60)
GFR calc non Af Amer: 22 mL/min — ABNORMAL LOW (ref >60)
GLUCOSE: 70 mg/dL (ref 65–99)
Potassium: 5 mmol/L (ref 3.5–5.1)
Sodium: 129 mmol/L — ABNORMAL LOW (ref 135–145)

## 2016-01-18 LAB — GLUCOSE, CAPILLARY
Glucose-Capillary: 83 mg/dL (ref 65–99)
Glucose-Capillary: 167 mg/dL — ABNORMAL HIGH (ref 65–99)
Glucose-Capillary: 66 mg/dL (ref 65–99)

## 2016-01-18 LAB — DIGOXIN LEVEL: Digoxin Level: 0.4 ng/mL — ABNORMAL LOW (ref 0.8–2.0)

## 2016-01-18 MED ORDER — ACETAMINOPHEN 325 MG PO TABS: 650.0000 mg | ORAL_TABLET | Freq: Two times a day (BID) | ORAL | Status: DC | PRN

## 2016-01-18 MED ORDER — FAMOTIDINE 20 MG PO TABS: 20.0000 mg | ORAL_TABLET | Freq: Every day | ORAL | Status: DC

## 2016-01-18 MED ORDER — BENZONATATE 200 MG PO CAPS: 200.0000 mg | ORAL_CAPSULE | Freq: Three times a day (TID) | ORAL | Status: DC | PRN

## 2016-01-18 MED ORDER — DOXYCYCLINE HYCLATE 100 MG PO TABS: 100.0000 mg | ORAL_TABLET | Freq: Two times a day (BID) | ORAL | Status: DC

## 2016-01-18 MED ORDER — DIGOXIN 62.5 MCG PO TABS: 0.0625 mg | ORAL_TABLET | Freq: Every day | ORAL | Status: DC

## 2016-01-18 MED ORDER — BENZONATATE 100 MG PO CAPS
200.0000 mg | ORAL_CAPSULE | Freq: Three times a day (TID) | ORAL | Status: DC | PRN
  Administered 2016-01-18: 200 mg via ORAL
  Filled 2016-01-18: qty 2

## 2016-01-18 MED ORDER — HYDROCODONE-HOMATROPINE 5-1.5 MG/5ML PO SYRP: 5.0000 mL | ORAL_SOLUTION | Freq: Every evening | ORAL | Status: DC | PRN

## 2016-01-18 MED ORDER — FUROSEMIDE 40 MG PO TABS: 40.0000 mg | ORAL_TABLET | Freq: Every day | ORAL | Status: DC

## 2016-01-18 NOTE — Discharge Summary (Signed)
Discharge Summary  Theresa Cortez TFT:[id number] DOB: Apr 04, 1930  PCP: Gwenyth Bender, MD  Admit date: 01/13/2016 Discharge date: 01/18/2016  Time spent: >77mins  Recommendations for Outpatient Follow-up:  1. F/u with PMD within a week  for hospital discharge follow up, repeat cbc/bmp at follow up 2. F/u with cardiology for paf and chf, she is newly started on digoxin from this hospitalization. 3. F/u with pulmonology for outpatient lung function test 4. F/u with nephrology Dr Kathrene Bongo for CKD III 5. F/u with gastroenterology for Smooth distal esophageal stricture  Discharge Diagnoses:  Active Hospital Problems   Diagnosis Date Noted  . Acute on chronic combined systolic (congestive) and diastolic (congestive) heart failure (HCC) 01/13/2016  . Interstitial lung disease (HCC)   . Dyspnea 01/13/2016  . Cough 01/13/2016  . Cardiorenal disease 01/13/2016  . Chronic renal insufficiency, stage IV (severe) 12/21/2015  . PAF (paroxysmal atrial fibrillation) (HCC) 12/21/2015  . Hypotension 12/15/2015  . Hyponatremia 12/15/2015    Resolved Hospital Problems   Diagnosis Date Noted Date Resolved  No resolved problems to display.    Discharge Condition: stable  Diet recommendation: heart healthy/carb modified  Filed Weights   01/16/16 0309 01/17/16 0502 01/18/16 0522  Weight: 79.017 kg (174 lb 3.2 oz) 81.103 kg (178 lb 12.8 oz) 78 kg (171 lb 15.3 oz)    History of present illness:  Chief Complaint: SOB and cough  HPI: Theresa Cortez is a 82 y.o. female with hx of HTN,DM, systolic CHF diagnosed March 2017 w LVEF 25-30% and heart cath negative for sig CAD. Is f/b cards and renal. Presents today with SOB and cough,difficulty walking at [facility] due to SOB. No purulent sputum, mucous is clear, no fevers, chills, or chest pain. Symptoms present last 3-5 days and worsening. NO orthopnea. Last week lasix was lowered from 40 bid to 40 daily due to some lab results. They saw Dr  Kathrene Bongo recently at [facility]. In ED BP 107/56, HR 100, EKG w NSR and LBBB, CXR w very faint upper lobes densities, poss pulm edema, also bilat effusions , CM. RA Sa O2 just now is 99%.   Patient is widowed since 53. Had 6 children. Hx diverticulitis, CVA 1997 w L side weak and recently dx'd w CHF as above. In her spare time she likes to cook and walk and pray and other things. In wheelchair the last several days, unable to use walker due to SOB. No hemoptysis.    Chart review: Mar 13- 21, 2017 > Acute CHF,HTN, DM, AKI, syst/diast CHF, LVEF 25-30%, MR/ TR/ pulm HTN. Afib with RVR. No acei/ ARB due to [place] and [employer]. R / L heartcath showed nonocclusive CAD andpulm HTN. Medical Rx. Severe MR per cards likely due to CM. ^LFT"s due to CHF April 5-10, 2017 > vomiting w abd pain, also cough, flu+B, acute/CKD. Given IVF then got fluid overloaded then diuresed. Low Na rx with lasix and NaCl tabs.   Hospital Course:  Principal Problem:   Acute on chronic combined systolic (congestive) and diastolic (congestive) heart failure (HCC) Active Problems:   Hyponatremia   Hypotension   Chronic renal insufficiency, stage IV (severe)   PAF (paroxysmal atrial fibrillation) (HCC)   Dyspnea   Cough   Cardiorenal disease   Interstitial lung disease (HCC)  Acute on chronic systolic (congestive) heart failure (EF 25-30% by TTE on 11/24/15): improving but still with elevated JVD and lower ext edema, received IV Lasix, weight down, less edema, Per chart review  BP has been too soft in the past to tolerate Beta blockers or acei, Hydralazine etc.  Cr elevated after iv diuresis, diuretics held on 5/7 Cardiology input appreciated, diuretics restarted at lasix 40mg  po daily at discharge, need to close monitor volume status and cr level, continue outpatient cardiology follow up  Persistent cough: CT chest concerning for interstitial lung disease. Pulmonology consulted. Need outpatient PFT and pulm  follow up. Barium esophagram no GE reflux demonstrated, does has smooth distal esophageal stricture, patient take pills and meals without problem, no n/v, i have discussed with GI NP Doug Sou who advised outpatient follow up with GI.   Active Problems: Hypervolemic Hyponatremia: secondary to acute CHF, fluctuating, close monitor outpatient. urine sodium 16/osmo 448  Lactic Acidosis:no evidence of infection-afebrile-non toxic appearing-UA/CXR negative. Suspect from low flow state from decompensated CHF.  Patient with productive cough, from chf vs bronchitis? With h/o diabetes, started Trial of doxycycline, monitor  Afib: in sinus rhythm-CHA2DS2-VASc Score is 5-continue Eliquis. She was on Amiodarone in the recent past-which was discontinued due to elevated LFT's, s digoxin started during this hospitalization, cardiology following  CKD stage 3:suspected cardio-renal syndrome-creatinine seems stable-follow closely while on IV Lasix. Has been followed by [place].  Hx of HTN:BP currently soft-no indications for anti-hypertensives.  Diet controlled DM-2:Last A1C 3/24 7.3-CBG's stable   Severe Mitral Regurgitation: likely secondary to cardiomyopathy-continue to optimize med treatment, and follow with cards as outpatient  Pul GNO:IBBCWU secondary to CHF-could be contributing to some leg edema.   Hypothyroidism:continue Levothyroxine  Hx of Rheumatoid Arthritis:has been on MTX in the past-d/c'd due to elevated LFT's, continue Plaquenil. Appears stable at present.  Deconditioning/Debility:PT eval-was at SNF-prior to this admission  DVT Prophylaxis: Eliquis  Code Status: Full code   Family Communication: Daughter at bedside  Disposition Plan: SNF   Antimicrobial agents: Oral doxycycline  Procedures: None  CONSULTS: cardiology  Pulmonology   Discharge Exam: BP 113/70 mmHg  Pulse 80  Temp(Src) 98 F (36.7 C) (Oral)  Resp 20  Ht 5\' 6"  (1.676 m)  Wt 78 kg (171  lb 15.3 oz)  BMI 27.77 kg/m2  SpO2 98%  Gen Exam: frail elderly, Awake and alert with clear speech. Not in any distress  Neck: Supple,+ JVD.  Chest: B/L Clear-except a few bibasilar rales CVS: S1 S2 Regular, + syst murmur  Abdomen: soft, BS +, non tender, non distended.  Extremities: subsiding edema, lower extremities warm to touch. Neurologic: Non Focal.  Skin: No Rash or lesions  Wounds: N/A.    Discharge Instructions You were cared for by a hospitalist during your hospital stay. If you have any questions about your discharge medications or the care you received while you were in the hospital after you are discharged, you can call the unit and asked to speak with the hospitalist on call if the hospitalist that took care of you is not available. Once you are discharged, your primary care physician will handle any further medical issues. Please note that NO REFILLS for any discharge medications will be authorized once you are discharged, as it is imperative that you return to your primary care physician (or establish a relationship with a primary care physician if you do not have one) for your aftercare needs so that they can reassess your need for medications and monitor your lab values.      Discharge Instructions    Diet - low sodium heart healthy    Complete by:  As directed   Carb modified  Increase activity slowly    Complete by:  As directed             Medication List    STOP taking these medications        dextromethorphan 7.5 MG/5ML Syrp     diclofenac sodium 1 % Gel  Commonly known as:  VOLTAREN      TAKE these medications        acetaminophen 325 MG tablet  Commonly known as:  TYLENOL  Take 2 tablets (650 mg total) by mouth every 12 (twelve) hours as needed for moderate pain.     albuterol (2.5 MG/3ML) 0.083% nebulizer solution  Commonly known as:  PROVENTIL  Take 2.5 mg by nebulization every 4 (four) hours as needed for wheezing or shortness of  breath.     apixaban 2.5 MG Tabs tablet  Commonly known as:  ELIQUIS  Take 1 tablet (2.5 mg total) by mouth 2 (two) times daily.     benzonatate 200 MG capsule  Commonly known as:  TESSALON  Take 1 capsule (200 mg total) by mouth 3 (three) times daily as needed for cough.     Digoxin 62.5 MCG Tabs  Take 0.0625 mg by mouth daily.     docusate sodium 100 MG capsule  Commonly known as:  COLACE  Take 2 capsules (200 mg total) by mouth 2 (two) times daily.     doxycycline 100 MG tablet  Commonly known as:  VIBRA-TABS  Take 1 tablet (100 mg total) by mouth every 12 (twelve) hours.     famotidine 20 MG tablet  Commonly known as:  PEPCID  Take 1 tablet (20 mg total) by mouth at bedtime.     folic acid 1 MG tablet  Commonly known as:  FOLVITE  Take 1 mg by mouth daily.     furosemide 40 MG tablet  Commonly known as:  LASIX  Take 1 tablet (40 mg total) by mouth daily.     HYDROcodone-homatropine 5-1.5 MG/5ML syrup  Commonly known as:  HYCODAN  Take 5 mLs by mouth at bedtime as needed for cough. 47ml daily at bedtime for 14 days. Repeat ultrasound on 01/18/16.     hydroxychloroquine 200 MG tablet  Commonly known as:  PLAQUENIL  Take 200 mg by mouth 2 (two) times daily.     insulin aspart 100 UNIT/ML injection  Commonly known as:  novoLOG  Inject 0-11 Units into the skin 2 (two) times daily before a meal. Check glucose at 7:30am and 4:30pm.  60-100: 0 units 101-150: 2 units 151-200: 3 units 201-250: 5 units 251-300: 7 units 301-350: 9 units >350: 11 units     latanoprost 0.005 % ophthalmic solution  Commonly known as:  XALATAN  Place 1 drop into both eyes at bedtime.     levothyroxine 88 MCG tablet  Commonly known as:  SYNTHROID, LEVOTHROID  Take 88 mcg by mouth daily before breakfast.     loratadine 10 MG tablet  Commonly known as:  CLARITIN  Take 10 mg by mouth daily.     polyethylene glycol packet  Commonly known as:  MIRALAX / GLYCOLAX  Take 17 g by mouth daily.         Allergies  Allergen Reactions  . Penicillins Swelling    Has patient had a PCN reaction causing immediate rash, facial/tongue/throat swelling, SOB or lightheadedness with hypotension: unknown Has patient had a PCN reaction causing severe rash involving mucus membranes or skin necrosis: unknown Has patient had  a PCN reaction that required hospitalization: unknown Has patient had a PCN reaction occurring within the last 10 years: unknown If all of the above answers are "NO", then may proceed with Cephalosporin use.    Follow-up Information    Follow up with Max Fickle, MD In 1 month.   Specialty:  Pulmonary Disease   Contact information:   [address] [place] [address] [phone]       Follow up with CROITORU,MIHAI, MD In 1 month.   Specialty:  Cardiology   Why:  Jobe Igo information:   [address] [place] [address] [phone]       Follow up with Gwenyth Bender, MD In 1 week.   Specialty:  Internal Medicine   Why:  hospital discharge follow up, repeat cbc/bmp at follow up   Contact information:   [address] [place] [address] [phone]       Follow up with ZEHR, JESSICA D., PA-C In 1 month.   Specialty:  Gastroenterology   Why:  smooth distal esophageal stricture, early if symptomatic with n/v.   Contact information:   [address] [place] [address] [phone]        The results of significant diagnostics from this hospitalization (including imaging, microbiology, ancillary and laboratory) are listed below for reference.    Significant Diagnostic Studies: Dg Chest 2 View  01/13/2016  CLINICAL DATA:  Shortness of breath EXAM: CHEST  2 VIEW COMPARISON:  12/17/2015 chest radiograph. FINDINGS: Stable cardiomediastinal silhouette with mild cardiomegaly. No pneumothorax. No pleural effusion. New mild hazy opacities in the bilateral upper lobes. IMPRESSION: Stable mild cardiomegaly. New mild hazy opacities in  the bilateral upper lobes, which are nonspecific and could represent pneumonia or mild pulmonary edema. Recommend follow-up PA and lateral post treatment chest radiographs in 4-6 weeks. Electronically Signed   By: Delbert Phenix M.D.   On: 01/13/2016 17:46   Ct Chest Wo Contrast  01/17/2016  CLINICAL DATA:  Worsening cough for several days. Recent diagnosis of pneumonia. Inpatient. EXAM: CT CHEST WITHOUT CONTRAST TECHNIQUE: Multidetector CT imaging of the chest was performed following the standard protocol without IV contrast. COMPARISON:  No prior chest CT. 01/13/2016 chest radiograph. 12/16/2015 CT abdomen/pelvis. FINDINGS: Mediastinum/Nodes: Mild cardiomegaly. No significant pericardial fluid/thickening. Left anterior descending coronary atherosclerosis. Atherosclerotic nonaneurysmal thoracic aorta. Normal caliber pulmonary arteries. Normal visualized thyroid. Normal esophagus. No pathologically enlarged axillary, mediastinal or gross hilar lymph nodes, noting limited sensitivity for the detection of hilar adenopathy on this noncontrast study. Lungs/Pleura: No pneumothorax. Small right and trace left layering pleural effusions. There are prominent patchy regions of subpleural reticulation, subpleural lines and traction bronchiectasis throughout both lungs, basilar predominant. There are areas of possible early honeycombing in the right lower lobe (series 5/images 55, 66 and 79). These findings were present at the lung bases on the 12/16/2015 CT abdomen study, with possible progression of the suspected peripheral focus of early honeycombing since 12/16/2015. Patchy mild ground-glass opacity at the periphery of the apical upper lobes. Otherwise no acute consolidative airspace disease, significant solid pulmonary nodules or lung masses. Upper abdomen: Unremarkable. Musculoskeletal: No aggressive appearing focal osseous lesions. Mild-to-moderate degenerative changes in the thoracic spine. IMPRESSION: 1. Spectrum of  findings suggestive of interstitial lung disease, including prominent patchy basilar-predominant regions of subpleural reticulation, subpleural lines, traction bronchiectasis and possible early honeycombing. Findings were present at the lung bases on 12/16/2015 CT abdomen study, with possible progression of these findings since  12/16/2015. 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 Pulmonology consultation advised. A high-resolution chest CT study in 6 months is recommended to assess for temporal pattern stability. 2. Cardiomegaly and small right and trace left layering pleural effusions. Mild patchy ground-glass opacity in the apical upper lobes could indicate a component of mild superimposed pulmonary edema. 3. One vessel coronary atherosclerosis. Electronically Signed   By: Delbert Phenix M.D.   On: 01/17/2016 13:42   Dg Esophagus  01/18/2016  CLINICAL DATA:  Dysphagia.  Cough. EXAM: ESOPHOGRAM/BARIUM SWALLOW TECHNIQUE: Single contrast examination was performed using  thin barium. FLUOROSCOPY TIME:  Radiation Exposure Index (as provided by the fluoroscopic device): 35.5 mGy If the device does not provide the exposure index: Fluoroscopy Time:  1 minutes and 50 seconds. Number of Acquired Images:  5 COMPARISON:  CT scan 01/17/2016 FINDINGS: Nonspecific esophageal dysmotility. Disruption of the primary peristaltic wave and occasional tertiary contractions. Left atrial enlargement with impression on the esophagus. No intrinsic lesions are identified. Pseudo strictured narrowing near the GE junction. This is likely a reflux stricture. The 13 mm barium pill would not pass through this area. IMPRESSION: Nonspecific esophageal dysmotility. No esophageal mass. Smooth distal esophageal stricture. A 13 mm barium pill would not pass through this area. No GE reflux demonstrated. Electronically Signed   By: Rudie Meyer M.D.   On: 01/18/2016 14:41    Microbiology: Recent Results (from the past 240 hour(s))  Culture, expectorated sputum-assessment     Status: None   Collection Time: 01/15/16  4:59 PM  Result Value Ref Range Status   Specimen Description SPUTUM  Final   Special Requests NONE  Final   Sputum evaluation   Final    THIS SPECIMEN IS ACCEPTABLE. RESPIRATORY CULTURE REPORT TO FOLLOW.   Report Status 01/15/2016 FINAL  Final  Culture, respiratory (NON-Expectorated)     Status: None   Collection Time: 01/15/16  4:59 PM  Result Value Ref Range Status   Specimen Description SPUTUM  Final   Special Requests NONE  Final   Gram Stain   Final    ABUNDANT WBC PRESENT, PREDOMINANTLY PMN FEW SQUAMOUS EPITHELIAL CELLS PRESENT MODERATE GRAM POSITIVE COCCI IN PAIRS IN CLUSTERS IN CHAINS FEW GRAM NEGATIVE RODS Performed at [facility]    Culture   Final    NORMAL OROPHARYNGEAL FLORA Performed at [facility]    Report Status 01/17/2016 FINAL  Final  MRSA PCR Screening     Status: None   Collection Time: 01/15/16  5:25 PM  Result Value Ref Range Status   MRSA by PCR NEGATIVE NEGATIVE Final    Comment:        The GeneXpert MRSA Assay (FDA approved for NASAL specimens only), is one component of a comprehensive MRSA colonization surveillance program. It is not intended to diagnose MRSA infection nor to guide or monitor treatment for MRSA infections.      Labs: Basic Metabolic Panel:  Recent Labs Lab 01/14/16 0508 01/15/16 0523 01/16/16 0515 01/17/16 0519 01/18/16 0528  NA 133* 130* 131* 129* 129*  K 4.8 4.4 4.9 4.6 5.0  CL 96* 95* 94* 94* 94*  CO2 25 23 23 23  21*  GLUCOSE 129* 92 93 72 70  BUN 19 26* 30* 37* 36*  CREATININE 1.35* 1.67* 1.94* 2.11* 2.01*  CALCIUM 8.7* 8.9 9.1 9.0 9.2  MG  --   --  1.9  --   --  Liver Function Tests:  Recent Labs Lab 01/13/16 1724  AST 37  ALT 34  ALKPHOS 131*  BILITOT 1.0  PROT 8.1  ALBUMIN 2.9*   No  results for input(s): LIPASE, AMYLASE in the last 168 hours. No results for input(s): AMMONIA in the last 168 hours. CBC:  Recent Labs Lab 01/13/16 1724 01/18/16 0528  WBC 8.7 6.4  NEUTROABS 6.2  --   HGB 9.2* 11.0*  HCT 27.9* 32.8*  MCV 96.2 94.8  PLT 387 269   Cardiac Enzymes: No results for input(s): CKTOTAL, CKMB, CKMBINDEX, TROPONINI in the last 168 hours. BNP: BNP (last 3 results)  Recent Labs  12/16/15 0113 01/14/16 1436 01/15/16 0523  BNP 1426.5* 1245.4* 1386.8*    ProBNP (last 3 results) No results for input(s): PROBNP in the last 8760 hours.  CBG:  Recent Labs Lab 01/17/16 1221 01/17/16 1722 01/17/16 2148 01/18/16 0733 01/18/16 1130  GLUCAP 179* 126* 112* 83 167*       Signed:  Nancylee Gaines MD, PhD  [facility] 01/18/2016, 4:43 PM

## 2016-01-18 NOTE — Care Management Note (Signed)
Case Management Note  Patient Details  Name: VERENICE WESTRICH MRN: [id number] Date of Birth: 1930-07-06  Subjective/Objective:For esophagram today, then d/c SNF-CSW notified.                    Action/Plan:d/c SNF.   Expected Discharge Date:   (unknown)               Expected Discharge Plan:  Skilled Nursing Facility  In-House Referral:  Clinical Social Work  Discharge planning Services  CM Consult  Post Acute Care Choice:    Choice offered to:     DME Arranged:    DME Agency:     HH Arranged:    HH Agency:     Status of Service:  Completed, signed off  Medicare Important Message Given:  Yes Date Medicare IM Given:    Medicare IM give by:    Date Additional Medicare IM Given:    Additional Medicare Important Message give by:     If discussed at Long Length of Stay Meetings, dates discussed:    Additional Comments:  Lanier Clam, RN 01/18/2016, 12:05 PM

## 2016-01-18 NOTE — Consult Note (Signed)
Name: Theresa Cortez MRN: [id number] DOB: 15-Jun-1930    ADMISSION DATE:  01/13/2016 CONSULTATION DATE:  5/5  REFERRING MD :  Roda Shutters  CHIEF COMPLAINT:  Cough and possible ILD  BRIEF PATIENT DESCRIPTION:  This is a 82 year old female w/ significant h/o Rheumatoid arthritis (previously on MTX), CKD stage III-IV, systolic biventricular HF w/ EF 25-30%, and AF. Has had a challenging hx really since march 2017 when she was newly diagnosed w/ HF at that time. She eventually was d/c'd then subsequently re-admitted in April 2017 w/ over-diuresis +/- sepsis d/t Influenza B. She was again d/c'd to SNF: at time of DC was on 40 mg bid. She had initially been doing well from a rehab stand-point; but about a week prior to re-admission on 5/4 she had her dose of lasix decreased to daily. She has been admitted to the hospital. Treatment to date has included: IV lasix she is now almost 2 liters negative, she had began to feel better w/ this. PCCM was asked to see the pt as a CT chest was obtained raising concern for underlying ILD and also pt continued to c/o cough.   SIGNIFICANT EVENTS    STUDIES:  CT chest 5/4: bibasilar BTX, patchy UL ground glass changes. Subpleural reticulation, subpleural lines, traction bronchiectasis and possible early honeycombing   HISTORY OF PRESENT ILLNESS:  See above   PAST MEDICAL HISTORY :   has a past medical history of Hypertension; Wound infection (HCC); Hypothyroidism; History of diverticulosis; Hyperlipidemia; Type II diabetes mellitus (HCC); History of blood transfusion (X 2); Stroke (HCC) (2000); Rheumatoid arthritis(714.0); DJD (degenerative joint disease); Arthritis; CKD (chronic kidney disease), stage III; Chronic combined systolic and diastolic heart failure (HCC); and Atrial fibrillation (HCC).  has past surgical history that includes Knee arthroscopy (Right); Total knee arthroplasty (Right, 08/07/2011; 2014); Partial colectomy; Appendectomy; Medial partial knee  replacement (Right); Incision and drainage mouth (Right, 2014); Tubal ligation; Cataract extraction w/ intraocular lens implant (Right); and Cardiac catheterization (N/A, 11/25/2015). Prior to Admission medications   Medication Sig Start Date End Date Taking? Authorizing Provider  acetaminophen (TYLENOL) 325 MG tablet Take 650 mg by mouth every 4 (four) hours as needed for moderate pain.   Yes Historical Provider, MD  albuterol (PROVENTIL) (2.5 MG/3ML) 0.083% nebulizer solution Take 2.5 mg by nebulization every 4 (four) hours as needed for wheezing or shortness of breath.   Yes Historical Provider, MD  apixaban (ELIQUIS) 2.5 MG TABS tablet Take 1 tablet (2.5 mg total) by mouth 2 (two) times daily. 11/30/15  Yes Leroy Sea, MD  dextromethorphan 7.5 MG/5ML SYRP Take 15 mg by mouth every 6 (six) hours as needed (cough).   Yes Historical Provider, MD  diclofenac sodium (VOLTAREN) 1 % GEL Apply 4 g topically 4 (four) times daily.   Yes Historical Provider, MD  docusate sodium (COLACE) 100 MG capsule Take 2 capsules (200 mg total) by mouth 2 (two) times daily. 11/30/15  Yes Leroy Sea, MD  folic acid (FOLVITE) 1 MG tablet Take 1 mg by mouth daily.   Yes Historical Provider, MD  furosemide (LASIX) 40 MG tablet Take 0.5 tablets (20 mg total) by mouth daily. Patient taking differently: Take 40 mg by mouth daily.  12/21/15  Yes Luke K Kilroy, PA-C  HYDROcodone-homatropine (HYCODAN) 5-1.5 MG/5ML syrup Take 5 mLs by mouth at bedtime. 73ml daily at bedtime for 14 days. Repeat ultrasound on 01/18/16.   Yes Historical Provider, MD  hydroxychloroquine (PLAQUENIL) 200 MG tablet Take 200  mg by mouth 2 (two) times daily.   Yes Historical Provider, MD  insulin aspart (NOVOLOG) 100 UNIT/ML injection Inject 0-11 Units into the skin 2 (two) times daily before a meal. Check glucose at 7:30am and 4:30pm.  60-100: 0 units 101-150: 2 units 151-200: 3 units 201-250: 5 units 251-300: 7 units 301-350: 9 units >350: 11  units   Yes Historical Provider, MD  latanoprost (XALATAN) 0.005 % ophthalmic solution Place 1 drop into both eyes at bedtime.   Yes Historical Provider, MD  levothyroxine (SYNTHROID, LEVOTHROID) 88 MCG tablet Take 88 mcg by mouth daily before breakfast.   Yes Historical Provider, MD  loratadine (CLARITIN) 10 MG tablet Take 10 mg by mouth daily.   Yes Historical Provider, MD  polyethylene glycol (MIRALAX / GLYCOLAX) packet Take 17 g by mouth daily. 12/20/15  Yes Calvert Cantor, MD   Allergies  Allergen Reactions  . Penicillins Swelling    Has patient had a PCN reaction causing immediate rash, facial/tongue/throat swelling, SOB or lightheadedness with hypotension: unknown Has patient had a PCN reaction causing severe rash involving mucus membranes or skin necrosis: unknown Has patient had a PCN reaction that required hospitalization: unknown Has patient had a PCN reaction occurring within the last 10 years: unknown If all of the above answers are "NO", then may proceed with Cephalosporin use.     FAMILY HISTORY:  family history includes Hypertension in her mother. SOCIAL HISTORY:  reports that she has never smoked. She has quit using smokeless tobacco. Her smokeless tobacco use included Snuff. She reports that she does not drink alcohol or use illicit drugs.  REVIEW OF SYSTEMS:   Constitutional: Negative for fever, chills, weight loss, malaise, + fatigue and diaphoresis.  HENT: Negative for hearing loss, ear pain, nosebleeds, congestion, sore throat, neck pain, tinnitus and ear discharge.   Eyes: Negative for blurred vision, double vision, photophobia, pain, discharge and redness.  Respiratory: Non-productive cough-->had been persistent, now and chronically more at night, No hemoptysis, No sputum production, shortness of breath has improved, No wheezing and stridor.   Cardiovascular: Negative for chest pain, palpitations, orthopnea, claudication, leg swelling-->has improved and no PND.    Gastrointestinal: Negative for heartburn, nausea, vomiting, abdominal pain, diarrhea, constipation, blood in stool and melena. +belching -->significant enough for her to report  Genitourinary: Negative for dysuria, urgency, frequency, hematuria and flank pain.  Musculoskeletal: Negative for myalgias, back pain, joint pain and falls.  Skin: Negative for itching and rash.  Neurological: Negative for dizziness, tingling, tremors, sensory change, speech change, focal weakness, seizures, loss of consciousness, weakness and headaches.  Endo/Heme/Allergies: Negative for environmental allergies and polydipsia. Does not bruise/bleed easily.  SUBJECTIVE:  No Distress.  VITAL SIGNS: Temp:  [97.8 F (36.6 C)-98.6 F (37 C)] 97.8 F (36.6 C) (05/09 0522) Pulse Rate:  [82-86] 84 (05/09 0522) Resp:  [18] 18 (05/09 0522) BP: (102-114)/(60-68) 114/68 mmHg (05/09 0522) SpO2:  [98 %-100 %] 100 % (05/09 0522) Weight:  [171 lb 15.3 oz (78 kg)] 171 lb 15.3 oz (78 kg) (05/09 0522)  PHYSICAL EXAMINATION: General:  Elderly 82 year old aaf, sitting up in bed. She is deconditioned; but NOT in distress.  Neuro:  Awake, oriented and w/out any focal def  HEENT:  NCAT no JVD. MMM Cardiovascular:  RRR; no MRG Lungs:  Equal, not labored, bibasilar posterior rales w/out accessory use Abdomen:  Soft, not-tender + bowel sounds  Musculoskeletal:  Equal st and bulk  Skin:  Warm, dry LE pretibial edema improving  Recent Labs Lab 01/16/16 0515 01/17/16 0519 01/18/16 0528  NA 131* 129* 129*  K 4.9 4.6 5.0  CL 94* 94* 94*  CO2 23 23 21*  BUN 30* 37* 36*  CREATININE 1.94* 2.11* 2.01*  GLUCOSE 93 72 70    Recent Labs Lab 01/13/16 1724 01/18/16 0528  HGB 9.2* 11.0*  HCT 27.9* 32.8*  WBC 8.7 6.4  PLT 387 269   Ct Chest Wo Contrast  01/17/2016  CLINICAL DATA:  Worsening cough for several days. Recent diagnosis of pneumonia. Inpatient. EXAM: CT CHEST WITHOUT CONTRAST TECHNIQUE: Multidetector CT imaging of  the chest was performed following the standard protocol without IV contrast. COMPARISON:  No prior chest CT. 01/13/2016 chest radiograph. 12/16/2015 CT abdomen/pelvis. FINDINGS: Mediastinum/Nodes: Mild cardiomegaly. No significant pericardial fluid/thickening. Left anterior descending coronary atherosclerosis. Atherosclerotic nonaneurysmal thoracic aorta. Normal caliber pulmonary arteries. Normal visualized thyroid. Normal esophagus. No pathologically enlarged axillary, mediastinal or gross hilar lymph nodes, noting limited sensitivity for the detection of hilar adenopathy on this noncontrast study. Lungs/Pleura: No pneumothorax. Small right and trace left layering pleural effusions. There are prominent patchy regions of subpleural reticulation, subpleural lines and traction bronchiectasis throughout both lungs, basilar predominant. There are areas of possible early honeycombing in the right lower lobe (series 5/images 55, 66 and 79). These findings were present at the lung bases on the 12/16/2015 CT abdomen study, with possible progression of the suspected peripheral focus of early honeycombing since 12/16/2015. Patchy mild ground-glass opacity at the periphery of the apical upper lobes. Otherwise no acute consolidative airspace disease, significant solid pulmonary nodules or lung masses. Upper abdomen: Unremarkable. Musculoskeletal: No aggressive appearing focal osseous lesions. Mild-to-moderate degenerative changes in the thoracic spine. IMPRESSION: 1. Spectrum of findings suggestive of interstitial lung disease, including prominent patchy basilar-predominant regions of subpleural reticulation, subpleural lines, traction bronchiectasis and possible early honeycombing. Findings were present at the lung bases on 12/16/2015 CT abdomen study, with possible progression of these findings since 12/16/2015. 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 Pulmonology consultation advised. A high-resolution chest CT study in 6 months is recommended to assess for temporal pattern stability. 2. Cardiomegaly and small right and trace left layering pleural effusions. Mild patchy ground-glass opacity in the apical upper lobes could indicate a component of mild superimposed pulmonary edema. 3. One vessel coronary atherosclerosis. Electronically Signed   By: Delbert Phenix M.D.   On: 01/17/2016 13:42    ASSESSMENT / PLAN: Cough Acute on Chronic Heart failure Mitral regurg CKD Pulmonary edema Probable ILD of uncertain etiology: RA? Chronic aspiration.  GERD H/o RA H/o AF   Discussion Cough in the setting of patchy bilateral pulmonary infiltrates. Superimposed on underlying BTX and possibly underlying ILD. She has a h/o Rheumatoid so some of the CT changes may be explained by this. Also had been on MTX and amiodarone; although would favor infiltrates as being explained by Pulmonary Edema. Her cough has improved some since admit so again this could be pulmonary edema related; although it continues to be present at night. In the setting of belching/ reflux disease raises concern for chronic aspiration especially w/ the basilar bronchiectatic changes.   Plan -Will need close f/u of daily weight and diuretic regimen. Suspect that the majority of her symptom burden was this.  -will order esophagram to to look for reflux, nocturnal aspiration could be a nice explanation for her night-time cough.  -no role for steroids -  little more to add from a pulmonary eval stand-point as doubt that her underlying ILD changes are the etiology of her symptoms.  -no significant benefit to be had by further evaluation or treatment of her ILD given her age and co-morbids.   Simonne Martinet ACNP-BC [facility] Pulmonary/Critical Care Pager # [phone] OR # [phone] if no answer    01/18/2016, 10:58 AM

## 2016-01-18 NOTE — Progress Notes (Signed)
IV and telemetry removed pending discharge, report called to Logan Bores at [facility] SN[facility] [phone])

## 2016-01-18 NOTE — Progress Notes (Signed)
Patient is set to discharge back to [facility] today. Patient & daughter, Misty Stanley made aware. Discharge packet given to RN, Cala Bradford. [facility] called for transport.     Lincoln Maxin, LCSW [facility] Clinical Social Worker cell #: [phone]

## 2016-01-18 NOTE — Progress Notes (Signed)
Subjective: No specific complaints  Objective: Vital signs in last 24 hours: Temp:  [97.8 F (36.6 C)-98.6 F (37 C)] 97.8 F (36.6 C) (05/09 0522) Pulse Rate:  [82-86] 84 (05/09 0522) Resp:  [18] 18 (05/09 0522) BP: (102-114)/(60-68) 114/68 mmHg (05/09 0522) SpO2:  [98 %-100 %] 100 % (05/09 0522) Weight:  [171 lb 15.3 oz (78 kg)] 171 lb 15.3 oz (78 kg) (05/09 0522) Last BM Date: 01/16/16  Intake/Output from previous day: 05/08 0701 - 05/09 0700 In: 380 [P.O.:380] Out: 950 [Urine:950] Intake/Output this shift:    Medications Scheduled Meds: . antiseptic oral rinse  7 mL Mouth Rinse q12n4p  . apixaban  2.5 mg Oral BID  . chlorhexidine  15 mL Mouth Rinse BID  . digoxin  0.0625 mg Oral Daily  . docusate sodium  200 mg Oral BID  . doxycycline  100 mg Oral Q12H  . famotidine  20 mg Oral QHS  . folic acid  1 mg Oral Daily  . furosemide  40 mg Oral Daily  . hydroxychloroquine  200 mg Oral BID  . insulin aspart  0-9 Units Subcutaneous TID WC  . latanoprost  1 drop Both Eyes QHS  . levothyroxine  88 mcg Oral QAC breakfast  . sodium chloride flush  3 mL Intravenous Q12H  . sodium chloride flush  3 mL Intravenous Q12H   Continuous Infusions:  PRN Meds:.sodium chloride, acetaminophen **OR** acetaminophen, benzonatate, bisacodyl, dextromethorphan, HYDROcodone-acetaminophen, ondansetron **OR** ondansetron (ZOFRAN) IV, polyethylene glycol, sodium chloride flush  PE: General appearance: alert, cooperative and no distress Neck: Pulsitile JVD Lungs: Mild basilar crackles Heart: regular rate and rhythm and 2/6 sys MM in axillae Extremities: No LEE Pulses: 2+ and symmetric Skin: Warm and dry Neurologic: Grossly normal  Lab Results:   Recent Labs  01/18/16 0528  WBC 6.4  HGB 11.0*  HCT 32.8*  PLT 269   BMET  Recent Labs  01/16/16 0515 01/17/16 0519 01/18/16 0528  NA 131* 129* 129*  K 4.9 4.6 5.0  CL 94* 94* 94*  CO2 23 23 21*  GLUCOSE 93 72 70  BUN 30* 37*  36*  CREATININE 1.94* 2.11* 2.01*  CALCIUM 9.1 9.0 9.2     Assessment/Plan   Principal Problem:   Acute on chronic combined systolic (congestive) and diastolic (congestive) heart failure (HCC) Active Problems:   Hyponatremia   Hypotension   Chronic renal insufficiency, stage IV (severe)   PAF (paroxysmal atrial fibrillation) (HCC)   Dyspnea   Cough   Cardiorenal disease   Interstitial lung disease (HCC)  1. Acute on chronic combined systolic/diastolic HF - 11/2015 echo LVEF 25-30%, mod to severe MR, mild RV dysfunction, severe TR - has not been on a BB or ACE-I secondary to continued hypotension.  - Net fluids: -0.6L/-2.0L . she received lasix 60mg  IV x 1 on 5/6, Significant uptrend in Cr and BUN. 1.94>>2.11 today.  -JVD elevated. Weight going up. She has severe TR on echo in addition to mod to sev MR.  - lasix 40mg  oral daily restarted yesterday. From notes she has had prior labile Cr ranging 1.3-2.3 with known CKD.  -Will need to recheck BMET 3-4 days after DC -We discussed daily weight monitoring and low sodium diet.  -SCr decreased slightly today  - I think she is euvolemic.  Continue current dose of lasix  2. PAF - This patients CHA2DS2-VASc Score and unadjusted Ischemic Stroke Rate (% per year) is equal to 7.2 % stroke rate/year from a score  of 5 (CHF, HTN, DM, Age (2). Continue Elqiuis for anticoagulation (renally adjusted dose).  - maintaining NSR thus far this admission. Has not tolerated beta blockers due to hypotension, started on low dose digoxin recently and tolerating.  -BP Stable  3. Stage 3 CKD - Cr has ranged from 1.3 - 2.3 over the past few months - uptrend in Cr with diuresis. Lasix 40mg  PO started yesterday.   4. Mitral regurgitation - moderate to severe by echo  5. Cough - ongoing cough, possible pneumonia on CXR. Defer consideration for abx to primary team.   When she is [facility], it will be to SNF/rehab.  LOS: 5 days    HAGER, BRYAN  PA-C 01/18/2016 8:19 AM  History and all data above reviewed.  Patient examined.  I agree with the findings as above.  The patient exam reveals COR:  Regular  ,  Lungs: Decreased breath sounds with crackles at the bases  ,  Abd: Positive bowel sounds, no rebound no guarding, Ext Mild edema  .  All available labs, radiology testing, previous records reviewed. Agree with documented assessment and plan. Acute on chronic systolic and diastolic HF:  We cannot diurese aggressively.  Continue current low dose of diuretic and follow creat.  I had a discussion with her and her family about a 2 gm NA diet at home.     03/19/2016 Neftaly Swiss  11:45 AM  01/18/2016

## 2016-01-18 NOTE — Progress Notes (Signed)
[facility] here to transport pt via stretcher to SNF

## 2016-01-18 NOTE — Progress Notes (Signed)
Physical Therapy Treatment Patient Details Name: Theresa Cortez MRN: [id number] DOB: [birth year] Today's Date: February 13, 2016    History of Present Illness 82 yo female admitted from SNF with acute on chronic HF. Hx of HTN, DM, CHF, CVA, CKD, A fib, RA, OA.     PT Comments    Pt assisted OOB to recliner.  Pt appears anxious about mobility and reports increased weakness.    Follow Up Recommendations  SNF     Equipment Recommendations  None recommended by PT    Recommendations for Other Services       Precautions / Restrictions Precautions Precautions: Fall    Mobility  Bed Mobility Overal bed mobility: Needs Assistance Bed Mobility: Supine to Sit     Supine to sit: Mod assist     General bed mobility comments: verbal cues for technique, required assist for bringing LEs over to EOB and scooting to EOB  Transfers Overall transfer level: Needs assistance Equipment used: Rolling walker (2 wheeled) Transfers: Sit to/from [employer] Sit to Stand: Mod assist;From elevated surface Stand pivot transfers: Mod assist       General transfer comment: Assist to rise, stabilize, control descent. vebral cues for safety, technique, hand placement. tends to have LOB posteriorly upon standing however able to correct with cues, pt appears very anxious about ambulating so encouraged at least over to recliner as she also c/o weakness  Ambulation/Gait Ambulation/Gait assistance:  (pt felt unable)               Stairs            Wheelchair Mobility    Modified Rankin (Stroke Patients Only)       Balance Overall balance assessment: Needs assistance         Standing balance support: Bilateral upper extremity supported;During functional activity Standing balance-Leahy Scale: Poor Standing balance comment: posterior LOB with standing requiring external support                    Cognition Arousal/Alertness: Awake/alert Behavior During Therapy:  Anxious Overall Cognitive Status: Within Functional Limits for tasks assessed                      Exercises      General Comments        Pertinent Vitals/Pain Pain Assessment: No/denies pain    Home Living                      Prior Function            PT Goals (current goals can now be found in the care plan section) Progress towards PT goals: Progressing toward goals    Frequency  Min 3X/week    PT Plan Current plan remains appropriate    Co-evaluation             End of Session Equipment Utilized During Treatment: Gait belt Activity Tolerance: Patient limited by fatigue Patient left: in chair;with call bell/phone within reach;with chair alarm set;with family/visitor present     Time: 9211-9417 PT Time Calculation (min) (ACUTE ONLY): 11 min  Charges:  $Therapeutic Activity: 8-22 mins                    G Codes:      Harvest Stanco,KATHrine E 2016/02/13, 12:35 PM Zenovia Jarred, PT, DPT 02-13-2016 Pager: [phone]

## 2016-01-19 ENCOUNTER — Telehealth: Payer: Self-pay | Admitting: Cardiovascular Disease

## 2016-01-19 NOTE — Telephone Encounter (Signed)
Received records from [facility] for appointment on 02/02/16 with Dr Royann Shivers.  Records given to [facility] (medical records) for Dr Croitoru's schedule. lp

## 2016-01-24 ENCOUNTER — Encounter (HOSPITAL_COMMUNITY): Payer: Self-pay | Admitting: Emergency Medicine

## 2016-01-24 ENCOUNTER — Emergency Department (HOSPITAL_COMMUNITY): Payer: Medicare Other

## 2016-01-24 ENCOUNTER — Inpatient Hospital Stay (HOSPITAL_COMMUNITY)
Admission: EM | Admit: 2016-01-24 | Discharge: 2016-02-04 | DRG: 291 | Disposition: A | Discharge status: Hospice | Payer: Medicare Other | Attending: Internal Medicine | Admitting: Internal Medicine

## 2016-01-24 DIAGNOSIS — J9601 Acute respiratory failure with hypoxia: Secondary | ICD-10-CM | POA: Diagnosis present

## 2016-01-24 DIAGNOSIS — E039 Hypothyroidism, unspecified: Secondary | ICD-10-CM | POA: Diagnosis present

## 2016-01-24 DIAGNOSIS — R740 Nonspecific elevation of levels of transaminase and lactic acid dehydrogenase [LDH]: Secondary | ICD-10-CM | POA: Diagnosis present

## 2016-01-24 DIAGNOSIS — E871 Hypo-osmolality and hyponatremia: Secondary | ICD-10-CM | POA: Diagnosis present

## 2016-01-24 DIAGNOSIS — Z515 Encounter for palliative care: Secondary | ICD-10-CM | POA: Diagnosis present

## 2016-01-24 DIAGNOSIS — I5023 Acute on chronic systolic (congestive) heart failure: Secondary | ICD-10-CM | POA: Diagnosis present

## 2016-01-24 DIAGNOSIS — I2510 Atherosclerotic heart disease of native coronary artery without angina pectoris: Secondary | ICD-10-CM | POA: Diagnosis present

## 2016-01-24 DIAGNOSIS — E872 Acidosis, unspecified: Secondary | ICD-10-CM

## 2016-01-24 DIAGNOSIS — Z7189 Other specified counseling: Secondary | ICD-10-CM | POA: Diagnosis present

## 2016-01-24 DIAGNOSIS — E875 Hyperkalemia: Secondary | ICD-10-CM

## 2016-01-24 DIAGNOSIS — I428 Other cardiomyopathies: Secondary | ICD-10-CM

## 2016-01-24 DIAGNOSIS — Z87891 Personal history of nicotine dependence: Secondary | ICD-10-CM

## 2016-01-24 DIAGNOSIS — R570 Cardiogenic shock: Secondary | ICD-10-CM | POA: Diagnosis present

## 2016-01-24 DIAGNOSIS — E785 Hyperlipidemia, unspecified: Secondary | ICD-10-CM | POA: Diagnosis present

## 2016-01-24 DIAGNOSIS — N2889 Other specified disorders of kidney and ureter: Secondary | ICD-10-CM | POA: Diagnosis present

## 2016-01-24 DIAGNOSIS — I081 Rheumatic disorders of both mitral and tricuspid valves: Secondary | ICD-10-CM | POA: Diagnosis present

## 2016-01-24 DIAGNOSIS — E8809 Other disorders of plasma-protein metabolism, not elsewhere classified: Secondary | ICD-10-CM | POA: Diagnosis present

## 2016-01-24 DIAGNOSIS — Z66 Do not resuscitate: Secondary | ICD-10-CM | POA: Diagnosis present

## 2016-01-24 DIAGNOSIS — K7200 Acute and subacute hepatic failure without coma: Secondary | ICD-10-CM | POA: Diagnosis present

## 2016-01-24 DIAGNOSIS — I482 Chronic atrial fibrillation: Secondary | ICD-10-CM | POA: Diagnosis present

## 2016-01-24 DIAGNOSIS — R0602 Shortness of breath: Secondary | ICD-10-CM | POA: Diagnosis not present

## 2016-01-24 DIAGNOSIS — I130 Hypertensive heart and chronic kidney disease with heart failure and stage 1 through stage 4 chronic kidney disease, or unspecified chronic kidney disease: Secondary | ICD-10-CM | POA: Diagnosis not present

## 2016-01-24 DIAGNOSIS — E119 Type 2 diabetes mellitus without complications: Secondary | ICD-10-CM

## 2016-01-24 DIAGNOSIS — I5043 Acute on chronic combined systolic (congestive) and diastolic (congestive) heart failure: Secondary | ICD-10-CM | POA: Diagnosis present

## 2016-01-24 DIAGNOSIS — Z794 Long term (current) use of insulin: Secondary | ICD-10-CM

## 2016-01-24 DIAGNOSIS — R945 Abnormal results of liver function studies: Secondary | ICD-10-CM

## 2016-01-24 DIAGNOSIS — R1032 Left lower quadrant pain: Secondary | ICD-10-CM

## 2016-01-24 DIAGNOSIS — Z961 Presence of intraocular lens: Secondary | ICD-10-CM | POA: Diagnosis present

## 2016-01-24 DIAGNOSIS — N179 Acute kidney failure, unspecified: Secondary | ICD-10-CM | POA: Diagnosis present

## 2016-01-24 DIAGNOSIS — M069 Rheumatoid arthritis, unspecified: Secondary | ICD-10-CM | POA: Diagnosis present

## 2016-01-24 DIAGNOSIS — N184 Chronic kidney disease, stage 4 (severe): Secondary | ICD-10-CM | POA: Diagnosis present

## 2016-01-24 DIAGNOSIS — I959 Hypotension, unspecified: Secondary | ICD-10-CM | POA: Diagnosis present

## 2016-01-24 DIAGNOSIS — Z880 Allergy status to penicillin: Secondary | ICD-10-CM

## 2016-01-24 DIAGNOSIS — I1310 Hypertensive heart and chronic kidney disease without heart failure, with stage 1 through stage 4 chronic kidney disease, or unspecified chronic kidney disease: Secondary | ICD-10-CM | POA: Diagnosis present

## 2016-01-24 DIAGNOSIS — Z8673 Personal history of transient ischemic attack (TIA), and cerebral infarction without residual deficits: Secondary | ICD-10-CM

## 2016-01-24 DIAGNOSIS — J849 Interstitial pulmonary disease, unspecified: Secondary | ICD-10-CM | POA: Diagnosis present

## 2016-01-24 DIAGNOSIS — Z7901 Long term (current) use of anticoagulants: Secondary | ICD-10-CM

## 2016-01-24 DIAGNOSIS — Z79899 Other long term (current) drug therapy: Secondary | ICD-10-CM

## 2016-01-24 DIAGNOSIS — Z9841 Cataract extraction status, right eye: Secondary | ICD-10-CM

## 2016-01-24 DIAGNOSIS — I447 Left bundle-branch block, unspecified: Secondary | ICD-10-CM | POA: Diagnosis present

## 2016-01-24 DIAGNOSIS — I480 Paroxysmal atrial fibrillation: Secondary | ICD-10-CM | POA: Diagnosis present

## 2016-01-24 DIAGNOSIS — Z96651 Presence of right artificial knee joint: Secondary | ICD-10-CM | POA: Diagnosis present

## 2016-01-24 DIAGNOSIS — E1122 Type 2 diabetes mellitus with diabetic chronic kidney disease: Secondary | ICD-10-CM | POA: Diagnosis present

## 2016-01-24 DIAGNOSIS — J9600 Acute respiratory failure, unspecified whether with hypoxia or hypercapnia: Secondary | ICD-10-CM | POA: Diagnosis present

## 2016-01-24 DIAGNOSIS — R7989 Other specified abnormal findings of blood chemistry: Secondary | ICD-10-CM | POA: Diagnosis present

## 2016-01-24 DIAGNOSIS — I429 Cardiomyopathy, unspecified: Secondary | ICD-10-CM | POA: Diagnosis present

## 2016-01-24 HISTORY — DX: Interstitial pulmonary disease, unspecified: J84.9

## 2016-01-24 LAB — CBC WITH DIFFERENTIAL/PLATELET
BASOS ABS: 0 10*3/uL (ref 0.0–0.1)
BASOS PCT: 0 %
EOS ABS: 0 10*3/uL (ref 0.0–0.7)
Eosinophils Relative: 0 %
HEMATOCRIT: 37.9 % (ref 36.0–46.0)
HEMOGLOBIN: 12.8 g/dL (ref 12.0–15.0)
Lymphocytes Relative: 15 %
Lymphs Abs: 0.9 10*3/uL (ref 0.7–4.0)
MCH: 32.8 pg (ref 26.0–34.0)
MCHC: 33.8 g/dL (ref 30.0–36.0)
MCV: 97.2 fL (ref 78.0–100.0)
MONOS PCT: 9 %
Monocytes Absolute: 0.6 10*3/uL (ref 0.1–1.0)
NEUTROS ABS: 4.9 10*3/uL (ref 1.7–7.7)
NEUTROS PCT: 76 %
Platelets: 169 10*3/uL (ref 150–400)
RBC: 3.9 MIL/uL (ref 3.87–5.11)
RDW: 17.5 % — ABNORMAL HIGH (ref 11.5–15.5)
WBC: 6.4 10*3/uL (ref 4.0–10.5)

## 2016-01-24 LAB — BLOOD GAS, VENOUS
ACID-BASE DEFICIT: 11.7 mmol/L — AB (ref 0.0–2.0)
BICARBONATE: 14.2 meq/L — AB (ref 20.0–24.0)
DRAWN BY: 409391
FIO2: 1
O2 Saturation: 59.9 %
PH VEN: 7.254 (ref 7.250–7.300)
Patient temperature: 98.6
TCO2: 13.3 mmol/L (ref 0–100)
pCO2, Ven: 33.2 mmHg — ABNORMAL LOW (ref 45.0–50.0)
pO2, Ven: 41.3 mmHg (ref 31.0–45.0)

## 2016-01-24 LAB — URINALYSIS, ROUTINE W REFLEX MICROSCOPIC
Bilirubin Urine: NEGATIVE
Glucose, UA: NEGATIVE mg/dL
Hgb urine dipstick: NEGATIVE
Ketones, ur: NEGATIVE mg/dL
LEUKOCYTES UA: NEGATIVE
NITRITE: NEGATIVE
PH: 5 (ref 5.0–8.0)
Protein, ur: NEGATIVE mg/dL
SPECIFIC GRAVITY, URINE: 1.018 (ref 1.005–1.030)

## 2016-01-24 MED ORDER — SODIUM CHLORIDE 0.9 % IV BOLUS (SEPSIS)
500.0000 mL | Freq: Once | INTRAVENOUS | Status: AC
  Administered 2016-01-24: 500 mL via INTRAVENOUS

## 2016-01-24 MED ORDER — MORPHINE SULFATE (PF) 4 MG/ML IV SOLN
4.0000 mg | Freq: Once | INTRAVENOUS | Status: AC
  Administered 2016-01-24: 4 mg via INTRAVENOUS
  Filled 2016-01-24: qty 1

## 2016-01-24 MED ORDER — ALBUTEROL SULFATE (2.5 MG/3ML) 0.083% IN NEBU
5.0000 mg | INHALATION_SOLUTION | Freq: Once | RESPIRATORY_TRACT | Status: AC
  Administered 2016-01-24: 5 mg via RESPIRATORY_TRACT
  Filled 2016-01-24: qty 6

## 2016-01-24 MED ORDER — ONDANSETRON HCL 4 MG/2ML IJ SOLN
4.0000 mg | Freq: Once | INTRAMUSCULAR | Status: AC
  Administered 2016-01-24: 4 mg via INTRAVENOUS
  Filled 2016-01-24: qty 2

## 2016-01-24 NOTE — ED Notes (Signed)
Patient placed on no-re breather per provider order.

## 2016-01-24 NOTE — Progress Notes (Signed)
Unsuccessful for ABG attempts. PA notified. RN aware.

## 2016-01-24 NOTE — ED Provider Notes (Addendum)
Complains of shortness of breath and cough onset this morning. Patient also complains of left lower quadrant abdominal pain since 2:30 this afternoon. On exam patient appears uncomfortable. HEENT no facial asymmetry neck supple positive JVD lungs clear breath sounds abdomen tender at left lower quadrant, left flank Nursing was initially able to establish a peripheral IV however could not pain a second IV after the initial I became unusable. I inserted a peripheral IV into the right external jugular vein  1250 p.m. patient appears mildly somnolent however she answers questions appropriately and states she states that her breathing is not fatigued. Clinically patient is in congestive heart failure neck vein distention and markedly elevated BNP. Intravenous Lasix ordered. Her blood pressure is marginal therefore will not order nitrates. Lasix should also treat hyperkalemia and renal insufficiency is chronic. Old medical records reviewed Lactate is markedly elevated likely representing congestive heart failure. She presented similarly clear of this month. Angiocath insertion Performed by: Doug Sou  Consent: Verbal consent obtained. Risks and benefits: risks, benefits and alternatives were discussed Time out: Immediately prior to procedure a "time out" was called to verify the correct patient, procedure, equipment, support staff and site/side marked as required.  Preparation: Patient was prepped and draped in the usual sterile fashion.  Vein Location: right external jugular    Gauge: 20  Normal blood return and flush without difficulty Patient tolerance: Patient tolerated the procedure well with no immediate complications. Dr Dalbert Mayotte from critical care service called and will evaluate patient for admission Prognosis poor given markedly elevated lactate.     Doug Sou, MD 01/25/16 [id number]  Doug Sou, MD 01/25/16 [id number]

## 2016-01-24 NOTE — ED Notes (Signed)
IV attempted x 2  IV team consult ordered

## 2016-01-24 NOTE — ED Notes (Signed)
Pt brought in by [facility] with c/o left lower quadrant pain and vaginal spotting  Pt has been having normal bowel movements  Pt has had some vomiting today, mostly phlegm per nursing home staff  Pt has just completed antibiotics for a URI  Pt has hx of diverticulitis

## 2016-01-24 NOTE — ED Notes (Signed)
Catheter and U/A will be completed after pt's breathing treatment

## 2016-01-24 NOTE — ED Provider Notes (Signed)
CSN: [id number]     Arrival date & time 01/24/16  1939 History   First MD Initiated Contact with Patient 01/24/16 2000     Chief Complaint  Patient presents with  . Abdominal Pain     (Consider location/radiation/quality/duration/timing/severity/associated sxs/prior Treatment) HPI Comments: Patient with a complicated medical history including CHF (EF 25-30%), HTN, DM, CKD, Pulmonary HTN, A-fib on Eliquis, presents with daughters with complaint of left sided abdominal and flank pain that started earlier today. She hadn't felt well all morning. Since the pain started she has had nausea, vomiting but no fever. She reports several bowel movements today, non-bloody, with history of diverticulitis in the past requiring partial colectomy (2006). She cannot tell whether her pain was different after a bowel movement, whether better, worse or the same. Per daughters, there was a question of bloody urine on her pad earlier today. The patient denies dysuria or urinary frequency.   [name] presenting with abdominal pain. The history is provided by the patient and a relative. No language interpreter was used.  Abdominal Pain Pain location:  L flank and LLQ Pain quality: sharp   Duration: Since 2:30 this afternoon. Associated symptoms: diarrhea, hematuria, nausea, shortness of breath and vomiting   Associated symptoms: no chest pain, no chills, no dysuria and no fever     Past Medical History  Diagnosis Date  . Hypertension   . Wound infection (HCC)     Right knee   . Hypothyroidism   . History of diverticulosis   . Hyperlipidemia   . Type II diabetes mellitus (HCC)   . History of blood transfusion X 2    w/knee replacement  . Stroke [facility]) 2000    denies residual on 11/23/2015  . Rheumatoid arthritis(714.0)   . DJD (degenerative joint disease)   . Arthritis     "hands, arms" (11/23/2015)  . CKD (chronic kidney disease), stage III   . Chronic combined systolic and diastolic  heart failure (HCC)   . Atrial fibrillation (HCC)   . Interstitial lung disease [facility])    Past Surgical History  Procedure Laterality Date  . Knee arthroscopy Right   . Total knee arthroplasty Right 08/07/2011; 2014  . Partial colectomy      descending colon by Dr. Orson Slick  . Appendectomy    . Medial partial knee replacement Right   . Incision and drainage mouth Right 2014    "knee; took replacement out; put  spacers in"  . Tubal ligation    . Cataract extraction w/ intraocular lens implant Right   . Cardiac catheterization N/A 11/25/2015    Procedure: Right/Left Heart Cath and Coronary Angiography;  Surgeon: Iran Ouch, MD;  Location: [facility] INVASIVE CV LAB;  Service: Cardiovascular;  Laterality: N/A;   Family History  Problem Relation Age of Onset  . Hypertension Mother    Social History  Substance Use Topics  . Smoking status: Never Smoker   . Smokeless tobacco: Former Neurosurgeon    Types: Snuff  . Alcohol Use: No   OB History    No data available     Review of Systems  Constitutional: Negative for fever and chills.  HENT: Negative.   Respiratory: Positive for shortness of breath.   Cardiovascular: Negative.  Negative for chest pain.  Gastrointestinal: Positive for nausea, vomiting, abdominal pain and diarrhea. Negative for blood in stool.  Genitourinary: Positive for hematuria and flank pain. Negative for dysuria.  Musculoskeletal: Negative.  Negative for myalgias.  Skin: Negative.   Neurological: Positive for weakness.      Allergies  Penicillins  Home Medications   Prior to Admission medications   Medication Sig Start Date End Date Taking? Authorizing Provider  acetaminophen (TYLENOL) 325 MG tablet Take 2 tablets (650 mg total) by mouth every 12 (twelve) hours as needed for moderate pain. 01/18/16   Albertine Grates, MD  albuterol (PROVENTIL) (2.5 MG/3ML) 0.083% nebulizer solution Take 2.5 mg by nebulization every 4 (four) hours as needed for wheezing or shortness of  breath.    Historical Provider, MD  apixaban (ELIQUIS) 2.5 MG TABS tablet Take 1 tablet (2.5 mg total) by mouth 2 (two) times daily. 11/30/15   Leroy Sea, MD  benzonatate (TESSALON) 200 MG capsule Take 1 capsule (200 mg total) by mouth 3 (three) times daily as needed for cough. 01/18/16   Albertine Grates, MD  Digoxin 62.5 MCG TABS Take 0.0625 mg by mouth daily. 01/18/16   Albertine Grates, MD  docusate sodium (COLACE) 100 MG capsule Take 2 capsules (200 mg total) by mouth 2 (two) times daily. 11/30/15   Leroy Sea, MD  doxycycline (VIBRA-TABS) 100 MG tablet Take 1 tablet (100 mg total) by mouth every 12 (twelve) hours. 01/18/16   Albertine Grates, MD  famotidine (PEPCID) 20 MG tablet Take 1 tablet (20 mg total) by mouth at bedtime. 01/18/16   Albertine Grates, MD  folic acid (FOLVITE) 1 MG tablet Take 1 mg by mouth daily.    Historical Provider, MD  furosemide (LASIX) 40 MG tablet Take 1 tablet (40 mg total) by mouth daily. 01/18/16   Albertine Grates, MD  HYDROcodone-homatropine [facility]) 5-1.5 MG/5ML syrup Take 5 mLs by mouth at bedtime as needed for cough. 5ml daily at bedtime for 14 days. Repeat ultrasound on 01/18/16. 01/18/16   Albertine Grates, MD  hydroxychloroquine (PLAQUENIL) 200 MG tablet Take 200 mg by mouth 2 (two) times daily.    Historical Provider, MD  insulin aspart (NOVOLOG) 100 UNIT/ML injection Inject 0-11 Units into the skin 2 (two) times daily before a meal. Check glucose at 7:30am and 4:30pm.  60-100: 0 units 101-150: 2 units 151-200: 3 units 201-250: 5 units 251-300: 7 units 301-350: 9 units >350: 11 units    Historical Provider, MD  latanoprost (XALATAN) 0.005 % ophthalmic solution Place 1 drop into both eyes at bedtime.    Historical Provider, MD  levothyroxine (SYNTHROID, LEVOTHROID) 88 MCG tablet Take 88 mcg by mouth daily before breakfast.    Historical Provider, MD  loratadine (CLARITIN) 10 MG tablet Take 10 mg by mouth daily.    Historical Provider, MD  polyethylene glycol (MIRALAX / GLYCOLAX) packet Take 17 g by  mouth daily. 12/20/15   Calvert Cantor, MD   BP 115/87 mmHg  Temp(Src) 97.8 F (36.6 C) (Oral)  Resp 18  SpO2 83% Physical Exam  Constitutional: She is oriented to person, place, and time. She appears well-developed and well-nourished.  Uncomfortable appearing.  Eyes: Conjunctivae are normal.  Neck: Normal range of motion. Neck supple.  Cardiovascular: Normal rate.   Murmur heard. Pulmonary/Chest: Effort normal. She has no wheezes. She has rales.  Diffuse rales bilaterally.  Abdominal: Soft. She exhibits no mass. There is tenderness. There is no rebound and no guarding.  Obese abdomen, soft, no palpable masses. LLQ tenderness that extends to left lateral abdominal wall.   Musculoskeletal: Normal range of motion.  Neurological: She is alert and oriented to person, place, and time.  Skin: Skin is warm and  dry.  Psychiatric: She has a normal mood and affect.    ED Course  Procedures (including critical care time) Labs Review Labs Reviewed  URINE CULTURE  COMPREHENSIVE METABOLIC PANEL  CBC WITH DIFFERENTIAL/PLATELET  URINALYSIS, ROUTINE W REFLEX MICROSCOPIC (NOT AT [facility])  LACTIC ACID, PLASMA  LACTIC ACID, PLASMA  LIPASE, BLOOD   Results for orders placed or performed during the hospital encounter of 01/24/16  Comprehensive metabolic panel  Result Value Ref Range   Sodium 135 135 - 145 mmol/L   Potassium 5.7 (H) 3.5 - 5.1 mmol/L   Chloride 96 (L) 101 - 111 mmol/L   CO2 15 (L) 22 - 32 mmol/L   Glucose, Bld 86 65 - 99 mg/dL   BUN 41 (H) 6 - 20 mg/dL   Creatinine, Ser 2.09 (H) 0.44 - 1.00 mg/dL   Calcium 9.5 8.9 - 47.0 mg/dL   Total Protein 7.0 6.5 - 8.1 g/dL   Albumin 2.6 (L) 3.5 - 5.0 g/dL   AST 962 (H) 15 - 41 U/L   ALT 258 (H) 14 - 54 U/L   Alkaline Phosphatase 173 (H) 38 - 126 U/L   Total Bilirubin 1.7 (H) 0.3 - 1.2 mg/dL   GFR calc non Af Amer 27 (L) >60 mL/min   GFR calc Af Amer 31 (L) >60 mL/min   Anion gap 24 (H) 5 - 15  CBC with Differential  Result Value Ref  Range   WBC 6.4 4.0 - 10.5 K/uL   RBC 3.90 3.87 - 5.11 MIL/uL   Hemoglobin 12.8 12.0 - 15.0 g/dL   HCT 83.6 62.9 - 47.6 %   MCV 97.2 78.0 - 100.0 fL   MCH 32.8 26.0 - 34.0 pg   MCHC 33.8 30.0 - 36.0 g/dL   RDW 54.6 (H) 50.3 - 54.6 %   Platelets 169 150 - 400 K/uL   Neutrophils Relative % 76 %   Neutro Abs 4.9 1.7 - 7.7 K/uL   Lymphocytes Relative 15 %   Lymphs Abs 0.9 0.7 - 4.0 K/uL   Monocytes Relative 9 %   Monocytes Absolute 0.6 0.1 - 1.0 K/uL   Eosinophils Relative 0 %   Eosinophils Absolute 0.0 0.0 - 0.7 K/uL   Basophils Relative 0 %   Basophils Absolute 0.0 0.0 - 0.1 K/uL  Urinalysis, Routine w reflex microscopic  Result Value Ref Range   Color, Urine AMBER (A) YELLOW   APPearance CLOUDY (A) CLEAR   Specific Gravity, Urine 1.018 1.005 - 1.030   pH 5.0 5.0 - 8.0   Glucose, UA NEGATIVE NEGATIVE mg/dL   Hgb urine dipstick NEGATIVE NEGATIVE   Bilirubin Urine NEGATIVE NEGATIVE   Ketones, ur NEGATIVE NEGATIVE mg/dL   Protein, ur NEGATIVE NEGATIVE mg/dL   Nitrite NEGATIVE NEGATIVE   Leukocytes, UA NEGATIVE NEGATIVE  Lactic acid, plasma  Result Value Ref Range   Lactic Acid, Venous 12.0 (HH) 0.5 - 2.0 mmol/L  Lipase, blood  Result Value Ref Range   Lipase 30 11 - 51 U/L  Troponin I  Result Value Ref Range   Troponin I 0.03 <0.031 ng/mL  Brain natriuretic peptide  Result Value Ref Range   B Natriuretic Peptide 2194.5 (H) 0.0 - 100.0 pg/mL  Blood gas, venous  Result Value Ref Range   FIO2 1.00    Delivery systems OXYGEN MASK    pH, Ven 7.254 7.250 - 7.300   pCO2, Ven 33.2 (L) 45.0 - 50.0 mmHg   pO2, Ven 41.3 31.0 - 45.0 mmHg  Bicarbonate 14.2 (L) 20.0 - 24.0 mEq/L   TCO2 13.3 0 - 100 mmol/L   Acid-base deficit 11.7 (H) 0.0 - 2.0 mmol/L   O2 Saturation 59.9 %   Patient temperature 98.6    Collection site VEIN    Drawn by [phone]    Sample type VEIN    Ct Abdomen Pelvis Wo Contrast  01/24/2016  CLINICAL DATA:  LEFT lower quadrant pain and vaginal spotting,  normal bowel movements, vomiting today, hypertension, type II diabetes mellitus, stroke, atrial fibrillation EXAM: CT ABDOMEN AND PELVIS WITHOUT CONTRAST TECHNIQUE: Multidetector CT imaging of the abdomen and pelvis was performed following the standard protocol without IV contrast. Sagittal and coronal MPR images reconstructed from axial data set. Patient drank a small amount of dilute oral contrast but vomited. COMPARISON:  12/16/2015 FINDINGS: Bibasilar bronchiectasis and scattered atelectasis/scarring. Within limits of a nonenhanced exam no focal abnormalities of the liver, gallbladder, spleen, pancreas, or kidneys. BILATERAL thickened adrenal glands without discrete mass. Small LEFT ovarian cyst 2.7 x 2.2 cm image 56, previously 2.6 cm greatest diameter. Uterus, adnexa, bladder, and ureters otherwise unremarkable. Small amount of nonspecific free pelvic fluid. Appendix surgically absent by history. Stomach and bowel loops grossly unremarkable. No mass, adenopathy, or free air. Scattered atherosclerotic calcifications without aneurysm. Bones normal appearance. IMPRESSION: No definite acute intra-abdominal or intrapelvic abnormalities. Small LEFT ovarian cyst. Electronically Signed   By: Ulyses Southward M.D.   On: 01/24/2016 23:17   Dg Chest 2 View  01/13/2016  CLINICAL DATA:  Shortness of breath EXAM: CHEST  2 VIEW COMPARISON:  12/17/2015 chest radiograph. FINDINGS: Stable cardiomediastinal silhouette with mild cardiomegaly. No pneumothorax. No pleural effusion. New mild hazy opacities in the bilateral upper lobes. IMPRESSION: Stable mild cardiomegaly. New mild hazy opacities in the bilateral upper lobes, which are nonspecific and could represent pneumonia or mild pulmonary edema. Recommend follow-up PA and lateral post treatment chest radiographs in 4-6 weeks. Electronically Signed   By: Delbert Phenix M.D.   On: 01/13/2016 17:46   Ct Chest Wo Contrast  01/17/2016  CLINICAL DATA:  Worsening cough for several days.  Recent diagnosis of pneumonia. Inpatient. EXAM: CT CHEST WITHOUT CONTRAST TECHNIQUE: Multidetector CT imaging of the chest was performed following the standard protocol without IV contrast. COMPARISON:  No prior chest CT. 01/13/2016 chest radiograph. 12/16/2015 CT abdomen/pelvis. FINDINGS: Mediastinum/Nodes: Mild cardiomegaly. No significant pericardial fluid/thickening. Left anterior descending coronary atherosclerosis. Atherosclerotic nonaneurysmal thoracic aorta. Normal caliber pulmonary arteries. Normal visualized thyroid. Normal esophagus. No pathologically enlarged axillary, mediastinal or gross hilar lymph nodes, noting limited sensitivity for the detection of hilar adenopathy on this noncontrast study. Lungs/Pleura: No pneumothorax. Small right and trace left layering pleural effusions. There are prominent patchy regions of subpleural reticulation, subpleural lines and traction bronchiectasis throughout both lungs, basilar predominant. There are areas of possible early honeycombing in the right lower lobe (series 5/images 55, 66 and 79). These findings were present at the lung bases on the 12/16/2015 CT abdomen study, with possible progression of the suspected peripheral focus of early honeycombing since 12/16/2015. Patchy mild ground-glass opacity at the periphery of the apical upper lobes. Otherwise no acute consolidative airspace disease, significant solid pulmonary nodules or lung masses. Upper abdomen: Unremarkable. Musculoskeletal: No aggressive appearing focal osseous lesions. Mild-to-moderate degenerative changes in the thoracic spine. IMPRESSION: 1. Spectrum of findings suggestive of interstitial lung disease, including prominent patchy basilar-predominant regions of subpleural reticulation, subpleural lines, traction bronchiectasis and possible early honeycombing. Findings were present at the lung bases on 12/16/2015  CT abdomen study, with possible progression of these findings since 12/16/2015.  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 Pulmonology consultation advised. A high-resolution chest CT study in 6 months is recommended to assess for temporal pattern stability. 2. Cardiomegaly and small right and trace left layering pleural effusions. Mild patchy ground-glass opacity in the apical upper lobes could indicate a component of mild superimposed pulmonary edema. 3. One vessel coronary atherosclerosis. Electronically Signed   By: Delbert Phenix M.D.   On: 01/17/2016 13:42   Dg Esophagus  01/18/2016  CLINICAL DATA:  Dysphagia.  Cough. EXAM: ESOPHOGRAM/BARIUM SWALLOW TECHNIQUE: Single contrast examination was performed using  thin barium. FLUOROSCOPY TIME:  Radiation Exposure Index (as provided by the fluoroscopic device): 35.5 mGy If the device does not provide the exposure index: Fluoroscopy Time:  1 minutes and 50 seconds. Number of Acquired Images:  5 COMPARISON:  CT scan 01/17/2016 FINDINGS: Nonspecific esophageal dysmotility. Disruption of the primary peristaltic wave and occasional tertiary contractions. Left atrial enlargement with impression on the esophagus. No intrinsic lesions are identified. Pseudo strictured narrowing near the GE junction. This is likely a reflux stricture. The 13 mm barium pill would not pass through this area. IMPRESSION: Nonspecific esophageal dysmotility. No esophageal mass. Smooth distal esophageal stricture. A 13 mm barium pill would not pass through this area. No GE reflux demonstrated. Electronically Signed   By: Rudie Meyer M.D.   On: 01/18/2016 14:41   Dg Chest Portable 1 View  01/24/2016  CLINICAL DATA:  Hypoxia, patient states she has been weak for past 48 hours EXAM: PORTABLE CHEST 1 VIEW COMPARISON:  CT 01/17/2016, radiograph 01/13/2016 FINDINGS: Stable enlarged cardiac silhouette. There is patchy nodular densities in the lungs unchanged from  prior. No pulmonary edema. No focal consolidation or pneumothorax. IMPRESSION: No change in patchy pulmonary parenchymal opacities compared CT of 01/17/2016 Electronically Signed   By: Genevive Bi M.D.   On: 01/24/2016 21:56    Imaging Review No results found. I have personally reviewed and evaluated these images and lab results as part of my medical decision-making.   EKG Interpretation   Date/Time:  Monday Jan 24 2016 19:49:56 EDT Ventricular Rate:  124 PR Interval:    QRS Duration: 138 QT Interval:  391 QTC Calculation: 562 R Axis:   -47 Text Interpretation:  atrial fibrillation LBBB afib/flutter appears new  from previous. LBBB noted previously. Confirmed by Juleen China  MD, STEPHEN  [phone]) on 01/24/2016 8:07:00 PM     CRITICAL CARE Performed by: Elpidio Anis A   Total critical care time: 50 minutes  Critical care time was exclusive of separately billable procedures and treating other patients.  Critical care was necessary to treat or prevent imminent or life-threatening deterioration.  Critical care was time spent personally by me on the following activities: development of treatment plan with patient and/or surrogate as well as nursing, discussions with consultants, evaluation of patient's response to treatment, examination of patient, obtaining history from patient or surrogate, ordering and performing treatments and interventions, ordering and review of laboratory studies, ordering and review of radiographic studies, pulse oximetry and re-evaluation of patient's condition.  MDM   Final diagnoses:  None    1. Lactic acidosis 2. Hyperkalemia 3. Abdominal pain 4. Hypoxia   8:30 - Patient presents with daughters from [place] where she is rehabbing after hospitalizations for PNA (April), and CHF (May) for new onset abdominal pain on the  left, extending to left flank. The patient appears SOB and is hypoxic on arrival here. She does not use home O2. She denies chest  pain. She appears uncomfortable.   8:55 - Patient re-evaluated. O2 saturation, per RT, in the 90's. EKG A-fib with LBBB, initially 115 now 96. Per daughters, the patient had some mild SOB this morning resolved with HHN x 1 (Albuterol). No c/o SOB today, she appears at her respiratory baseline per family. Difficulty IV start, lab draw - IV team pending attempt.  9:35 - Re-check - pulse ox 73%, good wave form. Patient placed on NRB. She reports she "feels fine". Does not appear to be in respiratory distress. Does appear mildly somnolent.  Patient has been stable without significant change. CT abdomen shows no significant findings. She has a significant lactic acid of 12 - elevated on previous admission without definitive cause identified.   Dr. Ethelda Chick involved in care. EJ for vascular access established by him. Critical Care consulted, who agrees to see the patient in the ED. Patient currently stable.   Elpidio Anis, PA-C 01/25/16 0201  Doug Sou, MD 01/25/16 [phone]

## 2016-01-24 NOTE — ED Notes (Signed)
Patient transported to CT 

## 2016-01-25 ENCOUNTER — Inpatient Hospital Stay (HOSPITAL_COMMUNITY): Payer: Medicare Other

## 2016-01-25 DIAGNOSIS — E875 Hyperkalemia: Secondary | ICD-10-CM | POA: Diagnosis not present

## 2016-01-25 DIAGNOSIS — R740 Nonspecific elevation of levels of transaminase and lactic acid dehydrogenase [LDH]: Secondary | ICD-10-CM | POA: Diagnosis not present

## 2016-01-25 DIAGNOSIS — I5043 Acute on chronic combined systolic (congestive) and diastolic (congestive) heart failure: Secondary | ICD-10-CM | POA: Diagnosis present

## 2016-01-25 DIAGNOSIS — E871 Hypo-osmolality and hyponatremia: Secondary | ICD-10-CM | POA: Diagnosis not present

## 2016-01-25 DIAGNOSIS — Z96651 Presence of right artificial knee joint: Secondary | ICD-10-CM | POA: Diagnosis present

## 2016-01-25 DIAGNOSIS — R570 Cardiogenic shock: Secondary | ICD-10-CM

## 2016-01-25 DIAGNOSIS — Z87891 Personal history of nicotine dependence: Secondary | ICD-10-CM | POA: Diagnosis not present

## 2016-01-25 DIAGNOSIS — E1122 Type 2 diabetes mellitus with diabetic chronic kidney disease: Secondary | ICD-10-CM | POA: Diagnosis present

## 2016-01-25 DIAGNOSIS — I429 Cardiomyopathy, unspecified: Secondary | ICD-10-CM | POA: Diagnosis not present

## 2016-01-25 DIAGNOSIS — I081 Rheumatic disorders of both mitral and tricuspid valves: Secondary | ICD-10-CM | POA: Diagnosis present

## 2016-01-25 DIAGNOSIS — Z9841 Cataract extraction status, right eye: Secondary | ICD-10-CM | POA: Diagnosis not present

## 2016-01-25 DIAGNOSIS — N184 Chronic kidney disease, stage 4 (severe): Secondary | ICD-10-CM | POA: Diagnosis present

## 2016-01-25 DIAGNOSIS — Z8673 Personal history of transient ischemic attack (TIA), and cerebral infarction without residual deficits: Secondary | ICD-10-CM | POA: Diagnosis not present

## 2016-01-25 DIAGNOSIS — J9601 Acute respiratory failure with hypoxia: Secondary | ICD-10-CM | POA: Diagnosis present

## 2016-01-25 DIAGNOSIS — Z7189 Other specified counseling: Secondary | ICD-10-CM | POA: Diagnosis not present

## 2016-01-25 DIAGNOSIS — I2510 Atherosclerotic heart disease of native coronary artery without angina pectoris: Secondary | ICD-10-CM | POA: Diagnosis present

## 2016-01-25 DIAGNOSIS — R0602 Shortness of breath: Secondary | ICD-10-CM | POA: Diagnosis present

## 2016-01-25 DIAGNOSIS — J849 Interstitial pulmonary disease, unspecified: Secondary | ICD-10-CM | POA: Diagnosis present

## 2016-01-25 DIAGNOSIS — E872 Acidosis: Secondary | ICD-10-CM | POA: Diagnosis not present

## 2016-01-25 DIAGNOSIS — Z7901 Long term (current) use of anticoagulants: Secondary | ICD-10-CM | POA: Diagnosis not present

## 2016-01-25 DIAGNOSIS — E8809 Other disorders of plasma-protein metabolism, not elsewhere classified: Secondary | ICD-10-CM | POA: Diagnosis present

## 2016-01-25 DIAGNOSIS — R0600 Dyspnea, unspecified: Secondary | ICD-10-CM | POA: Diagnosis not present

## 2016-01-25 DIAGNOSIS — J9600 Acute respiratory failure, unspecified whether with hypoxia or hypercapnia: Secondary | ICD-10-CM | POA: Diagnosis not present

## 2016-01-25 DIAGNOSIS — N183 Chronic kidney disease, stage 3 (moderate): Secondary | ICD-10-CM | POA: Diagnosis not present

## 2016-01-25 DIAGNOSIS — Z79899 Other long term (current) drug therapy: Secondary | ICD-10-CM | POA: Diagnosis not present

## 2016-01-25 DIAGNOSIS — I480 Paroxysmal atrial fibrillation: Secondary | ICD-10-CM | POA: Diagnosis present

## 2016-01-25 DIAGNOSIS — E785 Hyperlipidemia, unspecified: Secondary | ICD-10-CM | POA: Diagnosis present

## 2016-01-25 DIAGNOSIS — I427 Cardiomyopathy due to drug and external agent: Secondary | ICD-10-CM

## 2016-01-25 DIAGNOSIS — R1032 Left lower quadrant pain: Secondary | ICD-10-CM | POA: Insufficient documentation

## 2016-01-25 DIAGNOSIS — K7200 Acute and subacute hepatic failure without coma: Secondary | ICD-10-CM | POA: Diagnosis present

## 2016-01-25 DIAGNOSIS — Z961 Presence of intraocular lens: Secondary | ICD-10-CM | POA: Diagnosis present

## 2016-01-25 DIAGNOSIS — Z66 Do not resuscitate: Secondary | ICD-10-CM | POA: Diagnosis present

## 2016-01-25 DIAGNOSIS — I5021 Acute systolic (congestive) heart failure: Secondary | ICD-10-CM | POA: Diagnosis not present

## 2016-01-25 DIAGNOSIS — I5023 Acute on chronic systolic (congestive) heart failure: Secondary | ICD-10-CM

## 2016-01-25 DIAGNOSIS — Z515 Encounter for palliative care: Secondary | ICD-10-CM | POA: Diagnosis not present

## 2016-01-25 DIAGNOSIS — I130 Hypertensive heart and chronic kidney disease with heart failure and stage 1 through stage 4 chronic kidney disease, or unspecified chronic kidney disease: Secondary | ICD-10-CM | POA: Diagnosis present

## 2016-01-25 DIAGNOSIS — I447 Left bundle-branch block, unspecified: Secondary | ICD-10-CM | POA: Diagnosis present

## 2016-01-25 DIAGNOSIS — Z880 Allergy status to penicillin: Secondary | ICD-10-CM | POA: Diagnosis not present

## 2016-01-25 DIAGNOSIS — I482 Chronic atrial fibrillation: Secondary | ICD-10-CM | POA: Diagnosis not present

## 2016-01-25 DIAGNOSIS — E039 Hypothyroidism, unspecified: Secondary | ICD-10-CM | POA: Diagnosis not present

## 2016-01-25 DIAGNOSIS — Z794 Long term (current) use of insulin: Secondary | ICD-10-CM | POA: Diagnosis not present

## 2016-01-25 DIAGNOSIS — N179 Acute kidney failure, unspecified: Secondary | ICD-10-CM | POA: Diagnosis present

## 2016-01-25 LAB — APTT: APTT: 35 s (ref 24–37)

## 2016-01-25 LAB — GLUCOSE, CAPILLARY
GLUCOSE-CAPILLARY: 65 mg/dL (ref 65–99)
GLUCOSE-CAPILLARY: 173 mg/dL — AB (ref 65–99)
Glucose-Capillary: 90 mg/dL (ref 65–99)
Glucose-Capillary: 202 mg/dL — ABNORMAL HIGH (ref 65–99)
Glucose-Capillary: 170 mg/dL — ABNORMAL HIGH (ref 65–99)
Glucose-Capillary: 141 mg/dL — ABNORMAL HIGH (ref 65–99)

## 2016-01-25 LAB — COMPREHENSIVE METABOLIC PANEL
ALBUMIN: 2.1 g/dL — AB (ref 3.5–5.0)
ALK PHOS: 173 U/L — AB (ref 38–126)
ALK PHOS: 143 U/L — AB (ref 38–126)
ALT: 258 U/L — ABNORMAL HIGH (ref 14–54)
ALT: 199 U/L — ABNORMAL HIGH (ref 14–54)
ANION GAP: 24 — AB (ref 5–15)
ANION GAP: 9 (ref 5–15)
AST: 126 U/L — ABNORMAL HIGH (ref 15–41)
AST: 83 U/L — ABNORMAL HIGH (ref 15–41)
Albumin: 2.6 g/dL — ABNORMAL LOW (ref 3.5–5.0)
BUN: 41 mg/dL — ABNORMAL HIGH (ref 6–20)
BUN: 48 mg/dL — ABNORMAL HIGH (ref 6–20)
CALCIUM: 9.5 mg/dL (ref 8.9–10.3)
CALCIUM: 9.1 mg/dL (ref 8.9–10.3)
CO2: 15 mmol/L — AB (ref 22–32)
CO2: 29 mmol/L (ref 22–32)
CREATININE: 1.69 mg/dL — AB (ref 0.44–1.00)
Chloride: 96 mmol/L — ABNORMAL LOW (ref 101–111)
Chloride: 96 mmol/L — ABNORMAL LOW (ref 101–111)
Creatinine, Ser: 2.09 mg/dL — ABNORMAL HIGH (ref 0.44–1.00)
GFR calc Af Amer: 24 mL/min — ABNORMAL LOW (ref >60)
GFR calc non Af Amer: 21 mL/min — ABNORMAL LOW (ref >60)
GFR, EST AFRICAN AMERICAN: 31 mL/min — AB (ref >60)
GFR, EST NON AFRICAN AMERICAN: 27 mL/min — AB (ref >60)
GLUCOSE: 182 mg/dL — AB (ref 65–99)
Glucose, Bld: 86 mg/dL (ref 65–99)
POTASSIUM: 4.8 mmol/L (ref 3.5–5.1)
Potassium: 5.7 mmol/L — ABNORMAL HIGH (ref 3.5–5.1)
SODIUM: 134 mmol/L — AB (ref 135–145)
Sodium: 135 mmol/L (ref 135–145)
TOTAL PROTEIN: 7 g/dL (ref 6.5–8.1)
Total Bilirubin: 1.7 mg/dL — ABNORMAL HIGH (ref 0.3–1.2)
Total Bilirubin: 1.7 mg/dL — ABNORMAL HIGH (ref 0.3–1.2)
Total Protein: 6.2 g/dL — ABNORMAL LOW (ref 6.5–8.1)

## 2016-01-25 LAB — LACTIC ACID, PLASMA
Lactic Acid, Venous: 12 mmol/L (ref 0.5–2.0)
Lactic Acid, Venous: 8.2 mmol/L (ref 0.5–2.0)
Lactic Acid, Venous: 2 mmol/L (ref 0.5–2.0)

## 2016-01-25 LAB — CBC
HEMATOCRIT: 30.8 % — AB (ref 36.0–46.0)
HEMATOCRIT: 30.1 % — AB (ref 36.0–46.0)
HEMOGLOBIN: 9.9 g/dL — AB (ref 12.0–15.0)
Hemoglobin: 10.4 g/dL — ABNORMAL LOW (ref 12.0–15.0)
MCH: 33.3 pg (ref 26.0–34.0)
MCH: 31.2 pg (ref 26.0–34.0)
MCHC: 33.8 g/dL (ref 30.0–36.0)
MCHC: 32.9 g/dL (ref 30.0–36.0)
MCV: 98.7 fL (ref 78.0–100.0)
MCV: 95 fL (ref 78.0–100.0)
PLATELETS: 224 10*3/uL (ref 150–400)
Platelets: 182 10*3/uL (ref 150–400)
RBC: 3.12 MIL/uL — ABNORMAL LOW (ref 3.87–5.11)
RBC: 3.17 MIL/uL — AB (ref 3.87–5.11)
RDW: 17.5 % — AB (ref 11.5–15.5)
RDW: 17 % — ABNORMAL HIGH (ref 11.5–15.5)
WBC: 11.3 10*3/uL — AB (ref 4.0–10.5)
WBC: 11.1 10*3/uL — ABNORMAL HIGH (ref 4.0–10.5)

## 2016-01-25 LAB — ECHOCARDIOGRAM COMPLETE
HEIGHTINCHES: 66 in
WEIGHTICAEL: 2765.45 [oz_av]

## 2016-01-25 LAB — BASIC METABOLIC PANEL
ANION GAP: 23 — AB (ref 5–15)
BUN: 43 mg/dL — ABNORMAL HIGH (ref 6–20)
CHLORIDE: 98 mmol/L — AB (ref 101–111)
CO2: 15 mmol/L — AB (ref 22–32)
Calcium: 9.4 mg/dL (ref 8.9–10.3)
Creatinine, Ser: 1.77 mg/dL — ABNORMAL HIGH (ref 0.44–1.00)
GFR calc non Af Amer: 25 mL/min — ABNORMAL LOW (ref >60)
GFR, EST AFRICAN AMERICAN: 29 mL/min — AB (ref >60)
Glucose, Bld: 72 mg/dL (ref 65–99)
POTASSIUM: 4.7 mmol/L (ref 3.5–5.1)
SODIUM: 136 mmol/L (ref 135–145)

## 2016-01-25 LAB — MAGNESIUM: Magnesium: 1.9 mg/dL (ref 1.7–2.4)

## 2016-01-25 LAB — PHOSPHORUS: Phosphorus: 6 mg/dL — ABNORMAL HIGH (ref 2.5–4.6)

## 2016-01-25 LAB — LIPASE, BLOOD
LIPASE: 30 U/L (ref 11–51)
LIPASE: 26 U/L (ref 11–51)

## 2016-01-25 LAB — AMYLASE: AMYLASE: 96 U/L (ref 28–100)

## 2016-01-25 LAB — CARBOXYHEMOGLOBIN
CARBOXYHEMOGLOBIN: 1.2 % (ref 0.5–1.5)
METHEMOGLOBIN: 0.9 % (ref 0.0–1.5)
O2 Saturation: 75.1 %
Total hemoglobin: 10.3 g/dL — ABNORMAL LOW (ref 12.0–16.0)

## 2016-01-25 LAB — DIGOXIN LEVEL: DIGOXIN LVL: 0.7 ng/mL — AB (ref 0.8–2.0)

## 2016-01-25 LAB — PROTIME-INR
INR: 3 — ABNORMAL HIGH (ref 0.00–1.49)
Prothrombin Time: 30.6 seconds — ABNORMAL HIGH (ref 11.6–15.2)

## 2016-01-25 LAB — TROPONIN I: Troponin I: 0.03 ng/mL (ref <0.031)

## 2016-01-25 LAB — PROCALCITONIN: PROCALCITONIN: 0.59 ng/mL

## 2016-01-25 LAB — BRAIN NATRIURETIC PEPTIDE: B NATRIURETIC PEPTIDE 5: 2194.5 pg/mL — AB (ref 0.0–100.0)

## 2016-01-25 MED ORDER — FAMOTIDINE 20 MG PO TABS
20.0000 mg | ORAL_TABLET | Freq: Every day | ORAL | Status: DC
  Administered 2016-01-25 – 2016-01-31 (×7): 20 mg via ORAL
  Filled 2016-01-25 (×7): qty 1

## 2016-01-25 MED ORDER — FUROSEMIDE 10 MG/ML IJ SOLN: 80.0000 mg | Freq: Two times a day (BID) | INTRAMUSCULAR | Status: DC

## 2016-01-25 MED ORDER — SODIUM POLYSTYRENE SULFONATE 15 GM/60ML PO SUSP: 30.0000 g | Freq: Once | ORAL | Status: DC

## 2016-01-25 MED ORDER — INSULIN ASPART 100 UNIT/ML ~~LOC~~ SOLN
0.0000 [IU] | SUBCUTANEOUS | Status: DC
  Administered 2016-01-25 (×2): 2 [IU] via SUBCUTANEOUS
  Administered 2016-01-25: 3 [IU] via SUBCUTANEOUS
  Administered 2016-01-25 – 2016-01-26 (×2): 1 [IU] via SUBCUTANEOUS
  Administered 2016-01-26 – 2016-01-27 (×2): 2 [IU] via SUBCUTANEOUS
  Administered 2016-01-27: 1 [IU] via SUBCUTANEOUS
  Administered 2016-01-27: 2 [IU] via SUBCUTANEOUS
  Administered 2016-01-27: 9 [IU] via SUBCUTANEOUS
  Administered 2016-01-28 (×3): 5 [IU] via SUBCUTANEOUS

## 2016-01-25 MED ORDER — CHLORHEXIDINE GLUCONATE 0.12 % MT SOLN
15.0000 mL | Freq: Two times a day (BID) | OROMUCOSAL | Status: DC
Start: 2016-01-25 — End: 2016-01-26
  Administered 2016-01-25 – 2016-01-26 (×3): 15 mL via OROMUCOSAL
  Filled 2016-01-25 (×2): qty 15

## 2016-01-25 MED ORDER — AMIODARONE LOAD VIA INFUSION
150.0000 mg | Freq: Once | INTRAVENOUS | Status: AC
  Administered 2016-01-25: 150 mg via INTRAVENOUS
  Filled 2016-01-25: qty 83.34

## 2016-01-25 MED ORDER — SODIUM CHLORIDE 0.9 % IV SOLN: 250.0000 mL | INTRAVENOUS | Status: DC | PRN

## 2016-01-25 MED ORDER — PANTOPRAZOLE SODIUM 40 MG IV SOLR: 40.0000 mg | Freq: Every day | INTRAVENOUS | Status: DC

## 2016-01-25 MED ORDER — FUROSEMIDE 10 MG/ML IJ SOLN
80.0000 mg | Freq: Two times a day (BID) | INTRAMUSCULAR | Status: AC
  Administered 2016-01-25 – 2016-01-26 (×4): 80 mg via INTRAVENOUS
  Filled 2016-01-25 (×5): qty 8

## 2016-01-25 MED ORDER — HEPARIN (PORCINE) IN NACL 100-0.45 UNIT/ML-% IJ SOLN
900.0000 [IU]/h | INTRAMUSCULAR | Status: DC
  Administered 2016-01-25: 900 [IU]/h via INTRAVENOUS
  Filled 2016-01-25: qty 250

## 2016-01-25 MED ORDER — ASPIRIN 81 MG PO CHEW
324.0000 mg | CHEWABLE_TABLET | ORAL | Status: AC
  Filled 2016-01-25: qty 4

## 2016-01-25 MED ORDER — ASPIRIN 300 MG RE SUPP
300.0000 mg | RECTAL | Status: AC
  Administered 2016-01-25: 300 mg via RECTAL
  Filled 2016-01-25: qty 1

## 2016-01-25 MED ORDER — SODIUM CHLORIDE 0.9% FLUSH: 3.0000 mL | INTRAVENOUS | Status: DC | PRN

## 2016-01-25 MED ORDER — SODIUM CHLORIDE 0.9% FLUSH
10.0000 mL | Freq: Two times a day (BID) | INTRAVENOUS | Status: DC
  Administered 2016-01-26 – 2016-01-28 (×2): 10 mL
  Administered 2016-01-29: 30 mL
  Administered 2016-01-30 – 2016-01-31 (×5): 10 mL

## 2016-01-25 MED ORDER — AMIODARONE HCL IN DEXTROSE 360-4.14 MG/200ML-% IV SOLN
30.0000 mg/h | INTRAVENOUS | Status: DC
  Administered 2016-01-26: 30 mg/h via INTRAVENOUS
  Filled 2016-01-25: qty 200

## 2016-01-25 MED ORDER — DEXTROSE-NACL 5-0.45 % IV SOLN
INTRAVENOUS | Status: DC
  Administered 2016-01-25 – 2016-02-01 (×2): via INTRAVENOUS

## 2016-01-25 MED ORDER — AMIODARONE HCL IN DEXTROSE 360-4.14 MG/200ML-% IV SOLN
60.0000 mg/h | INTRAVENOUS | Status: AC
  Administered 2016-01-25 (×2): 60 mg/h via INTRAVENOUS
  Filled 2016-01-25 (×2): qty 200

## 2016-01-25 MED ORDER — SODIUM CHLORIDE 0.9 % IV SOLN
250.0000 mL | INTRAVENOUS | Status: DC | PRN
  Administered 2016-01-28: 10 mL/h via INTRAVENOUS

## 2016-01-25 MED ORDER — MAGNESIUM SULFATE 2 GM/50ML IV SOLN
2.0000 g | Freq: Once | INTRAVENOUS | Status: AC
  Administered 2016-01-25: 2 g via INTRAVENOUS
  Filled 2016-01-25: qty 50

## 2016-01-25 MED ORDER — DEXTROSE 50 % IV SOLN
INTRAVENOUS | Status: AC
  Administered 2016-01-25: 50 mL via INTRAVENOUS
  Filled 2016-01-25: qty 50

## 2016-01-25 MED ORDER — DEXTROSE 50 % IV SOLN
1.0000 | Freq: Once | INTRAVENOUS | Status: AC
  Administered 2016-01-25: 50 mL via INTRAVENOUS

## 2016-01-25 MED ORDER — FUROSEMIDE 10 MG/ML IJ SOLN
80.0000 mg | Freq: Once | INTRAMUSCULAR | Status: AC
  Administered 2016-01-25: 80 mg via INTRAVENOUS
  Filled 2016-01-25: qty 8

## 2016-01-25 MED ORDER — CETYLPYRIDINIUM CHLORIDE 0.05 % MT LIQD
7.0000 mL | Freq: Two times a day (BID) | OROMUCOSAL | Status: DC
  Administered 2016-01-25 (×2): 7 mL via OROMUCOSAL

## 2016-01-25 MED ORDER — SODIUM CHLORIDE 0.9% FLUSH
10.0000 mL | INTRAVENOUS | Status: DC | PRN
  Administered 2016-02-01: 30 mL
  Administered 2016-02-02: 10 mL
  Administered 2016-02-03: 30 mL
  Filled 2016-01-25 (×3): qty 40

## 2016-01-25 MED ORDER — SODIUM CHLORIDE 0.9% FLUSH
3.0000 mL | Freq: Two times a day (BID) | INTRAVENOUS | Status: DC
  Administered 2016-01-25 – 2016-01-31 (×5): 3 mL via INTRAVENOUS

## 2016-01-25 MED ORDER — LIDOCAINE HCL 1 % IJ SOLN
INTRAMUSCULAR | Status: AC
  Administered 2016-01-25: 20 mL
  Filled 2016-01-25: qty 20

## 2016-01-25 MED ORDER — LEVOTHYROXINE SODIUM 88 MCG PO TABS
88.0000 ug | ORAL_TABLET | Freq: Every day | ORAL | Status: DC
Start: 2016-01-25 — End: 2016-02-01
  Administered 2016-01-25 – 2016-01-31 (×6): 88 ug via ORAL
  Filled 2016-01-25 (×7): qty 1

## 2016-01-25 MED ORDER — MILRINONE LACTATE IN DEXTROSE 20-5 MG/100ML-% IV SOLN
0.1250 ug/kg/min | INTRAVENOUS | Status: DC
  Administered 2016-01-25 – 2016-01-26 (×3): 0.25 ug/kg/min via INTRAVENOUS
  Administered 2016-01-27: 0.125 ug/kg/min via INTRAVENOUS
  Administered 2016-01-28 – 2016-01-31 (×5): 0.25 ug/kg/min via INTRAVENOUS
  Filled 2016-01-25 (×10): qty 100

## 2016-01-25 NOTE — Progress Notes (Signed)
ANTICOAGULATION CONSULT NOTE - Initial Consult  Pharmacy Consult for heparin  Indication: Afib   Allergies  Allergen Reactions  . Penicillins Swelling    Has patient had a PCN reaction causing immediate rash, facial/tongue/throat swelling, SOB or lightheadedness with hypotension: unknown Has patient had a PCN reaction causing severe rash involving mucus membranes or skin necrosis: unknown Has patient had a PCN reaction that required hospitalization: unknown Has patient had a PCN reaction occurring within the last 10 years: unknown If all of the above answers are "NO", then may proceed with Cephalosporin use.     Patient Measurements: Height: 5\' 6"  (167.6 cm) Weight: 172 lb 13.5 oz (78.4 kg) IBW/kg (Calculated) : 59.3  Vital Signs: Temp: 98.2 F (36.8 C) (05/16 0800) Temp Source: Axillary (05/16 0800) BP: 102/66 mmHg (05/16 1200) Pulse Rate: 117 (05/16 1100)  Labs:  Recent Labs  01/24/16 2314 01/25/16 0245 01/25/16 0248  HGB 12.8 10.4*  --   HCT 37.9 30.8*  --   PLT 169 224  --   APTT  --   --  35  LABPROT  --   --  30.6*  INR  --   --  3.00*  CREATININE 1.69*  --  1.77*  TROPONINI 0.03  --  <0.03    Estimated Creatinine Clearance: 25.4 mL/min (by C-G formula based on Cr of 1.77).   Medical History: Past Medical History  Diagnosis Date  . Hypertension   . Wound infection (HCC)     Right knee   . Hypothyroidism   . History of diverticulosis   . Hyperlipidemia   . Type II diabetes mellitus (HCC)   . History of blood transfusion X 2    w/knee replacement  . Stroke [facility]) 2000    denies residual on 11/23/2015  . Rheumatoid arthritis(714.0)   . DJD (degenerative joint disease)   . Arthritis     "hands, arms" (11/23/2015)  . CKD (chronic kidney disease), stage III   . Chronic combined systolic and diastolic heart failure (HCC)   . Atrial fibrillation (HCC)   . Interstitial lung disease [facility])     Assessment: 85yof admitted from SNF with HF exacerbation  and Afib RVR.  Start milrinone 0.25 to improve CO and amiodarone drip for Afib to improve rate. Hx PAF on apixaban PTA with last dose 5/15 am After CL or PICC line placed begin heparin drip no bolus apixaban will falsely increase the HL so will use apTT to dose heparin until the 2 correlate. H/H ok 10/30  pltc 200   Goal of Therapy:  APTT 66-103 sec Heparin level 0.3-0.7 units/ml Monitor platelets by anticoagulation protocol: Yes   Plan:   After CL or PICC line placed  Start heparin drip 900 uts/hr Draw aPTT and HL 6hr after start and daily   11/30 Pharm.D. CPP, BCPS Clinical Pharmacist [phone] 01/25/2016 12:41 PM

## 2016-01-25 NOTE — ED Notes (Signed)
Writer called main phlebotomy for blood draws.

## 2016-01-25 NOTE — Progress Notes (Signed)
eLink Physician-Brief Progress Note Patient Name: Theresa Cortez DOB: [birth year] MRN: [id number]   Date of Service  01/25/2016  HPI/Events of Note  [facility] - stable on face mask  eICU Interventions  Follow order set Correct hypoglycemia Bedside day rounder to round     Intervention Category Intermediate Interventions: Other:  Jobin Montelongo 01/25/2016, 6:00 AM

## 2016-01-25 NOTE — ED Notes (Signed)
MD at bedside. 

## 2016-01-25 NOTE — Progress Notes (Addendum)
Peripherally Inserted Central Catheter/Midline Placement  The IV Nurse has discussed with the patient and/or persons authorized to consent for the patient, the purpose of this procedure and the potential benefits and risks involved with this procedure.  The benefits include less needle sticks, lab draws from the catheter and patient may be discharged home with the catheter.  Risks include, but not limited to, infection, bleeding, blood clot (thrombus formation), and puncture of an artery; nerve damage and irregular heat beat.  Alternatives to this procedure were also discussed.  PICC/Midline Placement Documentation  PICC Triple Lumen 01/25/16 PICC Right Brachial 40 cm 0 cm (Active)  Indication for Insertion or Continuance of Line Vasoactive infusions 01/25/2016  1:00 PM  Exposed Catheter (cm) 0 cm 01/25/2016  1:00 PM  Dressing Change Due 02/01/16 01/25/2016  1:00 PM       Stacie Glaze Horton 01/25/2016, 1:56 PM   Telephone consent

## 2016-01-25 NOTE — ED Notes (Signed)
Dr Ethelda Chick attempted to obtain ABG without success

## 2016-01-25 NOTE — Consult Note (Signed)
Advanced Heart Failure Team Consult Note  Referring Physician: Dr Caren Griffins  Primary Physician: Dr August Saucer Primary Cardiologist:  Dr Jomarie Longs  Reason for Consultation:  Acute/Chronic Systolic Heart Failure   HPI:   Mrs Theresa Cortez is 82 year old with a history of HTN, HLD, Hypothyroidism, combined systolic and diastolic CHF, moderate to severe MR, PAF (on Eliquis) Stage 3 CKD, ILD, nd RA (on MTX).  Recently diagnosed with systolic heart failure. In March of this year had RHC/LHC mild obstructed CAD. NICM. She was admitted earlier this month with volume overload. Diuresed with IV lasix and transitioned to lasix 40 mg daily. D/C on digoxin. Was in Sinus Tach. No other HF meds due to hypotension. Discharged to SNF.   Earlier today she  presented to [facility] ED with increased dyspnea. Was in A fib RVR. On admit Lactic Acid 8.2, K 5.7 creatinine 1.69, Mag 1.9, Albumin 2.6, AST 126, and ALT 258. O2 sat in the ED was 73%. Placed on NRB. On admit CXR showed pulmonary edema. CT abdomen ok. Received IV lasix.   Blood cultures pending.    LHC/RHC 11/25/2015  RA 13 PA 41 PCWP 28 PVR 3.6  CO/CI 3.63/1.86   Ost 1st Diag to 1st Diag lesion, 40% stenosed.  Ost 1st Mrg lesion, 30% stenosed.  Mid Cx lesion, 40% stenosed.  ECHO 11/2015 EF 25-30% severe TR Mod to severe MR    Review of Systems: [y] = yes, [ ]  = no   General: Weight gain [ ] ; Weight loss [ ] ; Anorexia [ ] ; Fatigue [Y ]; Fever [ ] ; Chills [ ] ; Weakness [Y ]  Cardiac: Chest pain/pressure [ ] ; Resting SOB [ Y]; Exertional SOB [Y ]; [place]ienne ]; Pedal Edema [ ] ; Palpitations [ ] ; Syncope [ ] ; Presyncope [ ] ; Paroxysmal nocturnal dyspnea[ ]   Pulmonary: Cough [Y ]; Wheezing[ ] ; Hemoptysis[ ] ; Sputum [ ] ; Snoring [ ]   GI: Vomiting[ ] ; Dysphagia[ ] ; Melena[ ] ; Hematochezia [ ] ; Heartburn[ ] ; Abdominal pain [ ] ; Constipation [ ] ; Diarrhea [ ] ; BRBPR [ ]   GU: Hematuria[ ] ; Dysuria [ ] ; Nocturia[ ]   Vascular: Pain in legs with walking [ ] ; Pain in feet  with lying flat [ ] ; Non-healing sores [ ] ; Stroke [ ] ; TIA [ ] ; Slurred speech [ ] ;  Neuro: Headaches[ ] ; Vertigo[ ] ; Seizures[ ] ; Paresthesias[ ] ;Blurred vision [ ] ; Diplopia [ ] ; Vision changes [ ]   Ortho/Skin: Arthritis [ ] ; Joint pain [Y ]; Muscle pain [ ] ; Joint swelling [ ] ; Back Pain [ ] ; Rash [ ]   Psych: Depression[ ] ; Anxiety[ ]   Heme: Bleeding problems [ ] ; Clotting disorders [ ] ; Anemia [ ]   Endocrine: Diabetes [ ] ; Thyroid dysfunction[Y ]  Home Medications Prior to Admission medications   Medication Sig Start Date End Date Taking? Authorizing Provider  acetaminophen (TYLENOL) 325 MG tablet Take 2 tablets (650 mg total) by mouth every 12 (twelve) hours as needed for moderate pain. 01/18/16  Yes Albertine Grates, MD  albuterol (PROVENTIL) (2.5 MG/3ML) 0.083% nebulizer solution Take 2.5 mg by nebulization every 4 (four) hours as needed for wheezing or shortness of breath.   Yes Historical Provider, MD  apixaban (ELIQUIS) 2.5 MG TABS tablet Take 1 tablet (2.5 mg total) by mouth 2 (two) times daily. 11/30/15  Yes Leroy Sea, MD  benzonatate (TESSALON) 200 MG capsule Take 1 capsule (200 mg total) by mouth 3 (three) times daily as needed for cough. 01/18/16  Yes Albertine Grates, MD  Digoxin 62.5 MCG  TABS Take 0.0625 mg by mouth daily. 01/18/16  Yes Albertine Grates, MD  docusate sodium (COLACE) 100 MG capsule Take 2 capsules (200 mg total) by mouth 2 (two) times daily. Patient taking differently: Take 100 mg by mouth daily.  11/30/15  Yes Leroy Sea, MD  doxycycline (VIBRA-TABS) 100 MG tablet Take 1 tablet (100 mg total) by mouth every 12 (twelve) hours. 01/18/16  Yes Albertine Grates, MD  famotidine (PEPCID) 20 MG tablet Take 1 tablet (20 mg total) by mouth at bedtime. 01/18/16  Yes Albertine Grates, MD  folic acid (FOLVITE) 1 MG tablet Take 1 mg by mouth daily.   Yes Historical Provider, MD  furosemide (LASIX) 40 MG tablet Take 1 tablet (40 mg total) by mouth daily. 01/18/16  Yes Albertine Grates, MD  HYDROcodone-homatropine [facility]) 5-1.5  MG/5ML syrup Take 5 mLs by mouth at bedtime as needed for cough. 56ml daily at bedtime for 14 days. Repeat ultrasound on 01/18/16. 01/18/16  Yes Albertine Grates, MD  hydroxychloroquine (PLAQUENIL) 200 MG tablet Take 200 mg by mouth 2 (two) times daily.   Yes Historical Provider, MD  insulin aspart (NOVOLOG) 100 UNIT/ML injection Inject 0-11 Units into the skin 2 (two) times daily before a meal. Check glucose at 7:30am and 4:30pm.  60-100: 0 units; 101-150: 2 units; 151-200: 3 units; 201-250: 5 units; 251-300: 7 units; 301-350: 9 units; >350: 11 units   Yes Historical Provider, MD  latanoprost (XALATAN) 0.005 % ophthalmic solution Place 1 drop into both eyes at bedtime.   Yes Historical Provider, MD  levothyroxine (SYNTHROID, LEVOTHROID) 88 MCG tablet Take 88 mcg by mouth daily before breakfast.   Yes Historical Provider, MD  loratadine (CLARITIN) 10 MG tablet Take 10 mg by mouth daily.   Yes Historical Provider, MD  Nutritional Supplements (NUTRITIONAL DRINK PO) Take 120 mLs by mouth 3 (three) times daily.   Yes Historical Provider, MD  polyethylene glycol (MIRALAX / GLYCOLAX) packet Take 17 g by mouth daily. Patient not taking: Reported on 01/24/2016 12/20/15   Calvert Cantor, MD    Past Medical History: Past Medical History  Diagnosis Date  . Hypertension   . Wound infection (HCC)     Right knee   . Hypothyroidism   . History of diverticulosis   . Hyperlipidemia   . Type II diabetes mellitus (HCC)   . History of blood transfusion X 2    w/knee replacement  . Stroke [facility]) 2000    denies residual on 11/23/2015  . Rheumatoid arthritis(714.0)   . DJD (degenerative joint disease)   . Arthritis     "hands, arms" (11/23/2015)  . CKD (chronic kidney disease), stage III   . Chronic combined systolic and diastolic heart failure (HCC)   . Atrial fibrillation (HCC)   . Interstitial lung disease [facility])     Past Surgical History: Past Surgical History  Procedure Laterality Date  . Knee arthroscopy Right   .  Total knee arthroplasty Right 08/07/2011; 2014  . Partial colectomy      descending colon by Dr. Orson Slick  . Appendectomy    . Medial partial knee replacement Right   . Incision and drainage mouth Right 2014    "knee; took replacement out; put  spacers in"  . Tubal ligation    . Cataract extraction w/ intraocular lens implant Right   . Cardiac catheterization N/A 11/25/2015    Procedure: Right/Left Heart Cath and Coronary Angiography;  Surgeon: Iran Ouch, MD;  Location: [facility] INVASIVE CV LAB;  Service: Cardiovascular;  Laterality: N/A;    Family History: Family History  Problem Relation Age of Onset  . Hypertension Mother     Social History: Social History   Social History  . Marital Status: Widowed    Spouse Name: N/A  . Number of Children: N/A  . Years of Education: N/A   Social History Main Topics  . Smoking status: Never Smoker   . Smokeless tobacco: Former Neurosurgeon    Types: Snuff  . Alcohol Use: No  . Drug Use: No  . Sexual Activity: No   Other Topics Concern  . None   Social History Narrative    Allergies:  Allergies  Allergen Reactions  . Penicillins Swelling    Has patient had a PCN reaction causing immediate rash, facial/tongue/throat swelling, SOB or lightheadedness with hypotension: unknown Has patient had a PCN reaction causing severe rash involving mucus membranes or skin necrosis: unknown Has patient had a PCN reaction that required hospitalization: unknown Has patient had a PCN reaction occurring within the last 10 years: unknown If all of the above answers are "NO", then may proceed with Cephalosporin use.     Objective:    Vital Signs:   Temp:  [97.6 F (36.4 C)-98.2 F (36.8 C)] 98.2 F (36.8 C) (05/16 0800) Pulse Rate:  [95-114] 114 (05/16 0930) Resp:  [15-33] 16 (05/16 1030) BP: (89-119)/(53-87) 89/76 mmHg (05/16 1030) SpO2:  [76 %-100 %] 100 % (05/16 0930) Weight:  [172 lb 13.5 oz (78.4 kg)] 172 lb 13.5 oz (78.4 kg) (05/16 0538)     Weight change: Filed Weights   01/25/16 0538  Weight: 172 lb 13.5 oz (78.4 kg)    Intake/Output:   Intake/Output Summary (Last 24 hours) at 01/25/16 1101 Last data filed at 01/25/16 1000  Gross per 24 hour  Intake     37 ml  Output      0 ml  Net     37 ml     Physical Exam: General:  Chronically ill appearing. Dyspneic at rest HEENT: normal Neck: supple. JVP to ear . Carotids 2+ bilat; no bruits. No lymphadenopathy or thryomegaly appreciated. Cor: PMI nondisplaced. Irregular rate & rhythm. No rubs, +S3  2/6 MR  Lungs: RLL LLL Crackles on 80% NRB Abdomen: soft, nontender, nondistended. No hepatosplenomegaly. No bruits or masses. Good bowel sounds. Extremities: no cyanosis, clubbing, rash, R and LLE edema 2 + extremities cool Neuro: alert & orientedx3, cranial nerves grossly intact. moves all 4 extremities w/o difficulty. Affect pleasant  Telemetry: A fib RVR 120s   Labs: Basic Metabolic Panel:  Recent Labs Lab 01/24/16 2314 01/25/16 0245 01/25/16 0248  NA 135  --  136  K 5.7*  --  4.7  CL 96*  --  98*  CO2 15*  --  15*  GLUCOSE 86  --  72  BUN 41*  --  43*  CREATININE 1.69*  --  1.77*  CALCIUM 9.5  --  9.4  MG  --  1.9  --   PHOS  --  6.0*  --     Liver Function Tests:  Recent Labs Lab 01/24/16 2314  AST 126*  ALT 258*  ALKPHOS 173*  BILITOT 1.7*  PROT 7.0  ALBUMIN 2.6*    Recent Labs Lab 01/24/16 2314 01/25/16 0248  LIPASE 30 26  AMYLASE  --  96   No results for input(s): AMMONIA in the last 168 hours.  CBC:  Recent Labs Lab 01/24/16 2314 01/25/16 0245  WBC 6.4 11.3*  NEUTROABS 4.9  --   HGB 12.8 10.4*  HCT 37.9 30.8*  MCV 97.2 98.7  PLT 169 224    Cardiac Enzymes:  Recent Labs Lab 01/24/16 2314 01/25/16 0248  TROPONINI 0.03 <0.03    BNP: BNP (last 3 results)  Recent Labs  01/14/16 1436 01/15/16 0523 01/24/16 2314  BNP 1245.4* 1386.8* 2194.5*    ProBNP (last 3 results) No results for input(s): PROBNP in the  last 8760 hours.   CBG:  Recent Labs Lab 01/18/16 1130 01/18/16 1645 01/25/16 0556 01/25/16 0633 01/25/16 0808  GLUCAP 167* 66 65 90 173*    Coagulation Studies:  Recent Labs  01/25/16 0248  LABPROT 30.6*  INR 3.00*    Other results: EKG:   Imaging: Ct Abdomen Pelvis Wo Contrast  01/24/2016  CLINICAL DATA:  LEFT lower quadrant pain and vaginal spotting, normal bowel movements, vomiting today, hypertension, type II diabetes mellitus, stroke, atrial fibrillation EXAM: CT ABDOMEN AND PELVIS WITHOUT CONTRAST TECHNIQUE: Multidetector CT imaging of the abdomen and pelvis was performed following the standard protocol without IV contrast. Sagittal and coronal MPR images reconstructed from axial data set. Patient drank a small amount of dilute oral contrast but vomited. COMPARISON:  12/16/2015 FINDINGS: Bibasilar bronchiectasis and scattered atelectasis/scarring. Within limits of a nonenhanced exam no focal abnormalities of the liver, gallbladder, spleen, pancreas, or kidneys. BILATERAL thickened adrenal glands without discrete mass. Small LEFT ovarian cyst 2.7 x 2.2 cm image 56, previously 2.6 cm greatest diameter. Uterus, adnexa, bladder, and ureters otherwise unremarkable. Small amount of nonspecific free pelvic fluid. Appendix surgically absent by history. Stomach and bowel loops grossly unremarkable. No mass, adenopathy, or free air. Scattered atherosclerotic calcifications without aneurysm. Bones normal appearance. IMPRESSION: No definite acute intra-abdominal or intrapelvic abnormalities. Small LEFT ovarian cyst. Electronically Signed   By: Ulyses Southward M.D.   On: 01/24/2016 23:17   Dg Chest Portable 1 View  01/24/2016  CLINICAL DATA:  Hypoxia, patient states she has been weak for past 48 hours EXAM: PORTABLE CHEST 1 VIEW COMPARISON:  CT 01/17/2016, radiograph 01/13/2016 FINDINGS: Stable enlarged cardiac silhouette. There is patchy nodular densities in the lungs unchanged from prior. No  pulmonary edema. No focal consolidation or pneumothorax. IMPRESSION: No change in patchy pulmonary parenchymal opacities compared CT of 01/17/2016 Electronically Signed   By: Genevive Bi M.D.   On: 01/24/2016 21:56      Medications:     Current Medications: . antiseptic oral rinse  7 mL Mouth Rinse q12n4p  . chlorhexidine  15 mL Mouth Rinse BID  . famotidine  20 mg Oral QHS  . furosemide  80 mg Intravenous BID  . insulin aspart  0-9 Units Subcutaneous Q4H  . levothyroxine  88 mcg Oral QAC breakfast  . sodium chloride flush  3 mL Intravenous Q12H  . sodium polystyrene  30 g Oral Once     Infusions: . dextrose 5 % and 0.45% NaCl 10 mL/hr at 01/25/16 0618      Assessment:  1. Cardiogenic Shock 2. A/C Systolic Heart Failure   --EF 05% with NICM 3. Acute A fib RVR-  4. Lactic Acidosis 5. CKD Stage IV 6. Elevated AST/ALT  7. Hypothyroidism 8. Hyperkalemia 9. Hypoalbuminemia- Albumin 2.6  10. Severe MR/TR   Plan/Discussion:   Ms  Fischler is an 83 year old admitted with increased dyspnea and volume overload. Suspect cardiogenic shock "cold and wet" with prominent S3. Hopefully AST/ALT will come down once cardiac output improved.  Central  access is being placed. Start milrinone 0.25 mcg. Check CO-OX and CVP. Place foley. Continue to diurese with 80 mg IV lasix twice daily. No bb with suspected shock physiology. Follow renal function.   A fib RVR. Start amio drip. Long term amio not best option with ILD and hypothyroidism. Prior to admit she was on eliquis. Start heparin drip.   Lactic acid elevated on admit and last admit. Blood cultures /UA pending. WBC 11.3   Overall prognosis poor. Will need palliative care for goals of care. She is not a candidate for advanced therapies or home inotropes. Will see how it goes over the next 24-48 hours.    Length of Stay: 0  Amy Clegg NP-C  01/25/2016, 11:01 AM  Advanced Heart Failure Team Pager [phone] (M-F; 7a - 4p)  Please  contact [facility] for night-coverage after hours (4p -7a ) and weekends on [email or web address]   Patient seen and examined with Tonye Becket, NP. We discussed all aspects of the encounter. I agree with the assessment and plan as stated above.   She has cardiogenic shock with prominent s3 on exam with severe MR/TR. Her functional status at baseline is very poor. Currently confused and unable to provide much history. Recent cath and echo images reviewed personally. No significant CAD.   Not candidate for advanced therapies. Will try to tune up with short course of inotropes and diuresis but suspect Palliative Care may be best option. Discussed with Dr. Isaiah Serge at bedside.   The patient is critically ill with multiple organ systems failure and requires high complexity decision making for assessment and support, frequent evaluation and titration of therapies, application of advanced monitoring technologies and extensive interpretation of multiple databases.   Critical Care Time devoted to patient care services described in this note is 45 Minutes.   Bensimhon, Daniel,MD 3:06 PM

## 2016-01-25 NOTE — H&P (Signed)
PULMONARY / CRITICAL CARE MEDICINE   Name: Theresa Cortez MRN: [id number] DOB: 06/18/1930    ADMISSION DATE:  01/24/2016 CONSULTATION DATE:  5/16  REFERRING MD:  Dr. Ethelda Chick  CHIEF COMPLAINT:  Lactic acidosis  HISTORY OF PRESENT ILLNESS:  82 yr old AAF h/o sys chf, recent dc may 9th for sys HF, low flow state, LActic 6 then, presents with sob to [facility] ER. Noted again to have lactic 12 normotension but cold and clammy. SOb started this  Am . Some abdo pain. CT without contrast without dead bowel but vomite dup contrast. PCXR c/w edema. ABG attempted to obtain but unable by EDP. Called to admit. NO fevers, No chest pain, no coughing now.   PAST MEDICAL HISTORY :  She  has a past medical history of Hypertension; Wound infection (HCC); Hypothyroidism; History of diverticulosis; Hyperlipidemia; Type II diabetes mellitus (HCC); History of blood transfusion (X 2); Stroke (HCC) (2000); Rheumatoid arthritis(714.0); DJD (degenerative joint disease); Arthritis; CKD (chronic kidney disease), stage III; Chronic combined systolic and diastolic heart failure (HCC); Atrial fibrillation (HCC); and Interstitial lung disease (HCC).  PAST SURGICAL HISTORY: She  has past surgical history that includes Knee arthroscopy (Right); Total knee arthroplasty (Right, 08/07/2011; 2014); Partial colectomy; Appendectomy; Medial partial knee replacement (Right); Incision and drainage mouth (Right, 2014); Tubal ligation; Cataract extraction w/ intraocular lens implant (Right); and Cardiac catheterization (N/A, 11/25/2015).  Allergies  Allergen Reactions  . Penicillins Swelling    Has patient had a PCN reaction causing immediate rash, facial/tongue/throat swelling, SOB or lightheadedness with hypotension: unknown Has patient had a PCN reaction causing severe rash involving mucus membranes or skin necrosis: unknown Has patient had a PCN reaction that required hospitalization: unknown Has patient had a PCN reaction occurring  within the last 10 years: unknown If all of the above answers are "NO", then may proceed with Cephalosporin use.     No current facility-administered medications on file prior to encounter.   Current Outpatient Prescriptions on File Prior to Encounter  Medication Sig  . acetaminophen (TYLENOL) 325 MG tablet Take 2 tablets (650 mg total) by mouth every 12 (twelve) hours as needed for moderate pain.  Marland Kitchen albuterol (PROVENTIL) (2.5 MG/3ML) 0.083% nebulizer solution Take 2.5 mg by nebulization every 4 (four) hours as needed for wheezing or shortness of breath.  Marland Kitchen apixaban (ELIQUIS) 2.5 MG TABS tablet Take 1 tablet (2.5 mg total) by mouth 2 (two) times daily.  . benzonatate (TESSALON) 200 MG capsule Take 1 capsule (200 mg total) by mouth 3 (three) times daily as needed for cough.  . Digoxin 62.5 MCG TABS Take 0.0625 mg by mouth daily.  Marland Kitchen docusate sodium (COLACE) 100 MG capsule Take 2 capsules (200 mg total) by mouth 2 (two) times daily. (Patient taking differently: Take 100 mg by mouth daily. )  . doxycycline (VIBRA-TABS) 100 MG tablet Take 1 tablet (100 mg total) by mouth every 12 (twelve) hours.  . famotidine (PEPCID) 20 MG tablet Take 1 tablet (20 mg total) by mouth at bedtime.  . folic acid (FOLVITE) 1 MG tablet Take 1 mg by mouth daily.  . furosemide (LASIX) 40 MG tablet Take 1 tablet (40 mg total) by mouth daily.  Marland Kitchen HYDROcodone-homatropine (HYCODAN) 5-1.5 MG/5ML syrup Take 5 mLs by mouth at bedtime as needed for cough. 32ml daily at bedtime for 14 days. Repeat ultrasound on 01/18/16.  . hydroxychloroquine (PLAQUENIL) 200 MG tablet Take 200 mg by mouth 2 (two) times daily.  . insulin aspart (NOVOLOG) 100 UNIT/ML  injection Inject 0-11 Units into the skin 2 (two) times daily before a meal. Check glucose at 7:30am and 4:30pm.  60-100: 0 units; 101-150: 2 units; 151-200: 3 units; 201-250: 5 units; 251-300: 7 units; 301-350: 9 units; >350: 11 units  . latanoprost (XALATAN) 0.005 % ophthalmic solution  Place 1 drop into both eyes at bedtime.  Marland Kitchen levothyroxine (SYNTHROID, LEVOTHROID) 88 MCG tablet Take 88 mcg by mouth daily before breakfast.  . loratadine (CLARITIN) 10 MG tablet Take 10 mg by mouth daily.  . polyethylene glycol (MIRALAX / GLYCOLAX) packet Take 17 g by mouth daily. (Patient not taking: Reported on 01/24/2016)    FAMILY HISTORY:  Her has no family status information on file.   SOCIAL HISTORY: She  reports that she has never smoked. She has quit using smokeless tobacco. Her smokeless tobacco use included Snuff. She reports that she does not drink alcohol or use illicit drugs.  REVIEW OF SYSTEMS:  Review of Systems  Constitutional: Positive for malaise/fatigue. Negative for fever, chills, weight loss and diaphoresis.  HENT: Negative for congestion, ear discharge, ear pain, hearing loss, nosebleeds and tinnitus.   Eyes: Negative for blurred vision, double vision, photophobia and pain.  Respiratory: Positive for shortness of breath. Negative for cough, hemoptysis and sputum production.   Cardiovascular: Positive for orthopnea, leg swelling and PND. Negative for chest pain, palpitations and claudication.  Gastrointestinal: Positive for abdominal pain. Negative for heartburn, nausea, vomiting, diarrhea and constipation.  Genitourinary: Negative for dysuria, urgency and frequency.  Musculoskeletal: Negative for myalgias and neck pain.  Skin: Negative for itching and rash.  Neurological: Positive for weakness. Negative for dizziness, tingling, tremors and headaches.  Endo/Heme/Allergies: Does not bruise/bleed easily.  Psychiatric/Behavioral: Negative for depression.    SUBJECTIVE:  Continues on NRB. Mildly confused  VITAL SIGNS: BP 96/61 mmHg  Pulse 116  Temp(Src) 98.1 F (36.7 C) (Axillary)  Resp 16  Ht 5\' 6"  (1.676 m)  Wt 172 lb 13.5 oz (78.4 kg)  BMI 27.91 kg/m2  SpO2 100%  HEMODYNAMICS:   VENTILATOR SETTINGS:   INTAKE / OUTPUT: I/O last 3 completed  shifts: In: 7 [I.V.:7] Out: -   PHYSICAL EXAMINATION: General:  Awake, No distress Neuro:  Moves all extremities, no focal deficits HEENT:  + JVD, no thyromegaly Cardiovascular:  s1 s2 irregular Lungs:  Clear Abdomen:  Soft, + BS, non tender, nondistended Musculoskeletal:  1+ Edema Skin:  No rash  LABS:  BMET  Recent Labs Lab 01/24/16 2314 01/25/16 0248  NA 135 136  K 5.7* 4.7  CL 96* 98*  CO2 15* 15*  BUN 41* 43*  CREATININE 1.69* 1.77*  GLUCOSE 86 72    Electrolytes  Recent Labs Lab 01/24/16 2314 01/25/16 0245 01/25/16 0248  CALCIUM 9.5  --  9.4  MG  --  1.9  --   PHOS  --  6.0*  --     CBC  Recent Labs Lab 01/24/16 2314 01/25/16 0245  WBC 6.4 11.3*  HGB 12.8 10.4*  HCT 37.9 30.8*  PLT 169 224    Coag's  Recent Labs Lab 01/25/16 0248  APTT 35  INR 3.00*    Sepsis Markers  Recent Labs Lab 01/24/16 2314 01/25/16 0248  LATICACIDVEN 12.0* 8.2*  PROCALCITON  --  0.59    ABG No results for input(s): PHART, PCO2ART, PO2ART in the last 168 hours.  Liver Enzymes  Recent Labs Lab 01/24/16 2314  AST 126*  ALT 258*  ALKPHOS 173*  BILITOT 1.7*  ALBUMIN  2.6*    Cardiac Enzymes  Recent Labs Lab 01/24/16 2314 01/25/16 0248  TROPONINI 0.03 <0.03    Glucose  Recent Labs Lab 01/18/16 1645 01/25/16 0556 01/25/16 0633 01/25/16 0808  GLUCAP 66 65 90 173*    Imaging Ct Abdomen Pelvis Wo Contrast  01/24/2016  CLINICAL DATA:  LEFT lower quadrant pain and vaginal spotting, normal bowel movements, vomiting today, hypertension, type II diabetes mellitus, stroke, atrial fibrillation EXAM: CT ABDOMEN AND PELVIS WITHOUT CONTRAST TECHNIQUE: Multidetector CT imaging of the abdomen and pelvis was performed following the standard protocol without IV contrast. Sagittal and coronal MPR images reconstructed from axial data set. Patient drank a small amount of dilute oral contrast but vomited. COMPARISON:  12/16/2015 FINDINGS: Bibasilar  bronchiectasis and scattered atelectasis/scarring. Within limits of a nonenhanced exam no focal abnormalities of the liver, gallbladder, spleen, pancreas, or kidneys. BILATERAL thickened adrenal glands without discrete mass. Small LEFT ovarian cyst 2.7 x 2.2 cm image 56, previously 2.6 cm greatest diameter. Uterus, adnexa, bladder, and ureters otherwise unremarkable. Small amount of nonspecific free pelvic fluid. Appendix surgically absent by history. Stomach and bowel loops grossly unremarkable. No mass, adenopathy, or free air. Scattered atherosclerotic calcifications without aneurysm. Bones normal appearance. IMPRESSION: No definite acute intra-abdominal or intrapelvic abnormalities. Small LEFT ovarian cyst. Electronically Signed   By: Ulyses Southward M.D.   On: 01/24/2016 23:17   Dg Chest Portable 1 View  01/24/2016  CLINICAL DATA:  Hypoxia, patient states she has been weak for past 48 hours EXAM: PORTABLE CHEST 1 VIEW COMPARISON:  CT 01/17/2016, radiograph 01/13/2016 FINDINGS: Stable enlarged cardiac silhouette. There is patchy nodular densities in the lungs unchanged from prior. No pulmonary edema. No focal consolidation or pneumothorax. IMPRESSION: No change in patchy pulmonary parenchymal opacities compared CT of 01/17/2016 Electronically Signed   By: Genevive Bi M.D.   On: 01/24/2016 21:56     STUDIES:    CULTURES: BC 5/16>>>  ANTIBIOTICS: None  SIGNIFICANT EVENTS: 5/16- cardiogenic shock presentation, lactic acidosis  LINES/TUBES:   ASSESSMENT / PLAN:  PULMONARY A: Pulm edema P:   Lasix Follow CXR   CARDIOVASCULAR A:  Acute sys HF, PA htn, cardiogenic shock presentation,r/o new valvular dysfxn  P:  HF team consulted Lasix for diuresis Will place central line. Follow CVP, ScVo2 Start milrinone Continue amio Follow lactic acid, troponin Check Dig levels  RENAL A:   CKD 3, mild hyperkalemia P:   Follow K Follow urine output and Cr.   GASTROINTESTINAL A:    No evidence ischemic bowel, elevated LFTs likley from shock liver P:   Keep NPO Follow LFTs  HEMATOLOGIC A:   DVT prevention aFib on eliquis P:  Hold eliquis Start heparin drip after line placement.   INFECTIOUS A:   No evidence infection, lactic acidosis, NO sepsis noted P:   Ok to observe off abx Follow WBC trend and temp  ENDOCRINE A:   DM, hypothyroidism P:   SSI Synthroid  NEUROLOGIC A:   Mild pain P:   RASS goal: 0 PRN fentanyl for pain   FAMILY  - Updates: to pt and daughters x 2 on 5/16. No family at bedside on 5/16 - Inter-disciplinary family meet or Palliative Care meeting due by: 5/23  Additional critical care time- 30 mins  Chilton Greathouse MD [place] [facility] Pager [phone] If no answer or after 3pm call: [phone] 01/25/2016, 1:38 PM

## 2016-01-25 NOTE — H&P (Addendum)
PULMONARY / CRITICAL CARE MEDICINE   Name: Theresa Cortez MRN: [id number] DOB: 06/18/1930    ADMISSION DATE:  01/24/2016 CONSULTATION DATE:  5/16  REFERRING MD:  Dr. Ethelda Chick  CHIEF COMPLAINT:  Lactic acidosis  HISTORY OF PRESENT ILLNESS:  82 yr old AAF h/o sys chf, recent dc may 9th for sys HF, low flow state, LActic 6 then, presents with sob to [facility] ER. Noted again to have lactic 12 normotension but cold and clammy. SOb started this  Am . Some abdo pain. CT without contrast without dead bowel but vomite dup contrast. PCXR c/w edema. ABG attempted to obtain but unable by EDP. Called to admit. NO fevers, No chest pain, no coughing now.   PAST MEDICAL HISTORY :  She  has a past medical history of Hypertension; Wound infection (HCC); Hypothyroidism; History of diverticulosis; Hyperlipidemia; Type II diabetes mellitus (HCC); History of blood transfusion (X 2); Stroke (HCC) (2000); Rheumatoid arthritis(714.0); DJD (degenerative joint disease); Arthritis; CKD (chronic kidney disease), stage III; Chronic combined systolic and diastolic heart failure (HCC); Atrial fibrillation (HCC); and Interstitial lung disease (HCC).  PAST SURGICAL HISTORY: She  has past surgical history that includes Knee arthroscopy (Right); Total knee arthroplasty (Right, 08/07/2011; 2014); Partial colectomy; Appendectomy; Medial partial knee replacement (Right); Incision and drainage mouth (Right, 2014); Tubal ligation; Cataract extraction w/ intraocular lens implant (Right); and Cardiac catheterization (N/A, 11/25/2015).  Allergies  Allergen Reactions  . Penicillins Swelling    Has patient had a PCN reaction causing immediate rash, facial/tongue/throat swelling, SOB or lightheadedness with hypotension: unknown Has patient had a PCN reaction causing severe rash involving mucus membranes or skin necrosis: unknown Has patient had a PCN reaction that required hospitalization: unknown Has patient had a PCN reaction occurring  within the last 10 years: unknown If all of the above answers are "NO", then may proceed with Cephalosporin use.     No current facility-administered medications on file prior to encounter.   Current Outpatient Prescriptions on File Prior to Encounter  Medication Sig  . acetaminophen (TYLENOL) 325 MG tablet Take 2 tablets (650 mg total) by mouth every 12 (twelve) hours as needed for moderate pain.  Marland Kitchen albuterol (PROVENTIL) (2.5 MG/3ML) 0.083% nebulizer solution Take 2.5 mg by nebulization every 4 (four) hours as needed for wheezing or shortness of breath.  Marland Kitchen apixaban (ELIQUIS) 2.5 MG TABS tablet Take 1 tablet (2.5 mg total) by mouth 2 (two) times daily.  . benzonatate (TESSALON) 200 MG capsule Take 1 capsule (200 mg total) by mouth 3 (three) times daily as needed for cough.  . Digoxin 62.5 MCG TABS Take 0.0625 mg by mouth daily.  Marland Kitchen docusate sodium (COLACE) 100 MG capsule Take 2 capsules (200 mg total) by mouth 2 (two) times daily. (Patient taking differently: Take 100 mg by mouth daily. )  . doxycycline (VIBRA-TABS) 100 MG tablet Take 1 tablet (100 mg total) by mouth every 12 (twelve) hours.  . famotidine (PEPCID) 20 MG tablet Take 1 tablet (20 mg total) by mouth at bedtime.  . folic acid (FOLVITE) 1 MG tablet Take 1 mg by mouth daily.  . furosemide (LASIX) 40 MG tablet Take 1 tablet (40 mg total) by mouth daily.  Marland Kitchen HYDROcodone-homatropine (HYCODAN) 5-1.5 MG/5ML syrup Take 5 mLs by mouth at bedtime as needed for cough. 32ml daily at bedtime for 14 days. Repeat ultrasound on 01/18/16.  . hydroxychloroquine (PLAQUENIL) 200 MG tablet Take 200 mg by mouth 2 (two) times daily.  . insulin aspart (NOVOLOG) 100 UNIT/ML  injection Inject 0-11 Units into the skin 2 (two) times daily before a meal. Check glucose at 7:30am and 4:30pm.  60-100: 0 units; 101-150: 2 units; 151-200: 3 units; 201-250: 5 units; 251-300: 7 units; 301-350: 9 units; >350: 11 units  . latanoprost (XALATAN) 0.005 % ophthalmic solution  Place 1 drop into both eyes at bedtime.  Marland Kitchen levothyroxine (SYNTHROID, LEVOTHROID) 88 MCG tablet Take 88 mcg by mouth daily before breakfast.  . loratadine (CLARITIN) 10 MG tablet Take 10 mg by mouth daily.  . polyethylene glycol (MIRALAX / GLYCOLAX) packet Take 17 g by mouth daily. (Patient not taking: Reported on 01/24/2016)    FAMILY HISTORY:  Her has no family status information on file.   SOCIAL HISTORY: She  reports that she has never smoked. She has quit using smokeless tobacco. Her smokeless tobacco use included Snuff. She reports that she does not drink alcohol or use illicit drugs.  REVIEW OF SYSTEMS:  Review of Systems  Constitutional: Positive for malaise/fatigue. Negative for fever, chills, weight loss and diaphoresis.  HENT: Negative for congestion, ear discharge, ear pain, hearing loss, nosebleeds and tinnitus.   Eyes: Negative for blurred vision, double vision, photophobia and pain.  Respiratory: Positive for shortness of breath. Negative for cough, hemoptysis and sputum production.   Cardiovascular: Positive for orthopnea, leg swelling and PND. Negative for chest pain, palpitations and claudication.  Gastrointestinal: Positive for abdominal pain. Negative for heartburn, nausea, vomiting, diarrhea and constipation.  Genitourinary: Negative for dysuria, urgency and frequency.  Musculoskeletal: Negative for myalgias and neck pain.  Skin: Negative for itching and rash.  Neurological: Positive for weakness. Negative for dizziness, tingling, tremors and headaches.  Endo/Heme/Allergies: Does not bruise/bleed easily.  Psychiatric/Behavioral: Negative for depression.        SUBJECTIVE:  Mild increase RR BP wnl awake  VITAL SIGNS: BP 119/72 mmHg  Pulse 111  Temp(Src) 97.8 F (36.6 C) (Oral)  Resp 20  SpO2 100%  HEMODYNAMICS:    VENTILATOR SETTINGS:    INTAKE / OUTPUT:    PHYSICAL EXAMINATION: General:  Awake, no sig distress Neuro:  nonfocal HEENT:  jvd   Cardiovascular:  s1 s2 IRR sys murm Lungs:  Crackles bases Abdomen:  Soft, Bs wnl, no r/g Musculoskeletal:  Edema 3 plus equal Skin:  No rash  LABS:  BMET  Recent Labs Lab 01/18/16 0528 01/24/16 2314  NA 129* 135  K 5.0 5.7*  CL 94* 96*  CO2 21* 15*  BUN 36* 41*  CREATININE 2.01* 1.69*  GLUCOSE 70 86    Electrolytes  Recent Labs Lab 01/18/16 0528 01/24/16 2314  CALCIUM 9.2 9.5    CBC  Recent Labs Lab 01/18/16 0528 01/24/16 2314  WBC 6.4 6.4  HGB 11.0* 12.8  HCT 32.8* 37.9  PLT 269 169    Coag's No results for input(s): APTT, INR in the last 168 hours.  Sepsis Markers  Recent Labs Lab 01/24/16 2314  LATICACIDVEN 12.0*    ABG No results for input(s): PHART, PCO2ART, PO2ART in the last 168 hours.  Liver Enzymes  Recent Labs Lab 01/24/16 2314  AST 126*  ALT 258*  ALKPHOS 173*  BILITOT 1.7*  ALBUMIN 2.6*    Cardiac Enzymes  Recent Labs Lab 01/24/16 2314  TROPONINI 0.03    Glucose  Recent Labs Lab 01/18/16 0733 01/18/16 1130 01/18/16 1645  GLUCAP 83 167* 66    Imaging Ct Abdomen Pelvis Wo Contrast  01/24/2016  CLINICAL DATA:  LEFT lower quadrant pain and vaginal spotting, normal  bowel movements, vomiting today, hypertension, type II diabetes mellitus, stroke, atrial fibrillation EXAM: CT ABDOMEN AND PELVIS WITHOUT CONTRAST TECHNIQUE: Multidetector CT imaging of the abdomen and pelvis was performed following the standard protocol without IV contrast. Sagittal and coronal MPR images reconstructed from axial data set. Patient drank a small amount of dilute oral contrast but vomited. COMPARISON:  12/16/2015 FINDINGS: Bibasilar bronchiectasis and scattered atelectasis/scarring. Within limits of a nonenhanced exam no focal abnormalities of the liver, gallbladder, spleen, pancreas, or kidneys. BILATERAL thickened adrenal glands without discrete mass. Small LEFT ovarian cyst 2.7 x 2.2 cm image 56, previously 2.6 cm greatest diameter.  Uterus, adnexa, bladder, and ureters otherwise unremarkable. Small amount of nonspecific free pelvic fluid. Appendix surgically absent by history. Stomach and bowel loops grossly unremarkable. No mass, adenopathy, or free air. Scattered atherosclerotic calcifications without aneurysm. Bones normal appearance. IMPRESSION: No definite acute intra-abdominal or intrapelvic abnormalities. Small LEFT ovarian cyst. Electronically Signed   By: Ulyses Southward M.D.   On: 01/24/2016 23:17   Dg Chest Portable 1 View  01/24/2016  CLINICAL DATA:  Hypoxia, patient states she has been weak for past 48 hours EXAM: PORTABLE CHEST 1 VIEW COMPARISON:  CT 01/17/2016, radiograph 01/13/2016 FINDINGS: Stable enlarged cardiac silhouette. There is patchy nodular densities in the lungs unchanged from prior. No pulmonary edema. No focal consolidation or pneumothorax. IMPRESSION: No change in patchy pulmonary parenchymal opacities compared CT of 01/17/2016 Electronically Signed   By: Genevive Bi M.D.   On: 01/24/2016 21:56     STUDIES:    CULTURES: BC 5/16>>>  ANTIBIOTICS:   SIGNIFICANT EVENTS: 5/16- cardiogenic shock presentation, lactic acidosis  LINES/TUBES:   ASSESSMENT / PLAN:  PULMONARY A: Pulm edema P:   Lasix pcxr in am  VBG re assuring   CARDIOVASCULAR A:  Acute sys HF, PA htn, cardiogenic shock presentation,r/o new valvular dysfxn  P:  Move to cone as able Will need CHF service consult Lasix, repeat lactic, if flat or rising, not clinically improved, then will swan, start ionotropic agent, likley  Milrinone Repeat echo for valvular fxn Trop Neg balance goals Tele Lactic repeat likely needs line and cvp, svo2 Get dig level  RENAL A:   CKD 3, mild hyperkalemia P:   Lasix Chemi in am kvo Repeat K now after lasix given  GASTROINTESTINAL A:   No evidence ischemic bowel, LFt likley from shock liver P:   CT reviewed NPO ppi See [facility]vs for improved cardiogenic support Lipase  assessment Follow LF Tin 24 hrs Support MAP  HEMATOLOGIC A:   DVt prevention Fib on elequis P:  Get coags Consider hep drip after above review May need line, holding anticoagulation for now  INFECTIOUS A:   No evidence infection, lactic acidosis, NO sepsis noted P:   BC assessment  ENDOCRINE A:   DM, hypothyroidism P:   SSI synthroid  NEUROLOGIC A:   Mild pain P:   RASS goal: 0 Prn fent consider   FAMILY  - Updates: to pt and daughters x 2  - Inter-disciplinary family meet or Palliative Care meeting due by: 5/23  Ccm time 35  Daniel J. Tyson Alias, MD, FACP Pgr: [phone] [place] [facility]  Pulmonary and Critical Care Medicine [facility] Pager: [phone]  01/25/2016, 1:51 AM

## 2016-01-25 NOTE — Progress Notes (Signed)
  Echocardiogram 2D Echocardiogram has been performed.  Cathie Beams 01/25/2016, 10:39 AM

## 2016-01-26 ENCOUNTER — Inpatient Hospital Stay (HOSPITAL_COMMUNITY): Payer: Medicare Other

## 2016-01-26 DIAGNOSIS — R570 Cardiogenic shock: Secondary | ICD-10-CM | POA: Insufficient documentation

## 2016-01-26 DIAGNOSIS — Z7189 Other specified counseling: Secondary | ICD-10-CM | POA: Diagnosis present

## 2016-01-26 DIAGNOSIS — J9600 Acute respiratory failure, unspecified whether with hypoxia or hypercapnia: Secondary | ICD-10-CM | POA: Diagnosis present

## 2016-01-26 DIAGNOSIS — I480 Paroxysmal atrial fibrillation: Secondary | ICD-10-CM

## 2016-01-26 DIAGNOSIS — Z515 Encounter for palliative care: Secondary | ICD-10-CM | POA: Diagnosis present

## 2016-01-26 DIAGNOSIS — I5023 Acute on chronic systolic (congestive) heart failure: Secondary | ICD-10-CM | POA: Diagnosis present

## 2016-01-26 DIAGNOSIS — N179 Acute kidney failure, unspecified: Secondary | ICD-10-CM

## 2016-01-26 DIAGNOSIS — I5021 Acute systolic (congestive) heart failure: Secondary | ICD-10-CM

## 2016-01-26 LAB — BASIC METABOLIC PANEL
ANION GAP: 11 (ref 5–15)
BUN: 52 mg/dL — ABNORMAL HIGH (ref 6–20)
CALCIUM: 8.8 mg/dL — AB (ref 8.9–10.3)
CO2: 28 mmol/L (ref 22–32)
CREATININE: 2.18 mg/dL — AB (ref 0.44–1.00)
Chloride: 98 mmol/L — ABNORMAL LOW (ref 101–111)
GFR calc non Af Amer: 20 mL/min — ABNORMAL LOW (ref >60)
GFR, EST AFRICAN AMERICAN: 23 mL/min — AB (ref >60)
Glucose, Bld: 106 mg/dL — ABNORMAL HIGH (ref 65–99)
Potassium: 4.1 mmol/L (ref 3.5–5.1)
SODIUM: 137 mmol/L (ref 135–145)

## 2016-01-26 LAB — PHOSPHORUS: PHOSPHORUS: 4.3 mg/dL (ref 2.5–4.6)

## 2016-01-26 LAB — MAGNESIUM: MAGNESIUM: 2.5 mg/dL — AB (ref 1.7–2.4)

## 2016-01-26 LAB — APTT
aPTT: >200 s (ref 24–37)
aPTT: 118 seconds — ABNORMAL HIGH (ref 24–37)
aPTT: 101 seconds — ABNORMAL HIGH (ref 24–37)

## 2016-01-26 LAB — CBC
HEMATOCRIT: 29 % — AB (ref 36.0–46.0)
HEMOGLOBIN: 9.6 g/dL — AB (ref 12.0–15.0)
MCH: 31 pg (ref 26.0–34.0)
MCHC: 33.1 g/dL (ref 30.0–36.0)
MCV: 93.5 fL (ref 78.0–100.0)
Platelets: 179 10*3/uL (ref 150–400)
RBC: 3.1 MIL/uL — AB (ref 3.87–5.11)
RDW: 17 % — ABNORMAL HIGH (ref 11.5–15.5)
WBC: 9.2 10*3/uL (ref 4.0–10.5)

## 2016-01-26 LAB — GLUCOSE, CAPILLARY
GLUCOSE-CAPILLARY: 133 mg/dL — AB (ref 65–99)
Glucose-Capillary: 100 mg/dL — ABNORMAL HIGH (ref 65–99)
Glucose-Capillary: 117 mg/dL — ABNORMAL HIGH (ref 65–99)
Glucose-Capillary: 130 mg/dL — ABNORMAL HIGH (ref 65–99)
Glucose-Capillary: 183 mg/dL — ABNORMAL HIGH (ref 65–99)
Glucose-Capillary: 89 mg/dL (ref 65–99)

## 2016-01-26 LAB — URINE CULTURE: Culture: NO GROWTH

## 2016-01-26 LAB — HEPARIN LEVEL (UNFRACTIONATED)
Heparin Unfractionated: 1.34 IU/mL — ABNORMAL HIGH (ref 0.30–0.70)
Heparin Unfractionated: 0.95 IU/mL — ABNORMAL HIGH (ref 0.30–0.70)

## 2016-01-26 LAB — CARBOXYHEMOGLOBIN
Carboxyhemoglobin: 1.2 % (ref 0.5–1.5)
Methemoglobin: 0.9 % (ref 0.0–1.5)
O2 Saturation: 67.2 %
TOTAL HEMOGLOBIN: 9.8 g/dL — AB (ref 12.0–16.0)

## 2016-01-26 MED ORDER — HEPARIN (PORCINE) IN NACL 100-0.45 UNIT/ML-% IJ SOLN
600.0000 [IU]/h | INTRAMUSCULAR | Status: DC
  Filled 2016-01-26: qty 250

## 2016-01-26 MED ORDER — CETYLPYRIDINIUM CHLORIDE 0.05 % MT LIQD
7.0000 mL | Freq: Two times a day (BID) | OROMUCOSAL | Status: DC
  Administered 2016-01-26 – 2016-02-04 (×16): 7 mL via OROMUCOSAL

## 2016-01-26 MED ORDER — METOLAZONE 2.5 MG PO TABS
2.5000 mg | ORAL_TABLET | Freq: Once | ORAL | Status: AC
  Administered 2016-01-26: 2.5 mg via ORAL
  Filled 2016-01-26: qty 1

## 2016-01-26 NOTE — Progress Notes (Addendum)
ANTICOAGULATION CONSULT NOTE Pharmacy Consult for heparin  Indication: Afib   Allergies  Allergen Reactions  . Penicillins Swelling    Has patient had a PCN reaction causing immediate rash, facial/tongue/throat swelling, SOB or lightheadedness with hypotension: unknown Has patient had a PCN reaction causing severe rash involving mucus membranes or skin necrosis: unknown Has patient had a PCN reaction that required hospitalization: unknown Has patient had a PCN reaction occurring within the last 10 years: unknown If all of the above answers are "NO", then may proceed with Cephalosporin use.     Patient Measurements: Height: 5\' 6"  (167.6 cm) Weight: 171 lb 1.2 oz (77.6 kg) IBW/kg (Calculated) : 59.3  Vital Signs: Temp: 97.3 F (36.3 C) (05/17 1159) Temp Source: Oral (05/17 1159) BP: 90/60 mmHg (05/17 0900) Pulse Rate: 85 (05/17 0900)  Labs:  Recent Labs  01/24/16 2314 01/25/16 0245 01/25/16 0248 01/25/16 1500 01/25/16 2200 01/26/16 0216 01/26/16 0445 01/26/16 1023 01/26/16 1200  HGB 12.8 10.4*  --  9.9*  --   --  9.6*  --   --   HCT 37.9 30.8*  --  30.1*  --   --  29.0*  --   --   PLT 169 224  --  182  --   --  179  --   --   APTT  --   --  35  --   --  >200*  --  118*  --   LABPROT  --   --  30.6*  --   --   --   --   --   --   INR  --   --  3.00*  --   --   --   --   --   --   HEPARINUNFRC  --   --   --   --  1.34*  --   --   --  0.95*  CREATININE 1.69*  --  1.77* 2.09*  --   --  2.18*  --   --   TROPONINI 0.03  --  <0.03  --   --   --   --   --   --     Estimated Creatinine Clearance: 20.6 mL/min (by C-G formula based on Cr of 2.18).   Assessment: 82 y.o. female admitted with CHF exacerbation, Eliquis PTA for Afib (last dose 5/15 AM), now on heparin. Both HL and APTT are supratherapeutic on heparin 650 units/hr, possibly due to an error in lab draw location.  Goal of Therapy:  APTT 66-102 sec Heparin level 0.3-0.7 units/ml Monitor platelets by  anticoagulation protocol: Yes   Plan:  Hold heparin and redraw level F/u APTT to re-dose heparin   6/15, PharmD Clinical Pharmacy Resident Pager # [phone] 01/26/2016 2:37 PM

## 2016-01-26 NOTE — Progress Notes (Signed)
ANTICOAGULATION CONSULT NOTE Pharmacy Consult for heparin  Indication: Afib   Allergies  Allergen Reactions  . Penicillins Swelling    Has patient had a PCN reaction causing immediate rash, facial/tongue/throat swelling, SOB or lightheadedness with hypotension: unknown Has patient had a PCN reaction causing severe rash involving mucus membranes or skin necrosis: unknown Has patient had a PCN reaction that required hospitalization: unknown Has patient had a PCN reaction occurring within the last 10 years: unknown If all of the above answers are "NO", then may proceed with Cephalosporin use.     Patient Measurements: Height: 5\' 6"  (167.6 cm) Weight: 171 lb 1.2 oz (77.6 kg) IBW/kg (Calculated) : 59.3  Vital Signs: Temp: 97.3 F (36.3 C) (05/17 1159) Temp Source: Oral (05/17 1159) BP: 104/53 mmHg (05/17 1600) Pulse Rate: 122 (05/17 1600)  Labs:  Recent Labs  01/24/16 2314 01/25/16 0245  01/25/16 0248 01/25/16 1500 01/25/16 2200 01/26/16 0216 01/26/16 0445 01/26/16 1023 01/26/16 1200 01/26/16 1520  HGB 12.8 10.4*  --   --  9.9*  --   --  9.6*  --   --   --   HCT 37.9 30.8*  --   --  30.1*  --   --  29.0*  --   --   --   PLT 169 224  --   --  182  --   --  179  --   --   --   APTT  --   --   < > 35  --   --  >200*  --  118*  --  101*  LABPROT  --   --   --  30.6*  --   --   --   --   --   --   --   INR  --   --   --  3.00*  --   --   --   --   --   --   --   HEPARINUNFRC  --   --   --   --   --  1.34*  --   --   --  0.95*  --   CREATININE 1.69*  --   --  1.77* 2.09*  --   --  2.18*  --   --   --   TROPONINI 0.03  --   --  <0.03  --   --   --   --   --   --   --   < > = values in this interval not displayed.  Estimated Creatinine Clearance: 20.6 mL/min (by C-G formula based on Cr of 2.18).   Assessment: 82 y.o. female admitted with CHF exacerbation, Eliquis PTA for Afib (last dose 5/15 AM), now on heparin. Both HL and APTT are supratherapeutic on heparin 650 units/hr  thought  possibly due to  draw location. Recheck HL still elevated 100sec so will decrease drip rate and obtain peripheral AC labs in am  Goal of Therapy:  APTT 66-102 sec Heparin level 0.3-0.7 units/ml Monitor platelets by anticoagulation protocol: Yes   Plan:  Decrease heparin 500 uts/hr Daily HL, aptt, CBC   6/15 Pharm.D. CPP, BCPS Clinical Pharmacist [phone] 01/26/2016 4:25 PM

## 2016-01-26 NOTE — Progress Notes (Signed)
Pt went into vent trigeminy rhythm, had a low BP (70's/50's) but was asymptomatic. Paged cards MD. Given orders to hold amiodarone gtt. Will continue to monitor

## 2016-01-26 NOTE — Progress Notes (Signed)
Advanced Heart Failure Rounding Note   Subjective:    Yesterday diuresed with IV lasix, started on milrinone, and amio drip. Todays CO-OX is 67%.  Sluggish urine output.   Denis SOB.     Objective:   Weight Range:  Vital Signs:   Temp:  [97 F (36.1 C)-98.4 F (36.9 C)] 97.4 F (36.3 C) (05/17 0813) Pulse Rate:  [34-125] 91 (05/17 0700) Resp:  [12-33] 13 (05/17 0700) BP: (77-105)/(40-79) 86/54 mmHg (05/17 0700) SpO2:  [92 %-100 %] 100 % (05/17 0700) FiO2 (%):  [40 %-55 %] 40 % (05/17 0310) Weight:  [171 lb 1.2 oz (77.6 kg)] 171 lb 1.2 oz (77.6 kg) (05/17 0452)    Weight change: Filed Weights   01/25/16 0538 01/26/16 0452  Weight: 172 lb 13.5 oz (78.4 kg) 171 lb 1.2 oz (77.6 kg)    Intake/Output:   Intake/Output Summary (Last 24 hours) at 01/26/16 0840 Last data filed at 01/26/16 0600  Gross per 24 hour  Intake  930.6 ml  Output    765 ml  Net  165.6 ml     Physical Exam: CVP ~9   General: Chronically ill . No resp difficulty. On 2 liters  HEENT: normal Neck: supple. JVP 9-10  Carotids 2+ bilat; no bruits. No lymphadenopathy or thryomegaly appreciated. Cor: PMI nondisplaced. Irregular rate & rhythm. No rubs, gallops or murmurs. Lungs: clear on 2 liters.  Abdomen: soft, nontender, nondistended. No hepatosplenomegaly. No bruits or masses. Good bowel sounds. Extremities: no cyanosis, clubbing, rash, R and LLE 1+ edema SCDs on RLE and LLE  Neuro: alert & orientedx3, cranial nerves grossly intact. moves all 4 extremities w/o difficulty. Affect pleasant  Telemetry: A Fib 90s   Labs: Basic Metabolic Panel:  Recent Labs Lab 01/24/16 2314 01/25/16 0245 01/25/16 0248 01/25/16 1500 01/26/16 0445  NA 135  --  136 134* 137  K 5.7*  --  4.7 4.8 4.1  CL 96*  --  98* 96* 98*  CO2 15*  --  15* 29 28  GLUCOSE 86  --  72 182* 106*  BUN 41*  --  43* 48* 52*  CREATININE 1.69*  --  1.77* 2.09* 2.18*  CALCIUM 9.5  --  9.4 9.1 8.8*  MG  --  1.9  --   --  2.5*    PHOS  --  6.0*  --   --  4.3    Liver Function Tests:  Recent Labs Lab 01/24/16 2314 01/25/16 1500  AST 126* 83*  ALT 258* 199*  ALKPHOS 173* 143*  BILITOT 1.7* 1.7*  PROT 7.0 6.2*  ALBUMIN 2.6* 2.1*    Recent Labs Lab 01/24/16 2314 01/25/16 0248  LIPASE 30 26  AMYLASE  --  96   No results for input(s): AMMONIA in the last 168 hours.  CBC:  Recent Labs Lab 01/24/16 2314 01/25/16 0245 01/25/16 1500 01/26/16 0445  WBC 6.4 11.3* 11.1* 9.2  NEUTROABS 4.9  --   --   --   HGB 12.8 10.4* 9.9* 9.6*  HCT 37.9 30.8* 30.1* 29.0*  MCV 97.2 98.7 95.0 93.5  PLT 169 224 182 179    Cardiac Enzymes:  Recent Labs Lab 01/24/16 2314 01/25/16 0248  TROPONINI 0.03 <0.03    BNP: BNP (last 3 results)  Recent Labs  01/14/16 1436 01/15/16 0523 01/24/16 2314  BNP 1245.4* 1386.8* 2194.5*    ProBNP (last 3 results) No results for input(s): PROBNP in the last 8760 hours.  Other results:  Imaging: Ct Abdomen Pelvis Wo Contrast  01/24/2016  CLINICAL DATA:  LEFT lower quadrant pain and vaginal spotting, normal bowel movements, vomiting today, hypertension, type II diabetes mellitus, stroke, atrial fibrillation EXAM: CT ABDOMEN AND PELVIS WITHOUT CONTRAST TECHNIQUE: Multidetector CT imaging of the abdomen and pelvis was performed following the standard protocol without IV contrast. Sagittal and coronal MPR images reconstructed from axial data set. Patient drank a small amount of dilute oral contrast but vomited. COMPARISON:  12/16/2015 FINDINGS: Bibasilar bronchiectasis and scattered atelectasis/scarring. Within limits of a nonenhanced exam no focal abnormalities of the liver, gallbladder, spleen, pancreas, or kidneys. BILATERAL thickened adrenal glands without discrete mass. Small LEFT ovarian cyst 2.7 x 2.2 cm image 56, previously 2.6 cm greatest diameter. Uterus, adnexa, bladder, and ureters otherwise unremarkable. Small amount of nonspecific free pelvic fluid. Appendix  surgically absent by history. Stomach and bowel loops grossly unremarkable. No mass, adenopathy, or free air. Scattered atherosclerotic calcifications without aneurysm. Bones normal appearance. IMPRESSION: No definite acute intra-abdominal or intrapelvic abnormalities. Small LEFT ovarian cyst. Electronically Signed   By: Ulyses Southward M.D.   On: 01/24/2016 23:17   Dg Chest Port 1 View  01/26/2016  CLINICAL DATA:  Respiratory failure. EXAM: PORTABLE CHEST 1 VIEW COMPARISON:  01/25/2016. FINDINGS: Cardiomegaly with normal pulmonary vascularity. Mild bibasilar infiltrates and or edema. Small bilateral pleural effusions . IMPRESSION: 1. Cardiomegaly. 2. Mild bibasilar infiltrates and/or pulmonary edema. Small bilateral pleural effusions. Congestive heart failure cannot be excluded. Electronically Signed   By: Maisie Fus  Register   On: 01/26/2016 07:28   Dg Chest Port 1 View  01/25/2016  CLINICAL DATA:  Confirm line placement. EXAM: PORTABLE CHEST 1 VIEW COMPARISON:  01/24/2016 FINDINGS: Right arm PICC line has been placed. Tip in the SVC region. Heart size is mildly enlarged. Again noted are a few scattered parenchymal densities in both lungs which are similar to the prior examination. Negative for a pneumothorax. IMPRESSION: PICC line tip in the SVC region. Patchy parenchymal lung densities are unchanged. Electronically Signed   By: Richarda Overlie M.D.   On: 01/25/2016 14:37   Dg Chest Portable 1 View  01/24/2016  CLINICAL DATA:  Hypoxia, patient states she has been weak for past 48 hours EXAM: PORTABLE CHEST 1 VIEW COMPARISON:  CT 01/17/2016, radiograph 01/13/2016 FINDINGS: Stable enlarged cardiac silhouette. There is patchy nodular densities in the lungs unchanged from prior. No pulmonary edema. No focal consolidation or pneumothorax. IMPRESSION: No change in patchy pulmonary parenchymal opacities compared CT of 01/17/2016 Electronically Signed   By: Genevive Bi M.D.   On: 01/24/2016 21:56      Medications:      Scheduled Medications: . antiseptic oral rinse  7 mL Mouth Rinse q12n4p  . chlorhexidine  15 mL Mouth Rinse BID  . famotidine  20 mg Oral QHS  . furosemide  80 mg Intravenous BID  . insulin aspart  0-9 Units Subcutaneous Q4H  . levothyroxine  88 mcg Oral QAC breakfast  . sodium chloride flush  10-40 mL Intracatheter Q12H  . sodium chloride flush  3 mL Intravenous Q12H  . sodium polystyrene  30 g Oral Once     Infusions: . amiodarone 30 mg/hr (01/26/16 0100)  . dextrose 5 % and 0.45% NaCl 10 mL/hr at 01/25/16 0618  . heparin 650 Units/hr (01/26/16 0400)  . milrinone 0.25 mcg/kg/min (01/26/16 0100)     PRN Medications:  sodium chloride, sodium chloride, sodium chloride flush, sodium chloride flush   Assessment:  1.  Cardiogenic Shock 2. A/C Systolic Heart Failure  --EF 35% with NICM 3. Acute A fib RVR-  4. Lactic Acidosis 5. CKD Stage IV 6. Elevated AST/ALT  7. Hypothyroidism 8. Hyperkalemia 9. Hypoalbuminemia- Albumin 2.6  10. Severe MR/TR   Plan/Discussion:   Sluggish diuresis noted. Continue IV lasix 80 mg twice a day + milrinone 0.25 mcg. Add 2.5 mg metolazone. Renal function trending up. No BB . BP soft no hydralazine/imdur.   Needs Palliative Care for goals of care.   Length of Stay: 1   Amy Clegg NP-C  01/26/2016, 8:40 AM  Advanced Heart Failure Team Pager [phone] (M-F; 7a - 4p)  Please contact [facility] for night-coverage after hours (4p -7a ) and weekends on [email or web address]  Patient seen and examined with Tonye Becket, NP. We discussed all aspects of the encounter. I agree with the assessment and plan as stated above.   Shock improvin on milrinone. Feels better. Co-ox up. Diuresing slowly. That said, I think she will decompensate again once milrinone is stopped.  Will try to start low dose Bidil as tolerated by BP but BP currently very low. Remains on amio for AF but AF remains fast. Was in NSR on previous admit so could consider TEE/DC-cV but  not sure it will change outcome. Long talk with her daughter about the fact that if she is unable to tolerate molrinone wean that hopice may be the best option.   The patient is critically ill with multiple organ systems failure and requires high complexity decision making for assessment and support, frequent evaluation and titration of therapies, application of advanced monitoring technologies and extensive interpretation of multiple databases.   Critical Care Time devoted to patient care services described in this note is 35 Minutes.  Bensimhon, Daniel,MD 10:44 PM

## 2016-01-26 NOTE — Progress Notes (Signed)
ANTICOAGULATION CONSULT NOTE Pharmacy Consult for heparin  Indication: Afib   Allergies  Allergen Reactions  . Penicillins Swelling    Has patient had a PCN reaction causing immediate rash, facial/tongue/throat swelling, SOB or lightheadedness with hypotension: unknown Has patient had a PCN reaction causing severe rash involving mucus membranes or skin necrosis: unknown Has patient had a PCN reaction that required hospitalization: unknown Has patient had a PCN reaction occurring within the last 10 years: unknown If all of the above answers are "NO", then may proceed with Cephalosporin use.     Patient Measurements: Height: 5\' 6"  (167.6 cm) Weight: 172 lb 13.5 oz (78.4 kg) IBW/kg (Calculated) : 59.3  Vital Signs: Temp: 97.6 F (36.4 C) (05/16 2356) Temp Source: Oral (05/16 2356) BP: 84/42 mmHg (05/16 2356) Pulse Rate: 104 (05/16 2356)  Labs:  Recent Labs  01/24/16 2314 01/25/16 0245 01/25/16 0248 01/25/16 1500 01/25/16 2200 01/26/16 0216  HGB 12.8 10.4*  --  9.9*  --   --   HCT 37.9 30.8*  --  30.1*  --   --   PLT 169 224  --  182  --   --   APTT  --   --  35  --   --  >200*  LABPROT  --   --  30.6*  --   --   --   INR  --   --  3.00*  --   --   --   HEPARINUNFRC  --   --   --   --  1.34*  --   CREATININE 1.69*  --  1.77* 2.09*  --   --   TROPONINI 0.03  --  <0.03  --   --   --     Estimated Creatinine Clearance: 21.5 mL/min (by C-G formula based on Cr of 2.09).   Assessment: 82 y.o. female admitted with CHF exacerbation, h/o Afib and Eliquis on hold, for heparin  Goal of Therapy:  APTT 66-102 sec Heparin level 0.3-0.7 units/ml Monitor platelets by anticoagulation protocol: Yes   Plan:  Hold heparin x 45 min, then decrease heparin 650 units/hr Check heparin level in 8 hours.   91, PharmD, BCPS  01/26/2016 3:16 AM

## 2016-01-26 NOTE — Progress Notes (Signed)
PULMONARY / CRITICAL CARE MEDICINE   Name: Theresa Cortez MRN: [id number] DOB: 1929/12/23    ADMISSION DATE:  01/24/2016 CONSULTATION DATE:  5/16  REFERRING MD: Dr. Ethelda Chick  CHIEF COMPLAINT: Lactic acidosis  HISTORY OF PRESENT ILLNESS: 82 yr old AAF h/o sys chf, recent dc may 9th for sys HF, low flow state, LActic 6 then, presents with sob to [facility] ER. Noted again to have lactic 12 normotension but cold and clammy. SOb started this Am . Some abdo pain. CT without contrast without dead bowel but vomite dup contrast. PCXR c/w edema. ABG attempted to obtain but unable by EDP. Called to admit. NO fevers, No chest pain, no coughing now.   PAST MEDICAL HISTORY :  She  has a past medical history of Hypertension; Wound infection (HCC); Hypothyroidism; History of diverticulosis; Hyperlipidemia; Type II diabetes mellitus (HCC); History of blood transfusion (X 2); Stroke (HCC) (2000); Rheumatoid arthritis(714.0); DJD (degenerative joint disease); Arthritis; CKD (chronic kidney disease), stage III; Chronic combined systolic and diastolic heart failure (HCC); Atrial fibrillation (HCC); and Interstitial lung disease (HCC).  PAST SURGICAL HISTORY: She  has past surgical history that includes Knee arthroscopy (Right); Total knee arthroplasty (Right, 08/07/2011; 2014); Partial colectomy; Appendectomy; Medial partial knee replacement (Right); Incision and drainage mouth (Right, 2014); Tubal ligation; Cataract extraction w/ intraocular lens implant (Right); and Cardiac catheterization (N/A, 11/25/2015).   Allergies  Allergen Reactions  . Penicillins Swelling    Has patient had a PCN reaction causing immediate rash, facial/tongue/throat swelling, SOB or lightheadedness with hypotension: unknown Has patient had a PCN reaction causing severe rash involving mucus membranes or skin necrosis: unknown Has patient had a PCN reaction that required hospitalization: unknown Has patient had a PCN reaction occurring  within the last 10 years: unknown If all of the above answers are "NO", then may proceed with Cephalosporin use.     No current facility-administered medications on file prior to encounter.   Current Outpatient Prescriptions on File Prior to Encounter  Medication Sig  . acetaminophen (TYLENOL) 325 MG tablet Take 2 tablets (650 mg total) by mouth every 12 (twelve) hours as needed for moderate pain.  Marland Kitchen albuterol (PROVENTIL) (2.5 MG/3ML) 0.083% nebulizer solution Take 2.5 mg by nebulization every 4 (four) hours as needed for wheezing or shortness of breath.  Marland Kitchen apixaban (ELIQUIS) 2.5 MG TABS tablet Take 1 tablet (2.5 mg total) by mouth 2 (two) times daily.  . benzonatate (TESSALON) 200 MG capsule Take 1 capsule (200 mg total) by mouth 3 (three) times daily as needed for cough.  . Digoxin 62.5 MCG TABS Take 0.0625 mg by mouth daily.  Marland Kitchen docusate sodium (COLACE) 100 MG capsule Take 2 capsules (200 mg total) by mouth 2 (two) times daily. (Patient taking differently: Take 100 mg by mouth daily. )  . doxycycline (VIBRA-TABS) 100 MG tablet Take 1 tablet (100 mg total) by mouth every 12 (twelve) hours.  . famotidine (PEPCID) 20 MG tablet Take 1 tablet (20 mg total) by mouth at bedtime.  . folic acid (FOLVITE) 1 MG tablet Take 1 mg by mouth daily.  . furosemide (LASIX) 40 MG tablet Take 1 tablet (40 mg total) by mouth daily.  Marland Kitchen HYDROcodone-homatropine (HYCODAN) 5-1.5 MG/5ML syrup Take 5 mLs by mouth at bedtime as needed for cough. 50ml daily at bedtime for 14 days. Repeat ultrasound on 01/18/16.  . hydroxychloroquine (PLAQUENIL) 200 MG tablet Take 200 mg by mouth 2 (two) times daily.  . insulin aspart (NOVOLOG) 100 UNIT/ML injection Inject 0-11  Units into the skin 2 (two) times daily before a meal. Check glucose at 7:30am and 4:30pm.  60-100: 0 units; 101-150: 2 units; 151-200: 3 units; 201-250: 5 units; 251-300: 7 units; 301-350: 9 units; >350: 11 units  . latanoprost (XALATAN) 0.005 % ophthalmic solution  Place 1 drop into both eyes at bedtime.  Marland Kitchen levothyroxine (SYNTHROID, LEVOTHROID) 88 MCG tablet Take 88 mcg by mouth daily before breakfast.  . loratadine (CLARITIN) 10 MG tablet Take 10 mg by mouth daily.  . polyethylene glycol (MIRALAX / GLYCOLAX) packet Take 17 g by mouth daily. (Patient not taking: Reported on 01/24/2016)    FAMILY HISTORY:  Her has no family status information on file.   SOCIAL HISTORY: She  reports that she has never smoked. She has quit using smokeless tobacco. Her smokeless tobacco use included Snuff. She reports that she does not drink alcohol or use illicit drugs.  REVIEW OF SYSTEMS:   Denies any dyspnea, chest pain, palpitation All other ROS are negative.  SUBJECTIVE:   VITAL SIGNS: BP 90/60 mmHg  Pulse 85  Temp(Src) 97.3 F (36.3 C) (Oral)  Resp 15  Ht 5\' 6"  (1.676 m)  Wt 171 lb 1.2 oz (77.6 kg)  BMI 27.63 kg/m2  SpO2 100%  HEMODYNAMICS: CVP:  [9 mmHg-16 mmHg] 9 mmHg  VENTILATOR SETTINGS: Vent Mode:  [-]  FiO2 (%):  [40 %-55 %] 40 %  INTAKE / OUTPUT: I/O last 3 completed shifts: In: 986.7 [I.V.:936.7; IV Piggyback:50] Out: 765 [Urine:765]  PHYSICAL EXAMINATION: General: Awake, No distress Neuro: Moves all extremities, no focal deficits HEENT: + JVD, no thyromegaly Cardiovascular: s1 s2 irregular Lungs: Clear Abdomen: Soft, + BS, non tender, nondistended Musculoskeletal: 1+ Edema Skin: No rash  LABS:  BMET  Recent Labs Lab 01/25/16 0248 01/25/16 1500 01/26/16 0445  NA 136 134* 137  K 4.7 4.8 4.1  CL 98* 96* 98*  CO2 15* 29 28  BUN 43* 48* 52*  CREATININE 1.77* 2.09* 2.18*  GLUCOSE 72 182* 106*    Electrolytes  Recent Labs Lab 01/25/16 0245 01/25/16 0248 01/25/16 1500 01/26/16 0445  CALCIUM  --  9.4 9.1 8.8*  MG 1.9  --   --  2.5*  PHOS 6.0*  --   --  4.3    CBC  Recent Labs Lab 01/25/16 0245 01/25/16 1500 01/26/16 0445  WBC 11.3* 11.1* 9.2  HGB 10.4* 9.9* 9.6*  HCT 30.8* 30.1* 29.0*  PLT 224  182 179    Coag's  Recent Labs Lab 01/25/16 0248 01/26/16 0216  APTT 35 >200*  INR 3.00*  --     Sepsis Markers  Recent Labs Lab 01/24/16 2314 01/25/16 0248 01/25/16 1615  LATICACIDVEN 12.0* 8.2* 2.0  PROCALCITON  --  0.59  --     ABG No results for input(s): PHART, PCO2ART, PO2ART in the last 168 hours.  Liver Enzymes  Recent Labs Lab 01/24/16 2314 01/25/16 1500  AST 126* 83*  ALT 258* 199*  ALKPHOS 173* 143*  BILITOT 1.7* 1.7*  ALBUMIN 2.6* 2.1*    Cardiac Enzymes  Recent Labs Lab 01/24/16 2314 01/25/16 0248  TROPONINI 0.03 <0.03    Glucose  Recent Labs Lab 01/25/16 1644 01/25/16 2032 01/26/16 0004 01/26/16 0407 01/26/16 0815 01/26/16 1200  GLUCAP 170* 141* 117* 100* 89 130*    Imaging Dg Chest Port 1 View  01/26/2016  CLINICAL DATA:  Respiratory failure. EXAM: PORTABLE CHEST 1 VIEW COMPARISON:  01/25/2016. FINDINGS: Cardiomegaly with normal pulmonary vascularity. Mild bibasilar infiltrates  and or edema. Small bilateral pleural effusions . IMPRESSION: 1. Cardiomegaly. 2. Mild bibasilar infiltrates and/or pulmonary edema. Small bilateral pleural effusions. Congestive heart failure cannot be excluded. Electronically Signed   By: Maisie Fus  Register   On: 01/26/2016 07:28   Dg Chest Port 1 View  01/25/2016  CLINICAL DATA:  Confirm line placement. EXAM: PORTABLE CHEST 1 VIEW COMPARISON:  01/24/2016 FINDINGS: Right arm PICC line has been placed. Tip in the SVC region. Heart size is mildly enlarged. Again noted are a few scattered parenchymal densities in both lungs which are similar to the prior examination. Negative for a pneumothorax. IMPRESSION: PICC line tip in the SVC region. Patchy parenchymal lung densities are unchanged. Electronically Signed   By: Richarda Overlie M.D.   On: 01/25/2016 14:37   STUDIES:   CULTURES: BC 5/16>>>  ANTIBIOTICS: None  SIGNIFICANT EVENTS: 5/16- cardiogenic shock presentation, lactic  acidosis  LINES/TUBES:   ASSESSMENT / PLAN:  PULMONARY A: Pulm edema P:  Lasix Follow CXR   CARDIOVASCULAR A:  Acute sys HF, PA htn, cardiogenic shock presentation,r/o new valvular dysfxn  P:  HF team following Lasix for diuresis On milrinone Continue amio Follow Coox  RENAL A:  CKD 3, mild hyperkalemia P:  Follow K Follow urine output and Cr.   GASTROINTESTINAL A:  No evidence ischemic bowel, elevated LFTs likley from shock liver P:  Start feeds Follow LFTs  HEMATOLOGIC A:  DVT prevention aFib on eliquis P:  Hold eliquis Start heparin drip for a fib   INFECTIOUS A:  No evidence infection, lactic acidosis, NO sepsis noted P:  Ok to observe off abx Follow WBC trend and temp  ENDOCRINE A:  DM, hypothyroidism P:  SSI Synthroid  NEUROLOGIC A:  Mild pain P:  RASS goal: 0 PRN fentanyl for pain   FAMILY  - Updates:Pt and daughter updated at bedside 5/17 - Inter-disciplinary family meet or Palliative Care meeting due by: 5/23  Interdisciplinary Goals of Care Family Meeting  Date carried out:: 01/26/2016  Location of the meeting: Bedside  Member's involved: Physician, Bedside Registered Nurse and Family Member or next of kin  Durable Power of Attorney or acting medical decision maker: Patient. Daughter was also present    Discussion: We discussed goals of care for [name] .  I discussed the fact that she has terminal end stage heart failure. Now in renal failure also. She is not a candidate for dialysis. Her main goal is to be able to return home. She remains full code but we will get palliative care service involved for further discussions.   Code status: Full Code  Disposition: Continue current acute care  Time spent for the meeting: 10 mins   Crititcal care time- 30 mins.  Chilton Greathouse MD [facility] Pager [phone] If no answer or after 3pm call:  [phone] 01/26/2016, 1:22 PM

## 2016-01-26 NOTE — Consult Note (Signed)
Consultation Note Date: 01/26/2016   Patient Name: Theresa Cortez  DOB: 1930-06-15  MRN: [id number]  Age / Sex: 82 y.o., female  PCP: Gwenyth Bender, MD Referring Physician: Nelda Bucks, MD  Reason for Consultation: Establishing goals of care  HPI/Patient Profile: 82 y.o. female  with past medical history of systolic CHF,HLD, hypothyroidism, RA,MR  admitted on 01/24/2016 with CHF exacerbation, pulmonary edema.   Clinical Assessment and Goals of Care:  [name] is an 82 year old lady who lives at home with her [place]. The patient has had recent recurrent hospitalizations. In March, 2017 she was hospitalized for acute CHF exacerbation, left ventricular ejection fraction 25-30 percent. She has valvular abnormalities mitral regurgitation tricuspid regurgitation. She also has atrial fibrillation with rapid ventricular response. At that time right left heart cath showed nonocclusive coronary artery disease and pulmonary hypertension. Medical management was recommended. She was hospitalized again in April for vomiting abdominal pain. She was with ongoing cough and was flu positive. That hospitalization was also complicated by volume overload requiring diureses.  Patient states she has been complaining of a cough and has noticed lower estimate the edema for the past few months now. This hospitalization is in the context of cardiogenic shock, acute systolic heart failure accompanied by elevated liver function tests as well as worsening renal failure. She is being followed by cardiology. Aggressive attempts are being made at diureses. In light of her ongoing decline, recent recurrent hospitalizations, diagnoses of serious illness a palliative consultation has been recommended. Additionally, in a previous hospitalization, she was diagnosed with possible interstitial lung disease. She has a history  of rheumatoid arthritis. She was seen and evaluated by pulmonary/pleural care medicine service at that time. It was recommended that she seek outpatient pulmonary follow-up and undergo pulmonary function testing. Unfortunately, she has been hospitalized since then.  Palliative care consultation placed for goals of care and additional discussions.  Patient is a pleasant elderly lady resting in bed. She states she has 6 children. She wishes to go home towards the end of this hospitalization. She is hopeful that they would be away for her to be at home for as long as possible. Call placed and discussed with patient's daughters Theresa Cortez and Theresa Cortez not at the bedside. See recommendations below.   HCPOA  2 daughter's, Theresa Cortez and Theresa Cortez her primary Decision makers along with the patient herself who is decisional. Patient has 6 children in total.  SUMMARY OF RECOMMENDATIONS: 1. Brief initial introduction to palliative service and palliative mode of care given.  2. Patient wishes to be in her home in [place], [place] for as long as possible. She states that is really important to her. 3. I'll placed and discussed with patient's 2 daughters Theresa Cortez and Theresa Cortez: Family meeting with palliative medicine service scheduled for 5-18 at 1200. Additional recommendations will follow.     Code Status/Advance Care Planning:  Full code for now. Initial CODE STATUS discussions undertaken both with the patient as well as with the patient's  daughters over the phone. Additional extensive discussions and final recommendations after family meeting on 5-18 at 1200.    Symptom Management:    New current treatment.  Palliative Prophylaxis:   Bowel Regimen  Additional Recommendations (Limitations, Scope, Preferences):  Full Scope Treatment  Psycho-social/Spiritual:   Desire for further Chaplaincy support:no  Additional Recommendations: Caregiving  Support/Resources  Prognosis:   Unable to  determine but appears guarded due to recent recurrent hospitalizations, recent diagnosis of serious illness.  Discharge Planning: To Be Determined      Primary Diagnoses: Present on Admission:  . Acute systolic heart failure (HCC)  I have reviewed the medical record, interviewed the patient and family, and examined the patient. The following aspects are pertinent.  Past Medical History  Diagnosis Date  . Hypertension   . Wound infection (HCC)     Right knee   . Hypothyroidism   . History of diverticulosis   . Hyperlipidemia   . Type II diabetes mellitus (HCC)   . History of blood transfusion X 2    w/knee replacement  . Stroke [facility]) 2000    denies residual on 11/23/2015  . Rheumatoid arthritis(714.0)   . DJD (degenerative joint disease)   . Arthritis     "hands, arms" (11/23/2015)  . CKD (chronic kidney disease), stage III   . Chronic combined systolic and diastolic heart failure (HCC)   . Atrial fibrillation (HCC)   . Interstitial lung disease [facility])    Social History   Social History  . Marital Status: Widowed    Spouse Name: N/A  . Number of Children: N/A  . Years of Education: N/A   Social History Main Topics  . Smoking status: Never Smoker   . Smokeless tobacco: Former Neurosurgeon    Types: Snuff  . Alcohol Use: No  . Drug Use: No  . Sexual Activity: No   Other Topics Concern  . None   Social History Narrative   Family History  Problem Relation Age of Onset  . Hypertension Mother    Scheduled Meds: . antiseptic oral rinse  7 mL Mouth Rinse BID  . famotidine  20 mg Oral QHS  . furosemide  80 mg Intravenous BID  . insulin aspart  0-9 Units Subcutaneous Q4H  . levothyroxine  88 mcg Oral QAC breakfast  . metolazone  2.5 mg Oral Once  . sodium chloride flush  10-40 mL Intracatheter Q12H  . sodium chloride flush  3 mL Intravenous Q12H  . sodium polystyrene  30 g Oral Once   Continuous Infusions: . amiodarone 30 mg/hr (01/26/16 0100)  . dextrose 5 % and  0.45% NaCl 10 mL/hr at 01/25/16 0618  . heparin 650 Units/hr (01/26/16 0400)  . milrinone 0.25 mcg/kg/min (01/26/16 0100)   PRN Meds:.sodium chloride, sodium chloride, sodium chloride flush, sodium chloride flush Medications Prior to Admission:  Prior to Admission medications   Medication Sig Start Date End Date Taking? Authorizing Provider  acetaminophen (TYLENOL) 325 MG tablet Take 2 tablets (650 mg total) by mouth every 12 (twelve) hours as needed for moderate pain. 01/18/16  Yes Albertine Grates, MD  albuterol (PROVENTIL) (2.5 MG/3ML) 0.083% nebulizer solution Take 2.5 mg by nebulization every 4 (four) hours as needed for wheezing or shortness of breath.   Yes Historical Provider, MD  apixaban (ELIQUIS) 2.5 MG TABS tablet Take 1 tablet (2.5 mg total) by mouth 2 (two) times daily. 11/30/15  Yes Leroy Sea, MD  benzonatate (TESSALON) 200 MG capsule Take 1 capsule (200  mg total) by mouth 3 (three) times daily as needed for cough. 01/18/16  Yes Albertine Grates, MD  Digoxin 62.5 MCG TABS Take 0.0625 mg by mouth daily. 01/18/16  Yes Albertine Grates, MD  docusate sodium (COLACE) 100 MG capsule Take 2 capsules (200 mg total) by mouth 2 (two) times daily. Patient taking differently: Take 100 mg by mouth daily.  11/30/15  Yes Leroy Sea, MD  doxycycline (VIBRA-TABS) 100 MG tablet Take 1 tablet (100 mg total) by mouth every 12 (twelve) hours. 01/18/16  Yes Albertine Grates, MD  famotidine (PEPCID) 20 MG tablet Take 1 tablet (20 mg total) by mouth at bedtime. 01/18/16  Yes Albertine Grates, MD  folic acid (FOLVITE) 1 MG tablet Take 1 mg by mouth daily.   Yes Historical Provider, MD  furosemide (LASIX) 40 MG tablet Take 1 tablet (40 mg total) by mouth daily. 01/18/16  Yes Albertine Grates, MD  HYDROcodone-homatropine [facility]) 5-1.5 MG/5ML syrup Take 5 mLs by mouth at bedtime as needed for cough. 44ml daily at bedtime for 14 days. Repeat ultrasound on 01/18/16. 01/18/16  Yes Albertine Grates, MD  hydroxychloroquine (PLAQUENIL) 200 MG tablet Take 200 mg by mouth 2 (two)  times daily.   Yes Historical Provider, MD  insulin aspart (NOVOLOG) 100 UNIT/ML injection Inject 0-11 Units into the skin 2 (two) times daily before a meal. Check glucose at 7:30am and 4:30pm.  60-100: 0 units; 101-150: 2 units; 151-200: 3 units; 201-250: 5 units; 251-300: 7 units; 301-350: 9 units; >350: 11 units   Yes Historical Provider, MD  latanoprost (XALATAN) 0.005 % ophthalmic solution Place 1 drop into both eyes at bedtime.   Yes Historical Provider, MD  levothyroxine (SYNTHROID, LEVOTHROID) 88 MCG tablet Take 88 mcg by mouth daily before breakfast.   Yes Historical Provider, MD  loratadine (CLARITIN) 10 MG tablet Take 10 mg by mouth daily.   Yes Historical Provider, MD  Nutritional Supplements (NUTRITIONAL DRINK PO) Take 120 mLs by mouth 3 (three) times daily.   Yes Historical Provider, MD  polyethylene glycol (MIRALAX / GLYCOLAX) packet Take 17 g by mouth daily. Patient not taking: Reported on 01/24/2016 12/20/15   Calvert Cantor, MD   Allergies  Allergen Reactions  . Penicillins Swelling    Has patient had a PCN reaction causing immediate rash, facial/tongue/throat swelling, SOB or lightheadedness with hypotension: unknown Has patient had a PCN reaction causing severe rash involving mucus membranes or skin necrosis: unknown Has patient had a PCN reaction that required hospitalization: unknown Has patient had a PCN reaction occurring within the last 10 years: unknown If all of the above answers are "NO", then may proceed with Cephalosporin use.    Review of Systems + for cough, weakness  Physical Exam Elderly-appearing, chronically ill-appearing pleasant African-American lady resting in bed Shallow clear breath sounds anteriorly S1-S2 irregular Abdomen soft mildly distended Extremities trace bilateral lower extremity edema Patient is awake alert, responds appropriately to questions asked.  Vital Signs: BP 104/53 mmHg  Pulse 122  Temp(Src) 97.3 F (36.3 C) (Oral)  Resp 19   Ht 5\' 6"  (1.676 m)  Wt 77.6 kg (171 lb 1.2 oz)  BMI 27.63 kg/m2  SpO2 95% Pain Assessment: No/denies pain   Pain Score: 0-No pain   SpO2: SpO2: 95 % O2 Device:SpO2: 95 % O2 Flow Rate: .O2 Flow Rate (L/min): 2 L/min  IO: Intake/output summary:  Intake/Output Summary (Last 24 hours) at 01/26/16 1651 Last data filed at 01/26/16 1600  Gross per 24 hour  Intake  1229.8 ml  Output    840 ml  Net  389.8 ml    LBM:   Baseline Weight: Weight: 78.4 kg (172 lb 13.5 oz) Most recent weight: Weight: 77.6 kg (171 lb 1.2 oz)     Palliative Assessment/Data:   Flowsheet Rows        Most Recent Value   Intake Tab    Referral Department  Cardiology   Unit at Time of Referral  ICU   Palliative Care Primary Diagnosis  Cardiac   Palliative Care Type  New Palliative care   Reason for referral  Clarify Goals of Care   Date first seen by Palliative Care  01/26/16   Clinical Assessment    Palliative Performance Scale Score  30%   Pain Max last 24 hours  3   Pain Min Last 24 hours  2   Dyspnea Max Last 24 Hours  5   Dyspnea Min Last 24 hours  4   Psychosocial & Spiritual Assessment    Palliative Care Outcomes    Patient/Family meeting held?  Yes   Who was at the meeting?  patient, daughters over the phone.    Palliative Care follow-up planned  Yes, Facility      Time In: 1500  Time Out: 1600 Time Total: 60 min  Greater than 50%  of this time was spent counseling and coordinating care related to the above assessment and plan.  Signed by: Rosalin Hawking, MD  [phone]  Please contact Palliative Medicine Team phone at [phone] for questions and concerns.  For individual provider: See Loretha Stapler

## 2016-01-27 DIAGNOSIS — N183 Chronic kidney disease, stage 3 (moderate): Secondary | ICD-10-CM

## 2016-01-27 DIAGNOSIS — E039 Hypothyroidism, unspecified: Secondary | ICD-10-CM

## 2016-01-27 LAB — CBC
HEMATOCRIT: 28.4 % — AB (ref 36.0–46.0)
HEMOGLOBIN: 9.5 g/dL — AB (ref 12.0–15.0)
MCH: 31.1 pg (ref 26.0–34.0)
MCHC: 33.5 g/dL (ref 30.0–36.0)
MCV: 93.1 fL (ref 78.0–100.0)
Platelets: 201 10*3/uL (ref 150–400)
RBC: 3.05 MIL/uL — ABNORMAL LOW (ref 3.87–5.11)
RDW: 17 % — ABNORMAL HIGH (ref 11.5–15.5)
WBC: 6 10*3/uL (ref 4.0–10.5)

## 2016-01-27 LAB — MAGNESIUM: MAGNESIUM: 2.3 mg/dL (ref 1.7–2.4)

## 2016-01-27 LAB — HEPATIC FUNCTION PANEL
ALBUMIN: 1.9 g/dL — AB (ref 3.5–5.0)
ALT: 135 U/L — ABNORMAL HIGH (ref 14–54)
AST: 56 U/L — ABNORMAL HIGH (ref 15–41)
Alkaline Phosphatase: 131 U/L — ABNORMAL HIGH (ref 38–126)
BILIRUBIN TOTAL: 1.2 mg/dL (ref 0.3–1.2)
Bilirubin, Direct: 0.4 mg/dL (ref 0.1–0.5)
Indirect Bilirubin: 0.8 mg/dL (ref 0.3–0.9)
TOTAL PROTEIN: 5.8 g/dL — AB (ref 6.5–8.1)

## 2016-01-27 LAB — GLUCOSE, CAPILLARY
GLUCOSE-CAPILLARY: 70 mg/dL (ref 65–99)
GLUCOSE-CAPILLARY: 116 mg/dL — AB (ref 65–99)
GLUCOSE-CAPILLARY: 179 mg/dL — AB (ref 65–99)
GLUCOSE-CAPILLARY: 189 mg/dL — AB (ref 65–99)
Glucose-Capillary: 121 mg/dL — ABNORMAL HIGH (ref 65–99)
Glucose-Capillary: 398 mg/dL — ABNORMAL HIGH (ref 65–99)

## 2016-01-27 LAB — BASIC METABOLIC PANEL
ANION GAP: 11 (ref 5–15)
BUN: 58 mg/dL — ABNORMAL HIGH (ref 6–20)
CALCIUM: 8.4 mg/dL — AB (ref 8.9–10.3)
CO2: 27 mmol/L (ref 22–32)
Chloride: 95 mmol/L — ABNORMAL LOW (ref 101–111)
Creatinine, Ser: 2.51 mg/dL — ABNORMAL HIGH (ref 0.44–1.00)
GFR, EST AFRICAN AMERICAN: 19 mL/min — AB (ref >60)
GFR, EST NON AFRICAN AMERICAN: 17 mL/min — AB (ref >60)
GLUCOSE: 78 mg/dL (ref 65–99)
POTASSIUM: 3.8 mmol/L (ref 3.5–5.1)
SODIUM: 133 mmol/L — AB (ref 135–145)

## 2016-01-27 LAB — CARBOXYHEMOGLOBIN
CARBOXYHEMOGLOBIN: 1.2 % (ref 0.5–1.5)
METHEMOGLOBIN: 0.7 % (ref 0.0–1.5)
O2 Saturation: 61.7 %
Total hemoglobin: 9.8 g/dL — ABNORMAL LOW (ref 12.0–16.0)

## 2016-01-27 LAB — HEPARIN LEVEL (UNFRACTIONATED): Heparin Unfractionated: 0.79 IU/mL — ABNORMAL HIGH (ref 0.30–0.70)

## 2016-01-27 LAB — APTT
APTT: 69 s — AB (ref 24–37)
aPTT: 54 seconds — ABNORMAL HIGH (ref 24–37)

## 2016-01-27 LAB — PHOSPHORUS: PHOSPHORUS: 3.9 mg/dL (ref 2.5–4.6)

## 2016-01-27 MED ORDER — AMIODARONE HCL IN DEXTROSE 360-4.14 MG/200ML-% IV SOLN
60.0000 mg/h | INTRAVENOUS | Status: DC
  Administered 2016-01-27 – 2016-01-31 (×12): 60 mg/h via INTRAVENOUS
  Filled 2016-01-27 (×15): qty 200

## 2016-01-27 MED ORDER — HEPARIN (PORCINE) IN NACL 100-0.45 UNIT/ML-% IJ SOLN
750.0000 [IU]/h | INTRAMUSCULAR | Status: DC
  Administered 2016-01-27 – 2016-01-29 (×3): 600 [IU]/h via INTRAVENOUS
  Administered 2016-01-30: 750 [IU]/h via INTRAVENOUS
  Filled 2016-01-27 (×2): qty 250

## 2016-01-27 MED ORDER — AMIODARONE HCL IN DEXTROSE 360-4.14 MG/200ML-% IV SOLN
30.0000 mg/h | INTRAVENOUS | Status: DC
Start: 2016-01-27 — End: 2016-01-27

## 2016-01-27 MED ORDER — APIXABAN 2.5 MG PO TABS: 2.5000 mg | ORAL_TABLET | Freq: Two times a day (BID) | ORAL | Status: DC

## 2016-01-27 MED ORDER — AMIODARONE LOAD VIA INFUSION
150.0000 mg | Freq: Once | INTRAVENOUS | Status: AC
  Administered 2016-01-27: 150 mg via INTRAVENOUS

## 2016-01-27 MED ORDER — AMIODARONE HCL IN DEXTROSE 360-4.14 MG/200ML-% IV SOLN
60.0000 mg/h | INTRAVENOUS | Status: DC
  Administered 2016-01-27: 60 mg/h via INTRAVENOUS
  Filled 2016-01-27: qty 200

## 2016-01-27 MED ORDER — AMIODARONE HCL IN DEXTROSE 360-4.14 MG/200ML-% IV SOLN
60.0000 mg/h | INTRAVENOUS | Status: AC
  Administered 2016-01-27: 60 mg/h via INTRAVENOUS
  Filled 2016-01-27 (×2): qty 200

## 2016-01-27 MED ORDER — AMIODARONE LOAD VIA INFUSION
150.0000 mg | Freq: Once | INTRAVENOUS | Status: AC
  Administered 2016-01-27: 150 mg via INTRAVENOUS
  Filled 2016-01-27: qty 83.34

## 2016-01-27 MED ORDER — NOREPINEPHRINE BITARTRATE 1 MG/ML IV SOLN
0.0000 ug/min | INTRAVENOUS | Status: DC
  Administered 2016-01-27: 5 ug/min via INTRAVENOUS
  Administered 2016-01-28: 13 ug/min via INTRAVENOUS
  Administered 2016-01-28: 8 ug/min via INTRAVENOUS
  Filled 2016-01-27 (×4): qty 4

## 2016-01-27 MED ORDER — METOLAZONE 2.5 MG PO TABS: 2.5000 mg | ORAL_TABLET | Freq: Every day | ORAL | Status: DC

## 2016-01-27 MED ORDER — AMIODARONE IV BOLUS ONLY 150 MG/100ML: 150.0000 mg | Freq: Once | INTRAVENOUS | Status: DC

## 2016-01-27 NOTE — Progress Notes (Signed)
ANTICOAGULATION CONSULT NOTE Pharmacy Consult for heparin  Indication: Afib   Allergies  Allergen Reactions  . Penicillins Swelling    Has patient had a PCN reaction causing immediate rash, facial/tongue/throat swelling, SOB or lightheadedness with hypotension: unknown Has patient had a PCN reaction causing severe rash involving mucus membranes or skin necrosis: unknown Has patient had a PCN reaction that required hospitalization: unknown Has patient had a PCN reaction occurring within the last 10 years: unknown If all of the above answers are "NO", then may proceed with Cephalosporin use.     Patient Measurements: Height: 5\' 6"  (167.6 cm) Weight: 175 lb 7.8 oz (79.6 kg) IBW/kg (Calculated) : 59.3  Vital Signs: Temp: 97.6 F (36.4 C) (05/18 1230) Temp Source: Oral (05/18 1230) BP: 98/52 mmHg (05/18 1430) Pulse Rate: 125 (05/18 1430)  Labs:  Recent Labs  01/24/16 2314  01/25/16 0248 01/25/16 1500 01/25/16 2200  01/26/16 0445  01/26/16 1200 01/26/16 1520 01/27/16 0333 01/27/16 0339 01/27/16 1529  HGB 12.8  < >  --  9.9*  --   --  9.6*  --   --   --  9.5*  --   --   HCT 37.9  < >  --  30.1*  --   --  29.0*  --   --   --  28.4*  --   --   PLT 169  < >  --  182  --   --  179  --   --   --  201  --   --   APTT  --   --  35  --   --   < >  --   < >  --  101* 54*  --  69*  LABPROT  --   --  30.6*  --   --   --   --   --   --   --   --   --   --   INR  --   --  3.00*  --   --   --   --   --   --   --   --   --   --   HEPARINUNFRC  --   --   --   --  1.34*  --   --   --  0.95*  --   --  0.79*  --   CREATININE 1.69*  --  1.77* 2.09*  --   --  2.18*  --   --   --  2.51*  --   --   TROPONINI 0.03  --  <0.03  --   --   --   --   --   --   --   --   --   --   < > = values in this interval not displayed.  Estimated Creatinine Clearance: 18.1 mL/min (by C-G formula based on Cr of 2.51).   Assessment: 82 y.o. female admitted with CHF exacerbation, Eliquis PTA for Afib (last dose  5/15 AM), now on heparin.  Heparin drip 600 uts/hr aPTT at goal 69 sec Discussion regarding long term AC options   Goal of Therapy:  APTT 66-102 sec Heparin level 0.3-0.7 units/ml Monitor platelets by anticoagulation protocol: Yes   Plan:  Continue heparin 600 uts/hr Daily HL, aptt, CBC   6/15 Pharm.D. CPP, BCPS Clinical Pharmacist [phone] 01/27/2016 4:23 PM

## 2016-01-27 NOTE — Progress Notes (Signed)
Physical Therapy Evaluation Patient Details Name: Theresa Cortez MRN: [id number] DOB: 1929/09/25 Today's Date: 01/27/2016   History of Present Illness  82 yo female admitted from SNF with acute on chronic HF. Hx of HTN, DM, CHF, CVA, CKD, A fib, RA, OA.   Clinical Impression  Patient eager to participate with PT (on entering she asked if she could get OOB). Explained need to start slow and see what her BP tolerates. (See flowsheet for details). Over the course of 13 minutes (with multiple rests) patient completed very limited exercises with BP decreasing (MAP 67 to 63 to 61 with pt asymptomatic). Explained reason for need to end session and patient disappointed but understanding. Seems patient either does not understand the extent of her illness or is in denial. Very doubtful she will be able to participate/tolerate therapies at SNF level of care. Would recommend if she returns to SNF, it should be under Hospice (do not think she will qualify for skilled PT).   Patient is delightful and eager to work with therapy. Will continue to follow as medications continue to be adjusted. If she can tolerate/participate with therapy, will proceed as her goal is to continue and be able to get OOB.     Follow Up Recommendations SNF;Supervision/Assistance - 24 hour (continue to assess ability to tolerate PT)    Equipment Recommendations  None recommended by PT    Recommendations for Other Services       Precautions / Restrictions Precautions Precautions: Fall;Other (comment) Precaution Comments: monitor BP (SBP>85 per MD)      Mobility  Bed Mobility               General bed mobility comments: tolerating bed ROM/ex's poorly and did not attempt  Transfers                    Ambulation/Gait                Stairs            Wheelchair Mobility    Modified Rankin (Stroke Patients Only)       Balance                                              Pertinent Vitals/Pain Pain Assessment: No/denies pain    Home Living Family/patient expects to be discharged to:: Skilled nursing facility                 Additional Comments: pt was at [facility] for ST rehab following last admission    Prior Function Level of Independence: Needs assistance   Gait / Transfers Assistance Needed: reports she was walking "a little" with PT at SNF           Hand Dominance        Extremity/Trunk Assessment   Upper Extremity Assessment: Generalized weakness (limited assessment due to BP declining)           Lower Extremity Assessment: RLE deficits/detail;LLE deficits/detail (ankles WFL, Knee flexion to 90 with stiffness)         Communication   Communication: No difficulties  Cognition Arousal/Alertness: Awake/alert Behavior During Therapy: WFL for tasks assessed/performed Overall Cognitive Status: Within Functional Limits for tasks assessed                      General Comments  General comments (skin integrity, edema, etc.): Patient very pleasant and again expressed her desire to resume Rehab at [facility].    Exercises General Exercises - Upper Extremity Digit Composite Flexion: AROM;Both;10 reps Composite Extension: AROM;Both;10 reps General Exercises - Lower Extremity Ankle Circles/Pumps: AROM;Both;10 reps Quad Sets: AROM;Both;5 reps (alternating legs)      Assessment/Plan    PT Assessment  (Patient wants a trial of PT (?medically can tolerate))  PT Diagnosis Generalized weakness   PT Problem List Decreased strength;Decreased range of motion;Decreased activity tolerance;Decreased mobility;Cardiopulmonary status limiting activity  PT Treatment Interventions Functional mobility training;Therapeutic activities;Therapeutic exercise;Patient/family education   PT Goals (Current goals can be found in the Care Plan section) Acute Rehab PT Goals Patient Stated Goal: back to rehab PT Goal Formulation: With  patient Time For Goal Achievement: 02/10/16 Potential to Achieve Goals: Fair    Frequency Min 2X/week   Barriers to discharge        Co-evaluation               End of Session   Activity Tolerance: Treatment limited secondary to medical complications (Comment) (hypotensive (asymptomatic)) Patient left: in bed;with call bell/phone within reach;with family/visitor present;with SCD's reapplied Nurse Communication: Other (comment) (intolerant of activity (BP))         Time: 0786-7544 PT Time Calculation (min) (ACUTE ONLY): 20 min   Charges:   PT Evaluation $PT Eval Low Complexity: 1 Procedure     PT G Codes:        Davan Nawabi 2016/02/13, 4:41 PM Pager [phone]

## 2016-01-27 NOTE — Progress Notes (Addendum)
Advanced Heart Failure Rounding Note   Subjective:    Yesterday diuresed with IV lasix + metolazone. Also continued on milrinone 0.25 mcg. Over night amio stopped due to trigeminy and hypotension. Sluggish urine output. Creatinine trending up. SBP as low as 70 briefly.   Denis SOB.    Objective:   Weight Range:  Vital Signs:   Temp:  [97.3 F (36.3 C)-98.2 F (36.8 C)] 98.2 F (36.8 C) (05/18 0400) Pulse Rate:  [42-132] 68 (05/18 0600) Resp:  [15-28] 17 (05/18 0600) BP: (70-104)/(44-67) 81/58 mmHg (05/18 0600) SpO2:  [93 %-100 %] 99 % (05/18 0600) Weight:  [175 lb 7.8 oz (79.6 kg)] 175 lb 7.8 oz (79.6 kg) (05/18 0500)    Weight change: Filed Weights   01/25/16 0538 01/26/16 0452 01/27/16 0500  Weight: 172 lb 13.5 oz (78.4 kg) 171 lb 1.2 oz (77.6 kg) 175 lb 7.8 oz (79.6 kg)    Intake/Output:   Intake/Output Summary (Last 24 hours) at 01/27/16 0747 Last data filed at 01/27/16 0600  Gross per 24 hour  Intake  909.3 ml  Output    715 ml  Net  194.3 ml     Physical Exam: CVP ~13  Sitting in bed eating breakfast   General: Chronically ill . No resp difficulty. On 2 liters  HEENT: normal Neck: supple. JVP ear.  Carotids 2+ bilat; no bruits. No lymphadenopathy or thryomegaly appreciated. Cor: PMI nondisplaced. Irregular rate & rhythm. No rubs or 2/6 MR/TR  Loud s3 Lungs: clear on 2 liters.  Abdomen: soft, nontender, nondistended. No hepatosplenomegaly. No bruits or masses. Good bowel sounds. Extremities: no cyanosis, clubbing, rash, R and LLE 1+ edema SCDs on RLE and LLE  Neuro: alert & orientedx3, cranial nerves grossly intact. moves all 4 extremities w/o difficulty. Affect pleasant  Telemetry: A Fib 120-130s   Labs: Basic Metabolic Panel:  Recent Labs Lab 01/24/16 2314 01/25/16 0245 01/25/16 0248 01/25/16 1500 01/26/16 0445 01/27/16 0333  NA 135  --  136 134* 137 133*  K 5.7*  --  4.7 4.8 4.1 3.8  CL 96*  --  98* 96* 98* 95*  CO2 15*  --  15* 29  28 27   GLUCOSE 86  --  72 182* 106* 78  BUN 41*  --  43* 48* 52* 58*  CREATININE 1.69*  --  1.77* 2.09* 2.18* 2.51*  CALCIUM 9.5  --  9.4 9.1 8.8* 8.4*  MG  --  1.9  --   --  2.5* 2.3  PHOS  --  6.0*  --   --  4.3 3.9    Liver Function Tests:  Recent Labs Lab 01/24/16 2314 01/25/16 1500  AST 126* 83*  ALT 258* 199*  ALKPHOS 173* 143*  BILITOT 1.7* 1.7*  PROT 7.0 6.2*  ALBUMIN 2.6* 2.1*    Recent Labs Lab 01/24/16 2314 01/25/16 0248  LIPASE 30 26  AMYLASE  --  96   No results for input(s): AMMONIA in the last 168 hours.  CBC:  Recent Labs Lab 01/24/16 2314 01/25/16 0245 01/25/16 1500 01/26/16 0445 01/27/16 0333  WBC 6.4 11.3* 11.1* 9.2 6.0  NEUTROABS 4.9  --   --   --   --   HGB 12.8 10.4* 9.9* 9.6* 9.5*  HCT 37.9 30.8* 30.1* 29.0* 28.4*  MCV 97.2 98.7 95.0 93.5 93.1  PLT 169 224 182 179 201    Cardiac Enzymes:  Recent Labs Lab 01/24/16 2314 01/25/16 0248  TROPONINI 0.03 <0.03  BNP: BNP (last 3 results)  Recent Labs  01/14/16 1436 01/15/16 0523 01/24/16 2314  BNP 1245.4* 1386.8* 2194.5*    ProBNP (last 3 results) No results for input(s): PROBNP in the last 8760 hours.    Other results:  Imaging: Dg Chest Port 1 View  01/26/2016  CLINICAL DATA:  Respiratory failure. EXAM: PORTABLE CHEST 1 VIEW COMPARISON:  01/25/2016. FINDINGS: Cardiomegaly with normal pulmonary vascularity. Mild bibasilar infiltrates and or edema. Small bilateral pleural effusions . IMPRESSION: 1. Cardiomegaly. 2. Mild bibasilar infiltrates and/or pulmonary edema. Small bilateral pleural effusions. Congestive heart failure cannot be excluded. Electronically Signed   By: Maisie Fus  Register   On: 01/26/2016 07:28   Dg Chest Port 1 View  01/25/2016  CLINICAL DATA:  Confirm line placement. EXAM: PORTABLE CHEST 1 VIEW COMPARISON:  01/24/2016 FINDINGS: Right arm PICC line has been placed. Tip in the SVC region. Heart size is mildly enlarged. Again noted are a few scattered  parenchymal densities in both lungs which are similar to the prior examination. Negative for a pneumothorax. IMPRESSION: PICC line tip in the SVC region. Patchy parenchymal lung densities are unchanged. Electronically Signed   By: Richarda Overlie M.D.   On: 01/25/2016 14:37     Medications:     Scheduled Medications: . antiseptic oral rinse  7 mL Mouth Rinse BID  . famotidine  20 mg Oral QHS  . insulin aspart  0-9 Units Subcutaneous Q4H  . levothyroxine  88 mcg Oral QAC breakfast  . sodium chloride flush  10-40 mL Intracatheter Q12H  . sodium chloride flush  3 mL Intravenous Q12H  . sodium polystyrene  30 g Oral Once    Infusions: . amiodarone Stopped (01/26/16 2140)  . dextrose 5 % and 0.45% NaCl 10 mL/hr at 01/25/16 0618  . heparin 600 Units/hr (01/27/16 0550)  . milrinone 0.25 mcg/kg/min (01/26/16 1707)    PRN Medications: sodium chloride, sodium chloride, sodium chloride flush, sodium chloride flush   Assessment:  1. Cardiogenic Shock 2. A/C Systolic Heart Failure  --EF 25-30% with NICM and severe MR 3. Acute A fib RVR-  4. Lactic Acidosis 5. CKD Stage IV 6. Elevated AST/ALT  7. Hypothyroidism 8. Hyperkalemia 9. Hypoalbuminemia- Albumin 2.6  10. Severe MR/TR   Plan/Discussion:     Over night amio stopped.  Back in A fib RVR. Restart amio. Continue heparin.  ? DC-CV.   CVP remains elevated 13. Sluggish diuresis noted. Continue IV lasix 80 mg twice a day + milrinone 0.25 mcg. Add 2.5 mg metolazone. Renal function trending up. Not making much progress.    No BB . BP soft no hydralazine/imdur.   Needs Palliative Care for goals of care.   Length of Stay: 2   Amy Clegg NP-C  01/27/2016, 7:47 AM  Advanced Heart Failure Team Pager [phone] (M-F; 7a - 4p)  Please contact [facility] for night-coverage after hours (4p -7a ) and weekends on [email or web address]  Patient seen and examined with Tonye Becket, NP. We discussed all aspects of the encounter. I agree with the  assessment and plan as stated above.   Remains on milrinone. Feels better but CVP remains elevated and renal function getting worse despite adequate co-ox. I think her valvular disease is playing a major role here.   Would hold diuretics at this point and let her equilibrate. She was in NSR last admit and I think it is reasonable to try getting her back into NSR to see if that helps wean her from inotropes. Overall  though, I think we are getting very close to Hospice being the best option. If BP drops below 85 would consider norepi for BP support. Will try to arrange for DC-CV later today or tomorrow. (has been continually anticoagulated). Restart amio +/- ranexa.   The patient is critically ill with multiple organ systems failure and requires high complexity decision making for assessment and support, frequent evaluation and titration of therapies, application of advanced monitoring technologies and extensive interpretation of multiple databases.   Critical Care Time devoted to patient care services described in this note is 35 Minutes.  Bensimhon, Daniel,MD 8:46 AM

## 2016-01-27 NOTE — Clinical Social Work Note (Signed)
Clinical Social Work Assessment  Patient Details  Name: Theresa Cortez MRN: [id number] Date of Birth: 05/08/1930  Date of referral:  01/27/16               Reason for consult:  Facility Placement                Permission sought to share information with:  Family Supports Permission granted to share information::  Yes, Verbal Permission Granted  Name::     Racheal Mathurin  Agency::  [facility]  Relationship::  Daughter  Contact Information:  [phone]  Housing/Transportation Living arrangements for the past 2 months:  Skilled Nursing Facility Source of Information:  Adult Children Patient Interpreter Needed:  None Criminal Activity/Legal Involvement Pertinent to Current Situation/Hospitalization:  No - Comment as needed Significant Relationships:  Adult Children Lives with:  Facility Resident Do you feel safe going back to the place where you live?  Yes Need for family participation in patient care:  Yes (Comment) (Patient's daughter active in patient's care.)  Care giving concerns:  Patient's daughter expressed no concerns at this time.   Social Worker assessment / plan:  LCSW received referral stating patient admitted from facility [facility]). LCSW spoke with patient's daughter who confirmed patient from [place] and will return at time of discharge. Patient to return to [facility] at time of discharge. LCSW to continue to follow and assist with discharge planning needs.  Employment status:  Retired Health and safety inspector:  Medicare PT Recommendations:  Skilled Nursing Facility Information / Referral to community resources:  Skilled Nursing Facility  Patient/Family's Response to care:  Patient's family understanding and agreeable to [employer] plan of care.  Patient/Family's Understanding of and Emotional Response to Diagnosis, Current Treatment, and Prognosis:  Patient's family understanding and agreeable to LCSW plan of care.  Emotional Assessment Appearance:  Other (Comment  Required (LCSW spoke with patient's daughter.) Attitude/Demeanor/Rapport:  Other (LCSW spoke with patient's daughter.) Affect (typically observed):  Other (LCSW spoke with patient's daughter.) Orientation:  Oriented to Self, Oriented to Place Alcohol / Substance use:  Not Applicable Psych involvement (Current and /or in the community):  No (Comment) (Not appropriate on this admission.)  Discharge Needs  Concerns to be addressed:  No discharge needs identified Readmission within the last 30 days:  No Current discharge risk:  None Barriers to Discharge:  No Barriers Identified   Rod Mae, LCSW 01/27/2016, 2:42 PM [phone]

## 2016-01-27 NOTE — Progress Notes (Signed)
[facility] PROGRESS NOTE  Theresa Cortez ZGY:[id number] DOB: 01/27/1930 DOA: 01/24/2016  PCP: Gwenyth Bender, MD  Brief HPI: 82 year old African-American female with a past medical history of systolic CHF, who was recently hospitalized for CHF exacerbation, presented with shortness of breath. She was admitted to the intensive care unit. Cardiology was consulted.  Past medical history:  Past Medical History  Diagnosis Date  . Hypertension   . Wound infection (HCC)     Right knee   . Hypothyroidism   . History of diverticulosis   . Hyperlipidemia   . Type II diabetes mellitus (HCC)   . History of blood transfusion X 2    w/knee replacement  . Stroke [facility]) 2000    denies residual on 11/23/2015  . Rheumatoid arthritis(714.0)   . DJD (degenerative joint disease)   . Arthritis     "hands, arms" (11/23/2015)  . CKD (chronic kidney disease), stage III   . Chronic combined systolic and diastolic heart failure (HCC)   . Atrial fibrillation (HCC)   . Interstitial lung disease [facility])     Consultants: Cardiology  Procedures: Central line placement  Antibiotics: None  Subjective: Patient denies any chest pain this morning. States that she is breathing well. Denies any cough. No nausea or vomiting.  Objective:  Vital Signs  Filed Vitals:   01/27/16 1030 01/27/16 1100 01/27/16 1130 01/27/16 1230  BP: 75/56 86/59 90/61    Pulse: 60 128 128   Temp:    97.6 F (36.4 C)  TempSrc:    Oral  Resp: 23 18 21    Height:      Weight:      SpO2: 93% 95% 95%     Intake/Output Summary (Last 24 hours) at 01/27/16 1301 Last data filed at 01/27/16 0919  Gross per 24 hour  Intake  724.7 ml  Output    640 ml  Net   84.7 ml   Filed Weights   01/25/16 0538 01/26/16 0452 01/27/16 0500  Weight: 78.4 kg (172 lb 13.5 oz) 77.6 kg (171 lb 1.2 oz) 79.6 kg (175 lb 7.8 oz)    General appearance: alert, cooperative, appears stated age and no distress Resp: Diminished air entry at the bases  with crackles bilaterally. No wheezing. No rhonchi. Cardio: S1, S2 is tachycardic. Regular. No S3, S4. Systolic murmur appreciated over the precordium. GI: soft, non-tender; bowel sounds normal; no masses,  no organomegaly Extremities: Minimal edema bilaterally in the lower extremities Neurologic: Awake and alert. Oriented 3. No focal neurological deficits.  Lab Results:  Data Reviewed: I have personally reviewed following labs and imaging studies  CBC:  Recent Labs Lab 01/24/16 2314 01/25/16 0245 01/25/16 1500 01/26/16 0445 01/27/16 0333  WBC 6.4 11.3* 11.1* 9.2 6.0  NEUTROABS 4.9  --   --   --   --   HGB 12.8 10.4* 9.9* 9.6* 9.5*  HCT 37.9 30.8* 30.1* 29.0* 28.4*  MCV 97.2 98.7 95.0 93.5 93.1  PLT 169 224 182 179 201   Basic Metabolic Panel:  Recent Labs Lab 01/24/16 2314 01/25/16 0245 01/25/16 0248 01/25/16 1500 01/26/16 0445 01/27/16 0333  NA 135  --  136 134* 137 133*  K 5.7*  --  4.7 4.8 4.1 3.8  CL 96*  --  98* 96* 98* 95*  CO2 15*  --  15* 29 28 27   GLUCOSE 86  --  72 182* 106* 78  BUN 41*  --  43* 48* 52* 58*  CREATININE 1.69*  --  1.77* 2.09* 2.18* 2.51*  CALCIUM 9.5  --  9.4 9.1 8.8* 8.4*  MG  --  1.9  --   --  2.5* 2.3  PHOS  --  6.0*  --   --  4.3 3.9   GFR: Estimated Creatinine Clearance: 18.1 mL/min (by C-G formula based on Cr of 2.51).  Liver Function Tests:  Recent Labs Lab 01/24/16 2314 01/25/16 1500 01/27/16 0947  AST 126* 83* 56*  ALT 258* 199* 135*  ALKPHOS 173* 143* 131*  BILITOT 1.7* 1.7* 1.2  PROT 7.0 6.2* 5.8*  ALBUMIN 2.6* 2.1* 1.9*    Recent Labs Lab 01/24/16 2314 01/25/16 0248  LIPASE 30 26  AMYLASE  --  96   Coagulation Profile:  Recent Labs Lab 01/25/16 0248  INR 3.00*   Cardiac Enzymes:  Recent Labs Lab 01/24/16 2314 01/25/16 0248  TROPONINI 0.03 <0.03   CBG:  Recent Labs Lab 01/26/16 1935 01/27/16 0015 01/27/16 0341 01/27/16 0836 01/27/16 1233  GLUCAP 133* 121* 70 116* 179*   Urine  analysis:    Component Value Date/Time   COLORURINE AMBER* 01/24/2016 2118   APPEARANCEUR CLOUDY* 01/24/2016 2118   LABSPEC 1.018 01/24/2016 2118   PHURINE 5.0 01/24/2016 2118   GLUCOSEU NEGATIVE 01/24/2016 2118   HGBUR NEGATIVE 01/24/2016 2118   BILIRUBINUR NEGATIVE 01/24/2016 2118   KETONESUR NEGATIVE 01/24/2016 2118   PROTEINUR NEGATIVE 01/24/2016 2118   UROBILINOGEN 0.2 01/27/2011 1919   NITRITE NEGATIVE 01/24/2016 2118   LEUKOCYTESUR NEGATIVE 01/24/2016 2118    Recent Results (from the past 240 hour(s))  Urine culture     Status: None   Collection Time: 01/24/16  9:18 PM  Result Value Ref Range Status   Specimen Description URINE, CATHETERIZED  Final   Special Requests NONE  Final   Culture NO GROWTH Performed at [facility]   Final   Report Status 01/26/2016 FINAL  Final  Culture, blood (routine x 2)     Status: None (Preliminary result)   Collection Time: 01/25/16  2:48 AM  Result Value Ref Range Status   Specimen Description BLOOD LEFT HAND  Final   Special Requests IN PEDIATRIC BOTTLE 2CC  Final   Culture   Final    NO GROWTH 1 DAY Performed at [facility]    Report Status PENDING  Incomplete      Radiology Studies: Dg Chest Port 1 View  01/26/2016  CLINICAL DATA:  Respiratory failure. EXAM: PORTABLE CHEST 1 VIEW COMPARISON:  01/25/2016. FINDINGS: Cardiomegaly with normal pulmonary vascularity. Mild bibasilar infiltrates and or edema. Small bilateral pleural effusions . IMPRESSION: 1. Cardiomegaly. 2. Mild bibasilar infiltrates and/or pulmonary edema. Small bilateral pleural effusions. Congestive heart failure cannot be excluded. Electronically Signed   By: Maisie Fus  Register   On: 01/26/2016 07:28   Dg Chest Port 1 View  01/25/2016  CLINICAL DATA:  Confirm line placement. EXAM: PORTABLE CHEST 1 VIEW COMPARISON:  01/24/2016 FINDINGS: Right arm PICC line has been placed. Tip in the SVC region. Heart size is mildly enlarged. Again noted are a few  scattered parenchymal densities in both lungs which are similar to the prior examination. Negative for a pneumothorax. IMPRESSION: PICC line tip in the SVC region. Patchy parenchymal lung densities are unchanged. Electronically Signed   By: Richarda Overlie M.D.   On: 01/25/2016 14:37     Medications:  Scheduled: . antiseptic oral rinse  7 mL Mouth Rinse BID  . famotidine  20 mg Oral QHS  . insulin aspart  0-9 Units Subcutaneous Q4H  . levothyroxine  88 mcg Oral QAC breakfast  . sodium chloride flush  10-40 mL Intracatheter Q12H  . sodium chloride flush  3 mL Intravenous Q12H   Continuous: . amiodarone Stopped (01/26/16 2140)  . amiodarone 60 mg/hr (01/27/16 0919)  . amiodarone    . dextrose 5 % and 0.45% NaCl 10 mL/hr at 01/25/16 0618  . heparin 600 Units/hr (01/27/16 0849)  . milrinone 0.125 mcg/kg/min (01/27/16 1138)   OHY:WVPXTG chloride, sodium chloride, sodium chloride flush, sodium chloride flush  Assessment/Plan:  Active Problems:   Acute systolic heart failure (HCC)   Acute respiratory failure (HCC)   Encounter for palliative care   Goals of care, counseling/discussion   Cardiogenic shock (HCC)   AKI (acute kidney injury) (HCC)   Acute on chronic systolic (congestive) heart failure (HCC)    Acute systolic CHF with moderate to severe mitral regurgitation Ejection fraction is 25-30% based on recent echocardiogram. Heart failure team is following. Patient is on milrinone infusion. She was on amiodarone but was discontinued overnight due to trigeminy. Patient's creatinine is noted to be rising. Defer management to cardiology. Palliative medicine was also involved.  Acute on chronic kidney disease, stage III Creatinine is noted to be higher today compared to yesterday. This is predominantly cardiorenal. She does have some urine output, but not adequate. Continue to monitor renal function closely. I do not think that consulting nephrology will offer anything new. She definitely  does not appear to be a dialysis candidate.  Transaminitis Most likely due to shock liver. LFTs are stable. Continue to monitor periodically.  Chronic atrial fibrillation with RVR Heart rate is noted to be elevated this morning. Amiodarone was discontinued overnight due to arrhythmias. Cardiology to address this further. Chads 2 score is at least 6. She was on Apixaban at home. Currently on IV heparin.  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 Continue sliding scale coverage.  DVT Prophylaxis: On IV heparin    Code Status: Full code  Family Communication: Discussed with the patient  Disposition Plan: Await palliative medicine meeting with patient and family today.     LOS: 2 days   [facility]  [facility] Pager [phone] 01/27/2016, 1:01 PM  If 7PM-7AM, please contact night-coverage at [email or web address], password [facility]

## 2016-01-27 NOTE — Progress Notes (Signed)
ANTICOAGULATION CONSULT NOTE - Follow Up Consult  Pharmacy Consult for heparin Indication: atrial fibrillation   Labs:  Recent Labs  01/24/16 2314  01/25/16 0248 01/25/16 1500 01/25/16 2200  01/26/16 0445 01/26/16 1023 01/26/16 1200 01/26/16 1520 01/27/16 0333 01/27/16 0339  HGB 12.8  < >  --  9.9*  --   --  9.6*  --   --   --  9.5*  --   HCT 37.9  < >  --  30.1*  --   --  29.0*  --   --   --  28.4*  --   PLT 169  < >  --  182  --   --  179  --   --   --  201  --   APTT  --   --  35  --   --   < >  --  118*  --  101* 54*  --   LABPROT  --   --  30.6*  --   --   --   --   --   --   --   --   --   INR  --   --  3.00*  --   --   --   --   --   --   --   --   --   HEPARINUNFRC  --   --   --   --  1.34*  --   --   --  0.95*  --   --  0.79*  CREATININE 1.69*  --  1.77* 2.09*  --   --  2.18*  --   --   --  2.51*  --   TROPONINI 0.03  --  <0.03  --   --   --   --   --   --   --   --   --   < > = values in this interval not displayed.    Assessment: 82yo female now below heparin goal after rate change yesterday.  Goal of Therapy:  aPTT 66-102 seconds   Plan:  Will increase heparin gtt by ~1 unit/kg/hr to 600 units/hr and check PTT in 8hr.  Vernard Gambles, PharmD, BCPS  01/27/2016,5:45 AM

## 2016-01-27 NOTE — Progress Notes (Signed)
Daily Progress Note   Patient Name: Theresa Cortez       Date: 01/27/2016 DOB: [birth year]  Age: 82 y.o. MRN#: [id number] Attending Physician: Osvaldo Shipper, MD Primary Care Physician: Gwenyth Bender, MD Admit Date: 01/24/2016  Reason for Consultation/Follow-up: Establishing goals of care  Subjective:  awake alert resting in bed, she is in no distress, daughters are by her bedside.   Length of Stay: 2  Current Medications: Scheduled Meds:  . antiseptic oral rinse  7 mL Mouth Rinse BID  . famotidine  20 mg Oral QHS  . insulin aspart  0-9 Units Subcutaneous Q4H  . levothyroxine  88 mcg Oral QAC breakfast  . sodium chloride flush  10-40 mL Intracatheter Q12H  . sodium chloride flush  3 mL Intravenous Q12H    Continuous Infusions: . amiodarone Stopped (01/26/16 2140)  . amiodarone 60 mg/hr (01/27/16 0919)  . amiodarone    . dextrose 5 % and 0.45% NaCl 10 mL/hr at 01/25/16 0618  . heparin 600 Units/hr (01/27/16 0849)  . milrinone 0.125 mcg/kg/min (01/27/16 1138)    PRN Meds: sodium chloride, sodium chloride, sodium chloride flush, sodium chloride flush  Physical Exam         Awake alert S1S2 Clear Abdomen soft Edema Non focal  Vital Signs: BP 90/61 mmHg  Pulse 128  Temp(Src) 97.6 F (36.4 C) (Oral)  Resp 21  Ht 5\' 6"  (1.676 m)  Wt 79.6 kg (175 lb 7.8 oz)  BMI 28.34 kg/m2  SpO2 95% SpO2: SpO2: 95 % O2 Device: O2 Device: Not Delivered O2 Flow Rate: O2 Flow Rate (L/min): 1.5 L/min  Intake/output summary:  Intake/Output Summary (Last 24 hours) at 01/27/16 1307 Last data filed at 01/27/16 0919  Gross per 24 hour  Intake  724.7 ml  Output    640 ml  Net   84.7 ml   LBM:   Baseline Weight: Weight: 78.4 kg (172 lb 13.5 oz) Most recent weight: Weight: 79.6 kg (175 lb  7.8 oz)       Palliative Assessment/Data:    Flowsheet Rows        Most Recent Value   Intake Tab    Referral Department  Cardiology   Unit at Time of Referral  ICU   Palliative Care Primary Diagnosis  Cardiac   Palliative Care  Type  Return patient Palliative Care   Reason for referral  Clarify Goals of Care   Date first seen by Palliative Care  01/26/16   Clinical Assessment    Palliative Performance Scale Score  30%   Pain Max last 24 hours  3   Pain Min Last 24 hours  2   Dyspnea Max Last 24 Hours  5   Dyspnea Min Last 24 hours  4   Psychosocial & Spiritual Assessment    Palliative Care Outcomes    Patient/Family meeting held?  Yes   Who was at the meeting?  patient, 2 daughters.    Palliative Care follow-up planned  Yes, Facility      Patient Active Problem List   Diagnosis Date Noted  . Acute respiratory failure (HCC)   . Encounter for palliative care   . Goals of care, counseling/discussion   . Cardiogenic shock (HCC)   . AKI (acute kidney injury) (HCC)   . Acute on chronic systolic (congestive) heart failure (HCC)   . Acute systolic heart failure (HCC) 01/25/2016  . Left lower quadrant pain   . Interstitial lung disease (HCC)   . Dyspnea 01/13/2016  . Acute on chronic combined systolic (congestive) and diastolic (congestive) heart failure (HCC) 01/13/2016  . Cough 01/13/2016  . Cardiorenal disease 01/13/2016  . Nonischemic cardiomyopathy (HCC) 12/21/2015  . Coronary artery disease 12/21/2015  . Chronic renal insufficiency, stage IV (severe) 12/21/2015  . PAF (paroxysmal atrial fibrillation) (HCC) 12/21/2015  . HCAP (healthcare-associated pneumonia) 12/21/2015  . Chronic anticoagulation 12/21/2015  . Nausea with vomiting 12/20/2015  . Influenza B 12/20/2015  . Fluid overload 12/20/2015  . Chronic combined systolic and diastolic congestive heart failure (HCC) 12/20/2015  . Adnexal cyst - left 12/16/2015  . Protein-calorie malnutrition, moderate (HCC)  12/16/2015  . Abdominal pain   . Lactic acidosis   . HLD (hyperlipidemia) 12/15/2015  . Hypothyroidism 12/15/2015  . RA (rheumatoid arthritis) (HCC) 12/15/2015  . Hyperkalemia 12/15/2015  . Hyponatremia 12/15/2015  . Chronic atrial fibrillation (HCC) 12/15/2015  . Hypotension 12/15/2015  . Arterial hypotension   . Pain of upper abdomen   . Diabetes mellitus, type 2 (HCC) 12/06/2015  . Acute renal failure superimposed on stage 3 chronic kidney disease (HCC) 12/06/2015  . Atrial fibrillation with rapid ventricular response (HCC)   . ARF (acute renal failure) (HCC)   . Elevated LFTs   . Positive D dimer   . Mitral regurgitation   . Tricuspid regurgitation   . Pulmonary hypertension (HCC)   . Heart failure (HCC) 11/22/2015  . Acute congestive heart failure (HCC) 11/22/2015  . Hypertension 10/11/2012  . Cellulitis of knee, right 10/11/2012  . DM (diabetes mellitus) (HCC) 10/11/2012  . Dermatitis 10/11/2012    Palliative Care Assessment & Plan   Patient Profile:   82 yo lady with recurrent hospitalizations recently, CHF, ?ILD, AKI, underlying valvular abnormalities.   Assessment:  acute systolic CHF Cardiogenic shock Valvular abnormalities AKI  Recommendations/Plan:   FAMILY MEETING:   Extensive discussions with patient and her two daughters at the bedside:    1. DNR DNI   2. Discussed frankly that the patient is likely Hospice eligible, in light of her multiple co   Morbidities, briefly explored about whether the patient will be able to go home with   Hospice. How ever, patient wishes to go back to SNF rehab at [facility] to continue   rehab attempt. How ever, she is agreeable to palliative services through Hospice and  Palliative care of [place], [place] following her at [facility] rehab.    Code Status:    Code Status Orders        Start     Ordered   01/25/16 0222  Full code   Continuous     01/25/16 0226    Code Status History    Date Active Date Inactive Code  Status Order ID Comments User Context   01/13/2016  9:56 PM 01/18/2016 10:23 PM Full Code [id number]  Delano Metz, MD Inpatient   12/15/2015  8:04 PM 12/20/2015  5:34 PM Full Code [id number]  Lorretta Harp, MD ED   11/22/2015 11:46 PM 11/30/2015  2:13 PM Full Code [id number]  Clydie Braun, MD ED    Advance Directive Documentation        Most Recent Value   Type of Advance Directive  Out of facility DNR (pink MOST or yellow form)   Pre-existing out of facility DNR order (yellow form or pink MOST form)  Pink MOST form placed in chart (order not valid for inpatient use)   "MOST" Form in Place?         Prognosis:   < 6 months highly likely.   Discharge Planning:  Skilled Nursing Facility for rehab with Palliative care service follow-up Patient wishes to return back to [facility] and continue with her SNF rehab attempt.  Care plan was discussed with  Patient, 2 daughters.   Thank you for allowing the Palliative Medicine Team to assist in the care of this patient.   Time In: 12 Time Out: 1235 Total Time 35 Prolonged Time Billed  no       Greater than 50%  of this time was spent counseling and coordinating care related to the above assessment and plan.  Rosalin Hawking, MD [phone]  Please contact Palliative Medicine Team phone at [phone] for questions and concerns.

## 2016-01-28 DIAGNOSIS — I482 Chronic atrial fibrillation: Secondary | ICD-10-CM

## 2016-01-28 LAB — POCT I-STAT, CHEM 8
BUN: 43 mg/dL — AB (ref 6–20)
CHLORIDE: 88 mmol/L — AB (ref 101–111)
Calcium, Ion: 1.01 mmol/L — ABNORMAL LOW (ref 1.13–1.30)
Creatinine, Ser: 2.3 mg/dL — ABNORMAL HIGH (ref 0.44–1.00)
Glucose, Bld: 338 mg/dL — ABNORMAL HIGH (ref 65–99)
HCT: 32 % — ABNORMAL LOW (ref 36.0–46.0)
Hemoglobin: 10.9 g/dL — ABNORMAL LOW (ref 12.0–15.0)
POTASSIUM: 2.8 mmol/L — AB (ref 3.5–5.1)
SODIUM: 128 mmol/L — AB (ref 135–145)
TCO2: 24 mmol/L (ref 0–100)

## 2016-01-28 LAB — GLUCOSE, CAPILLARY
GLUCOSE-CAPILLARY: 269 mg/dL — AB (ref 65–99)
GLUCOSE-CAPILLARY: 252 mg/dL — AB (ref 65–99)
GLUCOSE-CAPILLARY: 43 mg/dL — AB (ref 65–99)
GLUCOSE-CAPILLARY: 114 mg/dL — AB (ref 65–99)
GLUCOSE-CAPILLARY: 90 mg/dL (ref 65–99)
Glucose-Capillary: 22 mg/dL — CL (ref 65–99)
Glucose-Capillary: 299 mg/dL — ABNORMAL HIGH (ref 65–99)
Glucose-Capillary: 32 mg/dL — CL (ref 65–99)
Glucose-Capillary: 43 mg/dL — CL (ref 65–99)

## 2016-01-28 LAB — CBC
HCT: 28.8 % — ABNORMAL LOW (ref 36.0–46.0)
Hemoglobin: 9.6 g/dL — ABNORMAL LOW (ref 12.0–15.0)
MCH: 30.8 pg (ref 26.0–34.0)
MCHC: 33.3 g/dL (ref 30.0–36.0)
MCV: 92.3 fL (ref 78.0–100.0)
Platelets: 203 10*3/uL (ref 150–400)
RBC: 3.12 MIL/uL — ABNORMAL LOW (ref 3.87–5.11)
RDW: 16.6 % — ABNORMAL HIGH (ref 11.5–15.5)
WBC: 6.8 10*3/uL (ref 4.0–10.5)

## 2016-01-28 LAB — CARBOXYHEMOGLOBIN
Carboxyhemoglobin: 0.9 % (ref 0.5–1.5)
Methemoglobin: 0.7 % (ref 0.0–1.5)
O2 Saturation: 49.1 %
Total hemoglobin: 13.8 g/dL (ref 12.0–16.0)

## 2016-01-28 LAB — HEPARIN LEVEL (UNFRACTIONATED): Heparin Unfractionated: 0.55 IU/mL (ref 0.30–0.70)

## 2016-01-28 LAB — BASIC METABOLIC PANEL
Anion gap: 11 (ref 5–15)
BUN: 57 mg/dL — ABNORMAL HIGH (ref 6–20)
CO2: 26 mmol/L (ref 22–32)
Calcium: 8.1 mg/dL — ABNORMAL LOW (ref 8.9–10.3)
Chloride: 88 mmol/L — ABNORMAL LOW (ref 101–111)
Creatinine, Ser: 2.75 mg/dL — ABNORMAL HIGH (ref 0.44–1.00)
GFR calc Af Amer: 17 mL/min — ABNORMAL LOW (ref >60)
GFR calc non Af Amer: 15 mL/min — ABNORMAL LOW (ref >60)
Glucose, Bld: 310 mg/dL — ABNORMAL HIGH (ref 65–99)
Potassium: 3.2 mmol/L — ABNORMAL LOW (ref 3.5–5.1)
Sodium: 125 mmol/L — ABNORMAL LOW (ref 135–145)

## 2016-01-28 LAB — GLUCOSE, RANDOM: GLUCOSE: 45 mg/dL — AB (ref 65–99)

## 2016-01-28 LAB — APTT: aPTT: 72 seconds — ABNORMAL HIGH (ref 24–37)

## 2016-01-28 MED ORDER — DEXTROSE 50 % IV SOLN
50.0000 mL | Freq: Once | INTRAVENOUS | Status: AC
  Administered 2016-01-28: 50 mL via INTRAVENOUS

## 2016-01-28 MED ORDER — DEXTROSE 50 % IV SOLN
INTRAVENOUS | Status: AC
  Administered 2016-01-28: 50 mL via INTRAVENOUS
  Filled 2016-01-28: qty 50

## 2016-01-28 MED ORDER — NOREPINEPHRINE BITARTRATE 1 MG/ML IV SOLN
0.0000 ug/min | INTRAVENOUS | Status: DC
  Administered 2016-01-28: 10 ug/min via INTRAVENOUS
  Filled 2016-01-28 (×4): qty 16

## 2016-01-28 MED ORDER — INSULIN ASPART 100 UNIT/ML ~~LOC~~ SOLN: 0.0000 [IU] | Freq: Three times a day (TID) | SUBCUTANEOUS | Status: DC

## 2016-01-28 MED ORDER — POTASSIUM CHLORIDE CRYS ER 20 MEQ PO TBCR
60.0000 meq | EXTENDED_RELEASE_TABLET | Freq: Once | ORAL | Status: AC
  Administered 2016-01-28: 60 meq via ORAL
  Filled 2016-01-28: qty 3

## 2016-01-28 MED ORDER — DEXTROSE 50 % IV SOLN
INTRAVENOUS | Status: AC
  Filled 2016-01-28: qty 50

## 2016-01-28 MED ORDER — FUROSEMIDE 10 MG/ML IJ SOLN
80.0000 mg | Freq: Three times a day (TID) | INTRAMUSCULAR | Status: DC
  Administered 2016-01-28 – 2016-02-01 (×12): 80 mg via INTRAVENOUS
  Filled 2016-01-28 (×12): qty 8

## 2016-01-28 NOTE — Progress Notes (Signed)
[facility] PROGRESS NOTE  Theresa Cortez MHD:[id number] DOB: Dec 05, 1929 DOA: 01/24/2016  PCP: Gwenyth Bender, MD  Brief HPI: 82 year old African-American female with a past medical history of systolic CHF, who was recently hospitalized for CHF exacerbation, presented with shortness of breath. She was admitted to the intensive care unit. Cardiology was consulted. Patient remains on inotropes. Clinically, she appears to be worsening.  Past medical history:  Past Medical History  Diagnosis Date  . Hypertension   . Wound infection (HCC)     Right knee   . Hypothyroidism   . History of diverticulosis   . Hyperlipidemia   . Type II diabetes mellitus (HCC)   . History of blood transfusion X 2    w/knee replacement  . Stroke [facility]) 2000    denies residual on 11/23/2015  . Rheumatoid arthritis(714.0)   . DJD (degenerative joint disease)   . Arthritis     "hands, arms" (11/23/2015)  . CKD (chronic kidney disease), stage III   . Chronic combined systolic and diastolic heart failure (HCC)   . Atrial fibrillation (HCC)   . Interstitial lung disease [facility])     Consultants: Cardiology  Procedures: Central line placement  Antibiotics: None  Subjective: Patient feels fatigued this morning. Denies any chest pain. Breathing is okay. Denies any nausea, vomiting.   Objective:  Vital Signs  Filed Vitals:   01/28/16 0300 01/28/16 0400 01/28/16 0500 01/28/16 0600  BP: 98/77 99/62 109/67 97/64  Pulse: 122 97 102 95  Temp:  97.7 F (36.5 C)    TempSrc:  Oral    Resp: 24 20 23 22   Height:      Weight:  78.8 kg (173 lb 11.6 oz)    SpO2: 92% 95% 85% 95%    Intake/Output Summary (Last 24 hours) at 01/28/16 0737 Last data filed at 01/28/16 0500  Gross per 24 hour  Intake 1628.36 ml  Output    573 ml  Net 1055.36 ml   Filed Weights   01/26/16 0452 01/27/16 0500 01/28/16 0400  Weight: 77.6 kg (171 lb 1.2 oz) 79.6 kg (175 lb 7.8 oz) 78.8 kg (173 lb 11.6 oz)    General  appearance: alert, cooperative, appears stated age and no distress Resp: Continues to have Diminished air entry at the bases with crackles bilaterally. No wheezing. No rhonchi. Cardio: S1, S2 is Regular. No S3, S4. Systolic murmur appreciated over the precordium. GI: soft, non-tender; bowel sounds normal; no masses,  no organomegaly Extremities: Minimal edema bilaterally in the lower extremities Neurologic: Awake and alert. Oriented 3. No focal neurological deficits.  Lab Results:  Data Reviewed: I have personally reviewed following labs and imaging studies  CBC:  Recent Labs Lab 01/24/16 2314 01/25/16 0245 01/25/16 1500 01/26/16 0445 01/27/16 0333 01/28/16 0400  WBC 6.4 11.3* 11.1* 9.2 6.0 6.8  NEUTROABS 4.9  --   --   --   --   --   HGB 12.8 10.4* 9.9* 9.6* 9.5* 9.6*  HCT 37.9 30.8* 30.1* 29.0* 28.4* 28.8*  MCV 97.2 98.7 95.0 93.5 93.1 92.3  PLT 169 224 182 179 201 203   Basic Metabolic Panel:  Recent Labs Lab 01/25/16 0245 01/25/16 0248 01/25/16 1500 01/26/16 0445 01/27/16 0333 01/28/16 0400  NA  --  136 134* 137 133* 125*  K  --  4.7 4.8 4.1 3.8 3.2*  CL  --  98* 96* 98* 95* 88*  CO2  --  15* 29 28 27 26   GLUCOSE  --  72 182* 106* 78 310*  BUN  --  43* 48* 52* 58* 57*  CREATININE  --  1.77* 2.09* 2.18* 2.51* 2.75*  CALCIUM  --  9.4 9.1 8.8* 8.4* 8.1*  MG 1.9  --   --  2.5* 2.3  --   PHOS 6.0*  --   --  4.3 3.9  --    GFR: Estimated Creatinine Clearance: 16.4 mL/min (by C-G formula based on Cr of 2.75).  Liver Function Tests:  Recent Labs Lab 01/24/16 2314 01/25/16 1500 01/27/16 0947  AST 126* 83* 56*  ALT 258* 199* 135*  ALKPHOS 173* 143* 131*  BILITOT 1.7* 1.7* 1.2  PROT 7.0 6.2* 5.8*  ALBUMIN 2.6* 2.1* 1.9*    Recent Labs Lab 01/24/16 2314 01/25/16 0248  LIPASE 30 26  AMYLASE  --  96   Coagulation Profile:  Recent Labs Lab 01/25/16 0248  INR 3.00*   Cardiac Enzymes:  Recent Labs Lab 01/24/16 2314 01/25/16 0248  TROPONINI  0.03 <0.03   CBG:  Recent Labs Lab 01/27/16 1233 01/27/16 1626 01/27/16 2047 01/28/16 0030 01/28/16 0401  GLUCAP 179* 189* 398* 299* 269*   Urine analysis:    Component Value Date/Time   COLORURINE AMBER* 01/24/2016 2118   APPEARANCEUR CLOUDY* 01/24/2016 2118   LABSPEC 1.018 01/24/2016 2118   PHURINE 5.0 01/24/2016 2118   GLUCOSEU NEGATIVE 01/24/2016 2118   HGBUR NEGATIVE 01/24/2016 2118   BILIRUBINUR NEGATIVE 01/24/2016 2118   KETONESUR NEGATIVE 01/24/2016 2118   PROTEINUR NEGATIVE 01/24/2016 2118   UROBILINOGEN 0.2 01/27/2011 1919   NITRITE NEGATIVE 01/24/2016 2118   LEUKOCYTESUR NEGATIVE 01/24/2016 2118    Recent Results (from the past 240 hour(s))  Urine culture     Status: None   Collection Time: 01/24/16  9:18 PM  Result Value Ref Range Status   Specimen Description URINE, CATHETERIZED  Final   Special Requests NONE  Final   Culture NO GROWTH Performed at [facility]   Final   Report Status 01/26/2016 FINAL  Final  Culture, blood (routine x 2)     Status: None (Preliminary result)   Collection Time: 01/25/16  2:48 AM  Result Value Ref Range Status   Specimen Description BLOOD LEFT HAND  Final   Special Requests IN PEDIATRIC BOTTLE 2CC  Final   Culture   Final    NO GROWTH 2 DAYS Performed at [facility]    Report Status PENDING  Incomplete      Radiology Studies: No results found.   Medications:  Scheduled: . antiseptic oral rinse  7 mL Mouth Rinse BID  . famotidine  20 mg Oral QHS  . insulin aspart  0-9 Units Subcutaneous Q4H  . levothyroxine  88 mcg Oral QAC breakfast  . sodium chloride flush  10-40 mL Intracatheter Q12H  . sodium chloride flush  3 mL Intravenous Q12H   Continuous: . amiodarone 60 mg/hr (01/28/16 0151)  . dextrose 5 % and 0.45% NaCl 10 mL/hr at 01/25/16 0618  . heparin 600 Units/hr (01/27/16 2000)  . milrinone 0.125 mcg/kg/min (01/27/16 2000)  . norepinephrine (LEVOPHED) Adult infusion 13 mcg/min (01/28/16  0708)   BPZ:WCHENI chloride, sodium chloride, sodium chloride flush, sodium chloride flush  Assessment/Plan:  Active Problems:   Acute systolic heart failure (HCC)   Acute respiratory failure (HCC)   Encounter for palliative care   Goals of care, counseling/discussion   Cardiogenic shock (HCC)   AKI (acute kidney injury) (HCC)   Acute on chronic systolic (congestive)  heart failure (HCC)    Acute systolic CHF with moderate to severe mitral regurgitation Ejection fraction is 25-30% based on recent echocardiogram. Heart failure team is following. Patient is on milrinone infusion. Amiodarone was resumed yesterday. She has also required norepinephrine infusion for low blood pressure. Patient's creatinine is rising. Heart failure team is managing. Palliative medicine was also involved.   Acute on chronic kidney disease, stage III/hyponatremia Creatinine continues to rise. She is also hyponatremic. This is predominantly cardiorenal. Urine output continues to be poor. Cardiology. 2. Resume diuretics today. Continue to monitor renal function closely. I do not think that consulting nephrology will offer anything new. She does not appear to be a dialysis candidate.  Transaminitis Most likely due to shock liver. LFTs are stable. Continue to monitor periodically.  Chronic atrial fibrillation with RVR Heart rate is better controlled today. Amiodarone infusion was resumed. Chads 2 score is at least 6. She was on Apixaban at home. Currently on IV heparin.  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 Continue sliding scale coverage.  DVT Prophylaxis: On IV heparin    Code Status: DO NOT RESUSCITATE  Family Communication: Discussed with the patient. Her long-term poor prognosis was discussed with her. She understands.  Disposition Plan: Management as per heart failure team. Patient and family struggling with the facts of patient's medical  condition and prognosis. Palliative medicine team is also following.    LOS: 3 days   [facility]  [facility] Pager [phone] 01/28/2016, 7:37 AM  If 7PM-7AM, please contact night-coverage at [email or web address], password [facility]

## 2016-01-28 NOTE — Progress Notes (Signed)
ANTICOAGULATION CONSULT NOTE Pharmacy Consult for heparin  Indication: Afib   Allergies  Allergen Reactions  . Penicillins Swelling    Has patient had a PCN reaction causing immediate rash, facial/tongue/throat swelling, SOB or lightheadedness with hypotension: unknown Has patient had a PCN reaction causing severe rash involving mucus membranes or skin necrosis: unknown Has patient had a PCN reaction that required hospitalization: unknown Has patient had a PCN reaction occurring within the last 10 years: unknown If all of the above answers are "NO", then may proceed with Cephalosporin use.     Patient Measurements: Height: 5\' 6"  (167.6 cm) Weight: 173 lb 11.6 oz (78.8 kg) IBW/kg (Calculated) : 59.3  Vital Signs: Temp: 97.6 F (36.4 C) (05/19 0800) Temp Source: Oral (05/19 0800) BP: 112/61 mmHg (05/19 1000) Pulse Rate: 56 (05/19 1000)  Labs:  Recent Labs  01/26/16 0445  01/26/16 1200  01/27/16 0333 01/27/16 0339 01/27/16 1529 01/28/16 0400 01/28/16 0413  HGB 9.6*  --   --   --  9.5*  --   --  9.6*  --   HCT 29.0*  --   --   --  28.4*  --   --  28.8*  --   PLT 179  --   --   --  201  --   --  203  --   APTT  --   < >  --   < > 54*  --  69* 72*  --   HEPARINUNFRC  --   --  0.95*  --   --  0.79*  --   --  0.55  CREATININE 2.18*  --   --   --  2.51*  --   --  2.75*  --   < > = values in this interval not displayed.  Estimated Creatinine Clearance: 16.4 mL/min (by C-G formula based on Cr of 2.75).   Assessment: 82 y.o. female admitted with CHF exacerbation, Eliquis PTA for Afib (last dose 5/15 AM), now on heparin.  Heparin drip 600 units/hr with aPTT and HL correlating at goal   Goal of Therapy:  Heparin level 0.3-0.7 units/ml Monitor platelets by anticoagulation protocol: Yes   Plan:  Continue heparin 600 unitts/hr Daily HL/CBC    6/15, PharmD Clinical Pharmacy Resident Pager # [phone] 01/28/2016 12:24 PM

## 2016-01-28 NOTE — Progress Notes (Signed)
Pt CBG fingerstick was showing 20's and 30's, blood pulled from the central line CVP port and glucose was showing 43 once and 343 the second time.  Lab draw ordered which came back at 43.  Pt given an amp of D50.  Recheck was done, but IV pump not paused, pt running dextrose through all other ports.  IV's paused and redrawn the specimine, the result was 114.  Spoke with the MD and he modified the sliding scale insulin to start coverage at CBG of 250.  Will pass on the oncoming shift that IV pumps must be paused and flushed prior to drawing the sample.    Eliane Decree, RN

## 2016-01-28 NOTE — Progress Notes (Signed)
Advanced Heart Failure Rounding Note   Subjective:    Started back on amio yesterday with recurrent Afib RVR. Diuretics held to let her equilibrate.   Slightly more short of breath this morning. Coox low. Denies CP, lightheadedness, or dizziness.   Still in AF but rate now in 6os.   I/O positive. Creatinine climbing 2.18 -> 2.51 -> 2.75.  Sodium 125 this am. K 3.2. Coox 49.1% on milrinone 0.125 mcg/kg/min  Objective:   Weight Range:  Vital Signs:   Temp:  [97.4 F (36.3 C)-98.7 F (37.1 C)] 97.7 F (36.5 C) (05/19 0400) Pulse Rate:  [60-133] 95 (05/19 0600) Resp:  [14-42] 22 (05/19 0600) BP: (71-109)/(44-77) 97/64 mmHg (05/19 0600) SpO2:  [85 %-99 %] 95 % (05/19 0600) Weight:  [173 lb 11.6 oz (78.8 kg)] 173 lb 11.6 oz (78.8 kg) (05/19 0400) Last BM Date:  (PTA)  Weight change: Filed Weights   01/26/16 0452 01/27/16 0500 01/28/16 0400  Weight: 171 lb 1.2 oz (77.6 kg) 175 lb 7.8 oz (79.6 kg) 173 lb 11.6 oz (78.8 kg)    Intake/Output:   Intake/Output Summary (Last 24 hours) at 01/28/16 0724 Last data filed at 01/28/16 0500  Gross per 24 hour  Intake 1628.36 ml  Output    573 ml  Net 1055.36 ml     Physical Exam: CVP ~18 , lying in bed, NAD  General: Chronically ill . No resp difficulty. On 2 liters  HEENT: normal Neck: supple. JVP to ear.  Carotids 2+ bilat; no bruits. No thyromegaly or nodule noted.  Cor: PMI nondisplaced. Irregular rate & rhythm. No rubs or 2/6 MR/TR  Loud s3 Lungs: Slight basilar crackles.  Abdomen: soft, NT, ND, no HSM. No bruits or masses. +BS  Extremities: no cyanosis, clubbing, rash, R and LLE 1+ edema SCDs on RLE and LLE  Neuro: alert & orientedx3, cranial nerves grossly intact. moves all 4 extremities w/o difficulty. Affect pleasant  Telemetry: A Fib 60s   Labs: Basic Metabolic Panel:  Recent Labs Lab 01/25/16 0245 01/25/16 0248 01/25/16 1500 01/26/16 0445 01/27/16 0333 01/28/16 0400  NA  --  136 134* 137 133* 125*    K  --  4.7 4.8 4.1 3.8 3.2*  CL  --  98* 96* 98* 95* 88*  CO2  --  15* '29 28 27 26  '$ GLUCOSE  --  72 182* 106* 78 310*  BUN  --  43* 48* 52* 58* 57*  CREATININE  --  1.77* 2.09* 2.18* 2.51* 2.75*  CALCIUM  --  9.4 9.1 8.8* 8.4* 8.1*  MG 1.9  --   --  2.5* 2.3  --   PHOS 6.0*  --   --  4.3 3.9  --     Liver Function Tests:  Recent Labs Lab 01/24/16 2314 01/25/16 1500 01/27/16 0947  AST 126* 83* 56*  ALT 258* 199* 135*  ALKPHOS 173* 143* 131*  BILITOT 1.7* 1.7* 1.2  PROT 7.0 6.2* 5.8*  ALBUMIN 2.6* 2.1* 1.9*    Recent Labs Lab 01/24/16 2314 01/25/16 0248  LIPASE 30 26  AMYLASE  --  96   No results for input(s): AMMONIA in the last 168 hours.  CBC:  Recent Labs Lab 01/24/16 2314 01/25/16 0245 01/25/16 1500 01/26/16 0445 01/27/16 0333 01/28/16 0400  WBC 6.4 11.3* 11.1* 9.2 6.0 6.8  NEUTROABS 4.9  --   --   --   --   --   HGB 12.8 10.4* 9.9* 9.6*  9.5* 9.6*  HCT 37.9 30.8* 30.1* 29.0* 28.4* 28.8*  MCV 97.2 98.7 95.0 93.5 93.1 92.3  PLT 169 224 182 179 201 203    Cardiac Enzymes:  Recent Labs Lab 01/24/16 2314 01/25/16 0248  TROPONINI 0.03 <0.03    BNP: BNP (last 3 results)  Recent Labs  01/14/16 1436 01/15/16 0523 01/24/16 2314  BNP 1245.4* 1386.8* 2194.5*    ProBNP (last 3 results) No results for input(s): PROBNP in the last 8760 hours.    Other results:  Imaging: No results found.   Medications:     Scheduled Medications: . antiseptic oral rinse  7 mL Mouth Rinse BID  . famotidine  20 mg Oral QHS  . insulin aspart  0-9 Units Subcutaneous Q4H  . levothyroxine  88 mcg Oral QAC breakfast  . sodium chloride flush  10-40 mL Intracatheter Q12H  . sodium chloride flush  3 mL Intravenous Q12H    Infusions: . amiodarone 60 mg/hr (01/28/16 0151)  . dextrose 5 % and 0.45% NaCl 10 mL/hr at 01/25/16 0618  . heparin 600 Units/hr (01/27/16 2000)  . milrinone 0.125 mcg/kg/min (01/27/16 2000)  . norepinephrine (LEVOPHED) Adult infusion  13 mcg/min (01/28/16 0708)    PRN Medications: sodium chloride, sodium chloride, sodium chloride flush, sodium chloride flush   Assessment:  1. Cardiogenic Shock 2. A/C Systolic Heart Failure  --EF 25-30% with NICM and severe MR 3. Acute A fib RVR-  4. Lactic Acidosis 5. CKD Stage IV 6. Elevated AST/ALT  7. Hypothyroidism 8. Hyperkalemia 9. Hypoalbuminemia- Albumin 2.6  10. Severe MR/TR   Plan/Discussion:     Over night amio stopped.  Back in A fib with variable rates on IV amio. Continue heparin.    Will try to get on schedule for DCCV this am.   CVP 18. Creatinine and electrolytes worse this morning.  Coox low on milrinone 0.125 mcg/kg/min. Will increase to 0.25. Will continue to hold lasix for now.  Patients clinical picture continues to worsen.    Urine still relatively clear.   No BB. No hydralazine/imdur with soft BP.   Met with Palliative 01/27/16. Pt is DNR/DNI, wishes to return to SNF for continued rehab attempt with Palliative following. She refuses hospice at this time.   Length of Stay: 3  Shirley Friar PA-C  01/28/2016, 7:24 AM  Advanced Heart Failure Team Pager [phone] (M-F; 7a - 4p)  Please contact [facility] for night-coverage after hours (4p -7a ) and weekends on [email or web address]  Patient seen and examined with Oda Kilts, PA-C. We discussed all aspects of the encounter. I agree with the assessment and plan as stated above.   She has worsening shock with low cardiac output, volume overload and progressive renal failure. Will increase milrinone. Restart IV diuretics. Will try to arrange DC-CV today.   Appreciate Palliative input. She is now DNR/DNI. Not candidate for CVVHD, home inotropes or mechanical support.   I had a long talk with multiple family members in the room today and they are now leaning toward comfort care but have not made final decision. They will discuss amongst themselves and let us know this afternoon. Hold off on DC-CV  for now.   The patient is critically ill with multiple organ systems failure and requires high complexity decision making for assessment and support, frequent evaluation and titration of therapies, application of advanced monitoring technologies and extensive interpretation of multiple databases.   Critical Care Time devoted to patient care services described in this note is  35 Minutes.      Bensimhon, Daniel,MD 10:31 AM

## 2016-01-29 DIAGNOSIS — R740 Nonspecific elevation of levels of transaminase and lactic acid dehydrogenase [LDH]: Secondary | ICD-10-CM

## 2016-01-29 LAB — BASIC METABOLIC PANEL
ANION GAP: 12 (ref 5–15)
BUN: 54 mg/dL — ABNORMAL HIGH (ref 6–20)
CALCIUM: 8.7 mg/dL — AB (ref 8.9–10.3)
CO2: 26 mmol/L (ref 22–32)
CREATININE: 2.64 mg/dL — AB (ref 0.44–1.00)
Chloride: 92 mmol/L — ABNORMAL LOW (ref 101–111)
GFR calc Af Amer: 18 mL/min — ABNORMAL LOW (ref >60)
GFR, EST NON AFRICAN AMERICAN: 16 mL/min — AB (ref >60)
GLUCOSE: 107 mg/dL — AB (ref 65–99)
POTASSIUM: 4.8 mmol/L (ref 3.5–5.1)
Sodium: 130 mmol/L — ABNORMAL LOW (ref 135–145)

## 2016-01-29 LAB — CBC
HCT: 28.8 % — ABNORMAL LOW (ref 36.0–46.0)
HEMOGLOBIN: 9.9 g/dL — AB (ref 12.0–15.0)
MCH: 30.9 pg (ref 26.0–34.0)
MCHC: 34.4 g/dL (ref 30.0–36.0)
MCV: 90 fL (ref 78.0–100.0)
PLATELETS: 200 10*3/uL (ref 150–400)
RBC: 3.2 MIL/uL — AB (ref 3.87–5.11)
RDW: 16.5 % — ABNORMAL HIGH (ref 11.5–15.5)
WBC: 5.9 10*3/uL (ref 4.0–10.5)

## 2016-01-29 LAB — HEPARIN LEVEL (UNFRACTIONATED): HEPARIN UNFRACTIONATED: 0.35 [IU]/mL (ref 0.30–0.70)

## 2016-01-29 LAB — GLUCOSE, CAPILLARY
GLUCOSE-CAPILLARY: 107 mg/dL — AB (ref 65–99)
GLUCOSE-CAPILLARY: 233 mg/dL — AB (ref 65–99)
GLUCOSE-CAPILLARY: 88 mg/dL (ref 65–99)
Glucose-Capillary: 120 mg/dL — ABNORMAL HIGH (ref 65–99)
Glucose-Capillary: 135 mg/dL — ABNORMAL HIGH (ref 65–99)
Glucose-Capillary: 215 mg/dL — ABNORMAL HIGH (ref 65–99)
Glucose-Capillary: 195 mg/dL — ABNORMAL HIGH (ref 65–99)

## 2016-01-29 LAB — CARBOXYHEMOGLOBIN
CARBOXYHEMOGLOBIN: 1.1 % (ref 0.5–1.5)
METHEMOGLOBIN: 0.7 % (ref 0.0–1.5)
O2 SAT: 76.7 %
TOTAL HEMOGLOBIN: 14.7 g/dL (ref 12.0–16.0)

## 2016-01-29 MED ORDER — ONDANSETRON HCL 4 MG/2ML IJ SOLN
4.0000 mg | Freq: Once | INTRAMUSCULAR | Status: AC
Start: 2016-01-29 — End: 2016-01-29
  Administered 2016-01-29: 4 mg via INTRAVENOUS
  Filled 2016-01-29: qty 2

## 2016-01-29 NOTE — Progress Notes (Signed)
ANTICOAGULATION CONSULT NOTE Pharmacy Consult for heparin  Indication: Afib   Allergies  Allergen Reactions  . Penicillins Swelling    Has patient had a PCN reaction causing immediate rash, facial/tongue/throat swelling, SOB or lightheadedness with hypotension: unknown Has patient had a PCN reaction causing severe rash involving mucus membranes or skin necrosis: unknown Has patient had a PCN reaction that required hospitalization: unknown Has patient had a PCN reaction occurring within the last 10 years: unknown If all of the above answers are "NO", then may proceed with Cephalosporin use.     Patient Measurements: Height: 5\' 6"  (167.6 cm) Weight: 177 lb 11.1 oz (80.6 kg) IBW/kg (Calculated) : 59.3  Vital Signs: Temp: 98.4 F (36.9 C) (05/20 0530) Temp Source: Oral (05/20 0530) BP: 101/68 mmHg (05/20 0800) Pulse Rate: 123 (05/20 0800)  Labs:  Recent Labs  01/27/16 0333 01/27/16 0339 01/27/16 1529 01/28/16 0400 01/28/16 0413 01/28/16 1300 01/29/16 0502 01/29/16 0503  HGB 9.5*  --   --  9.6*  --  10.9* 9.9*  --   HCT 28.4*  --   --  28.8*  --  32.0* 28.8*  --   PLT 201  --   --  203  --   --  200  --   APTT 54*  --  69* 72*  --   --   --   --   HEPARINUNFRC  --  0.79*  --   --  0.55  --   --  0.35  CREATININE 2.51*  --   --  2.75*  --  2.30* 2.64*  --     Estimated Creatinine Clearance: 17.3 mL/min (by C-G formula based on Cr of 2.64).   Assessment: 82 y.o. female admitted with CHF exacerbation, Eliquis PTA for Afib (last dose 5/15 AM), now on heparin. Heparin drip 600 units/hr with aPTT and HL correlating. HL at goal at 0.35. Hgb today 9.9 down from 10.9 yesterday. PLT 200.   Goal of Therapy:  Heparin level 0.3-0.7 units/ml Monitor platelets by anticoagulation protocol: Yes   Plan:  Continue heparin 600 unitts/hr Daily HL/CBC    6/15, PharmD Clinical Pharmacy Resident Pager: [phone] 01/29/2016 8:11 AM

## 2016-01-29 NOTE — Progress Notes (Signed)
Advanced Heart Failure Rounding Note   Subjective:    Remains on norepi 12 and milrinone 0.25. Co-ox improved 76%.CVP 16   Weight continues to climb. Creatinine stable 2.18 -> 2.51 -> 2.75 -> 2.6.  Still in AF. On amio but rate back up 100-105   Objective:   Weight Range:  Vital Signs:   Temp:  [93.2 F (34 C)-98.4 F (36.9 C)] 97.8 F (36.6 C) (05/20 0800) Pulse Rate:  [31-127] 113 (05/20 1200) Resp:  [10] 10 (05/19 1246) BP: (79-125)/(53-88) 98/59 mmHg (05/20 1200) SpO2:  [92 %-100 %] 96 % (05/20 1200) Weight:  [80.6 kg (177 lb 11.1 oz)] 80.6 kg (177 lb 11.1 oz) (05/20 0600) Last BM Date:  (PTA)  Weight change: Filed Weights   01/27/16 0500 01/28/16 0400 01/29/16 0600  Weight: 79.6 kg (175 lb 7.8 oz) 78.8 kg (173 lb 11.6 oz) 80.6 kg (177 lb 11.1 oz)    Intake/Output:   Intake/Output Summary (Last 24 hours) at 01/29/16 1203 Last data filed at 01/29/16 1100  Gross per 24 hour  Intake 1397.65 ml  Output   2450 ml  Net -1052.35 ml     Physical Exam: CVP ~16 , lying in bed, NAD  General: Chronically ill . No resp difficulty. On 2 liters  HEENT: normal Neck: supple. JVP to ear.  Carotids 2+ bilat; no bruits. No thyromegaly or nodule noted.  Cor: PMI nondisplaced. Irregular rate & rhythm. No rubs or 2/6 MR/TR  Loud s3 Lungs: Slight basilar crackles.  Abdomen: soft, NT, ND, no HSM. No bruits or masses. +BS  Extremities: no cyanosis, clubbing, rash, R and LLE 1-2+ edema SCDs on RLE and LLE  Neuro: alert & orientedx3, cranial nerves grossly intact. moves all 4 extremities w/o difficulty. Affect pleasant  Telemetry: A Fib 100-105   Labs: Basic Metabolic Panel:  Recent Labs Lab 01/25/16 0245  01/25/16 1500 01/26/16 0445 01/27/16 0333 01/28/16 0400 01/28/16 1300 01/28/16 1356 01/29/16 0502  NA  --   < > 134* 137 133* 125* 128*  --  130*  K  --   < > 4.8 4.1 3.8 3.2* 2.8*  --  4.8  CL  --   < > 96* 98* 95* 88* 88*  --  92*  CO2  --   < > 29 28 27 26    --   --  26  GLUCOSE  --   < > 182* 106* 78 310* 338* 45* 107*  BUN  --   < > 48* 52* 58* 57* 43*  --  54*  CREATININE  --   < > 2.09* 2.18* 2.51* 2.75* 2.30*  --  2.64*  CALCIUM  --   < > 9.1 8.8* 8.4* 8.1*  --   --  8.7*  MG 1.9  --   --  2.5* 2.3  --   --   --   --   PHOS 6.0*  --   --  4.3 3.9  --   --   --   --   < > = values in this interval not displayed.  Liver Function Tests:  Recent Labs Lab 01/24/16 2314 01/25/16 1500 01/27/16 0947  AST 126* 83* 56*  ALT 258* 199* 135*  ALKPHOS 173* 143* 131*  BILITOT 1.7* 1.7* 1.2  PROT 7.0 6.2* 5.8*  ALBUMIN 2.6* 2.1* 1.9*    Recent Labs Lab 01/24/16 2314 01/25/16 0248  LIPASE 30 26  AMYLASE  --  96  No results for input(s): AMMONIA in the last 168 hours.  CBC:  Recent Labs Lab 01/24/16 2314  01/25/16 1500 01/26/16 0445 01/27/16 0333 01/28/16 0400 01/28/16 1300 01/29/16 0502  WBC 6.4  < > 11.1* 9.2 6.0 6.8  --  5.9  NEUTROABS 4.9  --   --   --   --   --   --   --   HGB 12.8  < > 9.9* 9.6* 9.5* 9.6* 10.9* 9.9*  HCT 37.9  < > 30.1* 29.0* 28.4* 28.8* 32.0* 28.8*  MCV 97.2  < > 95.0 93.5 93.1 92.3  --  90.0  PLT 169  < > 182 179 201 203  --  200  < > = values in this interval not displayed.  Cardiac Enzymes:  Recent Labs Lab 01/24/16 2314 01/25/16 0248  TROPONINI 0.03 <0.03    BNP: BNP (last 3 results)  Recent Labs  01/14/16 1436 01/15/16 0523 01/24/16 2314  BNP 1245.4* 1386.8* 2194.5*    ProBNP (last 3 results) No results for input(s): PROBNP in the last 8760 hours.    Other results:  Imaging: No results found.   Medications:     Scheduled Medications: . antiseptic oral rinse  7 mL Mouth Rinse BID  . famotidine  20 mg Oral QHS  . furosemide  80 mg Intravenous Q8H  . insulin aspart  0-5 Units Subcutaneous TID WC  . levothyroxine  88 mcg Oral QAC breakfast  . sodium chloride flush  10-40 mL Intracatheter Q12H  . sodium chloride flush  3 mL Intravenous Q12H    Infusions: .  amiodarone 60 mg/hr (01/29/16 0709)  . dextrose 5 % and 0.45% NaCl Stopped (01/28/16 0700)  . heparin 600 Units/hr (01/29/16 0258)  . milrinone 0.25 mcg/kg/min (01/29/16 0603)  . norepinephrine (LEVOPHED) Adult infusion 13 mcg/min (01/29/16 0538)    PRN Medications: sodium chloride, sodium chloride, sodium chloride flush, sodium chloride flush   Assessment:  1. Cardiogenic Shock 2. A/C Systolic Heart Failure  --EF 25-30% with NICM and severe MR 3. Acute A fib RVR-  4. Lactic Acidosis 5. CKD Stage IV 6. Elevated AST/ALT  7. Hypothyroidism 8. Hyperkalemia 9. Hypoalbuminemia- Albumin 2.6  10. Severe MR/TR   Plan/Discussion:    Co-ox improved this am on dual pressors but CVP continues to climb. Renal function elevated but stable today.   She now has end-stage HF with very poor prognosis. I discussed with her and her family that options are to continue inotrope support with diuresis for 1-2 more days and then try to wean inotropes or switching to comfort care now. They will continue to discuss.   Will continue IV diuresis today with inotrope support. Continue amio for AF. Family not interested in DC-CV.  Appreciate Palliative input. She is now DNR/DNI. Not candidate for CVVHD, home inotropes or mechanical support.    The patient is critically ill with multiple organ systems failure and requires high complexity decision making for assessment and support, frequent evaluation and titration of therapies, application of advanced monitoring technologies and extensive interpretation of multiple databases.   Critical Care Time devoted to patient care services described in this note is 35 Minutes.  Length of Stay: 4  Bensimhon, Daniel MD  01/29/2016, 12:03 PM  Advanced Heart Failure Team Pager [phone] (M-F; 7a - 4p)  Please contact [facility] for night-coverage after hours (4p -7a ) and weekends on [email or web address]

## 2016-01-29 NOTE — Progress Notes (Signed)
[facility] PROGRESS NOTE  LYNNZEE STANISLAW OXB:[id number] DOB: 1929-12-27 DOA: 01/24/2016  PCP: Gwenyth Bender, MD  Brief HPI: 82 year old African-American female with a past medical history of systolic CHF, who was recently hospitalized for CHF exacerbation, presented with shortness of breath. She was admitted to the intensive care unit. Cardiology was consulted. Patient remains on inotropes. Clinically, she appears to be worsening.  Past medical history:  Past Medical History  Diagnosis Date  . Hypertension   . Wound infection (HCC)     Right knee   . Hypothyroidism   . History of diverticulosis   . Hyperlipidemia   . Type II diabetes mellitus (HCC)   . History of blood transfusion X 2    w/knee replacement  . Stroke [facility]) 2000    denies residual on 11/23/2015  . Rheumatoid arthritis(714.0)   . DJD (degenerative joint disease)   . Arthritis     "hands, arms" (11/23/2015)  . CKD (chronic kidney disease), stage III   . Chronic combined systolic and diastolic heart failure (HCC)   . Atrial fibrillation (HCC)   . Interstitial lung disease (HCC)     Consultants: Cardiology/Heart failure team  Procedures: PICC line placement  Antibiotics: None  Subjective: Patient feels nauseated this morning. Had 2 episodes of vomiting. No abdominal pain. Denies any chest pain. Breathing is okay. No other complaints offered.   Objective:  Vital Signs  Filed Vitals:   01/29/16 0647 01/29/16 0700 01/29/16 0800 01/29/16 0900  BP:  98/64 101/68 103/65  Pulse: 119 114 123 122  Temp:   97.8 F (36.6 C)   TempSrc:   Oral   Resp:      Height:      Weight:      SpO2: 98% 98% 100% 97%    Intake/Output Summary (Last 24 hours) at 01/29/16 1054 Last data filed at 01/29/16 0900  Gross per 24 hour  Intake 1385.85 ml  Output   2150 ml  Net -764.15 ml   Filed Weights   01/27/16 0500 01/28/16 0400 01/29/16 0600  Weight: 79.6 kg (175 lb 7.8 oz) 78.8 kg (173 lb 11.6 oz) 80.6 kg (177 lb  11.1 oz)    General appearance: alert, cooperative, appears stated age and no distress Resp: Diminished air entry at the bases with crackles bilaterally. No wheezing. No rhonchi. Cardio: S1, S2 is Regular. No S3, S4. Systolic murmur appreciated over the precordium. GI: soft, non-tender; bowel sounds normal; no masses,  no organomegaly Extremities: Minimal edema bilaterally in the lower extremities Neurologic: Awake and alert. Oriented 3. No focal neurological deficits.  Lab Results:  Data Reviewed: I have personally reviewed following labs and imaging studies  CBC:  Recent Labs Lab 01/24/16 2314  01/25/16 1500 01/26/16 0445 01/27/16 0333 01/28/16 0400 01/28/16 1300 01/29/16 0502  WBC 6.4  < > 11.1* 9.2 6.0 6.8  --  5.9  NEUTROABS 4.9  --   --   --   --   --   --   --   HGB 12.8  < > 9.9* 9.6* 9.5* 9.6* 10.9* 9.9*  HCT 37.9  < > 30.1* 29.0* 28.4* 28.8* 32.0* 28.8*  MCV 97.2  < > 95.0 93.5 93.1 92.3  --  90.0  PLT 169  < > 182 179 201 203  --  200  < > = values in this interval not displayed. Basic Metabolic Panel:  Recent Labs Lab 01/25/16 0245  01/25/16 1500 01/26/16 0445 01/27/16 0333 01/28/16 0400  01/28/16 1300 01/28/16 1356 01/29/16 0502  NA  --   < > 134* 137 133* 125* 128*  --  130*  K  --   < > 4.8 4.1 3.8 3.2* 2.8*  --  4.8  CL  --   < > 96* 98* 95* 88* 88*  --  92*  CO2  --   < > 29 28 27 26   --   --  26  GLUCOSE  --   < > 182* 106* 78 310* 338* 45* 107*  BUN  --   < > 48* 52* 58* 57* 43*  --  54*  CREATININE  --   < > 2.09* 2.18* 2.51* 2.75* 2.30*  --  2.64*  CALCIUM  --   < > 9.1 8.8* 8.4* 8.1*  --   --  8.7*  MG 1.9  --   --  2.5* 2.3  --   --   --   --   PHOS 6.0*  --   --  4.3 3.9  --   --   --   --   < > = values in this interval not displayed. GFR: Estimated Creatinine Clearance: 17.3 mL/min (by C-G formula based on Cr of 2.64).  Liver Function Tests:  Recent Labs Lab 01/24/16 2314 01/25/16 1500 01/27/16 0947  AST 126* 83* 56*  ALT  258* 199* 135*  ALKPHOS 173* 143* 131*  BILITOT 1.7* 1.7* 1.2  PROT 7.0 6.2* 5.8*  ALBUMIN 2.6* 2.1* 1.9*    Recent Labs Lab 01/24/16 2314 01/25/16 0248  LIPASE 30 26  AMYLASE  --  96   Coagulation Profile:  Recent Labs Lab 01/25/16 0248  INR 3.00*   Cardiac Enzymes:  Recent Labs Lab 01/24/16 2314 01/25/16 0248  TROPONINI 0.03 <0.03   CBG:  Recent Labs Lab 01/28/16 2052 01/29/16 0029 01/29/16 0504 01/29/16 0744 01/29/16 0757  GLUCAP 90 88 107* 120* 135*   Urine analysis:    Component Value Date/Time   COLORURINE AMBER* 01/24/2016 2118   APPEARANCEUR CLOUDY* 01/24/2016 2118   LABSPEC 1.018 01/24/2016 2118   PHURINE 5.0 01/24/2016 2118   GLUCOSEU NEGATIVE 01/24/2016 2118   HGBUR NEGATIVE 01/24/2016 2118   BILIRUBINUR NEGATIVE 01/24/2016 2118   KETONESUR NEGATIVE 01/24/2016 2118   PROTEINUR NEGATIVE 01/24/2016 2118   UROBILINOGEN 0.2 01/27/2011 1919   NITRITE NEGATIVE 01/24/2016 2118   LEUKOCYTESUR NEGATIVE 01/24/2016 2118    Recent Results (from the past 240 hour(s))  Urine culture     Status: None   Collection Time: 01/24/16  9:18 PM  Result Value Ref Range Status   Specimen Description URINE, CATHETERIZED  Final   Special Requests NONE  Final   Culture NO GROWTH Performed at [facility]   Final   Report Status 01/26/2016 FINAL  Final  Culture, blood (routine x 2)     Status: None (Preliminary result)   Collection Time: 01/25/16  2:48 AM  Result Value Ref Range Status   Specimen Description BLOOD LEFT HAND  Final   Special Requests IN PEDIATRIC BOTTLE 2CC  Final   Culture   Final    NO GROWTH 4 DAYS Performed at [facility]    Report Status PENDING  Incomplete      Radiology Studies: No results found.   Medications:  Scheduled: . antiseptic oral rinse  7 mL Mouth Rinse BID  . famotidine  20 mg Oral QHS  . furosemide  80 mg Intravenous Q8H  . insulin  aspart  0-5 Units Subcutaneous TID WC  . levothyroxine  88 mcg  Oral QAC breakfast  . sodium chloride flush  10-40 mL Intracatheter Q12H  . sodium chloride flush  3 mL Intravenous Q12H   Continuous: . amiodarone 60 mg/hr (01/29/16 0709)  . dextrose 5 % and 0.45% NaCl Stopped (01/28/16 0700)  . heparin 600 Units/hr (01/29/16 0258)  . milrinone 0.25 mcg/kg/min (01/29/16 0603)  . norepinephrine (LEVOPHED) Adult infusion 13 mcg/min (01/29/16 0538)   IWL:NLGXQJ chloride, sodium chloride, sodium chloride flush, sodium chloride flush  Assessment/Plan:  Active Problems:   Acute systolic heart failure (HCC)   Acute respiratory failure (HCC)   Encounter for palliative care   Goals of care, counseling/discussion   Cardiogenic shock (HCC)   AKI (acute kidney injury) (HCC)   Acute on chronic systolic (congestive) heart failure (HCC)    Acute systolic CHF with moderate to severe mitral regurgitation Ejection fraction is 25-30% based on recent echocardiogram. Heart failure team is following. Patient is on milrinone infusion. She is back on Amiodarone. She has also required norepinephrine infusion for low blood pressure. Patient's creatinine is higher than baseline. Heart failure team is managing. Palliative medicine was also involved.   Acute on chronic kidney disease, stage III/hyponatremia Creatinine continues to rise. She is also hyponatremic. This is predominantly cardiorenal. Urine output has improved yesterday with initiation of diuretics. Continue to monitor renal function closely. I do not think that consulting nephrology will offer anything more to this patient. She does not appear to be a dialysis candidate.  Transaminitis Most likely due to shock liver. LFTs are stable. Continue to monitor periodically.  Chronic atrial fibrillation with RVR Heart rate remains elevated. Amiodarone infusion was resumed. Chads 2 score is at least 6. She was on Apixaban at home. Currently on IV heparin.  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 Continue sliding scale coverage.  DVT Prophylaxis: On IV heparin    Code Status: DO NOT RESUSCITATE  Family Communication: Discussed with the patient. Her long-term poor prognosis was discussed with her. She understands.  Disposition Plan: Management as per heart failure team. Patient and family struggling with the facts of patient's medical condition and prognosis. Palliative medicine team is also following.    LOS: 4 days   [facility]  [facility] Pager [phone] 01/29/2016, 10:54 AM  If 7PM-7AM, please contact night-coverage at [email or web address], password [facility]

## 2016-01-29 NOTE — Progress Notes (Signed)
RN notified that CVP replacement transducer and set up is available for placement.

## 2016-01-29 NOTE — Progress Notes (Addendum)
Patient had episode of feeling sick this morning, with dry heaving and vomited small amount of brownish  Vomitus with thick secretions. Dr. Duwayne Heck was paged and notify with orders made zofran IV 4mg . x1 given with some relief.

## 2016-01-30 LAB — CARBOXYHEMOGLOBIN
CARBOXYHEMOGLOBIN: 0.9 % (ref 0.5–1.5)
METHEMOGLOBIN: 0.9 % (ref 0.0–1.5)
O2 Saturation: 53.4 %
Total hemoglobin: 10 g/dL — ABNORMAL LOW (ref 12.0–16.0)

## 2016-01-30 LAB — CULTURE, BLOOD (ROUTINE X 2): Culture: NO GROWTH

## 2016-01-30 LAB — BASIC METABOLIC PANEL
Anion gap: 13 (ref 5–15)
BUN: 52 mg/dL — ABNORMAL HIGH (ref 6–20)
CHLORIDE: 89 mmol/L — AB (ref 101–111)
CO2: 25 mmol/L (ref 22–32)
CREATININE: 2.71 mg/dL — AB (ref 0.44–1.00)
Calcium: 8.3 mg/dL — ABNORMAL LOW (ref 8.9–10.3)
GFR calc non Af Amer: 15 mL/min — ABNORMAL LOW (ref >60)
GFR, EST AFRICAN AMERICAN: 18 mL/min — AB (ref >60)
Glucose, Bld: 273 mg/dL — ABNORMAL HIGH (ref 65–99)
POTASSIUM: 4 mmol/L (ref 3.5–5.1)
SODIUM: 127 mmol/L — AB (ref 135–145)

## 2016-01-30 LAB — CBC
HEMATOCRIT: 27.9 % — AB (ref 36.0–46.0)
Hemoglobin: 9.5 g/dL — ABNORMAL LOW (ref 12.0–15.0)
MCH: 30.9 pg (ref 26.0–34.0)
MCHC: 34.1 g/dL (ref 30.0–36.0)
MCV: 90.9 fL (ref 78.0–100.0)
PLATELETS: 183 10*3/uL (ref 150–400)
RBC: 3.07 MIL/uL — AB (ref 3.87–5.11)
RDW: 16.4 % — ABNORMAL HIGH (ref 11.5–15.5)
WBC: 6.3 10*3/uL (ref 4.0–10.5)

## 2016-01-30 LAB — GLUCOSE, CAPILLARY
GLUCOSE-CAPILLARY: 170 mg/dL — AB (ref 65–99)
GLUCOSE-CAPILLARY: 184 mg/dL — AB (ref 65–99)
Glucose-Capillary: 162 mg/dL — ABNORMAL HIGH (ref 65–99)
Glucose-Capillary: 169 mg/dL — ABNORMAL HIGH (ref 65–99)
Glucose-Capillary: 168 mg/dL — ABNORMAL HIGH (ref 65–99)

## 2016-01-30 LAB — HEPARIN LEVEL (UNFRACTIONATED)
HEPARIN UNFRACTIONATED: 0.28 [IU]/mL — AB (ref 0.30–0.70)
HEPARIN UNFRACTIONATED: 0.35 [IU]/mL (ref 0.30–0.70)

## 2016-01-30 MED ORDER — METOLAZONE 5 MG PO TABS
5.0000 mg | ORAL_TABLET | Freq: Every day | ORAL | Status: DC
  Administered 2016-01-30 – 2016-01-31 (×2): 5 mg via ORAL
  Filled 2016-01-30 (×2): qty 1

## 2016-01-30 NOTE — Progress Notes (Signed)
[facility] PROGRESS NOTE  Theresa Cortez KXF:[id number] DOB: [birth year] DOA: 01/24/2016  PCP: Gwenyth Bender, MD  Brief HPI: 82 year old African-American female with a past medical history of systolic CHF, who was recently hospitalized for CHF exacerbation, presented with shortness of breath. She was admitted to the intensive care unit. Cardiology was consulted. Patient remains on inotropes. Clinically, she appears to be worsening. She remains on inotropes and vasopressors. Cardiology discussing comfort care with patient and family on a regular basis.  Past medical history:  Past Medical History  Diagnosis Date  . Hypertension   . Wound infection (HCC)     Right knee   . Hypothyroidism   . History of diverticulosis   . Hyperlipidemia   . Type II diabetes mellitus (HCC)   . History of blood transfusion X 2    w/knee replacement  . Stroke [facility]) 2000    denies residual on 11/23/2015  . Rheumatoid arthritis(714.0)   . DJD (degenerative joint disease)   . Arthritis     "hands, arms" (11/23/2015)  . CKD (chronic kidney disease), stage III   . Chronic combined systolic and diastolic heart failure (HCC)   . Atrial fibrillation (HCC)   . Interstitial lung disease (HCC)     Consultants: Cardiology/Heart failure team  Procedures: PICC line placement  Antibiotics: None  Subjective: Patient feels well this morning. She denies any chest pain or shortness of breath. No nausea or vomiting today.   Objective:  Vital Signs  Filed Vitals:   01/30/16 0447 01/30/16 0500 01/30/16 0600 01/30/16 0700  BP:  108/63 102/66 97/70  Pulse:  92 89 108  Temp:      TempSrc:      Resp:      Height:      Weight: 82 kg (180 lb 12.4 oz)     SpO2:  100% 100% 99%    Intake/Output Summary (Last 24 hours) at 01/30/16 0844 Last data filed at 01/30/16 0735  Gross per 24 hour  Intake 2463.44 ml  Output   1620 ml  Net 843.44 ml   Filed Weights   01/28/16 0400 01/29/16 0600 01/30/16 0447    Weight: 78.8 kg (173 lb 11.6 oz) 80.6 kg (177 lb 11.1 oz) 82 kg (180 lb 12.4 oz)    General appearance: alert, cooperative, appears stated age and no distress Resp: Continues to poor air entry at the bases with crackles bilaterally. No wheezing. No rhonchi. Cardio: S1, S2 is Regular. No S3, S4. Systolic murmur appreciated over the precordium. GI: soft, non-tender; bowel sounds normal; no masses,  no organomegaly Extremities: Minimal edema bilaterally in the lower extremities Neurologic: Awake and alert. Oriented 3. No focal neurological deficits.  Lab Results:  Data Reviewed: I have personally reviewed following labs and imaging studies  CBC:  Recent Labs Lab 01/24/16 2314  01/26/16 0445 01/27/16 0333 01/28/16 0400 01/28/16 1300 01/29/16 0502 01/30/16 0415  WBC 6.4  < > 9.2 6.0 6.8  --  5.9 6.3  NEUTROABS 4.9  --   --   --   --   --   --   --   HGB 12.8  < > 9.6* 9.5* 9.6* 10.9* 9.9* 9.5*  HCT 37.9  < > 29.0* 28.4* 28.8* 32.0* 28.8* 27.9*  MCV 97.2  < > 93.5 93.1 92.3  --  90.0 90.9  PLT 169  < > 179 201 203  --  200 183  < > = values in this interval not displayed.  Basic Metabolic Panel:  Recent Labs Lab 01/25/16 0245  01/26/16 0445 01/27/16 0333 01/28/16 0400 01/28/16 1300 01/28/16 1356 01/29/16 0502 01/30/16 0415  NA  --   < > 137 133* 125* 128*  --  130* 127*  K  --   < > 4.1 3.8 3.2* 2.8*  --  4.8 4.0  CL  --   < > 98* 95* 88* 88*  --  92* 89*  CO2  --   < > 28 27 26   --   --  26 25  GLUCOSE  --   < > 106* 78 310* 338* 45* 107* 273*  BUN  --   < > 52* 58* 57* 43*  --  54* 52*  CREATININE  --   < > 2.18* 2.51* 2.75* 2.30*  --  2.64* 2.71*  CALCIUM  --   < > 8.8* 8.4* 8.1*  --   --  8.7* 8.3*  MG 1.9  --  2.5* 2.3  --   --   --   --   --   PHOS 6.0*  --  4.3 3.9  --   --   --   --   --   < > = values in this interval not displayed. GFR: Estimated Creatinine Clearance: 17 mL/min (by C-G formula based on Cr of 2.71).  Liver Function Tests:  Recent  Labs Lab 01/24/16 2314 01/25/16 1500 01/27/16 0947  AST 126* 83* 56*  ALT 258* 199* 135*  ALKPHOS 173* 143* 131*  BILITOT 1.7* 1.7* 1.2  PROT 7.0 6.2* 5.8*  ALBUMIN 2.6* 2.1* 1.9*    Recent Labs Lab 01/24/16 2314 01/25/16 0248  LIPASE 30 26  AMYLASE  --  96   Coagulation Profile:  Recent Labs Lab 01/25/16 0248  INR 3.00*   Cardiac Enzymes:  Recent Labs Lab 01/24/16 2314 01/25/16 0248  TROPONINI 0.03 <0.03   CBG:  Recent Labs Lab 01/29/16 0757 01/29/16 1213 01/29/16 1627 01/29/16 2019 01/30/16 0003  GLUCAP 135* 215* 195* 233* 184*   Urine analysis:    Component Value Date/Time   COLORURINE AMBER* 01/24/2016 2118   APPEARANCEUR CLOUDY* 01/24/2016 2118   LABSPEC 1.018 01/24/2016 2118   PHURINE 5.0 01/24/2016 2118   GLUCOSEU NEGATIVE 01/24/2016 2118   HGBUR NEGATIVE 01/24/2016 2118   BILIRUBINUR NEGATIVE 01/24/2016 2118   KETONESUR NEGATIVE 01/24/2016 2118   PROTEINUR NEGATIVE 01/24/2016 2118   UROBILINOGEN 0.2 01/27/2011 1919   NITRITE NEGATIVE 01/24/2016 2118   LEUKOCYTESUR NEGATIVE 01/24/2016 2118    Recent Results (from the past 240 hour(s))  Urine culture     Status: None   Collection Time: 01/24/16  9:18 PM  Result Value Ref Range Status   Specimen Description URINE, CATHETERIZED  Final   Special Requests NONE  Final   Culture NO GROWTH Performed at [facility]   Final   Report Status 01/26/2016 FINAL  Final  Culture, blood (routine x 2)     Status: None (Preliminary result)   Collection Time: 01/25/16  2:48 AM  Result Value Ref Range Status   Specimen Description BLOOD LEFT HAND  Final   Special Requests IN PEDIATRIC BOTTLE 2CC  Final   Culture   Final    NO GROWTH 4 DAYS Performed at [facility]    Report Status PENDING  Incomplete      Radiology Studies: No results found.   Medications:  Scheduled: . antiseptic oral rinse  7 mL Mouth Rinse BID  .  famotidine  20 mg Oral QHS  . furosemide  80 mg  Intravenous Q8H  . insulin aspart  0-5 Units Subcutaneous TID WC  . levothyroxine  88 mcg Oral QAC breakfast  . sodium chloride flush  10-40 mL Intracatheter Q12H  . sodium chloride flush  3 mL Intravenous Q12H   Continuous: . amiodarone 60 mg/hr (01/30/16 0735)  . dextrose 5 % and 0.45% NaCl Stopped (01/28/16 0700)  . heparin 750 Units/hr (01/30/16 0735)  . milrinone 0.25 mcg/kg/min (01/30/16 0735)  . norepinephrine (LEVOPHED) Adult infusion 12.053 mcg/min (01/30/16 0735)   IOM:BTDHRC chloride, sodium chloride, sodium chloride flush, sodium chloride flush  Assessment/Plan:  Active Problems:   Acute systolic heart failure (HCC)   Acute respiratory failure (HCC)   Encounter for palliative care   Goals of care, counseling/discussion   Cardiogenic shock (HCC)   AKI (acute kidney injury) (HCC)   Acute on chronic systolic (congestive) heart failure (HCC)    Acute systolic CHF with moderate to severe mitral regurgitation Ejection fraction is 25-30% based on recent echocardiogram. Heart failure team is following. Patient is on milrinone infusion. She is also on Amiodarone. Diuretics were also resumed. She continues to require norepinephrine infusion for low blood pressure. Patient's creatinine is higher than baseline. Heart failure team is managing. Palliative medicine was also involved.   Acute on chronic kidney disease, stage III/hyponatremia Creatinine higher today. She is also hyponatremic. This is predominantly cardiorenal. Urine output has improved with initiation of diuretics. Continue to monitor renal function closely. I do not think that consulting nephrology will offer anything more to this patient. She does not appear to be a dialysis candidate.  Transaminitis Most likely due to shock liver. LFTs are stable. Continue to monitor periodically.  Chronic atrial fibrillation with RVR Heart rate remains elevated. Amiodarone infusion was resumed. Chads 2 score is at least 6. She  was on Apixaban at home. Currently on IV heparin.  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 Continue sliding scale coverage.  DVT Prophylaxis: On IV heparin    Code Status: DO NOT RESUSCITATE  Family Communication: Discussed with the patient. Her long-term poor prognosis was discussed with her. No family at bedside. Disposition Plan: Management per heart failure team. Patient and family struggling with the facts of patient's medical condition and prognosis. Palliative medicine team is also following.    LOS: 5 days   [facility]  [facility] Pager [phone] 01/30/2016, 8:44 AM  If 7PM-7AM, please contact night-coverage at [email or web address], password [facility]

## 2016-01-30 NOTE — Progress Notes (Signed)
ANTICOAGULATION CONSULT NOTE Pharmacy Consult for heparin  Indication: Afib   Allergies  Allergen Reactions  . Penicillins Swelling    Has patient had a PCN reaction causing immediate rash, facial/tongue/throat swelling, SOB or lightheadedness with hypotension: unknown Has patient had a PCN reaction causing severe rash involving mucus membranes or skin necrosis: unknown Has patient had a PCN reaction that required hospitalization: unknown Has patient had a PCN reaction occurring within the last 10 years: unknown If all of the above answers are "NO", then may proceed with Cephalosporin use.     Patient Measurements: Height: 5\' 6"  (167.6 cm) Weight: 180 lb 12.4 oz (82 kg) IBW/kg (Calculated) : 59.3  Vital Signs: Temp: 98 F (36.7 C) (05/21 0800) Temp Source: Oral (05/21 0800) BP: 92/55 mmHg (05/21 1200) Pulse Rate: 105 (05/21 1200)  Labs:  Recent Labs  01/27/16 1529  01/28/16 0400  01/28/16 1300 01/29/16 0502 01/29/16 0503 01/30/16 0415 01/30/16 1215  HGB  --   < > 9.6*  --  10.9* 9.9*  --  9.5*  --   HCT  --   < > 28.8*  --  32.0* 28.8*  --  27.9*  --   PLT  --   --  203  --   --  200  --  183  --   APTT 69*  --  72*  --   --   --   --   --   --   HEPARINUNFRC  --   --   --   < >  --   --  0.35 0.28* 0.35  CREATININE  --   < > 2.75*  --  2.30* 2.64*  --  2.71*  --   < > = values in this interval not displayed.  Estimated Creatinine Clearance: 17 mL/min (by C-G formula based on Cr of 2.71).   Assessment: 82 y.o. female admitted with CHF exacerbation, Eliquis PTA for Afib (last dose 5/15 AM), now on heparin. Heparin level now therapeutic. Hgb low but stable. No bleeding reported by RN.  Goal of Therapy:  Heparin level 0.3-0.7 units/ml Monitor platelets by anticoagulation protocol: Yes   Plan:  Continue heparin at 750 units/hr Daily HL and CBC  6/15, PharmD Clinical Pharmacy Resident Pager: [phone] 01/30/2016 1:01 PM

## 2016-01-30 NOTE — Progress Notes (Signed)
ANTICOAGULATION CONSULT NOTE Pharmacy Consult for heparin  Indication: Afib   Allergies  Allergen Reactions  . Penicillins Swelling    Has patient had a PCN reaction causing immediate rash, facial/tongue/throat swelling, SOB or lightheadedness with hypotension: unknown Has patient had a PCN reaction causing severe rash involving mucus membranes or skin necrosis: unknown Has patient had a PCN reaction that required hospitalization: unknown Has patient had a PCN reaction occurring within the last 10 years: unknown If all of the above answers are "NO", then may proceed with Cephalosporin use.     Patient Measurements: Height: 5\' 6"  (167.6 cm) Weight: 180 lb 12.4 oz (82 kg) IBW/kg (Calculated) : 59.3  Vital Signs: Temp: 97.8 F (36.6 C) (05/21 0005) Temp Source: Oral (05/21 0005) BP: 92/73 mmHg (05/21 0100) Pulse Rate: 116 (05/21 0100)  Labs:  Recent Labs  01/27/16 1529  01/28/16 0400 01/28/16 0413 01/28/16 1300 01/29/16 0502 01/29/16 0503 01/30/16 0415  HGB  --   < > 9.6*  --  10.9* 9.9*  --  9.5*  HCT  --   < > 28.8*  --  32.0* 28.8*  --  27.9*  PLT  --   --  203  --   --  200  --  183  APTT 69*  --  72*  --   --   --   --   --   HEPARINUNFRC  --   --   --  0.55  --   --  0.35 0.28*  CREATININE  --   --  2.75*  --  2.30* 2.64*  --   --   < > = values in this interval not displayed.  Estimated Creatinine Clearance: 17.4 mL/min (by C-G formula based on Cr of 2.64).   Assessment: 82 y.o. female admitted with CHF exacerbation, Eliquis PTA for Afib (last dose 5/15 AM), now on heparin. Heparin level down to 0.28 (slightly subtherapeutic) on 600 units/hr. Hgb low but stable. No issues with line or bleeding reported per RN.  Goal of Therapy:  Heparin level 0.3-0.7 units/ml Monitor platelets by anticoagulation protocol: Yes   Plan:  Increase heparin to 750 units/hr F/u 8 hr heparin level  6/15, PharmD, BCPS Clinical pharmacist, pager [phone] 01/30/2016 4:59  AM

## 2016-01-30 NOTE — Progress Notes (Addendum)
Advanced Heart Failure Rounding Note   Subjective:    Norepi up to 17 and milrinone 0.25. Co-ox back down to 53%.CVP 16 with marked v-waves  Weight up another 3 pounds.   Weight continues to climb. Creatinine stable 2.18 -> 2.51 -> 2.75 -> 2.6-> 2.7  Still in AF. On amio rate 95-105   Objective:   Weight Range:  Vital Signs:   Temp:  [97.8 F (36.6 C)-98.1 F (36.7 C)] 97.9 F (36.6 C) (05/21 0400) Pulse Rate:  [89-120] 108 (05/21 0700) BP: (92-114)/(57-77) 97/70 mmHg (05/21 0700) SpO2:  [98 %-100 %] 99 % (05/21 0700) Weight:  [82 kg (180 lb 12.4 oz)] 82 kg (180 lb 12.4 oz) (05/21 0447) Last BM Date:  (PTA)  Weight change: Filed Weights   01/28/16 0400 01/29/16 0600 01/30/16 0447  Weight: 78.8 kg (173 lb 11.6 oz) 80.6 kg (177 lb 11.1 oz) 82 kg (180 lb 12.4 oz)    Intake/Output:   Intake/Output Summary (Last 24 hours) at 01/30/16 1235 Last data filed at 01/30/16 0735  Gross per 24 hour  Intake 1997.84 ml  Output   1320 ml  Net 677.84 ml     Physical Exam: CVP ~16 , lying in bed, NAD  General: Chronically ill . No resp difficulty. On 2 liters  HEENT: normal Neck: supple. JVP to ear.  Carotids 2+ bilat; no bruits. No thyromegaly or nodule noted.  Cor: PMI nondisplaced. Irregular rate & rhythm. No rubs or 2/6 MR/TR  Loud s3 Lungs: Slight basilar crackles.  Abdomen: soft, NT, ND, no HSM. No bruits or masses. +BS  Extremities: no cyanosis, clubbing, rash, R and LLE 1-2+ edema SCDs on RLE and LLE  Neuro: alert & orientedx3, cranial nerves grossly intact. moves all 4 extremities w/o difficulty. Affect pleasant  Telemetry: A Fib 100-105   Labs: Basic Metabolic Panel:  Recent Labs Lab 01/25/16 0245  01/26/16 0445 01/27/16 0333 01/28/16 0400 01/28/16 1300 01/28/16 1356 01/29/16 0502 01/30/16 0415  NA  --   < > 137 133* 125* 128*  --  130* 127*  K  --   < > 4.1 3.8 3.2* 2.8*  --  4.8 4.0  CL  --   < > 98* 95* 88* 88*  --  92* 89*  CO2  --   < > 28 27  26   --   --  26 25  GLUCOSE  --   < > 106* 78 310* 338* 45* 107* 273*  BUN  --   < > 52* 58* 57* 43*  --  54* 52*  CREATININE  --   < > 2.18* 2.51* 2.75* 2.30*  --  2.64* 2.71*  CALCIUM  --   < > 8.8* 8.4* 8.1*  --   --  8.7* 8.3*  MG 1.9  --  2.5* 2.3  --   --   --   --   --   PHOS 6.0*  --  4.3 3.9  --   --   --   --   --   < > = values in this interval not displayed.  Liver Function Tests:  Recent Labs Lab 01/24/16 2314 01/25/16 1500 01/27/16 0947  AST 126* 83* 56*  ALT 258* 199* 135*  ALKPHOS 173* 143* 131*  BILITOT 1.7* 1.7* 1.2  PROT 7.0 6.2* 5.8*  ALBUMIN 2.6* 2.1* 1.9*    Recent Labs Lab 01/24/16 2314 01/25/16 0248  LIPASE 30 26  AMYLASE  --  96   No results for input(s): AMMONIA in the last 168 hours.  CBC:  Recent Labs Lab 01/24/16 2314  01/26/16 0445 01/27/16 0333 01/28/16 0400 01/28/16 1300 01/29/16 0502 01/30/16 0415  WBC 6.4  < > 9.2 6.0 6.8  --  5.9 6.3  NEUTROABS 4.9  --   --   --   --   --   --   --   HGB 12.8  < > 9.6* 9.5* 9.6* 10.9* 9.9* 9.5*  HCT 37.9  < > 29.0* 28.4* 28.8* 32.0* 28.8* 27.9*  MCV 97.2  < > 93.5 93.1 92.3  --  90.0 90.9  PLT 169  < > 179 201 203  --  200 183  < > = values in this interval not displayed.  Cardiac Enzymes:  Recent Labs Lab 01/24/16 2314 01/25/16 0248  TROPONINI 0.03 <0.03    BNP: BNP (last 3 results)  Recent Labs  01/14/16 1436 01/15/16 0523 01/24/16 2314  BNP 1245.4* 1386.8* 2194.5*    ProBNP (last 3 results) No results for input(s): PROBNP in the last 8760 hours.    Other results:  Imaging: No results found.   Medications:     Scheduled Medications: . antiseptic oral rinse  7 mL Mouth Rinse BID  . famotidine  20 mg Oral QHS  . furosemide  80 mg Intravenous Q8H  . insulin aspart  0-5 Units Subcutaneous TID WC  . levothyroxine  88 mcg Oral QAC breakfast  . sodium chloride flush  10-40 mL Intracatheter Q12H  . sodium chloride flush  3 mL Intravenous Q12H    Infusions: .  amiodarone 60 mg/hr (01/30/16 0735)  . dextrose 5 % and 0.45% NaCl Stopped (01/28/16 0700)  . heparin 750 Units/hr (01/30/16 0735)  . milrinone 0.25 mcg/kg/min (01/30/16 0859)  . norepinephrine (LEVOPHED) Adult infusion 12.053 mcg/min (01/30/16 0735)    PRN Medications: sodium chloride, sodium chloride, sodium chloride flush, sodium chloride flush   Assessment:  1. Cardiogenic Shock 2. A/C Systolic Heart Failure  --EF 25-30% with NICM and severe MR 3. Acute A fib RVR-  4. Lactic Acidosis 5. CKD Stage IV 6. Elevated AST/ALT  7. Hypothyroidism 8. Hyperkalemia/hyponatremia 9. Hypoalbuminemia- Albumin 2.6  10. Severe MR/TR   Plan/Discussion:    Co-ox back down this am on dual pressors. CVP up despite increasing lasix. Renal function elevated but stable today.   She continues with end-stage HF complicated by severe valvular disease with very poor prognosis. She has had severe functional decline over past 3-6 months.  I discussed with her and her family that options are to continue inotrope support with diuresis for 1-2 more days and then try to wean inotropes or switching to comfort care now. I told them comfort care is really the only feasible option as she is dependent on levophed. I will follow up with them later this afternoon. Told them we should likely make decision soon as to plan of action.   Will continue IV diuresis today with inotrope support. Add metolazone. Continue amio for AF. Family not interested in DC-CV.  Appreciate Palliative input. She is now DNR/DNI. Not candidate for CVVHD, home inotropes or mechanical support.    The patient is critically ill with multiple organ systems failure and requires high complexity decision making for assessment and support, frequent evaluation and titration of therapies, application of advanced monitoring technologies and extensive interpretation of multiple databases.   Critical Care Time devoted to patient care services  described in this note is  35 Minutes.  Length of Stay: 5  Carliyah Cotterman MD  01/30/2016, 12:35 PM  Advanced Heart Failure Team Pager [phone] (M-F; 7a - 4p)  Please contact [facility] for night-coverage after hours (4p -7a ) and weekends on [email or web address]

## 2016-01-31 LAB — CBC
HEMATOCRIT: 30.3 % — AB (ref 36.0–46.0)
HEMOGLOBIN: 10.2 g/dL — AB (ref 12.0–15.0)
MCH: 30.5 pg (ref 26.0–34.0)
MCHC: 33.7 g/dL (ref 30.0–36.0)
MCV: 90.7 fL (ref 78.0–100.0)
Platelets: 171 10*3/uL (ref 150–400)
RBC: 3.34 MIL/uL — ABNORMAL LOW (ref 3.87–5.11)
RDW: 16.2 % — AB (ref 11.5–15.5)
WBC: 6.2 10*3/uL (ref 4.0–10.5)

## 2016-01-31 LAB — CARBOXYHEMOGLOBIN
Carboxyhemoglobin: 1.3 % (ref 0.5–1.5)
Methemoglobin: 0.7 % (ref 0.0–1.5)
O2 Saturation: 54.6 %
Total hemoglobin: 10.4 g/dL — ABNORMAL LOW (ref 12.0–16.0)

## 2016-01-31 LAB — GLUCOSE, CAPILLARY
GLUCOSE-CAPILLARY: 152 mg/dL — AB (ref 65–99)
GLUCOSE-CAPILLARY: 183 mg/dL — AB (ref 65–99)
Glucose-Capillary: 26 mg/dL — CL (ref 65–99)
Glucose-Capillary: 148 mg/dL — ABNORMAL HIGH (ref 65–99)
Glucose-Capillary: 129 mg/dL — ABNORMAL HIGH (ref 65–99)

## 2016-01-31 LAB — BASIC METABOLIC PANEL
ANION GAP: 15 (ref 5–15)
BUN: 49 mg/dL — ABNORMAL HIGH (ref 6–20)
CHLORIDE: 84 mmol/L — AB (ref 101–111)
CO2: 26 mmol/L (ref 22–32)
Calcium: 8.5 mg/dL — ABNORMAL LOW (ref 8.9–10.3)
Creatinine, Ser: 2.65 mg/dL — ABNORMAL HIGH (ref 0.44–1.00)
GFR calc Af Amer: 18 mL/min — ABNORMAL LOW (ref >60)
GFR, EST NON AFRICAN AMERICAN: 16 mL/min — AB (ref >60)
Glucose, Bld: 324 mg/dL — ABNORMAL HIGH (ref 65–99)
POTASSIUM: 3.7 mmol/L (ref 3.5–5.1)
SODIUM: 125 mmol/L — AB (ref 135–145)

## 2016-01-31 LAB — HEPARIN LEVEL (UNFRACTIONATED): HEPARIN UNFRACTIONATED: 0.33 [IU]/mL (ref 0.30–0.70)

## 2016-01-31 MED ORDER — ONDANSETRON HCL 4 MG/2ML IJ SOLN
INTRAMUSCULAR | Status: AC
  Administered 2016-01-31: 4 mg
  Filled 2016-01-31: qty 2

## 2016-01-31 MED ORDER — MORPHINE SULFATE (PF) 2 MG/ML IV SOLN
1.0000 mg | INTRAVENOUS | Status: DC | PRN
  Administered 2016-01-31: 2 mg via INTRAVENOUS
  Filled 2016-01-31: qty 1

## 2016-01-31 MED ORDER — DEXTROMETHORPHAN POLISTIREX ER 30 MG/5ML PO SUER
5.0000 mL | Freq: Two times a day (BID) | ORAL | Status: DC | PRN
  Administered 2016-01-31 – 2016-02-03 (×3): 30 mg via ORAL
  Filled 2016-01-31 (×5): qty 5

## 2016-01-31 NOTE — Progress Notes (Signed)
ANTICOAGULATION CONSULT NOTE Pharmacy Consult for heparin  Indication: Afib   Allergies  Allergen Reactions  . Penicillins Swelling    Has patient had a PCN reaction causing immediate rash, facial/tongue/throat swelling, SOB or lightheadedness with hypotension: unknown Has patient had a PCN reaction causing severe rash involving mucus membranes or skin necrosis: unknown Has patient had a PCN reaction that required hospitalization: unknown Has patient had a PCN reaction occurring within the last 10 years: unknown If all of the above answers are "NO", then may proceed with Cephalosporin use.     Patient Measurements: Height: 5\' 6"  (167.6 cm) Weight: 181 lb (82.1 kg) IBW/kg (Calculated) : 59.3  Vital Signs: Temp: 97.5 F (36.4 C) (05/22 0400) Temp Source: Oral (05/22 0400) BP: 105/71 mmHg (05/22 0700) Pulse Rate: 91 (05/22 0700)  Labs:  Recent Labs  01/29/16 0502  01/30/16 0415 01/30/16 1215 01/31/16 0315  HGB 9.9*  --  9.5*  --  10.2*  HCT 28.8*  --  27.9*  --  30.3*  PLT 200  --  183  --  171  HEPARINUNFRC  --   < > 0.28* 0.35 0.33  CREATININE 2.64*  --  2.71*  --  2.65*  < > = values in this interval not displayed.  Estimated Creatinine Clearance: 17.4 mL/min (by C-G formula based on Cr of 2.65).  02/02/16 amiodarone 60 mg/hr (01/31/16 0149)  . dextrose 5 % and 0.45% NaCl Stopped (01/28/16 0700)  . heparin 750 Units/hr (01/30/16 2000)  . milrinone 0.25 mcg/kg/min (01/31/16 0149)  . norepinephrine (LEVOPHED) Adult infusion 12 mcg/min (01/30/16 2000)    Assessment: 82 y.o. female admitted with CHF exacerbation, Eliquis PTA for Afib (last dose 5/15 AM), now on heparin. Heparin level now therapeutic. Hgb low but stable. No bleeding reported by RN.  Goal of Therapy:  Heparin level 0.3-0.7 units/ml Monitor platelets by anticoagulation protocol: Yes   Plan:  Continue heparin at 750 units/hr Daily HL and CBC F/u plans for transition to comfort care, possible D/C  heparin.  6/15, BCPS  Clinical Pharmacist Pager [phone]  01/31/2016 7:47 AM

## 2016-01-31 NOTE — Progress Notes (Signed)
Advanced Heart Failure Rounding Note   Subjective:    Norepi at 12 and milrinone 0.25. Co-ox back down to 54.6%. CVP 15 with marked v-waves    Weight stable. Creatinine elevated but stable 2.18 -> 2.51 -> 2.75 -> 2.6-> 2.7 -> 2.65  Still in AF. On amio rate 95-105  Coughing more.  Breathing is OK, but sore all over. Still swollen in her legs.    Objective:   Weight Range:  Vital Signs:   Temp:  [97.5 F (36.4 C)-98 F (36.7 C)] 97.5 F (36.4 C) (05/22 0400) Pulse Rate:  [44-117] 91 (05/22 0700) Resp:  [14-16] 16 (05/22 0400) BP: (91-109)/(55-78) 105/71 mmHg (05/22 0700) SpO2:  [96 %-100 %] 100 % (05/22 0700) Weight:  [181 lb (82.1 kg)] 181 lb (82.1 kg) (05/22 0400) Last BM Date: 01/30/16  Weight change: Filed Weights   01/29/16 0600 01/30/16 0447 01/31/16 0400  Weight: 177 lb 11.1 oz (80.6 kg) 180 lb 12.4 oz (82 kg) 181 lb (82.1 kg)    Intake/Output:   Intake/Output Summary (Last 24 hours) at 01/31/16 0737 Last data filed at 01/31/16 0700  Gross per 24 hour  Intake 1731.78 ml  Output   1975 ml  Net -243.22 ml     Physical Exam: CVP ~15-16 , lying in bed, NAD  General: Chronically ill . No resp difficulty. On 2 liters  HEENT: normal Neck: supple. JVP to ear.  Carotids 2+ bilat; no bruits. No thyromegaly or nodule noted.  Cor: PMI nondisplaced. Irregular rate & rhythm. No rubs or 2/6 MR/TR  Loud s3 Lungs: Slight basilar crackles. Scattered rhonchi that clear with cough Abdomen: soft, NT, ND, no HSM. No bruits or masses. +BS  Extremities: no cyanosis, clubbing, rash, R and LLE 1-2+ edema into thighs with SCDs on RLE and LLE  Neuro: alert & orientedx3, cranial nerves grossly intact. moves all 4 extremities w/o difficulty. Affect pleasant  Telemetry: A Fib 90-100s  Labs: Basic Metabolic Panel:  Recent Labs Lab 01/25/16 0245  01/26/16 0445 01/27/16 0333 01/28/16 0400 01/28/16 1300 01/28/16 1356 01/29/16 0502 01/30/16 0415 01/31/16 0315  NA  --    < > 137 133* 125* 128*  --  130* 127* 125*  K  --   < > 4.1 3.8 3.2* 2.8*  --  4.8 4.0 3.7  CL  --   < > 98* 95* 88* 88*  --  92* 89* 84*  CO2  --   < > 28 27 26   --   --  26 25 26   GLUCOSE  --   < > 106* 78 310* 338* 45* 107* 273* 324*  BUN  --   < > 52* 58* 57* 43*  --  54* 52* 49*  CREATININE  --   < > 2.18* 2.51* 2.75* 2.30*  --  2.64* 2.71* 2.65*  CALCIUM  --   < > 8.8* 8.4* 8.1*  --   --  8.7* 8.3* 8.5*  MG 1.9  --  2.5* 2.3  --   --   --   --   --   --   PHOS 6.0*  --  4.3 3.9  --   --   --   --   --   --   < > = values in this interval not displayed.  Liver Function Tests:  Recent Labs Lab 01/24/16 2314 01/25/16 1500 01/27/16 0947  AST 126* 83* 56*  ALT 258* 199* 135*  ALKPHOS 173*  143* 131*  BILITOT 1.7* 1.7* 1.2  PROT 7.0 6.2* 5.8*  ALBUMIN 2.6* 2.1* 1.9*    Recent Labs Lab 01/24/16 2314 01/25/16 0248  LIPASE 30 26  AMYLASE  --  96   No results for input(s): AMMONIA in the last 168 hours.  CBC:  Recent Labs Lab 01/24/16 2314  01/27/16 0333 01/28/16 0400 01/28/16 1300 01/29/16 0502 01/30/16 0415 01/31/16 0315  WBC 6.4  < > 6.0 6.8  --  5.9 6.3 6.2  NEUTROABS 4.9  --   --   --   --   --   --   --   HGB 12.8  < > 9.5* 9.6* 10.9* 9.9* 9.5* 10.2*  HCT 37.9  < > 28.4* 28.8* 32.0* 28.8* 27.9* 30.3*  MCV 97.2  < > 93.1 92.3  --  90.0 90.9 90.7  PLT 169  < > 201 203  --  200 183 171  < > = values in this interval not displayed.  Cardiac Enzymes:  Recent Labs Lab 01/24/16 2314 01/25/16 0248  TROPONINI 0.03 <0.03    BNP: BNP (last 3 results)  Recent Labs  01/14/16 1436 01/15/16 0523 01/24/16 2314  BNP 1245.4* 1386.8* 2194.5*    ProBNP (last 3 results) No results for input(s): PROBNP in the last 8760 hours.    Other results:  Imaging: No results found.   Medications:     Scheduled Medications: . antiseptic oral rinse  7 mL Mouth Rinse BID  . famotidine  20 mg Oral QHS  . furosemide  80 mg Intravenous Q8H  . insulin aspart   0-5 Units Subcutaneous TID WC  . levothyroxine  88 mcg Oral QAC breakfast  . metolazone  5 mg Oral Daily  . sodium chloride flush  10-40 mL Intracatheter Q12H    Infusions: . amiodarone 60 mg/hr (01/31/16 0149)  . dextrose 5 % and 0.45% NaCl Stopped (01/28/16 0700)  . heparin 750 Units/hr (01/30/16 2000)  . milrinone 0.25 mcg/kg/min (01/31/16 0149)  . norepinephrine (LEVOPHED) Adult infusion 12 mcg/min (01/30/16 2000)    PRN Medications: dextromethorphan, sodium chloride flush   Assessment:  1. Cardiogenic Shock 2. A/C Systolic Heart Failure  --EF 25-30% with NICM and severe MR 3. Acute A fib RVR-  4. Lactic Acidosis 5. CKD Stage IV 6. Elevated AST/ALT  7. Hypothyroidism 8. Hyperkalemia/hyponatremia 9. Hypoalbuminemia- Albumin 2.6  10. Severe MR/TR   Plan/Discussion:    Co-ox back remains low this am on dual pressors. CVP up despite increasing lasix. Renal function elevated but stable.  She continues with end-stage HF complicated by severe valvular disease with very poor prognosis. She has had severe functional decline over past 3-6 months. Dr. Gala Romney had long discussion with her and her family yesterday. They are aware that comfort care is really the only feasible option as she is dependent on levophed.   Will continue IV diuresis today with inotrope support this am. Weight stable despite addition of metolazone. Need to discuss plan of action with family once they arrive this am.   Continue amio for AF. Family not interested in DC-CV.  Appreciate Palliative input. She is now DNR/DNI. Not candidate for CVVHD, home inotropes or mechanical support.   Length of Stay: 6  Graciella Freer PA-C 01/31/2016, 7:37 AM  Advanced Heart Failure Team Pager [phone] (M-F; 7a - 4p)  Please contact [facility] for night-coverage after hours (4p -7a ) and weekends on [email or web address]  Patient seen with PA, agree with the above note.  I spoke with patient and daughter in  the room this afternoon. She has already been seen by palliative care.  She and her daughter agree at this point on a comfort care transition.  She is stable symptomatically, not significantly short of breath.  - Stop amiodarone gtt and heparin gtt.  - Will titrate off norepinephrine gradually this afternoon. - Can leave milrinone for now, stop tomorrow.   - Morphine will be available prn and will have anti-tussives available prn.   I suspect that she will expire in the hospital.  Marca Ancona 01/31/2016 1:30 PM

## 2016-01-31 NOTE — Progress Notes (Signed)
PT Cancellation Note  Patient Details Name: Theresa Cortez MRN: [id number] DOB: 11-Sep-1930   Cancelled Treatment:    Reason Eval/Treat Not Completed: Medical issues which prohibited therapy. Noted Dr. Prescott Gum and Palliative Care notes re: continued decline and need to move towards comfort care. Will follow along until this decision is made, however does not appear PT is indicated at this time. As stated previously, she did not even tolerate supine exercises (BP dropping with limited exercise and rest breaks). If SNF is necessary, she would not qualify under skilled rehab and would be best served with Hospice benefits.   Demarkus Remmel 01/31/2016, 4:35 PM  Pager [phone]

## 2016-01-31 NOTE — Progress Notes (Signed)
[facility] PROGRESS NOTE  AHRI OLSON ZOX:[id number] DOB: 11/28/1929 DOA: 01/24/2016  PCP: Gwenyth Bender, MD  Brief HPI: 82 year old African-American female with a past medical history of systolic CHF, who was recently hospitalized for CHF exacerbation, presented with shortness of breath. She was admitted to the intensive care unit. Cardiology was consulted. Patient remains on inotropes. Clinically, she appears to be worsening. She remains on inotropes and vasopressors. Cardiology discussing comfort care with patient and family on a regular basis.  Past medical history:  Past Medical History  Diagnosis Date  . Hypertension   . Wound infection (HCC)     Right knee   . Hypothyroidism   . History of diverticulosis   . Hyperlipidemia   . Type II diabetes mellitus (HCC)   . History of blood transfusion X 2    w/knee replacement  . Stroke [facility]) 2000    denies residual on 11/23/2015  . Rheumatoid arthritis(714.0)   . DJD (degenerative joint disease)   . Arthritis     "hands, arms" (11/23/2015)  . CKD (chronic kidney disease), stage III   . Chronic combined systolic and diastolic heart failure (HCC)   . Atrial fibrillation (HCC)   . Interstitial lung disease (HCC)     Consultants: Cardiology/Heart failure team  Procedures: PICC line placement  Antibiotics: None  Subjective: Patient complains of coughing episode every morning when she wakes up. Currently feeling well. Denies any nausea or vomiting. No chest pain. Denies any shortness of breath.   Objective:  Vital Signs  Filed Vitals:   01/31/16 0500 01/31/16 0600 01/31/16 0700 01/31/16 0800  BP: 95/68 103/77 105/71 99/82  Pulse: 92 101 91 89  Temp:    98 F (36.7 C)  TempSrc:    Oral  Resp:      Height:      Weight:      SpO2: 98% 100% 100% 100%    Intake/Output Summary (Last 24 hours) at 01/31/16 0935 Last data filed at 01/31/16 0800  Gross per 24 hour  Intake 1799.78 ml  Output   2200 ml  Net -400.22  ml   Filed Weights   01/29/16 0600 01/30/16 0447 01/31/16 0400  Weight: 80.6 kg (177 lb 11.1 oz) 82 kg (180 lb 12.4 oz) 82.1 kg (181 lb)    General appearance: alert, cooperative, appears stated age and no distress Resp: crackles bilaterally. No wheezing. No rhonchi. Cardio: S1, S2 is Regular. No S3, S4. Systolic murmur appreciated over the precordium. GI: soft, non-tender; bowel sounds normal; no masses,  no organomegaly Extremities: Minimal edema bilaterally in the lower extremities Neurologic: Awake and alert. Oriented 3. No focal neurological deficits.  Lab Results:  Data Reviewed: I have personally reviewed following labs and imaging studies  CBC:  Recent Labs Lab 01/24/16 2314  01/27/16 0333 01/28/16 0400 01/28/16 1300 01/29/16 0502 01/30/16 0415 01/31/16 0315  WBC 6.4  < > 6.0 6.8  --  5.9 6.3 6.2  NEUTROABS 4.9  --   --   --   --   --   --   --   HGB 12.8  < > 9.5* 9.6* 10.9* 9.9* 9.5* 10.2*  HCT 37.9  < > 28.4* 28.8* 32.0* 28.8* 27.9* 30.3*  MCV 97.2  < > 93.1 92.3  --  90.0 90.9 90.7  PLT 169  < > 201 203  --  200 183 171  < > = values in this interval not displayed. Basic Metabolic Panel:  Recent Labs  Lab 01/25/16 0245  01/26/16 0445 01/27/16 0333 01/28/16 0400 01/28/16 1300 01/28/16 1356 01/29/16 0502 01/30/16 0415 01/31/16 0315  NA  --   < > 137 133* 125* 128*  --  130* 127* 125*  K  --   < > 4.1 3.8 3.2* 2.8*  --  4.8 4.0 3.7  CL  --   < > 98* 95* 88* 88*  --  92* 89* 84*  CO2  --   < > 28 27 26   --   --  26 25 26   GLUCOSE  --   < > 106* 78 310* 338* 45* 107* 273* 324*  BUN  --   < > 52* 58* 57* 43*  --  54* 52* 49*  CREATININE  --   < > 2.18* 2.51* 2.75* 2.30*  --  2.64* 2.71* 2.65*  CALCIUM  --   < > 8.8* 8.4* 8.1*  --   --  8.7* 8.3* 8.5*  MG 1.9  --  2.5* 2.3  --   --   --   --   --   --   PHOS 6.0*  --  4.3 3.9  --   --   --   --   --   --   < > = values in this interval not displayed. GFR: Estimated Creatinine Clearance: 17.4 mL/min  (by C-G formula based on Cr of 2.65).  Liver Function Tests:  Recent Labs Lab 01/24/16 2314 01/25/16 1500 01/27/16 0947  AST 126* 83* 56*  ALT 258* 199* 135*  ALKPHOS 173* 143* 131*  BILITOT 1.7* 1.7* 1.2  PROT 7.0 6.2* 5.8*  ALBUMIN 2.6* 2.1* 1.9*    Recent Labs Lab 01/24/16 2314 01/25/16 0248  LIPASE 30 26  AMYLASE  --  96   Coagulation Profile:  Recent Labs Lab 01/25/16 0248  INR 3.00*   Cardiac Enzymes:  Recent Labs Lab 01/24/16 2314 01/25/16 0248  TROPONINI 0.03 <0.03   CBG:  Recent Labs Lab 01/30/16 0818 01/30/16 1202 01/30/16 1645 01/30/16 2147 01/31/16 0810  GLUCAP 170* 162* 169* 168* 152*   Urine analysis:    Component Value Date/Time   COLORURINE AMBER* 01/24/2016 2118   APPEARANCEUR CLOUDY* 01/24/2016 2118   LABSPEC 1.018 01/24/2016 2118   PHURINE 5.0 01/24/2016 2118   GLUCOSEU NEGATIVE 01/24/2016 2118   HGBUR NEGATIVE 01/24/2016 2118   BILIRUBINUR NEGATIVE 01/24/2016 2118   KETONESUR NEGATIVE 01/24/2016 2118   PROTEINUR NEGATIVE 01/24/2016 2118   UROBILINOGEN 0.2 01/27/2011 1919   NITRITE NEGATIVE 01/24/2016 2118   LEUKOCYTESUR NEGATIVE 01/24/2016 2118    Recent Results (from the past 240 hour(s))  Urine culture     Status: None   Collection Time: 01/24/16  9:18 PM  Result Value Ref Range Status   Specimen Description URINE, CATHETERIZED  Final   Special Requests NONE  Final   Culture NO GROWTH Performed at [facility]   Final   Report Status 01/26/2016 FINAL  Final  Culture, blood (routine x 2)     Status: None   Collection Time: 01/25/16  2:48 AM  Result Value Ref Range Status   Specimen Description BLOOD LEFT HAND  Final   Special Requests IN PEDIATRIC BOTTLE [facility]  Final   Culture   Final    NO GROWTH 5 DAYS Performed at [facility]    Report Status 01/30/2016 FINAL  Final      Radiology Studies: No results found.   Medications:  Scheduled: . antiseptic  oral rinse  7 mL Mouth Rinse BID  .  famotidine  20 mg Oral QHS  . furosemide  80 mg Intravenous Q8H  . insulin aspart  0-5 Units Subcutaneous TID WC  . levothyroxine  88 mcg Oral QAC breakfast  . metolazone  5 mg Oral Daily  . sodium chloride flush  10-40 mL Intracatheter Q12H   Continuous: . amiodarone 60 mg/hr (01/31/16 0802)  . dextrose 5 % and 0.45% NaCl Stopped (01/28/16 0700)  . heparin 750 Units/hr (01/31/16 0800)  . milrinone 0.25 mcg/kg/min (01/31/16 0800)  . norepinephrine (LEVOPHED) Adult infusion 12 mcg/min (01/31/16 0800)   EUM:[id number], sodium chloride flush  Assessment/Plan:  Active Problems:   Acute systolic heart failure (HCC)   Acute respiratory failure (HCC)   Encounter for palliative care   Goals of care, counseling/discussion   Cardiogenic shock (HCC)   AKI (acute kidney injury) (HCC)   Acute on chronic systolic (congestive) heart failure (HCC)    Acute systolic CHF with moderate to severe mitral regurgitation Ejection fraction is 25-30% based on recent echocardiogram. Heart failure team is following. Patient is on milrinone infusion and diuretics. She is also on Amiodarone. She continues to require norepinephrine infusion for low blood pressure. Patient's creatinine is higher than baseline. Heart failure team is managing. Palliative medicine was also involved.   Acute on chronic kidney disease, stage III/hyponatremia Creatinine is higher than her baseline. But stable. She is also hyponatremic. This is predominantly cardiorenal. Urine output has improved with initiation of diuretics. Continue to monitor renal function closely. She does not appear to be a dialysis candidate.  Transaminitis Most likely due to shock liver. LFTs are stable. Continue to monitor periodically.  Chronic atrial fibrillation with RVR Heart rate remains elevated. Amiodarone infusion was resumed. Chads 2 score is at least 6. She was on Apixaban at home. Currently on IV heparin.  Hypothyroidism Continue  levothyroxine.  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 Continue sliding scale coverage.  DVT Prophylaxis: On IV heparin    Code Status: DO NOT RESUSCITATE  Family Communication: Discussed with the patient and her family. Disposition Plan: Management per heart failure team. Patient and family struggling with the facts of patient's medical condition and prognosis. Palliative medicine team is also following.    LOS: 6 days   Sc[employer]x  [facility] Pager [phone] 01/31/2016, 9:35 AM  If 7PM-7AM, please contact night-coverage at [email or web address], password [email or web address]

## 2016-01-31 NOTE — Progress Notes (Signed)
OT Cancellation Note  Patient Details Name: Theresa Cortez MRN: [id number] DOB: 1930/06/26   Cancelled Treatment:    Reason Eval/Treat Not Completed: Medical issues which prohibited therapy - Pt on pressors and BP remains soft.  Pt/family considering transitioning to full comfort care.  Will check back and see if medically  appropriate.   Angelene Giovanni Winthrop, OTR/L [phone]  01/31/2016, 11:39 AM

## 2016-01-31 NOTE — Progress Notes (Signed)
Daily Progress Note   Patient Name: Theresa Cortez       Date: 01/31/2016 DOB: 1930-05-23  Age: 82 y.o. MRN#: [id number] Attending Physician: Osvaldo Shipper, MD Primary Care Physician: Gwenyth Bender, MD Admit Date: 01/24/2016  Reason for Consultation/Follow-up: Establishing goals of care  Subjective:  awake, alert, " I'm doing fine" Appears weaker, more fatigued  Length of Stay: 6  Current Medications: Scheduled Meds:  . antiseptic oral rinse  7 mL Mouth Rinse BID  . famotidine  20 mg Oral QHS  . furosemide  80 mg Intravenous Q8H  . insulin aspart  0-5 Units Subcutaneous TID WC  . levothyroxine  88 mcg Oral QAC breakfast  . metolazone  5 mg Oral Daily  . sodium chloride flush  10-40 mL Intracatheter Q12H    Continuous Infusions: . amiodarone 60 mg/hr (01/31/16 0802)  . dextrose 5 % and 0.45% NaCl Stopped (01/28/16 0700)  . heparin 750 Units/hr (01/31/16 0800)  . milrinone 0.25 mcg/kg/min (01/31/16 0800)  . norepinephrine (LEVOPHED) Adult infusion 12 mcg/min (01/31/16 0800)    PRN Meds: dextromethorphan, sodium chloride flush  Physical Exam         Appears weaker, chronically ill Shallow breathing scattered crackles Irregular Abdomen soft Has peripheral edema Opens eyes, converses appropriately, al though appears tired.   Vital Signs: BP 97/63 mmHg  Pulse 90  Temp(Src) 97.9 F (36.6 C) (Oral)  Resp 16  Ht 5\' 6"  (1.676 m)  Wt 82.1 kg (181 lb)  BMI 29.23 kg/m2  SpO2 97% SpO2: SpO2: 97 % O2 Device: O2 Device: Nasal Cannula O2 Flow Rate: O2 Flow Rate (L/min): 2 L/min  Intake/output summary:  Intake/Output Summary (Last 24 hours) at 01/31/16 1310 Last data filed at 01/31/16 1300  Gross per 24 hour  Intake 1739.6 ml  Output   2600 ml  Net -860.4 ml   LBM: Last  BM Date: 01/31/16 Baseline Weight: Weight: 78.4 kg (172 lb 13.5 oz) Most recent weight: Weight: 82.1 kg (181 lb)       Palliative Assessment/Data:    Flowsheet Rows        Most Recent Value   Intake Tab    Referral Department  Cardiology   Unit at Time of Referral  ICU   Palliative Care Primary Diagnosis  Cardiac   Date Notified  01/26/16   Palliative Care Type  Return patient Palliative Care   Reason for referral  Clarify Goals of Care   Date of Admission  01/24/16   Date first seen by Palliative Care  01/26/16   # of days Palliative referral response time  0 Day(s)   # of days IP prior to Palliative referral  2   Clinical Assessment    Palliative Performance Scale Score  30%   Pain Max last 24 hours  5   Pain Min Last 24 hours  4   Dyspnea Max Last 24 Hours  4   Dyspnea Min Last 24 hours  3   Psychosocial & Spiritual Assessment    Palliative Care Outcomes    Patient/Family meeting held?  Yes   Who was at the meeting?  patient    Palliative Care follow-up planned  Yes, Facility      Patient Active Problem List   Diagnosis Date Noted  . Acute respiratory failure (HCC)   . Encounter for palliative care   . Goals of care, counseling/discussion   . Cardiogenic shock (HCC)   . AKI (acute kidney injury) (HCC)   . Acute on chronic systolic (congestive) heart failure (HCC)   . Acute systolic heart failure (HCC) 01/25/2016  . Left lower quadrant pain   . Interstitial lung disease (HCC)   . Dyspnea 01/13/2016  . Acute on chronic combined systolic (congestive) and diastolic (congestive) heart failure (HCC) 01/13/2016  . Cough 01/13/2016  . Cardiorenal disease 01/13/2016  . Nonischemic cardiomyopathy (HCC) 12/21/2015  . Coronary artery disease 12/21/2015  . Chronic renal insufficiency, stage IV (severe) 12/21/2015  . PAF (paroxysmal atrial fibrillation) (HCC) 12/21/2015  . HCAP (healthcare-associated pneumonia) 12/21/2015  . Chronic anticoagulation 12/21/2015  . Nausea  with vomiting 12/20/2015  . Influenza B 12/20/2015  . Fluid overload 12/20/2015  . Chronic combined systolic and diastolic congestive heart failure (HCC) 12/20/2015  . Adnexal cyst - left 12/16/2015  . Protein-calorie malnutrition, moderate (HCC) 12/16/2015  . Abdominal pain   . Lactic acidosis   . HLD (hyperlipidemia) 12/15/2015  . Hypothyroidism 12/15/2015  . RA (rheumatoid arthritis) (HCC) 12/15/2015  . Hyperkalemia 12/15/2015  . Hyponatremia 12/15/2015  . Chronic atrial fibrillation (HCC) 12/15/2015  . Hypotension 12/15/2015  . Arterial hypotension   . Pain of upper abdomen   . Diabetes mellitus, type 2 (HCC) 12/06/2015  . Acute renal failure superimposed on stage 3 chronic kidney disease (HCC) 12/06/2015  . Atrial fibrillation with rapid ventricular response (HCC)   . ARF (acute renal failure) (HCC)   . Elevated LFTs   . Positive D dimer   . Mitral regurgitation   . Tricuspid regurgitation   . Pulmonary hypertension (HCC)   . Heart failure (HCC) 11/22/2015  . Acute congestive heart failure (HCC) 11/22/2015  . Hypertension 10/11/2012  . Cellulitis of knee, right 10/11/2012  . DM (diabetes mellitus) (HCC) 10/11/2012  . Dermatitis 10/11/2012    Palliative Care Assessment & Plan   Patient Profile:    Assessment:  End-stage HF    severe valvular disease    severe functional decline over past 3-6 months.  ?recently diagnosed with ILD  Recommendations/Plan:  Agree with Dr. Prescott Gum discussions with patient and her family with regards to pursuing comfort as singular goal. It is further noted that she is not candidate for CVVHD, home inotropes or mechanical support.   I have discussed this directly with the patient briefly this afternoon. While there are a lot of family  members in the room, ( cousin, friend from church, pastor, son), her two key decision makers, her 2 daughters are not in the room. Patient wants to discuss directly with them, by herself this afternoon  with regards to her overall goals of care and decision making from here. Palliative will remain available to assist in facilitating this.    Code Status:    Code Status Orders        Start     Ordered   01/27/16 1337  Do not attempt resuscitation (DNR)   Continuous    Question Answer Comment  In the event of cardiac or respiratory ARREST Do not call a "code blue"   In the event of cardiac or respiratory ARREST Do not perform Intubation, CPR, defibrillation or ACLS   In the event of cardiac or respiratory ARREST Use medication by any route, position, wound care, and other measures to relive pain and suffering. May use oxygen, suction and manual treatment of airway obstruction as needed for comfort.      01/27/16 1336    Code Status History    Date Active Date Inactive Code Status Order ID Comments User Context   01/25/2016  2:26 AM 01/27/2016  1:36 PM Full Code [id number]  Nelda Bucks, MD ED   01/13/2016  9:56 PM 01/18/2016 10:23 PM Full Code [id number]  Delano Metz, MD Inpatient   12/15/2015  8:04 PM 12/20/2015  5:34 PM Full Code [id number]  Lorretta Harp, MD ED   11/22/2015 11:46 PM 11/30/2015  2:13 PM Full Code [id number]  Clydie Braun, MD ED    Advance Directive Documentation        Most Recent Value   Type of Advance Directive  Out of facility DNR (pink MOST or yellow form)   Pre-existing out of facility DNR order (yellow form or pink MOST form)  Pink MOST form placed in chart (order not valid for inpatient use)   "MOST" Form in Place?         Prognosis:   appears to be days to some very limited number of weeks at this point.   Discharge Planning:  To Be Determined patient appears appropriate for hospice, even inpatient hospice towards the end of this hospitalization.   Care plan was discussed with  Patient herself.  Thank you for allowing the Palliative Medicine Team to assist in the care of this patient.   Time In: 12 Time Out: 1235 Total Time 35 Prolonged Time Billed   no       Greater than 50%  of this time was spent counseling and coordinating care related to the above assessment and plan.  Rosalin Hawking, MD [phone]  Please contact Palliative Medicine Team phone at [phone] for questions and concerns.

## 2016-02-01 DIAGNOSIS — R0600 Dyspnea, unspecified: Secondary | ICD-10-CM

## 2016-02-01 LAB — CBC
HCT: 29.2 % — ABNORMAL LOW (ref 36.0–46.0)
Hemoglobin: 10.2 g/dL — ABNORMAL LOW (ref 12.0–15.0)
MCH: 31 pg (ref 26.0–34.0)
MCHC: 34.9 g/dL (ref 30.0–36.0)
MCV: 88.8 fL (ref 78.0–100.0)
PLATELETS: 160 10*3/uL (ref 150–400)
RBC: 3.29 MIL/uL — AB (ref 3.87–5.11)
RDW: 16.1 % — ABNORMAL HIGH (ref 11.5–15.5)
WBC: 4.6 10*3/uL (ref 4.0–10.5)

## 2016-02-01 LAB — BASIC METABOLIC PANEL
ANION GAP: 9 (ref 5–15)
BUN: 47 mg/dL — AB (ref 6–20)
CALCIUM: 8.8 mg/dL — AB (ref 8.9–10.3)
CO2: 34 mmol/L — ABNORMAL HIGH (ref 22–32)
CREATININE: 2.69 mg/dL — AB (ref 0.44–1.00)
Chloride: 84 mmol/L — ABNORMAL LOW (ref 101–111)
GFR, EST AFRICAN AMERICAN: 18 mL/min — AB (ref >60)
GFR, EST NON AFRICAN AMERICAN: 15 mL/min — AB (ref >60)
Glucose, Bld: 121 mg/dL — ABNORMAL HIGH (ref 65–99)
POTASSIUM: 3.2 mmol/L — AB (ref 3.5–5.1)
SODIUM: 127 mmol/L — AB (ref 135–145)

## 2016-02-01 LAB — CARBOXYHEMOGLOBIN
Carboxyhemoglobin: 1.5 % (ref 0.5–1.5)
METHEMOGLOBIN: 0.7 % (ref 0.0–1.5)
O2 Saturation: 58.6 %
TOTAL HEMOGLOBIN: 11.7 g/dL — AB (ref 12.0–16.0)

## 2016-02-01 MED ORDER — MORPHINE SULFATE (PF) 2 MG/ML IV SOLN: 1.0000 mg | INTRAVENOUS | Status: DC | PRN

## 2016-02-01 MED ORDER — FUROSEMIDE 80 MG PO TABS
80.0000 mg | ORAL_TABLET | Freq: Two times a day (BID) | ORAL | Status: DC
  Administered 2016-02-02: 80 mg via ORAL
  Filled 2016-02-01: qty 1

## 2016-02-01 MED ORDER — LORAZEPAM 2 MG/ML IJ SOLN: 0.5000 mg | INTRAMUSCULAR | Status: DC | PRN

## 2016-02-01 MED ORDER — FUROSEMIDE 40 MG PO TABS: 40.0000 mg | ORAL_TABLET | Freq: Two times a day (BID) | ORAL | Status: DC

## 2016-02-01 MED ORDER — POTASSIUM CHLORIDE CRYS ER 20 MEQ PO TBCR
20.0000 meq | EXTENDED_RELEASE_TABLET | Freq: Two times a day (BID) | ORAL | Status: DC
  Administered 2016-02-02 – 2016-02-03 (×3): 20 meq via ORAL
  Filled 2016-02-01: qty 1
  Filled 2016-02-01: qty 2
  Filled 2016-02-01: qty 1

## 2016-02-01 MED ORDER — POTASSIUM CHLORIDE CRYS ER 20 MEQ PO TBCR: 40.0000 meq | EXTENDED_RELEASE_TABLET | ORAL | Status: AC

## 2016-02-01 NOTE — Progress Notes (Signed)
   02/01/16 1200  Clinical Encounter Type  Visited With Family  Visit Type Initial;Spiritual support;Social support;Critical Care  Spiritual Encounters  Spiritual Needs Emotional;Grief support  CH visited with pt family (son and grandson) at bedside, introducing Spiritual Care and functions; CH offered social, emotional and spiritual support.  CH available as needed.  CH did offer to return for larger prayer when more family present. Erline Levine 12:06 PM

## 2016-02-01 NOTE — Progress Notes (Signed)
Occupational Therapy Discharge Patient Details Name: SURIAH PERAGINE MRN: [id number] DOB: 1929/11/20 Today's Date: 02/01/2016 Time:  -     Patient discharged from OT services secondary to not medically ready. Please reorder therapy if appropriate. OT would recommend Palliative services and they are already consulted. .  Please see latest therapy progress note for current level of functioning and progress toward goals.    Progress and discharge plan discussed with patient and/or caregiver: Patient unable to participate in discharge planning and no caregivers available  GO     Felecia Shelling   OTR/L Pager: [phone] Office: [phone] .  02/01/2016, 7:32 AM

## 2016-02-01 NOTE — Progress Notes (Addendum)
Daily Progress Note   Patient Name: Theresa Cortez       Date: 02/01/2016 DOB: May 07, 1930  Age: 82 y.o. MRN#: [id number] Attending Physician: Bonnielee Haff, MD Primary Care Physician: Rogers Blocker, MD Admit Date: 01/24/2016  Reason for Consultation/Follow-up: Establishing goals of care  Subjective: Met this AM with patient, daughter, son, and grandson.  She reports being "fine" and denies any needs.  She reports doing better today and plan is to see how she does today off levophed and milrinone.  Family understands that there is high likelihood that her condition will continue to decompensate and goal moving forward is for comfort.    Length of Stay: 7  Current Medications: Scheduled Meds:  . antiseptic oral rinse  7 mL Mouth Rinse BID  . [START ON 02/02/2016] furosemide  80 mg Oral BID  . potassium chloride  40 mEq Oral Q4H    Continuous Infusions: . milrinone 0.125 mcg/kg/min (02/01/16 0800)    PRN Meds: dextromethorphan, morphine injection, sodium chloride flush  Physical Exam         Weak, lying in bed but participates in conversation, chronically ill Shallow breathing scattered crackles Regular Abdomen soft Has peripheral edema Opens eyes, converses appropriately, appears tired.   Vital Signs: BP 94/50 mmHg  Pulse 75  Temp(Src) 97.3 F (36.3 C) (Oral)  Resp 16  Ht '5\' 6"'$  (1.676 m)  Wt 78.3 kg (172 lb 9.9 oz)  BMI 27.87 kg/m2  SpO2 100% SpO2: SpO2: 100 % O2 Device: O2 Device: Nasal Cannula O2 Flow Rate: O2 Flow Rate (L/min): 2 L/min  Intake/output summary:   Intake/Output Summary (Last 24 hours) at 02/01/16 0949 Last data filed at 02/01/16 0900  Gross per 24 hour  Intake  435.4 ml  Output   5050 ml  Net -4614.6 ml   LBM: Last BM Date: 01/31/16 Baseline  Weight: Weight: 78.4 kg (172 lb 13.5 oz) Most recent weight: Weight: 78.3 kg (172 lb 9.9 oz)       Palliative Assessment/Data:    Flowsheet Rows        Most Recent Value   Intake Tab    Referral Department  Cardiology   Unit at Time of Referral  ICU   Palliative Care Primary Diagnosis  Cardiac   Date Notified  01/26/16   Palliative Care  Type  Return patient Palliative Care   Reason for referral  Clarify Goals of Care   Date of Admission  01/24/16   Date first seen by Palliative Care  01/26/16   # of days Palliative referral response time  0 Day(s)   # of days IP prior to Palliative referral  2   Clinical Assessment    Palliative Performance Scale Score  30%   Pain Max last 24 hours  5   Pain Min Last 24 hours  4   Dyspnea Max Last 24 Hours  4   Dyspnea Min Last 24 hours  3   Psychosocial & Spiritual Assessment    Palliative Care Outcomes    Patient/Family meeting held?  Yes   Who was at the meeting?  patient    Palliative Care follow-up planned  Yes, Facility      Patient Active Problem List   Diagnosis Date Noted  . Acute respiratory failure ([place])   . Encounter for palliative care   . Goals of care, counseling/discussion   . Cardiogenic shock ([place])   . AKI (acute kidney injury) ([place])   . Acute on chronic systolic (congestive) heart failure ([place])   . Acute systolic heart failure ([place]) 01/25/2016  . Left lower quadrant pain   . Interstitial lung disease ([place])   . Dyspnea 01/13/2016  . Acute on chronic combined systolic (congestive) and diastolic (congestive) heart failure ([place]) 01/13/2016  . Cough 01/13/2016  . Cardiorenal disease 01/13/2016  . Nonischemic cardiomyopathy ([place]) 12/21/2015  . Coronary artery disease 12/21/2015  . Chronic renal insufficiency, stage IV (severe) 12/21/2015  . PAF (paroxysmal atrial fibrillation) ([place]) 12/21/2015  . HCAP (healthcare-associated pneumonia) 12/21/2015  . Chronic anticoagulation 12/21/2015  . Nausea with vomiting  12/20/2015  . Influenza B 12/20/2015  . Fluid overload 12/20/2015  . Chronic combined systolic and diastolic congestive heart failure ([place]) 12/20/2015  . Adnexal cyst - left 12/16/2015  . Protein-calorie malnutrition, moderate ([place]) 12/16/2015  . Abdominal pain   . Lactic acidosis   . HLD (hyperlipidemia) 12/15/2015  . Hypothyroidism 12/15/2015  . RA (rheumatoid arthritis) ([place]) 12/15/2015  . Hyperkalemia 12/15/2015  . Hyponatremia 12/15/2015  . Chronic atrial fibrillation ([place]) 12/15/2015  . Hypotension 12/15/2015  . Arterial hypotension   . Pain of upper abdomen   . Diabetes mellitus, type 2 ([place]) 12/06/2015  . Acute renal failure superimposed on stage 3 chronic kidney disease ([place]) 12/06/2015  . Atrial fibrillation with rapid ventricular response ([place])   . ARF (acute renal failure) ([place])   . Elevated LFTs   . Positive D dimer   . Mitral regurgitation   . Tricuspid regurgitation   . Pulmonary hypertension ([place])   . Heart failure ([place]) 11/22/2015  . Acute congestive heart failure ([place]) 11/22/2015  . Hypertension 10/11/2012  . Cellulitis of knee, right 10/11/2012  . DM (diabetes mellitus) ([place]) 10/11/2012  . Dermatitis 10/11/2012    Palliative Care Assessment & Plan   Patient Profile:    Assessment:  End-stage HF    severe valvular disease    severe functional decline over past 3-6 months.  ?recently diagnosed with ILD  Recommendations/Plan: Agree with Dr. Haroldine Laws with regards to pursuing comfort as singular goal.  Working to transition out of ICU and follow her clinical course.   She is currently doing well from symptom management standpoint. Plan for medications as needed for comfort if symptoms arise.  Pain/SOB: Morphine as needed.  Only one prn dose in last 24 hours.  Continue to monitor and plan to escalate as needed to ensure comfort.  Anxiety: Currently not an issue. Will add on ativan for use as needed.    Code Status:    Code Status Orders         Start     Ordered   01/27/16 1337  Do not attempt resuscitation (DNR)   Continuous    Question Answer Comment  In the event of cardiac or respiratory ARREST Do not call a "code blue"   In the event of cardiac or respiratory ARREST Do not perform Intubation, CPR, defibrillation or ACLS   In the event of cardiac or respiratory ARREST Use medication by any route, position, wound care, and other measures to relive pain and suffering. May use oxygen, suction and manual treatment of airway obstruction as needed for comfort.      01/27/16 1336    Code Status History    Date Active Date Inactive Code Status Order ID Comments User Context   01/25/2016  2:26 AM 01/27/2016  1:36 PM Full Code [id number]  Raylene Miyamoto, MD ED   01/13/2016  9:56 PM 01/18/2016 10:23 PM Full Code [id number]  Roney Jaffe, MD Inpatient   12/15/2015  8:04 PM 12/20/2015  5:34 PM Full Code [id number]  Ivor Costa, MD ED   11/22/2015 11:46 PM 11/30/2015  2:13 PM Full Code [id number]  Norval Morton, MD ED    Advance Directive Documentation        Most Recent Value   Type of Advance Directive  Out of facility DNR (pink MOST or yellow form)   Pre-existing out of facility DNR order (yellow form or pink MOST form)  Pink MOST form placed in chart (order not valid for inpatient use)   "MOST" Form in Place?         Prognosis:   Possibly hours to days depending on clinical course.  Milrinone stopped prior to my encounter.  Currently in rhythm and she is alert and interactive.  She is, however, at high risk for acute decompensation and death.       Discharge Planning:  To Be Determined.  Plan to d/c milrinone today and follow.  She is at high risk for acute decompensation and death.  There is high likelihood that this will be a terminal admission.  If her condition stabilizes over the next 24-48 hours, will continue to work with family on best plan for discharge.  She would be a good candidate for hospice and may be well served from  inpatient hospice if she stabilizes enough to transition from hospital.  Care plan was discussed with  Patient, daughter, son, Dr. Haroldine Laws.  Thank you for allowing the Palliative Medicine Team to assist in the care of this patient.   Time In: 0915 Time Out: 0945 Total Time 30 Prolonged Time Billed  no       Greater than 50%  of this time was spent counseling and coordinating care related to the above assessment and plan.  Micheline Rough, MD [phone]  Please contact Palliative Medicine Team phone at [phone] for questions and concerns.

## 2016-02-01 NOTE — Clinical Social Work Note (Signed)
LCSW continuing to follow for disposition assistance.  Disposition: pending per Palliative  Marcelline Deist, LCSW [phone] Orthopedics: [phone] Surgical: [phone]

## 2016-02-01 NOTE — Progress Notes (Signed)
Report called to 6N

## 2016-02-01 NOTE — Progress Notes (Signed)
Advanced Heart Failure Rounding Note   Subjective:    Off norepi.  Milrinone at 0.25. Co-ox 58.6%. CVP 14 with marked v-waves    Weight stable. Creatinine elevated but stable 2.18 -> 2.51 -> 2.75 -> 2.6-> 2.7 -> 2.65 -> 2.69  She appears to be in sinus rhythm in the 70s this am. Has bradycardic episodes into 30s. She is now comfort care.   Off amio and heparin gtts.   Plan on stopping milrinone today.    Had a dose of morphine last night and slept through most of the night with minimal cough.   She diuresed >3 L yesterday and is down 8 lbs. CVP 4  Objective:   Weight Range:  Vital Signs:   Temp:  [97.4 F (36.3 C)-98 F (36.7 C)] 97.4 F (36.3 C) (05/23 0339) Pulse Rate:  [28-120] 76 (05/23 0600) BP: (89-109)/(52-83) 94/59 mmHg (05/23 0600) SpO2:  [97 %-100 %] 100 % (05/23 0600) Weight:  [172 lb 9.9 oz (78.3 kg)] 172 lb 9.9 oz (78.3 kg) (05/23 0339) Last BM Date: 01/31/16  Weight change: Filed Weights   01/30/16 0447 01/31/16 0400 02/01/16 0339  Weight: 180 lb 12.4 oz (82 kg) 181 lb (82.1 kg) 172 lb 9.9 oz (78.3 kg)    Intake/Output:   Intake/Output Summary (Last 24 hours) at 02/01/16 0718 Last data filed at 02/01/16 0600  Gross per 24 hour  Intake  649.7 ml  Output   4100 ml  Net -3450.3 ml     Physical Exam: CVP 4 , lying in bed, NAD  General: Chronically ill . No resp difficulty. On 2 liters  HEENT: normal Neck: supple. JVP 4-5.  Carotids 2+ bilat; no bruits. No thyromegaly or nodule noted.  Cor: PMI nondisplaced. Regular rate & rhythm. No rubs or 2/6 MR/TR  Loud s3 Lungs: Slight basilar crackles. Scattered rhonchi that clear with cough Abdomen: soft, non-tender, non-distended, no HSM. No bruits or masses. +BS  Extremities: no cyanosis, clubbing, rash, R and LLE trace edema into thighs with SCDs on RLE and LLE  Neuro: alert & orientedx3, cranial nerves grossly intact. moves all 4 extremities w/o difficulty. Affect pleasant  Telemetry: ?Sinus  70-80s  Labs: Basic Metabolic Panel:  Recent Labs Lab 01/26/16 0445 01/27/16 0333 01/28/16 0400 01/28/16 1300 01/28/16 1356 01/29/16 0502 01/30/16 0415 01/31/16 0315 02/01/16 0500  NA 137 133* 125* 128*  --  130* 127* 125* 127*  K 4.1 3.8 3.2* 2.8*  --  4.8 4.0 3.7 3.2*  CL 98* 95* 88* 88*  --  92* 89* 84* 84*  CO2 28 27 26   --   --  26 25 26  34*  GLUCOSE 106* 78 310* 338* 45* 107* 273* 324* 121*  BUN 52* 58* 57* 43*  --  54* 52* 49* 47*  CREATININE 2.18* 2.51* 2.75* 2.30*  --  2.64* 2.71* 2.65* 2.69*  CALCIUM 8.8* 8.4* 8.1*  --   --  8.7* 8.3* 8.5* 8.8*  MG 2.5* 2.3  --   --   --   --   --   --   --   PHOS 4.3 3.9  --   --   --   --   --   --   --     Liver Function Tests:  Recent Labs Lab 01/25/16 1500 01/27/16 0947  AST 83* 56*  ALT 199* 135*  ALKPHOS 143* 131*  BILITOT 1.7* 1.2  PROT 6.2* 5.8*  ALBUMIN 2.1* 1.9*  No results for input(s): LIPASE, AMYLASE in the last 168 hours. No results for input(s): AMMONIA in the last 168 hours.  CBC:  Recent Labs Lab 01/28/16 0400 01/28/16 1300 01/29/16 0502 01/30/16 0415 01/31/16 0315 02/01/16 0500  WBC 6.8  --  5.9 6.3 6.2 4.6  HGB 9.6* 10.9* 9.9* 9.5* 10.2* 10.2*  HCT 28.8* 32.0* 28.8* 27.9* 30.3* 29.2*  MCV 92.3  --  90.0 90.9 90.7 88.8  PLT 203  --  200 183 171 160    Cardiac Enzymes: No results for input(s): CKTOTAL, CKMB, CKMBINDEX, TROPONINI in the last 168 hours.  BNP: BNP (last 3 results)  Recent Labs  01/14/16 1436 01/15/16 0523 01/24/16 2314  BNP 1245.4* 1386.8* 2194.5*    ProBNP (last 3 results) No results for input(s): PROBNP in the last 8760 hours.    Other results:  Imaging: No results found.   Medications:     Scheduled Medications: . antiseptic oral rinse  7 mL Mouth Rinse BID  . famotidine  20 mg Oral QHS  . furosemide  80 mg Intravenous Q8H  . insulin aspart  0-5 Units Subcutaneous TID WC  . levothyroxine  88 mcg Oral QAC breakfast  . metolazone  5 mg Oral Daily   . sodium chloride flush  10-40 mL Intracatheter Q12H    Infusions: . dextrose 5 % and 0.45% NaCl 10 mL/hr at 02/01/16 0454  . milrinone 0.25 mcg/kg/min (01/31/16 2007)    PRN Medications: dextromethorphan, morphine injection, sodium chloride flush   Assessment:  1. Cardiogenic Shock 2. A/C Systolic Heart Failure  --EF 25-30% with NICM and severe MR 3. Acute A fib RVR-  4. Lactic Acidosis 5. CKD Stage IV 6. Elevated AST/ALT  7. Hypothyroidism 8. Hyperkalemia/hyponatremia 9. Hypoalbuminemia- Albumin 2.6  10. Severe MR/TR   Plan/Discussion:    Co-ox relatively stable despite stopping levophed. CVP remains elevated, although she did have some decent diuresis yesterday. Renal function elevated but stable.  Off all non-essential meds.  CVP 4.  Can start po lasix as a palliative measure tomorrow.    Plan on stopping milrinone today. Will cut back to 0.125 now and then stop in several hours. Family agrees with plan.  Has prn morphine, can increase as needed per palliative.   She actually looks slightly better this morning, which may be rally prior to end of life.  Will follow closely for decompensation.   Appreciate Palliative input. She is now DNR/DNI. Not candidate for CVVHD, home inotropes or mechanical support.   Length of Stay: 7  Graciella Freer PA-C 02/01/2016, 7:18 AM  Advanced Heart Failure Team Pager [phone] (M-F; 7a - 4p)  Please contact [facility] for night-coverage after hours (4p -7a ) and weekends on [email or web address]   Patient seen and examined with Otilio Saber, PA-C. We discussed all aspects of the encounter. I agree with the assessment and plan as stated above.   She is now off all inotropes. SBP is surprisingly stable. She has converted to NSR on amiodarone. Suspect she will decompensate again soon off inotropes. Hospice services now in place. Agree with transfer to 6N. Would watch in hospital for day or two off inotropes to see if she may need  [facility]. Discussed with Hospice team.   Bensimhon, Daniel,MD 2:50 PM

## 2016-02-01 NOTE — Progress Notes (Signed)
[facility] PROGRESS NOTE  Theresa Cortez NAT:[id number] DOB: 06-02-1930 DOA: 01/24/2016  PCP: Gwenyth Bender, MD  Brief HPI: 82 year old African-American female with a past medical history of systolic CHF, who was recently hospitalized for CHF exacerbation, presented with shortness of breath. She was admitted to the intensive care unit. Cardiology was consulted. Patient remains on inotropes. Clinically, she appears to be worsening. She remains on inotropes and vasopressors. Cardiology discussing comfort care with patient and family on a regular basis. After multiple discussions with the patient and family, she is now being transitioned to comfort care.  Past medical history:  Past Medical History  Diagnosis Date  . Hypertension   . Wound infection (HCC)     Right knee   . Hypothyroidism   . History of diverticulosis   . Hyperlipidemia   . Type II diabetes mellitus (HCC)   . History of blood transfusion X 2    w/knee replacement  . Stroke [facility]) 2000    denies residual on 11/23/2015  . Rheumatoid arthritis(714.0)   . DJD (degenerative joint disease)   . Arthritis     "hands, arms" (11/23/2015)  . CKD (chronic kidney disease), stage III   . Chronic combined systolic and diastolic heart failure (HCC)   . Atrial fibrillation (HCC)   . Interstitial lung disease (HCC)     Consultants: Cardiology/Heart failure team  Procedures: PICC line placement  Antibiotics: None  Subjective: Patient denies any complaints this morning. Her daughter is at the bedside. Patient does mention an episode of vomiting this morning. Denies any nausea currently.   Objective:  Vital Signs  Filed Vitals:   02/01/16 0500 02/01/16 0600 02/01/16 0700 02/01/16 0728  BP: 98/57 94/59 97/52    Pulse: 85 76 78   Temp:    97.3 F (36.3 C)  TempSrc:      Resp:      Height:      Weight:      SpO2: 100% 100% 98%     Intake/Output Summary (Last 24 hours) at 02/01/16 0730 Last data filed at 02/01/16  0700  Gross per 24 hour  Intake  665.6 ml  Output   4100 ml  Net -3434.4 ml   Filed Weights   01/30/16 0447 01/31/16 0400 02/01/16 0339  Weight: 82 kg (180 lb 12.4 oz) 82.1 kg (181 lb) 78.3 kg (172 lb 9.9 oz)    General appearance: alert, cooperative, appears stated age and no distress Resp: crackles bilaterally. No wheezing. No rhonchi. Cardio: S1, S2 is Regular. No S3, S4. Systolic murmur appreciated over the precordium. GI: soft, non-tender; bowel sounds normal; no masses,  no organomegaly Extremities: Minimal edema bilaterally in the lower extremities Neurologic: Awake and alert. Oriented 3. No focal neurological deficits.  Lab Results:  Data Reviewed: I have personally reviewed following labs and imaging studies  CBC:  Recent Labs Lab 01/28/16 0400 01/28/16 1300 01/29/16 0502 01/30/16 0415 01/31/16 0315 02/01/16 0500  WBC 6.8  --  5.9 6.3 6.2 4.6  HGB 9.6* 10.9* 9.9* 9.5* 10.2* 10.2*  HCT 28.8* 32.0* 28.8* 27.9* 30.3* 29.2*  MCV 92.3  --  90.0 90.9 90.7 88.8  PLT 203  --  200 183 171 160   Basic Metabolic Panel:  Recent Labs Lab 01/26/16 0445 01/27/16 0333 01/28/16 0400 01/28/16 1300 01/28/16 1356 01/29/16 0502 01/30/16 0415 01/31/16 0315 02/01/16 0500  NA 137 133* 125* 128*  --  130* 127* 125* 127*  K 4.1 3.8 3.2* 2.8*  --  4.8 4.0 3.7 3.2*  CL 98* 95* 88* 88*  --  92* 89* 84* 84*  CO2 28 27 26   --   --  26 25 26  34*  GLUCOSE 106* 78 310* 338* 45* 107* 273* 324* 121*  BUN 52* 58* 57* 43*  --  54* 52* 49* 47*  CREATININE 2.18* 2.51* 2.75* 2.30*  --  2.64* 2.71* 2.65* 2.69*  CALCIUM 8.8* 8.4* 8.1*  --   --  8.7* 8.3* 8.5* 8.8*  MG 2.5* 2.3  --   --   --   --   --   --   --   PHOS 4.3 3.9  --   --   --   --   --   --   --    GFR: Estimated Creatinine Clearance: 16.7 mL/min (by C-G formula based on Cr of 2.69).  Liver Function Tests:  Recent Labs Lab 01/25/16 1500 01/27/16 0947  AST 83* 56*  ALT 199* 135*  ALKPHOS 143* 131*  BILITOT 1.7*  1.2  PROT 6.2* 5.8*  ALBUMIN 2.1* 1.9*   CBG:  Recent Labs Lab 01/30/16 2147 01/31/16 0810 01/31/16 1121 01/31/16 1647 01/31/16 2114  GLUCAP 168* 152* 183* 148* 129*   Urine analysis:    Component Value Date/Time   COLORURINE AMBER* 01/24/2016 2118   APPEARANCEUR CLOUDY* 01/24/2016 2118   LABSPEC 1.018 01/24/2016 2118   PHURINE 5.0 01/24/2016 2118   GLUCOSEU NEGATIVE 01/24/2016 2118   HGBUR NEGATIVE 01/24/2016 2118   BILIRUBINUR NEGATIVE 01/24/2016 2118   KETONESUR NEGATIVE 01/24/2016 2118   PROTEINUR NEGATIVE 01/24/2016 2118   UROBILINOGEN 0.2 01/27/2011 1919   NITRITE NEGATIVE 01/24/2016 2118   LEUKOCYTESUR NEGATIVE 01/24/2016 2118    Recent Results (from the past 240 hour(s))  Urine culture     Status: None   Collection Time: 01/24/16  9:18 PM  Result Value Ref Range Status   Specimen Description URINE, CATHETERIZED  Final   Special Requests NONE  Final   Culture NO GROWTH Performed at [facility]   Final   Report Status 01/26/2016 FINAL  Final  Culture, blood (routine x 2)     Status: None   Collection Time: 01/25/16  2:48 AM  Result Value Ref Range Status   Specimen Description BLOOD LEFT HAND  Final   Special Requests IN PEDIATRIC BOTTLE [facility]  Final   Culture   Final    NO GROWTH 5 DAYS Performed at [facility]    Report Status 01/30/2016 FINAL  Final      Radiology Studies: No results found.   Medications:  Scheduled: . antiseptic oral rinse  7 mL Mouth Rinse BID  . famotidine  20 mg Oral QHS  . furosemide  80 mg Intravenous Q8H  . insulin aspart  0-5 Units Subcutaneous TID WC  . metolazone  5 mg Oral Daily  . sodium chloride flush  10-40 mL Intracatheter Q12H   Continuous: . dextrose 5 % and 0.45% NaCl 10 mL/hr at 02/01/16 0454  . milrinone 0.25 mcg/kg/min (01/31/16 2007)   02/02/16, morphine injection, sodium chloride flush  Assessment/Plan:  Active Problems:   Acute systolic heart failure (HCC)   Acute  respiratory failure (HCC)   Encounter for palliative care   Goals of care, counseling/discussion   Cardiogenic shock (HCC)   AKI (acute kidney injury) (HCC)   Acute on chronic systolic (congestive) heart failure (HCC)    Acute systolic CHF with moderate to severe mitral regurgitation Ejection fraction  is 25-30% based on recent echocardiogram. Heart failure team is following. Patient was started on transition to comfort care yesterday. Vasopressors were discontinued. Today, the plan is for discontinuation of milrinone. She remains on IV diuretics. Palliative medicine is also following. She also was taken off of the amiodarone infusion. Creatinine remains elevated.  Acute on chronic kidney disease, stage III/hyponatremia Creatinine is higher than her baseline. But stable. She is also hyponatremic. This is predominantly cardiorenal. Urine output did improve with initiation of diuretics. Patient is not a candidate for dialysis.  Transaminitis Most likely due to shock liver. LFTs were stable when last checked.  Chronic atrial fibrillation with RVR Heart rate remained poorly controlled. The patient was started on amiodarone infusion. Now. Plan is comfort care. Amiodarone has been discontinued. Chads 2 score is at least 6. She was on Apixaban at home. She was placed on IV heparin during this hospitalization. Now that she is comfort care, this has been discontinued as well.   Hypothyroidism Continue levothyroxine.  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 Continue sliding scale coverage.  DVT Prophylaxis: Comfort care  Code Status: DO NOT RESUSCITATE  Family Communication: Discussed with the patient and her daughter. Disposition Plan: Plan is for milrinone to be discontinued later today. She could eventually be transitioned to the floor by the end of the day today or tomorrow. Palliative medicine and heart failure team continue to follow. It is felt that she may not  survive this hospitalization. Depending on her condition in the next 24-48 hours, residential hospice may have to be considered.     LOS: 7 days   [facility]  [facility] Pager [phone] 02/01/2016, 7:30 AM  If 7PM-7AM, please contact night-coverage at [email or web address], password [facility]

## 2016-02-01 NOTE — Progress Notes (Signed)
Physical Therapy Discharge Patient Details Name: Theresa Cortez MRN: [id number] DOB: Jan 23, 1930 Today's Date: 02/01/2016 Time:  -     Patient discharged from PT services secondary to medical decline - will need to re-order PT to resume therapy services.  Please see latest therapy progress note for current level of functioning and progress toward goals.    Progress and discharge plan discussed with patient and/or caregiver: MD discontinued PT via order  GP     [name]  Pager [phone]  02/01/2016, 10:39 AM

## 2016-02-02 ENCOUNTER — Encounter: Payer: Self-pay | Admitting: *Deleted

## 2016-02-02 ENCOUNTER — Ambulatory Visit: Payer: Medicare Other | Admitting: Cardiovascular Disease

## 2016-02-02 DIAGNOSIS — R7989 Other specified abnormal findings of blood chemistry: Secondary | ICD-10-CM

## 2016-02-02 DIAGNOSIS — N189 Chronic kidney disease, unspecified: Secondary | ICD-10-CM

## 2016-02-02 DIAGNOSIS — I130 Hypertensive heart and chronic kidney disease with heart failure and stage 1 through stage 4 chronic kidney disease, or unspecified chronic kidney disease: Principal | ICD-10-CM

## 2016-02-02 DIAGNOSIS — E871 Hypo-osmolality and hyponatremia: Secondary | ICD-10-CM

## 2016-02-02 DIAGNOSIS — I9589 Other hypotension: Secondary | ICD-10-CM

## 2016-02-02 DIAGNOSIS — E785 Hyperlipidemia, unspecified: Secondary | ICD-10-CM

## 2016-02-02 DIAGNOSIS — I509 Heart failure, unspecified: Secondary | ICD-10-CM

## 2016-02-02 DIAGNOSIS — E119 Type 2 diabetes mellitus without complications: Secondary | ICD-10-CM

## 2016-02-02 DIAGNOSIS — I2510 Atherosclerotic heart disease of native coronary artery without angina pectoris: Secondary | ICD-10-CM

## 2016-02-02 DIAGNOSIS — I429 Cardiomyopathy, unspecified: Secondary | ICD-10-CM

## 2016-02-02 LAB — CBC
HEMATOCRIT: 31.4 % — AB (ref 36.0–46.0)
Hemoglobin: 10.4 g/dL — ABNORMAL LOW (ref 12.0–15.0)
MCH: 30.7 pg (ref 26.0–34.0)
MCHC: 33.1 g/dL (ref 30.0–36.0)
MCV: 92.6 fL (ref 78.0–100.0)
Platelets: 153 10*3/uL (ref 150–400)
RBC: 3.39 MIL/uL — ABNORMAL LOW (ref 3.87–5.11)
RDW: 16.4 % — AB (ref 11.5–15.5)
WBC: 5 10*3/uL (ref 4.0–10.5)

## 2016-02-02 LAB — CARBOXYHEMOGLOBIN
Carboxyhemoglobin: 1.8 % — ABNORMAL HIGH (ref 0.5–1.5)
Methemoglobin: 0.6 % (ref 0.0–1.5)
O2 SAT: 67 %
Total hemoglobin: 10.9 g/dL — ABNORMAL LOW (ref 12.0–16.0)

## 2016-02-02 LAB — BASIC METABOLIC PANEL
ANION GAP: 10 (ref 5–15)
BUN: 45 mg/dL — ABNORMAL HIGH (ref 6–20)
CHLORIDE: 83 mmol/L — AB (ref 101–111)
CO2: 39 mmol/L — AB (ref 22–32)
Calcium: 9 mg/dL (ref 8.9–10.3)
Creatinine, Ser: 2.46 mg/dL — ABNORMAL HIGH (ref 0.44–1.00)
GFR calc non Af Amer: 17 mL/min — ABNORMAL LOW (ref >60)
GFR, EST AFRICAN AMERICAN: 20 mL/min — AB (ref >60)
Glucose, Bld: 69 mg/dL (ref 65–99)
Potassium: 3.1 mmol/L — ABNORMAL LOW (ref 3.5–5.1)
Sodium: 132 mmol/L — ABNORMAL LOW (ref 135–145)

## 2016-02-02 MED ORDER — BOOST / RESOURCE BREEZE PO LIQD
1.0000 | Freq: Two times a day (BID) | ORAL | Status: DC
  Administered 2016-02-03 – 2016-02-04 (×3): 1 via ORAL

## 2016-02-02 MED ORDER — HYDRALAZINE HCL 25 MG PO TABS: 12.5000 mg | ORAL_TABLET | Freq: Three times a day (TID) | ORAL | Status: DC

## 2016-02-02 MED ORDER — POTASSIUM CHLORIDE CRYS ER 20 MEQ PO TBCR
40.0000 meq | EXTENDED_RELEASE_TABLET | Freq: Once | ORAL | Status: AC
  Administered 2016-02-02: 40 meq via ORAL
  Filled 2016-02-02: qty 4

## 2016-02-02 MED ORDER — AMIODARONE HCL 200 MG PO TABS
200.0000 mg | ORAL_TABLET | Freq: Two times a day (BID) | ORAL | Status: DC
  Administered 2016-02-02 – 2016-02-04 (×4): 200 mg via ORAL
  Filled 2016-02-02 (×4): qty 1

## 2016-02-02 MED ORDER — TORSEMIDE 20 MG PO TABS
40.0000 mg | ORAL_TABLET | Freq: Two times a day (BID) | ORAL | Status: DC
  Administered 2016-02-03: 40 mg via ORAL
  Filled 2016-02-02 (×2): qty 2

## 2016-02-02 NOTE — Progress Notes (Signed)
Patient's b/p is 83/49- MD page for any further interventions

## 2016-02-02 NOTE — Care Management Important Message (Signed)
Important Message  Patient Details  Name: Theresa Cortez MRN: [id number] Date of Birth: July 01, 1930   Medicare Important Message Given:  Yes    Bernadette Hoit 02/02/2016, 11:50 AM

## 2016-02-02 NOTE — Progress Notes (Signed)
Nutrition Brief Note  Pt seen per request of assistant nursing director. Pt and family requesting nutritional supplement.  Wt Readings from Last 15 Encounters:  02/01/16 172 lb 9.9 oz (78.3 kg)  01/18/16 171 lb 15.3 oz (78 kg)  12/21/15 179 lb (81.194 kg)  12/20/15 183 lb 3.2 oz (83.1 kg)  12/12/15 167 lb (75.751 kg)  12/06/15 152 lb 6.4 oz (69.128 kg)  11/30/15 167 lb 8.8 oz (76 kg)  09/09/12 185 lb (83.31 kg)   82 year old African-American female with a past medical history of systolic CHF, who was recently hospitalized for CHF exacerbation, presented with shortness of breath.  Chart reviewed. Palliative care team following and plan to transition towards comfort care.   Spoke with pt and daughter at bedside. They report pt was on a protein supplement PTA; after further discussion, identified as Medpass supplement, which we do not have on hospital formulary. Discussed other options on formulary; pt does not do well with milky-based products such as Ensure. Pt trialed a peach flavored Boost Breeze, which she enjoyed. Will order Boost Breeze po BID, each supplement provides 250 kcal and 9 grams of protein, per pt request. Meal completion 10-25%. RD suspects continued nutritional decline given transition to comfort care status.   Case discussed with RN and Chiropodist.   Body mass index is 27.87 kg/(m^2). Patient meets criteria for overweight based on current BMI.   Current diet order is 2 gram sodium, patient is consuming approximately 10-25%% of meals at this time. Labs and medications reviewed.   No further nutrition interventions warranted at this time. If nutrition issues arise, please consult RD.   Delesa Kawa A. Mayford Knife, RD, LDN, CDE Pager: [phone] After hours Pager: [phone]

## 2016-02-02 NOTE — Progress Notes (Signed)
Daily Progress Note   Patient Name: Theresa Cortez       Date: 02/02/2016 DOB: 1930/03/22  Age: 82 y.o. MRN#: [id number] Attending Physician: Marja Kays, MD Primary Care Physician: Rogers Blocker, MD Admit Date: 01/24/2016  Reason for Consultation/Follow-up: Establishing goals of care  Subjective: Met this AM with patient and her daughter.  She understands that there is high likelihood that her condition will continue to decompensate.  Reports that she wants to live each day the best that she can and focus on spending time with her family.  She would like to try to get out of the hospital but reports that her children help her in making medical decisions.  We discussed hospice today including residential hospice.  She would like to arrange for time tomorrow to have opportunity to have family speak over the phone to ask any questions and help develop plan.  I recommended consideration for residential hospice.    Length of Stay: 8  Current Medications: Scheduled Meds:  . amiodarone  200 mg Oral BID  . antiseptic oral rinse  7 mL Mouth Rinse BID  . feeding supplement  1 Container Oral BID BM  . hydrALAZINE  12.5 mg Oral Q8H  . potassium chloride  20 mEq Oral BID  . torsemide  40 mg Oral BID    Continuous Infusions:    PRN Meds: dextromethorphan, LORazepam, morphine injection, sodium chloride flush  Physical Exam         Weak, lying in bed but participates in conversation, chronically ill Shallow breathing scattered crackles Regular Abdomen soft Has peripheral edema Opens eyes, converses appropriately, appears tired.   Vital Signs: BP 82/49 mmHg  Pulse 86  Temp(Src) 97.5 F (36.4 C) (Oral)  Resp 17  Ht '5\' 6"'$  (1.676 m)  Wt 78.2 kg (172 lb 6.4 oz)  BMI 27.84 kg/m2  SpO2  100% SpO2: SpO2: 100 % O2 Device: O2 Device: Nasal Cannula O2 Flow Rate: O2 Flow Rate (L/min): 2 L/min  Intake/output summary:   Intake/Output Summary (Last 24 hours) at 02/02/16 2246 Last data filed at 02/02/16 1845  Gross per 24 hour  Intake    510 ml  Output   2100 ml  Net  -1590 ml   LBM: Last BM Date: 01/31/16 Baseline Weight: Weight: 78.4 kg (172 lb  13.5 oz) Most recent weight: Weight: 78.2 kg (172 lb 6.4 oz)       Palliative Assessment/Data:    Flowsheet Rows        Most Recent Value   Intake Tab    Referral Department  Cardiology   Unit at Time of Referral  ICU   Palliative Care Primary Diagnosis  Cardiac   Date Notified  01/26/16   Palliative Care Type  Return patient Palliative Care   Reason for referral  Clarify Goals of Care   Date of Admission  01/24/16   Date first seen by Palliative Care  01/26/16   # of days Palliative referral response time  0 Day(s)   # of days IP prior to Palliative referral  2   Clinical Assessment    Palliative Performance Scale Score  30%   Pain Max last 24 hours  5   Pain Min Last 24 hours  4   Dyspnea Max Last 24 Hours  4   Dyspnea Min Last 24 hours  3   Psychosocial & Spiritual Assessment    Palliative Care Outcomes    Patient/Family meeting held?  Yes   Who was at the meeting?  patient    Palliative Care follow-up planned  Yes, Facility      Patient Active Problem List   Diagnosis Date Noted  . Encounter for palliative care   . Goals of care, counseling/discussion   . AKI (acute kidney injury) (HCC)   . Acute on chronic systolic (congestive) heart failure (HCC)   . Interstitial lung disease (HCC)   . Cardiorenal disease 01/13/2016  . Nonischemic cardiomyopathy (HCC) 12/21/2015  . Coronary artery disease 12/21/2015  . Chronic renal insufficiency, stage IV (severe) 12/21/2015  . Fluid overload 12/20/2015  . Chronic combined systolic and diastolic congestive heart failure (HCC) 12/20/2015  . Adnexal cyst - left  12/16/2015  . Protein-calorie malnutrition, moderate (HCC) 12/16/2015  . HLD (hyperlipidemia) 12/15/2015  . Hypothyroidism 12/15/2015  . RA (rheumatoid arthritis) (HCC) 12/15/2015  . Hyponatremia 12/15/2015  . Chronic atrial fibrillation (HCC) 12/15/2015  . Hypotension 12/15/2015  . Arterial hypotension   . Elevated LFTs   . Positive D dimer   . Mitral regurgitation   . Tricuspid regurgitation   . Pulmonary hypertension (HCC)   . DM type 2 (diabetes mellitus, type 2) (HCC) 10/11/2012    Palliative Care Assessment & Plan   Patient Profile:    Assessment:  End-stage HF    severe valvular disease    severe functional decline over past 3-6 months.  ?recently diagnosed with ILD  Recommendations/Plan: She is currently doing well from symptom management standpoint. Plan for medications as needed for comfort if symptoms arise.  Pain/SOB: Morphine as needed. Continue to monitor and plan to escalate as needed to ensure comfort.  Anxiety: Currently not an issue. Will add on ativan for use as needed.    Code Status:    Code Status Orders        Start     Ordered   01/27/16 1337  Do not attempt resuscitation (DNR)   Continuous    Question Answer Comment  In the event of cardiac or respiratory ARREST Do not call a "code blue"   In the event of cardiac or respiratory ARREST Do not perform Intubation, CPR, defibrillation or ACLS   In the event of cardiac or respiratory ARREST Use medication by any route, position, wound care, and other measures to relive pain and suffering. May use  oxygen, suction and manual treatment of airway obstruction as needed for comfort.      01/27/16 1336    Code Status History    Date Active Date Inactive Code Status Order ID Comments User Context   01/25/2016  2:26 AM 01/27/2016  1:36 PM Full Code [id number]  Raylene Miyamoto, MD ED   01/13/2016  9:56 PM 01/18/2016 10:23 PM Full Code [id number]  Roney Jaffe, MD Inpatient   12/15/2015  8:04 PM 12/20/2015   5:34 PM Full Code [id number]  Ivor Costa, MD ED   11/22/2015 11:46 PM 11/30/2015  2:13 PM Full Code [id number]  Norval Morton, MD ED    Advance Directive Documentation        Most Recent Value   Type of Advance Directive  Out of facility DNR (pink MOST or yellow form)   Pre-existing out of facility DNR order (yellow form or pink MOST form)  Pink MOST form placed in chart (order not valid for inpatient use)   "MOST" Form in Place?         Prognosis:   I suspect that she will continue to decompensate and high likelihood that she will die in the next couple of weeks.  She is off inotrope therapy with no plan to restart and she is reaccumulating fluid.  Currently in sinus rhythm and she is alert and interactive.  She is, however, at high risk for acute decompensation and death at any point.       Discharge Planning:  To Be Determined.  She is at high risk for acute decompensation and death, but currently seems stable enough to look into option for discharge from the hospital with hospice support.  Met today with patient and her daughter.  Plan for family meeting tomorrow at 11AM to continue to work with family on best plan for discharge.  She would be a good candidate residential hospice.  Care plan was discussed with  Patient, daughter, son, Dr. Haroldine Laws.  Thank you for allowing the Palliative Medicine Team to assist in the care of this patient.   Time In: 1330 Time Out: 1400 Total Time 30 Prolonged Time Billed  no       Greater than 50%  of this time was spent counseling and coordinating care related to the above assessment and plan.  Micheline Rough, MD [phone]  Please contact Palliative Medicine Team phone at [phone] for questions and concerns.

## 2016-02-02 NOTE — Progress Notes (Signed)
Advanced Heart Failure Rounding Note   Subjective:     She is off all inotropes. Feeling better. No sob. Back in NSR. Co-ox ok.   Objective:   Weight Range:  Vital Signs:   Temp:  [97.5 F (36.4 C)] 97.5 F (36.4 C) (05/24 0628) Pulse Rate:  [75-84] 75 (05/24 1357) Resp:  [18] 18 (05/24 1357) BP: (92-93)/(48-54) 92/48 mmHg (05/24 1357) SpO2:  [100 %] 100 % (05/24 1357) Weight:  [78.2 kg (172 lb 6.4 oz)] 78.2 kg (172 lb 6.4 oz) (05/24 1357) Last BM Date: 01/31/16  Weight change: Filed Weights   01/31/16 0400 02/01/16 0339 02/02/16 1357  Weight: 82.1 kg (181 lb) 78.3 kg (172 lb 9.9 oz) 78.2 kg (172 lb 6.4 oz)    Intake/Output:   Intake/Output Summary (Last 24 hours) at 02/02/16 1610 Last data filed at 02/02/16 1357  Gross per 24 hour  Intake    630 ml  Output   1775 ml  Net  -1145 ml     Physical Exam: CVP 9 , lying in bed, NAD  General: Chronically ill . No resp difficulty. On 2 liters  HEENT: normal Neck: supple. JVP 9  Carotids 2+ bilat; no bruits. No thyromegaly or nodule noted.  Cor: PMI nondisplaced. Regular rate & rhythm. No rubs or 2/6 MR/TR  Loud s3 Lungs: Slight basilar crackles. Scattered rhonchi that clear with cough Abdomen: soft, non-tender, non-distended, no HSM. No bruits or masses. +BS  Extremities: no cyanosis, clubbing, rash, R and LLE trace edema into thighs with SCDs on RLE and LLE  Neuro: alert & orientedx3, cranial nerves grossly intact. moves all 4 extremities w/o difficulty. Affect pleasant   Labs: Basic Metabolic Panel:  Recent Labs Lab 01/27/16 0333  01/29/16 0502 01/30/16 0415 01/31/16 0315 02/01/16 0500 02/02/16 0450  NA 133*  < > 130* 127* 125* 127* 132*  K 3.8  < > 4.8 4.0 3.7 3.2* 3.1*  CL 95*  < > 92* 89* 84* 84* 83*  CO2 27  < > 26 25 26  34* 39*  GLUCOSE 78  < > 107* 273* 324* 121* 69  BUN 58*  < > 54* 52* 49* 47* 45*  CREATININE 2.51*  < > 2.64* 2.71* 2.65* 2.69* 2.46*  CALCIUM 8.4*  < > 8.7* 8.3* 8.5* 8.8*  9.0  MG 2.3  --   --   --   --   --   --   PHOS 3.9  --   --   --   --   --   --   < > = values in this interval not displayed.  Liver Function Tests:  Recent Labs Lab 01/27/16 0947  AST 56*  ALT 135*  ALKPHOS 131*  BILITOT 1.2  PROT 5.8*  ALBUMIN 1.9*   No results for input(s): LIPASE, AMYLASE in the last 168 hours. No results for input(s): AMMONIA in the last 168 hours.  CBC:  Recent Labs Lab 01/29/16 0502 01/30/16 0415 01/31/16 0315 02/01/16 0500 02/02/16 0450  WBC 5.9 6.3 6.2 4.6 5.0  HGB 9.9* 9.5* 10.2* 10.2* 10.4*  HCT 28.8* 27.9* 30.3* 29.2* 31.4*  MCV 90.0 90.9 90.7 88.8 92.6  PLT 200 183 171 160 153    Cardiac Enzymes: No results for input(s): CKTOTAL, CKMB, CKMBINDEX, TROPONINI in the last 168 hours.  BNP: BNP (last 3 results)  Recent Labs  01/14/16 1436 01/15/16 0523 01/24/16 2314  BNP 1245.4* 1386.8* 2194.5*    ProBNP (last 3  results) No results for input(s): PROBNP in the last 8760 hours.    Other results:  Imaging: No results found.   Medications:     Scheduled Medications: . amiodarone  200 mg Oral BID  . antiseptic oral rinse  7 mL Mouth Rinse BID  . feeding supplement  1 Container Oral BID BM  . hydrALAZINE  12.5 mg Oral Q8H  . potassium chloride  20 mEq Oral BID  . potassium chloride  40 mEq Oral Once  . torsemide  40 mg Oral BID    Infusions:    PRN Medications: dextromethorphan, LORazepam, morphine injection, sodium chloride flush   Assessment:  1. Cardiogenic Shock 2. A/C Systolic Heart Failure  --EF 25-30% with NICM and severe MR 3. Acute A fib RVR-  4. Lactic Acidosis 5. CKD Stage IV 6. Elevated AST/ALT  7. Hypothyroidism 8. Hyperkalemia/hyponatremia 9. Hypoalbuminemia- Albumin 2.6  10. Severe MR/TR   Plan/Discussion:    She is now off all inotropes. SBP is surprisingly stable. She has converted to NSR on amiodarone. Suspect she will decompensate again soon off inotropes, however and she is  already starting to regain fluid.  Will switch lasix to demadex. Restart po amiodarone and add low-dose hydralazine for afterload reduction as BP tolerates. Supp K+.  She is willing to go to [facility] which I think is the right option. We will continue medical therapy for her HF. Would wtch volume status and renal function one more day before d/c  Theresa Trower,MD 4:10 PM Advanced Heart Failure Team Pager [phone] (M-F; 7a - 4p)  Please contact [facility] for night-coverage after hours (4p -7a ) and weekends on [email or web address]

## 2016-02-02 NOTE — Progress Notes (Signed)
No further intervention.

## 2016-02-02 NOTE — Progress Notes (Signed)
HOSPITALIST DAILY PROGRESS NOTE Hospital Day: 8  Patient Name: Theresa Cortez Admission Date/Time: 01/24/2016  7:42 PM  Age/Sex:82 y.o.female MRN#: [id number]  DOB: August 18, 1930 Attending Provider: Janann August, MD   LOS: 8 days   PCP/Outpatient Specialists: Patient Care Team: Gwenyth Bender, MD as PCP - General (Internal Medicine) Thurmon Fair, MD as Consulting Physician (Cardiology)     Brief Narrative:  82 year old African-American female with a past medical history of systolic CHF, who was recently hospitalized for CHF exacerbation, presented with shortness of breath. She was admitted to the intensive care unit. Cardiology was consulted. Patient was on inotropes. Cardiology discussing comfort care with patient and family on a regular basis. Finally off milrinone. After multiple discussions with the patient and family, she is now being transitioned to comfort care. Palliative care also on board.    Assessment:   Active Hospital Problems   Diagnosis Date Noted  . Acute on chronic systolic (congestive) heart failure (HCC)   . Encounter for palliative care     Priority: High  . Goals of care, counseling/discussion     Priority: High  . AKI (acute kidney injury) (HCC)     Priority: High  . Hypotension 12/15/2015    Priority: High  . Cardiorenal disease 01/13/2016    Priority: Medium  . Chronic renal insufficiency, stage IV (severe) 12/21/2015    Priority: Medium  . Chronic atrial fibrillation (HCC) 12/15/2015    Priority: Medium  . Nonischemic cardiomyopathy (HCC) 12/21/2015  . Coronary artery disease 12/21/2015  . Hyponatremia 12/15/2015  . HLD (hyperlipidemia) 12/15/2015  . Hypothyroidism 12/15/2015  . RA (rheumatoid arthritis) (HCC) 12/15/2015  . Elevated LFTs   . DM type 2 (diabetes mellitus, type 2) (HCC) 10/11/2012    Resolved Hospital Problems   Diagnosis Date Noted Date Resolved  . Acute respiratory failure (HCC)  02/02/2016     Plan:  - appreciate  cardiology, discussions regarding hospice care ongoing. Palliative care also assisting. Plan for possible family meeting tomorrow to discuss long term prognosis. - Cardiology has changed her lasix to demadex and restarted her PO amiodarone and hydralazine. Apparently she stated she would be ok with [facility]. High risk of acute decompensation especially if goes back into Afib with RVR. Off milrinone. - renal function remains stable since she's been here, staying in about same range.  - sodium also stable, like related to volume overload.  - LFTs stable from 5/18 - HR is better today, hopefully holds well on PO amio; anticoagulation has been stopped given pursuing comfort care measures.  - TSH was good last month, cont levothyroxine - sugars are fair, though AM labs are low serum glucose. Would diet control especially since may be pursuing hospice.    I examined the patient and reviewed the chart, labs and data. I discussed the patient's status and plan of care with Patient, Family, and Patient's treatment team  DVT prophylaxis:  None due to comfort care measures only  Code Status: DNR Family Communication:    son Disposition Plan:  Patient and son this AM had concerns about pursuing hospice. Plan though appears that as she is stable that we may get her to [facility] (hosp[facility] care).    Consultants:   Heart failure team  Palliative care team  Procedures:   PICC line  Antimicrobials:  None   Subjective:  Patient feels well, she's in chair comfortable. No CP or SOB. Asking if she can get back on her heart medications.  Objective:  Temp:  [97.5 F (36.4 C)] 97.5 F (36.4 C) (05/24 0628) Pulse Rate:  [75-84] 75 (05/24 1357) Resp:  [18] 18 (05/24 1357) BP: (92-93)/(48-54) 92/48 mmHg (05/24 1357) SpO2:  [100 %] 100 % (05/24 1357) Weight:  [78.2 kg (172 lb 6.4 oz)] 78.2 kg (172 lb 6.4 oz) (05/24 1357) SpO2 Readings from Last 3 Encounters:  02/02/16 100%  01/18/16 98%    12/20/15 95%    Intake/Output Summary (Last 24 hours) at 02/02/16 1624 Last data filed at 02/02/16 1357  Gross per 24 hour  Intake    630 ml  Output   1775 ml  Net  -1145 ml   Filed Weights   01/31/16 0400 02/01/16 0339 02/02/16 1357  Weight: 82.1 kg (181 lb) 78.3 kg (172 lb 9.9 oz) 78.2 kg (172 lb 6.4 oz)   Body mass index is 27.84 kg/(m^2).   Physical Exam  Constitutional: She is oriented to person, place, and time. No distress.  Cardiovascular: Normal rate.  An irregularly irregular rhythm present.  Pulmonary/Chest: Effort normal and breath sounds normal. No respiratory distress. She has no wheezes.  Abdominal: Soft. Bowel sounds are normal. She exhibits no distension. There is no tenderness.  Musculoskeletal: She exhibits no edema or tenderness.  Neurological: She is alert and oriented to person, place, and time. GCS score is 15.  Skin: Skin is warm and dry. She is not diaphoretic.     Medications:  Scheduled Meds: . amiodarone  200 mg Oral BID  . antiseptic oral rinse  7 mL Mouth Rinse BID  . feeding supplement  1 Container Oral BID BM  . hydrALAZINE  12.5 mg Oral Q8H  . potassium chloride  20 mEq Oral BID  . potassium chloride  40 mEq Oral Once  . torsemide  40 mg Oral BID   Continuous Infusions:  PRN Meds:  dextromethorphan 5 mL BID PRN  LORazepam 0.5-1 mg Q4H PRN  morphine injection 1-2 mg Q30 min PRN  sodium chloride flush 10-40 mL PRN    Labs:  CBC:  Recent Labs Lab 01/29/16 0502 01/30/16 0415 01/31/16 0315 02/01/16 0500 02/02/16 0450  WBC 5.9 6.3 6.2 4.6 5.0  HGB 9.9* 9.5* 10.2* 10.2* 10.4*  HCT 28.8* 27.9* 30.3* 29.2* 31.4*  MCV 90.0 90.9 90.7 88.8 92.6  PLT 200 183 171 160 153    CBG:  Recent Labs Lab 01/30/16 2147 01/31/16 0810 01/31/16 1121 01/31/16 1647 01/31/16 2114  GLUCAP 168* 152* 183* 148* 129*    Basic Metabolic Panel:  Recent Labs Lab 01/27/16 0333  01/29/16 0502 01/30/16 0415 01/31/16 0315 02/01/16 0500  02/02/16 0450  GLUCOSE 78  < > 107* 273* 324* 121* 69  NA 133*  < > 130* 127* 125* 127* 132*  K 3.8  < > 4.8 4.0 3.7 3.2* 3.1*  CL 95*  < > 92* 89* 84* 84* 83*  CO2 27  < > 26 25 26  34* 39*  BUN 58*  < > 54* 52* 49* 47* 45*  CREATININE 2.51*  < > 2.64* 2.71* 2.65* 2.69* 2.46*  CALCIUM 8.4*  < > 8.7* 8.3* 8.5* 8.8* 9.0  MG 2.3  --   --   --   --   --   --   PHOS 3.9  --   --   --   --   --   --   < > = values in this interval not displayed.  GFR: Estimated Creatinine Clearance: 18.3 mL/min (by C-G  formula based on Cr of 2.46).  Liver Function Tests:  Recent Labs Lab 01/27/16 0947  AST 56*  ALT 135*  ALKPHOS 131*  BILITOT 1.2  PROT 5.8*  ALBUMIN 1.9*    BNP (last 3 results)  Recent Labs  01/14/16 1436 01/15/16 0523 01/24/16 2314  BNP 1245.4* 1386.8* 2194.5*    Latest HbA1C/Lipids/Thyroid/Anemia: Lab Results  Component Value Date   HGBA1C 7.3* 11/23/2015   CHOL 90 12/16/2015   HDL 40* 12/16/2015   LDLCALC 37 12/16/2015   TRIG 67 12/16/2015   CHOLHDL 2.3 12/16/2015   TSH 2.600 12/18/2015    Sepsis Labs: Lab Results  Component Value Date   LATICACIDVEN 2.0 01/25/2016   LATICACIDVEN 8.2* 01/25/2016   LATICACIDVEN 12.0* 01/24/2016    Recent Results (from the past 240 hour(s))  Urine culture     Status: None   Collection Time: 01/24/16  9:18 PM  Result Value Ref Range Status   Specimen Description URINE, CATHETERIZED  Final   Special Requests NONE  Final   Culture NO GROWTH Performed at [facility]   Final   Report Status 01/26/2016 FINAL  Final  Culture, blood (routine x 2)     Status: None   Collection Time: 01/25/16  2:48 AM  Result Value Ref Range Status   Specimen Description BLOOD LEFT HAND  Final   Special Requests IN PEDIATRIC BOTTLE [facility]  Final   Culture   Final    NO GROWTH 5 DAYS Performed at [facility]    Report Status 01/30/2016 FINAL  Final     Radiology Studies: No results found.  Time spent: 35  minutes  Janann August, MD [facility]  If 7PM-7AM, please contact night-coverage [email or web address] Password [facility] 02/02/2016, 4:24 PM

## 2016-02-03 DIAGNOSIS — I480 Paroxysmal atrial fibrillation: Secondary | ICD-10-CM | POA: Diagnosis present

## 2016-02-03 LAB — COMPREHENSIVE METABOLIC PANEL
ALBUMIN: 2 g/dL — AB (ref 3.5–5.0)
ALK PHOS: 106 U/L (ref 38–126)
ALT: 47 U/L (ref 14–54)
AST: 36 U/L (ref 15–41)
Anion gap: 8 (ref 5–15)
BUN: 43 mg/dL — ABNORMAL HIGH (ref 6–20)
CALCIUM: 8.6 mg/dL — AB (ref 8.9–10.3)
CHLORIDE: 86 mmol/L — AB (ref 101–111)
CO2: 36 mmol/L — AB (ref 22–32)
CREATININE: 2.54 mg/dL — AB (ref 0.44–1.00)
GFR calc Af Amer: 19 mL/min — ABNORMAL LOW (ref >60)
GFR calc non Af Amer: 16 mL/min — ABNORMAL LOW (ref >60)
GLUCOSE: 151 mg/dL — AB (ref 65–99)
Potassium: 4 mmol/L (ref 3.5–5.1)
SODIUM: 130 mmol/L — AB (ref 135–145)
Total Bilirubin: 1.2 mg/dL (ref 0.3–1.2)
Total Protein: 6.3 g/dL — ABNORMAL LOW (ref 6.5–8.1)

## 2016-02-03 MED ORDER — METOLAZONE 2.5 MG PO TABS
2.5000 mg | ORAL_TABLET | Freq: Once | ORAL | Status: AC
  Administered 2016-02-03: 2.5 mg via ORAL
  Filled 2016-02-03: qty 1

## 2016-02-03 MED ORDER — POTASSIUM CHLORIDE CRYS ER 20 MEQ PO TBCR
20.0000 meq | EXTENDED_RELEASE_TABLET | Freq: Two times a day (BID) | ORAL | Status: DC
  Administered 2016-02-03 – 2016-02-04 (×2): 20 meq via ORAL
  Filled 2016-02-03 (×2): qty 1

## 2016-02-03 MED ORDER — TORSEMIDE 20 MG PO TABS
60.0000 mg | ORAL_TABLET | Freq: Two times a day (BID) | ORAL | Status: DC
  Administered 2016-02-03 – 2016-02-04 (×2): 60 mg via ORAL
  Filled 2016-02-03 (×2): qty 3

## 2016-02-03 MED ORDER — POTASSIUM CHLORIDE CRYS ER 20 MEQ PO TBCR: 20.0000 meq | EXTENDED_RELEASE_TABLET | Freq: Every day | ORAL | Status: DC

## 2016-02-03 NOTE — Clinical Social Work Note (Addendum)
CSW received referral for patient's family wanting [facility], CSW contacted patient's daughter Westley Hummer [phone] who confirmed they wanted [facility].  [facility] will review patient's information and let CSW know.  CSW to continue to follow patient's progress.  3:15pm  CSW received phone call from [facility] who said they do not have a bed available today, but they will follow up with this CSW tomorrow.  CSW to continue to follow patient's progress.  Ervin Knack. Alexys Lobello, MSW, Theresia Majors [phone] 02/03/2016 12:39 PM

## 2016-02-03 NOTE — Progress Notes (Signed)
Advanced Heart Failure Rounding Note   Subjective:    She is off all inotropes. Tired today. Had tachypalpitations this am but better now. Feels constipated.   Now on torsemide. Weight on changed.   Objective:   Weight Range:  Vital Signs:   Temp:  [97.5 F (36.4 C)-97.8 F (36.6 C)] 97.8 F (36.6 C) (05/25 1431) Pulse Rate:  [83-86] 83 (05/25 1431) Resp:  [17-18] 18 (05/25 1431) BP: (79-93)/(49-56) 93/54 mmHg (05/25 1431) SpO2:  [100 %] 100 % (05/25 1431) Weight:  [78.1 kg (172 lb 2.9 oz)] 78.1 kg (172 lb 2.9 oz) (05/25 0637) Last BM Date: 02/01/16  Weight change: Filed Weights   02/01/16 0339 02/02/16 1357 02/03/16 0637  Weight: 78.3 kg (172 lb 9.9 oz) 78.2 kg (172 lb 6.4 oz) 78.1 kg (172 lb 2.9 oz)    Intake/Output:   Intake/Output Summary (Last 24 hours) at 02/03/16 1452 Last data filed at 02/03/16 1331  Gross per 24 hour  Intake    460 ml  Output   1025 ml  Net   -565 ml     Physical Exam: CVP 9 , lying in bed, NAD  General: Chronically ill . No resp difficulty. On 2 liters  HEENT: normal Neck: supple. JVP 9  Carotids 2+ bilat; no bruits. No thyromegaly or nodule noted.  Cor: PMI nondisplaced. Regular rate & rhythm. No rubs or 2/6 MR/TR  + s3 Lungs: Slight basilar crackles. Scattered rhonchi that clear with cough Abdomen: soft, non-tender, non-distended, no HSM. No bruits or masses. +BS  Extremities: no cyanosis, clubbing, rash, R and LLE trace edema into thighs with SCDs on RLE and LLE  Neuro: alert & orientedx3, cranial nerves grossly intact. moves all 4 extremities w/o difficulty. Affect pleasant   Labs: Basic Metabolic Panel:  Recent Labs Lab 01/30/16 0415 01/31/16 0315 02/01/16 0500 02/02/16 0450 02/03/16 0510  NA 127* 125* 127* 132* 130*  K 4.0 3.7 3.2* 3.1* 4.0  CL 89* 84* 84* 83* 86*  CO2 25 26 34* 39* 36*  GLUCOSE 273* 324* 121* 69 151*  BUN 52* 49* 47* 45* 43*  CREATININE 2.71* 2.65* 2.69* 2.46* 2.54*  CALCIUM 8.3* 8.5* 8.8*  9.0 8.6*    Liver Function Tests:  Recent Labs Lab 02/03/16 0510  AST 36  ALT 47  ALKPHOS 106  BILITOT 1.2  PROT 6.3*  ALBUMIN 2.0*   No results for input(s): LIPASE, AMYLASE in the last 168 hours. No results for input(s): AMMONIA in the last 168 hours.  CBC:  Recent Labs Lab 01/29/16 0502 01/30/16 0415 01/31/16 0315 02/01/16 0500 02/02/16 0450  WBC 5.9 6.3 6.2 4.6 5.0  HGB 9.9* 9.5* 10.2* 10.2* 10.4*  HCT 28.8* 27.9* 30.3* 29.2* 31.4*  MCV 90.0 90.9 90.7 88.8 92.6  PLT 200 183 171 160 153    Cardiac Enzymes: No results for input(s): CKTOTAL, CKMB, CKMBINDEX, TROPONINI in the last 168 hours.  BNP: BNP (last 3 results)  Recent Labs  01/14/16 1436 01/15/16 0523 01/24/16 2314  BNP 1245.4* 1386.8* 2194.5*    ProBNP (last 3 results) No results for input(s): PROBNP in the last 8760 hours.    Other results:  Imaging: No results found.   Medications:     Scheduled Medications: . amiodarone  200 mg Oral BID  . antiseptic oral rinse  7 mL Mouth Rinse BID  . feeding supplement  1 Container Oral BID BM  . hydrALAZINE  12.5 mg Oral Q8H  . [START ON  02/04/2016] potassium chloride  20 mEq Oral Daily  . torsemide  40 mg Oral BID    Infusions:    PRN Medications: dextromethorphan, LORazepam, morphine injection, sodium chloride flush   Assessment:  1. Cardiogenic Shock 2. A/C Systolic Heart Failure  --EF 25-30% with NICM and severe MR 3. Acute A fib RVR-  4. Lactic Acidosis 5. CKD Stage IV 6. Elevated AST/ALT  7. Hypothyroidism 8. Hyperkalemia/hyponatremia 9. Hypoalbuminemia- Albumin 2.6  10. Severe MR/TR   Plan/Discussion:    She is now off all inotropes. BP low but stable. Unable to tolerate hydralazine. Will stop. Suspect she may have had brief AF this am. Now back in NSR.   Volume remains up on demadex. Will go to 60 bid and give one dose metolazone. Continue amiodarone.   She is willing to go to [facility] which I think is the  right option as I suspect she will continue to deteriorate.   Bensimhon, Daniel,MD 2:52 PM Advanced Heart Failure Team Pager [phone] (M-F; 7a - 4p)  Please contact [facility] for night-coverage after hours (4p -7a ) and weekends on [email or web address]

## 2016-02-03 NOTE — Progress Notes (Signed)
HOSPITALIST DAILY PROGRESS NOTE Hospital Day: 9  Patient Name: Theresa Cortez Admission Date/Time: 01/24/2016  7:42 PM  Age/Sex:82 y.o.female MRN#: [id number]  DOB: [birth year] Attending Provider: Janann August, MD   LOS: 9 days   PCP/Outpatient Specialists: Patient Care Team: Gwenyth Bender, MD as PCP - General (Internal Medicine) Thurmon Fair, MD as Consulting Physician (Cardiology)     Brief Narrative:  82 year old African-American female with a past medical history of systolic CHF, who was recently hospitalized for CHF exacerbation, presented with shortness of breath. She was admitted to the intensive care unit. Cardiology was consulted. Patient was on inotropes. Cardiology discussing comfort care with patient and family on a regular basis. Finally off milrinone. After multiple discussions with the patient and family, she is now being transitioned to comfort care. Palliative care also on board. Hopefully bed at [facility] tomorrow. Current cardiac meds are more for palliation.    Assessment:   Active Hospital Problems   Diagnosis Date Noted  . Acute on chronic systolic (congestive) heart failure (HCC)   . Encounter for palliative care     Priority: High  . Goals of care, counseling/discussion     Priority: High  . AKI (acute kidney injury) (HCC)     Priority: High  . Hypotension 12/15/2015    Priority: High  . Cardiorenal disease 01/13/2016    Priority: Medium  . Chronic renal insufficiency, stage IV (severe) 12/21/2015    Priority: Medium  . PAF (paroxysmal atrial fibrillation) (HCC)   . Nonischemic cardiomyopathy (HCC) 12/21/2015  . Coronary artery disease 12/21/2015  . Hyponatremia 12/15/2015  . HLD (hyperlipidemia) 12/15/2015  . Hypothyroidism 12/15/2015  . RA (rheumatoid arthritis) (HCC) 12/15/2015  . DM type 2 (diabetes mellitus, type 2) (HCC) 10/11/2012    Resolved Hospital Problems   Diagnosis Date Noted Date Resolved  . Acute respiratory failure (HCC)   02/02/2016  . Elevated LFTs  02/03/2016     Plan:  - appreciate cardiology and Palliative care, multiple discussions with family regarding her long term prognosis, family has decided on Hospice care at [facility]. No beds today, follow up with SW regarding discharge, hopefully bed tomorrow.  - Cardiology has changed her lasix to demadex - increased from 40 bid to 60 bid, minimal output and no change in weight. Also giving x1 metolazone. restarted her PO amiodarone and hydralazine - due to hypotension has been dc'd.  - High risk of acute decompensation especially if goes back into Afib with RVR. Off milrinone. I did advise to son and patient that given her weak heart, the medications we need her to be on will also affect blood pressure. She is very hypotensive. Also her renal failure contributes to fluid overload as well. Very difficult balance and it is appropriate to pursue comfort care measures only.  - renal function remains stable since she's been here, staying in about same range.  - sodium also stable, like related to volume overload.  - LFTs stable  - HR is better today, hopefully holds well on PO amio; anticoagulation has been stopped given pursuing comfort care measures.  - TSH was good last month, cont levothyroxine - sugars are fair, though AM labs are low serum glucose. Would diet control especially since may be pursuing hospice.  - appreciate everyone's efforts in pursuing hospice care with patient and family!  I examined the patient and reviewed the chart, labs and data. I discussed the patient's status and plan of care with Patient, Family, and Patient's  treatment team  DVT prophylaxis:  None due to comfort care measures only  Code Status: DNR Family Communication:   son Disposition Plan:  Hopefully discharge to [facility] tomorrow if bed available.    Consultants:   Heart failure team  Palliative care team  Procedures:    PICC  Antimicrobials:  None  Subjective:  Patient had no complaints of any CP, SOB, palpitations when seen earlier this afternoon. Son was at bedside and reported no issues. Called by nurse yesterday with Low BP, though asymptomatic, advised to just monitor.    Objective:  Temp:  [97.5 F (36.4 C)-97.8 F (36.6 C)] 97.8 F (36.6 C) (05/25 1431) Pulse Rate:  [83-86] 83 (05/25 1431) Resp:  [17-18] 18 (05/25 1431) BP: (79-93)/(49-56) 93/54 mmHg (05/25 1431) SpO2:  [100 %] 100 % (05/25 1431) Weight:  [78.1 kg (172 lb 2.9 oz)] 78.1 kg (172 lb 2.9 oz) (05/25 0637) SpO2 Readings from Last 3 Encounters:  02/03/16 100%  01/18/16 98%  12/20/15 95%    Intake/Output Summary (Last 24 hours) at 02/03/16 1558 Last data filed at 02/03/16 1512  Gross per 24 hour  Intake   1298 ml  Output   1025 ml  Net    273 ml   Filed Weights   02/01/16 0339 02/02/16 1357 02/03/16 0637  Weight: 78.3 kg (172 lb 9.9 oz) 78.2 kg (172 lb 6.4 oz) 78.1 kg (172 lb 2.9 oz)   Body mass index is 27.8 kg/(m^2).   Physical Exam  Constitutional: She is well-developed, well-nourished, and in no distress. No distress.  Cardiovascular: Normal rate, regular rhythm and normal heart sounds.   Pulmonary/Chest: Effort normal and breath sounds normal. No respiratory distress. She has no wheezes.  Abdominal: Soft. Bowel sounds are normal. She exhibits no distension. There is no tenderness.  Musculoskeletal: She exhibits no edema or tenderness.  Neurological: She is alert. GCS score is 15.  Skin: Skin is warm and dry. She is not diaphoretic. No erythema.      Medications:  Scheduled Meds: . amiodarone  200 mg Oral BID  . antiseptic oral rinse  7 mL Mouth Rinse BID  . feeding supplement  1 Container Oral BID BM  . metolazone  2.5 mg Oral Once  . potassium chloride  20 mEq Oral BID  . torsemide  60 mg Oral BID   Continuous Infusions:  PRN Meds:  dextromethorphan 5 mL BID PRN  LORazepam 0.5-1 mg Q4H PRN   morphine injection 1-2 mg Q30 min PRN  sodium chloride flush 10-40 mL PRN    Labs:  CBC:  Recent Labs Lab 01/29/16 0502 01/30/16 0415 01/31/16 0315 02/01/16 0500 02/02/16 0450  WBC 5.9 6.3 6.2 4.6 5.0  HGB 9.9* 9.5* 10.2* 10.2* 10.4*  HCT 28.8* 27.9* 30.3* 29.2* 31.4*  MCV 90.0 90.9 90.7 88.8 92.6  PLT 200 183 171 160 153    CBG:  Recent Labs Lab 01/30/16 2147 01/31/16 0810 01/31/16 1121 01/31/16 1647 01/31/16 2114  GLUCAP 168* 152* 183* 148* 129*    Basic Metabolic Panel:  Recent Labs Lab 01/30/16 0415 01/31/16 0315 02/01/16 0500 02/02/16 0450 02/03/16 0510  GLUCOSE 273* 324* 121* 69 151*  NA 127* 125* 127* 132* 130*  K 4.0 3.7 3.2* 3.1* 4.0  CL 89* 84* 84* 83* 86*  CO2 25 26 34* 39* 36*  BUN 52* 49* 47* 45* 43*  CREATININE 2.71* 2.65* 2.69* 2.46* 2.54*  CALCIUM 8.3* 8.5* 8.8* 9.0 8.6*    GFR: Estimated Creatinine  Clearance: 17.7 mL/min (by C-G formula based on Cr of 2.54).  Liver Function Tests:  Recent Labs Lab 02/03/16 0510  AST 36  ALT 47  ALKPHOS 106  BILITOT 1.2  PROT 6.3*  ALBUMIN 2.0*   BNP (last 3 results)  Recent Labs  01/14/16 1436 01/15/16 0523 01/24/16 2314  BNP 1245.4* 1386.8* 2194.5*    Latest HbA1C/Lipids/Thyroid/Anemia: Lab Results  Component Value Date   HGBA1C 7.3* 11/23/2015   CHOL 90 12/16/2015   HDL 40* 12/16/2015   LDLCALC 37 12/16/2015   TRIG 67 12/16/2015   CHOLHDL 2.3 12/16/2015   TSH 2.600 12/18/2015    Sepsis Labs: Lab Results  Component Value Date   LATICACIDVEN 2.0 01/25/2016   LATICACIDVEN 8.2* 01/25/2016   LATICACIDVEN 12.0* 01/24/2016    Recent Results (from the past 240 hour(s))  Urine culture     Status: None   Collection Time: 01/24/16  9:18 PM  Result Value Ref Range Status   Specimen Description URINE, CATHETERIZED  Final   Special Requests NONE  Final   Culture NO GROWTH Performed at [facility]   Final   Report Status 01/26/2016 FINAL  Final  Culture, blood  (routine x 2)     Status: None   Collection Time: 01/25/16  2:48 AM  Result Value Ref Range Status   Specimen Description BLOOD LEFT HAND  Final   Special Requests IN PEDIATRIC BOTTLE [facility]  Final   Culture   Final    NO GROWTH 5 DAYS Performed at [facility]    Report Status 01/30/2016 FINAL  Final     Radiology Studies: No results found.  Time spent: 25 minutes  Janann August, MD [facility]  If 7PM-7AM, please contact night-coverage [email or web address] Password [facility] 02/03/2016, 3:58 PM

## 2016-02-03 NOTE — Progress Notes (Signed)
Daily Progress Note   Patient Name: Theresa Cortez       Date: 02/03/2016 DOB: 1930/07/15  Age: 82 y.o. MRN#: [id number] Attending Physician: Marja Kays, MD Primary Care Physician: Rogers Blocker, MD Admit Date: 01/24/2016  Reason for Consultation/Follow-up: Establishing goals of care  Subjective: Met this AM with patient, her son, and her daughter, Randell Patient.  We reviewed prior goals to live each day the best that she can and focus on spending time with her family.  We also discussed that she has already begun to reaccumulate fluid and that she is going to continue to have increasing symptom burden moving forward.  She reported yesterday that she would like to be out of the hospital, and we discussed hospice again today including residential hospice.  She also discussed this with Dr. Haroldine Laws last evening who also feels she would be well served by transitioning to [facility] for end-of-life care.    We discussed the intent of care moving forward would be to continue to focus on living each day as well as possible and aggressive symptom management. We talked about plan to continue with any medications that would help her feel better or be necessary for comfort while not pursuing any interventions not in line with this goal.  I shared with family that I believe that her prognosis will be limited to weeks at best, and that I believe that there is a high chance that she could revert to a-fib and acutely decompensate and die any point in time.  Length of Stay: 9  Current Medications: Scheduled Meds:  . amiodarone  200 mg Oral BID  . antiseptic oral rinse  7 mL Mouth Rinse BID  . feeding supplement  1 Container Oral BID BM  . hydrALAZINE  12.5 mg Oral Q8H  . [START ON 02/04/2016] potassium  chloride  20 mEq Oral Daily  . torsemide  40 mg Oral BID    Continuous Infusions:    PRN Meds: dextromethorphan, LORazepam, morphine injection, sodium chloride flush  Physical Exam         Weak, lying in bed but participates in conversation, She does appear to be much more fatigued than during our conversation yesterday, chronically ill Shallow breathing scattered crackles Regular Abdomen soft Has peripheral edema Opens eyes, converses appropriately, appears tired.  Vital Signs: BP 92/56 mmHg  Pulse 83  Temp(Src) 97.8 F (36.6 C) (Oral)  Resp 18  Ht _0  (1.676 m)  Wt 78.1 kg (172 lb 2.9 oz)  BMI 27.80 kg/m2  SpO2 100% SpO2: SpO2: 100 % O2 Device: O2 Device: Nasal Cannula O2 Flow Rate: O2 Flow Rate (L/min): 2 L/min  Intake/output summary:   Intake/Output Summary (Last 24 hours) at 02/03/16 1352 Last data filed at 02/03/16 1331  Gross per 24 hour  Intake    700 ml  Output   1525 ml  Net   -825 ml   LBM: Last BM Date: 01/31/16 Baseline Weight: Weight: 78.4 kg (172 lb 13.5 oz) Most recent weight: Weight: 78.1 kg (172 lb 2.9 oz)       Palliative Assessment/Data:    Flowsheet Rows        Most Recent Value   Intake Tab    Referral Department  Cardiology   Unit at Time of Referral  ICU   Palliative Care Primary Diagnosis  Cardiac   Date Notified  01/26/16   Palliative Care Type  Return patient Palliative Care   Reason for referral  Clarify Goals of Care   Date of Admission  01/24/16   Date first seen by Palliative Care  01/26/16   # of days Palliative referral response time  0 Day(s)   # of days IP prior to Palliative referral  2   Clinical Assessment    Palliative Performance Scale Score  30%   Pain Max last 24 hours  5   Pain Min Last 24 hours  4   Dyspnea Max Last 24 Hours  4   Dyspnea Min Last 24 hours  3   Psychosocial & Spiritual Assessment    Palliative Care Outcomes    Patient/Family meeting held?  Yes   Who was at the meeting?  patient     Palliative Care follow-up planned  Yes, Facility      Patient Active Problem List   Diagnosis Date Noted  . Encounter for palliative care   . Goals of care, counseling/discussion   . AKI (acute kidney injury) ([place])   . Acute on chronic systolic (congestive) heart failure ([place])   . Interstitial lung disease ([place])   . Cardiorenal disease 01/13/2016  . Nonischemic cardiomyopathy ([place]) 12/21/2015  . Coronary artery disease 12/21/2015  . Chronic renal insufficiency, stage IV (severe) 12/21/2015  . Fluid overload 12/20/2015  . Chronic combined systolic and diastolic congestive heart failure ([place]) 12/20/2015  . Adnexal cyst - left 12/16/2015  . Protein-calorie malnutrition, moderate ([place]) 12/16/2015  . HLD (hyperlipidemia) 12/15/2015  . Hypothyroidism 12/15/2015  . RA (rheumatoid arthritis) ([place]) 12/15/2015  . Hyponatremia 12/15/2015  . Chronic atrial fibrillation ([place]) 12/15/2015  . Hypotension 12/15/2015  . Arterial hypotension   . Elevated LFTs   . Positive D dimer   . Mitral regurgitation   . Tricuspid regurgitation   . Pulmonary hypertension ([place])   . DM type 2 (diabetes mellitus, type 2) ([place]) 10/11/2012    Palliative Care Assessment & Plan   Patient Profile:    Assessment:  End-stage HF    severe valvular disease    severe functional decline over past 3-6 months.  ?recently diagnosed with ILD  Recommendations/Plan: She is currently doing well from symptom management standpoint but is much more fatigued today.  I am concerned that this is beginning of worsening of symptoms.  Plan for medications as needed for comfort if symptoms arise.  Pain/SOB: Morphine as needed. Continue to monitor and plan to escalate as needed to ensure comfort.  Anxiety: Currently not an issue. Will add on ativan for use as needed.  Discussed with family transitioning to [facility] for end-of-life care. Family is in agreement with this plan and placed consult to social work to begin  referral process to [employer].   Code Status:    Code Status Orders        Start     Ordered   01/27/16 1337  Do not attempt resuscitation (DNR)   Continuous    Question Answer Comment  In the event of cardiac or respiratory ARREST Do not call a "code blue"   In the event of cardiac or respiratory ARREST Do not perform Intubation, CPR, defibrillation or ACLS   In the event of cardiac or respiratory ARREST Use medication by any route, position, wound care, and other measures to relive pain and suffering. May use oxygen, suction and manual treatment of airway obstruction as needed for comfort.      01/27/16 1336    Code Status History    Date Active Date Inactive Code Status Order ID Comments User Context   01/25/2016  2:26 AM 01/27/2016  1:36 PM Full Code [id number]  Raylene Miyamoto, MD ED   01/13/2016  9:56 PM 01/18/2016 10:23 PM Full Code [id number]  Roney Jaffe, MD Inpatient   12/15/2015  8:04 PM 12/20/2015  5:34 PM Full Code [id number]  Ivor Costa, MD ED   11/22/2015 11:46 PM 11/30/2015  2:13 PM Full Code [id number]  Norval Morton, MD ED    Advance Directive Documentation        Most Recent Value   Type of Advance Directive  Out of facility DNR (pink MOST or yellow form)   Pre-existing out of facility DNR order (yellow form or pink MOST form)  Pink MOST form placed in chart (order not valid for inpatient use)   "MOST" Form in Place?         Prognosis:   I suspect that she will continue to decompensate and likely die in the next couple of weeks.  She is off inotrope therapy with no plan to restart.   She is beginning to reaccumulate fluid.  Currently in sinus rhythm and she is alert and interactive.  She is, however, at high risk for acute decompensation and death at any point, especially if the time comes that she reverts back to A. Fib.  I also discussed with heart failure team who is in agreement that likely prognosis would be weeks at most.  Discharge Planning:  Hospice  facility.  She is at high risk for acute decompensation and death, but currently seems stable enough to look into option for discharge from the hospital with hospice support.  She has high risk of acute decompensation and development of quickly progressing symptom burden that will require close monitoring and adjustment of medications.  Based upon limited prognosis and high risk to develop acute worsening of her symptoms, I feel she would best served by residential hospice if this can be arranged.  Care plan was discussed with  Patient, daughter, son, Dr. Myna Bright via text page.  Thank you for allowing the Palliative Medicine Team to assist in the care of this patient.   Time In: 1100 Time Out: 1140 Total Time 40 Prolonged Time Billed  no       Greater than 50%  of this time was spent counseling and coordinating  care related to the above assessment and plan.  Micheline Rough, MD [phone]  Please contact Palliative Medicine Team phone at [phone] for questions and concerns.

## 2016-02-03 NOTE — Progress Notes (Signed)
Pharmacist Heart Failure Core Measure Documentation  Assessment: Theresa Cortez has an EF documented as 25-30% on 01/25/16 by ECHO.  Rationale: Heart failure patients with left ventricular systolic dysfunction (LVSD) and an EF < 40% should be prescribed an angiotensin converting enzyme inhibitor (ACEI) or angiotensin receptor blocker (ARB) at discharge unless a contraindication is documented in the medical record.  This patient is not currently on an ACEI or ARB for HF.  This note is being placed in the record in order to provide documentation that a contraindication to the use of these agents is present for this encounter.  ACE Inhibitor or Angiotensin Receptor Blocker is contraindicated (specify all that apply)  []   ACEI allergy AND ARB allergy []   Angioedema []   Moderate or severe aortic stenosis []   Hyperkalemia [x]   Hypotension []   Renal artery stenosis [x]   Worsening renal function, preexisting renal disease or dysfunction    [name] D. , PharmD, BCPS Pager:  [phone] 02/03/2016, 1:37 PM

## 2016-02-03 NOTE — Progress Notes (Signed)
[id number] Liaison Note:  Received request from Theresa Cortez, CSW of family interest in [place]. Appreciate report from Dr. Neale Burly. Spoke with the patient's son Theresa Cortez to confirm interest and to explain services. Son is agreeable to [facility] pending bed availability. No beds available today.  Will follow up with CSW tomorrow regarding bed availability.   Thank you,  Hessie Knows RN, BSN [facility]  [phone]

## 2016-02-04 DIAGNOSIS — J9601 Acute respiratory failure with hypoxia: Secondary | ICD-10-CM

## 2016-02-04 LAB — GLUCOSE, CAPILLARY: GLUCOSE-CAPILLARY: 137 mg/dL — AB (ref 65–99)

## 2016-02-04 MED ORDER — TORSEMIDE 20 MG PO TABS: 60.0000 mg | ORAL_TABLET | Freq: Two times a day (BID) | ORAL | Status: DC

## 2016-02-04 MED ORDER — BOOST / RESOURCE BREEZE PO LIQD: 1.0000 | Freq: Two times a day (BID) | ORAL | Status: DC

## 2016-02-04 MED ORDER — FUROSEMIDE 10 MG/ML IJ SOLN
80.0000 mg | Freq: Once | INTRAMUSCULAR | Status: AC
  Administered 2016-02-04: 80 mg via INTRAVENOUS
  Filled 2016-02-04: qty 8

## 2016-02-04 MED ORDER — LORAZEPAM 2 MG/ML PO CONC: 1.0000 mg | Freq: Four times a day (QID) | ORAL | Status: DC | PRN

## 2016-02-04 MED ORDER — MORPHINE SULFATE (CONCENTRATE) 10 MG/0.5ML PO SOLN
5.0000 mg | ORAL | Status: DC | PRN
Start: 2016-02-04 — End: 2016-03-02

## 2016-02-04 MED ORDER — AMIODARONE HCL 200 MG PO TABS: 200.0000 mg | ORAL_TABLET | Freq: Two times a day (BID) | ORAL | Status: DC

## 2016-02-04 MED ORDER — DEXTROMETHORPHAN POLISTIREX ER 30 MG/5ML PO SUER: 5.0000 mL | Freq: Two times a day (BID) | ORAL | Status: DC | PRN

## 2016-02-04 NOTE — Discharge Summary (Signed)
Physician Discharge Summary  STOREY STANGELAND MRN: [id number] DOB/AGE: 80-Jun-1931 80 y.o.  PCP: Rogers Blocker, MD   Admit date: 01/24/2016 Discharge date: 02/04/2016  Discharge Diagnoses:     Principal Problem:   Acute on chronic systolic (congestive) heart failure (HCC) Active Problems:   DM type 2 (diabetes mellitus, type 2) (HCC)   HLD (hyperlipidemia)   Hypothyroidism   RA (rheumatoid arthritis) ([place])   Hyponatremia   Hypotension   Nonischemic cardiomyopathy ([place])   Coronary artery disease   Chronic renal insufficiency, stage IV (severe)   Cardiorenal disease   Encounter for palliative care   Goals of care, counseling/discussion   AKI (acute kidney injury) ([place])   PAF (paroxysmal atrial fibrillation) ([place])    Follow-up recommendations Patient being discharged to [facility]     Current Discharge Medication List    START taking these medications   Details  amiodarone (PACERONE) 200 MG tablet Take 1 tablet (200 mg total) by mouth 2 (two) times daily. Qty: 60 tablet, Refills: 0    dextromethorphan (DELSYM) 30 MG/5ML liquid Take 5 mLs (30 mg total) by mouth 2 (two) times daily as needed for cough. Qty: 89 mL, Refills: 0    !! feeding supplement (BOOST / RESOURCE BREEZE) LIQD Take 1 Container by mouth 2 (two) times daily between meals. Qty: 1 Container, Refills: 0    LORazepam (ATIVAN) 2 MG/ML concentrated solution Take 0.5 mLs (1 mg total) by mouth every 6 (six) hours as needed for anxiety. Qty: 30 mL, Refills: 0    Morphine Sulfate (MORPHINE CONCENTRATE) 10 MG/0.5ML SOLN concentrated solution Take 0.25 mLs (5 mg total) by mouth every 2 (two) hours as needed for severe pain. Qty: 180 mL, Refills: 0    torsemide (DEMADEX) 20 MG tablet Take 3 tablets (60 mg total) by mouth 2 (two) times daily. Qty: 60 tablet, Refills: 0     !! - Potential duplicate medications found. Please discuss with provider.    CONTINUE these medications which have NOT CHANGED   Details   acetaminophen (TYLENOL) 325 MG tablet Take 2 tablets (650 mg total) by mouth every 12 (twelve) hours as needed for moderate pain. Qty: 10 tablet, Refills: 0    albuterol (PROVENTIL) (2.5 MG/3ML) 0.083% nebulizer solution Take 2.5 mg by nebulization every 4 (four) hours as needed for wheezing or shortness of breath.    docusate sodium (COLACE) 100 MG capsule Take 2 capsules (200 mg total) by mouth 2 (two) times daily. Qty: 10 capsule, Refills: 0    latanoprost (XALATAN) 0.005 % ophthalmic solution Place 1 drop into both eyes at bedtime.    levothyroxine (SYNTHROID, LEVOTHROID) 88 MCG tablet Take 88 mcg by mouth daily before breakfast.    loratadine (CLARITIN) 10 MG tablet Take 10 mg by mouth daily.    !! Nutritional Supplements (NUTRITIONAL DRINK PO) Take 120 mLs by mouth 3 (three) times daily.    polyethylene glycol (MIRALAX / GLYCOLAX) packet Take 17 g by mouth daily. Qty: 14 each, Refills: 0     !! - Potential duplicate medications found. Please discuss with provider.    STOP taking these medications     apixaban (ELIQUIS) 2.5 MG TABS tablet      benzonatate (TESSALON) 200 MG capsule      Digoxin 62.5 MCG TABS      doxycycline (VIBRA-TABS) 100 MG tablet      famotidine (PEPCID) 20 MG tablet      folic acid (FOLVITE) 1 MG tablet  furosemide (LASIX) 40 MG tablet      HYDROcodone-homatropine (HYCODAN) 5-1.5 MG/5ML syrup      hydroxychloroquine (PLAQUENIL) 200 MG tablet      insulin aspart (NOVOLOG) 100 UNIT/ML injection          Discharge Condition:      Discharge Instructions    Diet - low sodium heart healthy    Complete by:  As directed      Increase activity slowly    Complete by:  As directed             Allergies  Allergen Reactions  . Penicillins Swelling    Has patient had a PCN reaction causing immediate rash, facial/tongue/throat swelling, SOB or lightheadedness with hypotension: unknown Has patient had a PCN reaction causing severe rash  involving mucus membranes or skin necrosis: unknown Has patient had a PCN reaction that required hospitalization: unknown Has patient had a PCN reaction occurring within the last 10 years: unknown If all of the above answers are "NO", then may proceed with Cephalosporin use.       Disposition: 03-Skilled Nursing Facility   Consults: Cardiology Palliative care     Significant Diagnostic Studies:  Ct Abdomen Pelvis Wo Contrast  01/24/2016  CLINICAL DATA:  LEFT lower quadrant pain and vaginal spotting, normal bowel movements, vomiting today, hypertension, type II diabetes mellitus, stroke, atrial fibrillation EXAM: CT ABDOMEN AND PELVIS WITHOUT CONTRAST TECHNIQUE: Multidetector CT imaging of the abdomen and pelvis was performed following the standard protocol without IV contrast. Sagittal and coronal MPR images reconstructed from axial data set. Patient drank a small amount of dilute oral contrast but vomited. COMPARISON:  12/16/2015 FINDINGS: Bibasilar bronchiectasis and scattered atelectasis/scarring. Within limits of a nonenhanced exam no focal abnormalities of the liver, gallbladder, spleen, pancreas, or kidneys. BILATERAL thickened adrenal glands without discrete mass. Small LEFT ovarian cyst 2.7 x 2.2 cm image 56, previously 2.6 cm greatest diameter. Uterus, adnexa, bladder, and ureters otherwise unremarkable. Small amount of nonspecific free pelvic fluid. Appendix surgically absent by history. Stomach and bowel loops grossly unremarkable. No mass, adenopathy, or free air. Scattered atherosclerotic calcifications without aneurysm. Bones normal appearance. IMPRESSION: No definite acute intra-abdominal or intrapelvic abnormalities. Small LEFT ovarian cyst. Electronically Signed   By: Lavonia Dana M.D.   On: 01/24/2016 23:17   Dg Chest 2 View  01/13/2016  CLINICAL DATA:  Shortness of breath EXAM: CHEST  2 VIEW COMPARISON:  12/17/2015 chest radiograph. FINDINGS: Stable cardiomediastinal  silhouette with mild cardiomegaly. No pneumothorax. No pleural effusion. New mild hazy opacities in the bilateral upper lobes. IMPRESSION: Stable mild cardiomegaly. New mild hazy opacities in the bilateral upper lobes, which are nonspecific and could represent pneumonia or mild pulmonary edema. Recommend follow-up PA and lateral post treatment chest radiographs in 4-6 weeks. Electronically Signed   By: Ilona Sorrel M.D.   On: 01/13/2016 17:46   Ct Chest Wo Contrast  01/17/2016  CLINICAL DATA:  Worsening cough for several days. Recent diagnosis of pneumonia. Inpatient. EXAM: CT CHEST WITHOUT CONTRAST TECHNIQUE: Multidetector CT imaging of the chest was performed following the standard protocol without IV contrast. COMPARISON:  No prior chest CT. 01/13/2016 chest radiograph. 12/16/2015 CT abdomen/pelvis. FINDINGS: Mediastinum/Nodes: Mild cardiomegaly. No significant pericardial fluid/thickening. Left anterior descending coronary atherosclerosis. Atherosclerotic nonaneurysmal thoracic aorta. Normal caliber pulmonary arteries. Normal visualized thyroid. Normal esophagus. No pathologically enlarged axillary, mediastinal or gross hilar lymph nodes, noting limited sensitivity for the detection of hilar adenopathy on this noncontrast study. Lungs/Pleura: No pneumothorax. Small  right and trace left layering pleural effusions. There are prominent patchy regions of subpleural reticulation, subpleural lines and traction bronchiectasis throughout both lungs, basilar predominant. There are areas of possible early honeycombing in the right lower lobe (series 5/images 55, 66 and 79). These findings were present at the lung bases on the 12/16/2015 CT abdomen study, with possible progression of the suspected peripheral focus of early honeycombing since 12/16/2015. Patchy mild ground-glass opacity at the periphery of the apical upper lobes. Otherwise no acute consolidative airspace disease, significant solid pulmonary nodules or  lung masses. Upper abdomen: Unremarkable. Musculoskeletal: No aggressive appearing focal osseous lesions. Mild-to-moderate degenerative changes in the thoracic spine. IMPRESSION: 1. Spectrum of findings suggestive of interstitial lung disease, including prominent patchy basilar-predominant regions of subpleural reticulation, subpleural lines, traction bronchiectasis and possible early honeycombing. Findings were present at the lung bases on 12/16/2015 CT abdomen study, with possible progression of these findings since 12/16/2015. 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 Pulmonology consultation advised. A high-resolution chest CT study in 6 months is recommended to assess for temporal pattern stability. 2. Cardiomegaly and small right and trace left layering pleural effusions. Mild patchy ground-glass opacity in the apical upper lobes could indicate a component of mild superimposed pulmonary edema. 3. One vessel coronary atherosclerosis. Electronically Signed   By: Ilona Sorrel M.D.   On: 01/17/2016 13:42   Dg Esophagus  01/18/2016  CLINICAL DATA:  Dysphagia.  Cough. EXAM: ESOPHOGRAM/BARIUM SWALLOW TECHNIQUE: Single contrast examination was performed using  thin barium. FLUOROSCOPY TIME:  Radiation Exposure Index (as provided by the fluoroscopic device): 35.5 mGy If the device does not provide the exposure index: Fluoroscopy Time:  1 minutes and 50 seconds. Number of Acquired Images:  5 COMPARISON:  CT scan 01/17/2016 FINDINGS: Nonspecific esophageal dysmotility. Disruption of the primary peristaltic wave and occasional tertiary contractions. Left atrial enlargement with impression on the esophagus. No intrinsic lesions are identified. Pseudo strictured narrowing near the GE junction. This is likely a reflux stricture. The 13 mm barium pill would not pass through this area. IMPRESSION: Nonspecific  esophageal dysmotility. No esophageal mass. Smooth distal esophageal stricture. A 13 mm barium pill would not pass through this area. No GE reflux demonstrated. Electronically Signed   By: Marijo Sanes M.D.   On: 01/18/2016 14:41   Dg Chest Port 1 View  01/26/2016  CLINICAL DATA:  Respiratory failure. EXAM: PORTABLE CHEST 1 VIEW COMPARISON:  01/25/2016. FINDINGS: Cardiomegaly with normal pulmonary vascularity. Mild bibasilar infiltrates and or edema. Small bilateral pleural effusions . IMPRESSION: 1. Cardiomegaly. 2. Mild bibasilar infiltrates and/or pulmonary edema. Small bilateral pleural effusions. Congestive heart failure cannot be excluded. Electronically Signed   By: Strang   On: 01/26/2016 07:28   Dg Chest Port 1 View  01/25/2016  CLINICAL DATA:  Confirm line placement. EXAM: PORTABLE CHEST 1 VIEW COMPARISON:  01/24/2016 FINDINGS: Right arm PICC line has been placed. Tip in the SVC region. Heart size is mildly enlarged. Again noted are a few scattered parenchymal densities in both lungs which are similar to the prior examination. Negative for a pneumothorax. IMPRESSION: PICC line tip in the SVC region. Patchy parenchymal lung densities are unchanged. Electronically Signed   By: Markus Daft M.D.   On: 01/25/2016 14:37   Dg Chest Portable 1 View  01/24/2016  CLINICAL DATA:  Hypoxia, patient states she has been weak for past 48 hours EXAM: PORTABLE CHEST  1 VIEW COMPARISON:  CT 01/17/2016, radiograph 01/13/2016 FINDINGS: Stable enlarged cardiac silhouette. There is patchy nodular densities in the lungs unchanged from prior. No pulmonary edema. No focal consolidation or pneumothorax. IMPRESSION: No change in patchy pulmonary parenchymal opacities compared CT of 01/17/2016 Electronically Signed   By: Suzy Bouchard M.D.   On: 01/24/2016 21:56       Filed Weights   02/02/16 1357 02/03/16 0637 02/04/16 0643  Weight: 78.2 kg (172 lb 6.4 oz) 78.1 kg (172 lb 2.9 oz) 78 kg (171 lb 15.3 oz)      Microbiology: No results found for this or any previous visit (from the past 240 hour(s)).     Blood Culture    Component Value Date/Time   SDES BLOOD LEFT HAND 01/25/2016 0248   SPECREQUEST IN PEDIATRIC BOTTLE 2CC 01/25/2016 0248   CULT  01/25/2016 0248    NO GROWTH 5 DAYS Performed at [facility] 01/30/2016 FINAL 01/25/2016 0248      Labs: Results for orders placed or performed during the hospital encounter of 01/24/16 (from the past 48 hour(s))  Comprehensive metabolic panel     Status: Abnormal   Collection Time: 02/03/16  5:10 AM  Result Value Ref Range   Sodium 130 (L) 135 - 145 mmol/L   Potassium 4.0 3.5 - 5.1 mmol/L    Comment: DELTA CHECK NOTED   Chloride 86 (L) 101 - 111 mmol/L   CO2 36 (H) 22 - 32 mmol/L   Glucose, Bld 151 (H) 65 - 99 mg/dL   BUN 43 (H) 6 - 20 mg/dL   Creatinine, Ser 2.54 (H) 0.44 - 1.00 mg/dL   Calcium 8.6 (L) 8.9 - 10.3 mg/dL   Total Protein 6.3 (L) 6.5 - 8.1 g/dL   Albumin 2.0 (L) 3.5 - 5.0 g/dL   AST 36 15 - 41 U/L   ALT 47 14 - 54 U/L   Alkaline Phosphatase 106 38 - 126 U/L   Total Bilirubin 1.2 0.3 - 1.2 mg/dL   GFR calc non Af Amer 16 (L) >60 mL/min   GFR calc Af Amer 19 (L) >60 mL/min    Comment: (NOTE) The eGFR has been calculated using the CKD EPI equation. This calculation has not been validated in all clinical situations. eGFR's persistently <60 mL/min signify possible Chronic Kidney Disease.    Anion gap 8 5 - 15  Glucose, capillary     Status: Abnormal   Collection Time: 02/04/16  6:57 AM  Result Value Ref Range   Glucose-Capillary 137 (H) 65 - 99 mg/dL     Lipid Panel     Component Value Date/Time   CHOL 90 12/16/2015 0113   TRIG 67 12/16/2015 0113   HDL 40* 12/16/2015 0113   CHOLHDL 2.3 12/16/2015 0113   VLDL 13 12/16/2015 0113   LDLCALC 37 12/16/2015 0113     Lab Results  Component Value Date   HGBA1C 7.3* 11/23/2015   HGBA1C 6.2* 06/07/2012     Lab Results  Component  Value Date   LDLCALC 37 12/16/2015   CREATININE 2.54* 02/03/2016     HPI : 82 year old with a history of HTN, HLD, Hypothyroidism, combined systolic and diastolic CHF, moderate to severe MR, PAF (on Eliquis) Stage 3 CKD, ILD, nd RA (on MTX).  Recently diagnosed with systolic heart failure. In March of this year had RHC/LHC mild obstructed CAD. NICM. She was admitted earlier this month with volume overload. Diuresed with IV lasix  and transitioned to lasix 40 mg daily. D/C on digoxin. Was in Sinus Tach. No other HF meds due to hypotension. Discharged to SNF.   Earlier today she presented to [facility] ED with increased dyspnea. Was in A fib RVR. On admit Lactic Acid 8.2, K 5.7 creatinine 1.69, Mag 1.9, Albumin 2.6, AST 126, and ALT 258. O2 sat in the ED was 73%. Placed on NRB. On admit CXR showed pulmonary edema. CT abdomen ok. Received IV lasix. Cardiology was consulted. Patient was on inotropes. Cardiology discussing comfort care with patient and family on a regular basis. Finally off milrinone. After multiple discussions with the patient and family, she is now being transitioned to comfort care. Palliative care also on board. Hopefully bed at [facility] tomorrow. Current cardiac meds are more for palliation.    HOSPITAL COURSE:   Acute on chronic systolic (congestive) heart failure  Palliative care and cardiology consulted, multiple discussions with family regarding her long term prognosis, family has decided on Hospice care at [facility].   - Cardiology has changed her lasix to demadex - increased from 40 bid to 60 bid, minimal output and no change in weight. Also giving x1 metolazone. restarted her PO amiodarone  She is now off all inotropes. BP remains soft, but stable. Unable to tolerate hydralazine, stopped 02/03/16.  Continue torsemide 60 bid. Continue amiodarone. Both meds for palliative purposes (fluid control and rhythm control for afib)   She is very hypotensive. Also her renal failure  contributes to fluid overload as well. Very difficult balance and it is appropriate to pursue comfort care measures only.  She is willing to go to [facility]. Likely the right option for her. Suspect she will continue to deteriorate. She is not a candidate for advanced therapies with her advanced age and significant co-morbidities.   - HR is better today, hopefully holds well on PO amio; anticoagulation has been stopped given pursuing comfort care measures.   - TSH was good last month, cont levothyroxine - sugars are fair, we have discontinued Accu-Cheks and insulin  She remains very tenuous. Cardiology had a Long talk about role of [place] vs SNF and she is willing to go as long as we continue her HF meds. We are expecting her HF to get worse over next few weeks despite meds and [facility] is best place to handle that.   Discharge Exam:    Blood pressure 90/51, pulse 79, temperature 97.5 F (36.4 C), temperature source Oral, resp. rate 17, height _0  (1.676 m), weight 78 kg (171 lb 15.3 oz), SpO2 100 %.  General: Chronically ill appearing. Dyspneic at rest HEENT: normal Neck: supple. JVP to ear . Carotids 2+ bilat; no bruits. No lymphadenopathy or thryomegaly appreciated. Cor: PMI nondisplaced. Irregular rate & rhythm. No rubs, +S3 2/6 MR  Lungs: RLL LLL Crackles on 80% NRB Abdomen: soft, nontender, nondistended. No hepatosplenomegaly. No bruits or masses. Good bowel sounds. Extremities: no cyanosis, clubbing, rash, R and LLE edema 2 + extremities cool Neuro: alert & orientedx3, cranial nerves grossly intact. moves all 4 extremities w/o difficulty. Affect pleasant      Signed: Reyne Dumas 02/04/2016, 12:43 PM        Time spent >45 mins

## 2016-02-04 NOTE — Progress Notes (Signed)
Transport has arrived to take patient to [facility].

## 2016-02-04 NOTE — Progress Notes (Signed)
Report called to [facility] at [facility] at [phone]. Agreed and verbalized understanding. No further questions at this moment.  Theresa Cortez n 02/04/2016 1:25 PM

## 2016-02-04 NOTE — Clinical Social Work Note (Addendum)
CSW received phone call from [facility] who said they have a bed available for her today, family in agreement to plan to discharge to [facility] residential hospice.  [facility] can accept patient once discharge summary and order are complete.  DNR on patient chart awaiting signature by physician.  Patient to be d/c'ed today to [facility].  Patient and family agreeable to plans will transport via ems RN to call report.  Ervin Knack. Theresa Cortez, MSW, Theresia Majors [phone] 02/04/2016 9:48 AM

## 2016-02-04 NOTE — Progress Notes (Signed)
[facility]   Received request from [place] for family interest in [facility]. Chart reviewed.  [facility] available today. Met with patient and Charlene to complete paperwork for transfer.  ? RN please call report to [phone]. ? Please arrange transport for as early in day as possible.  ? Thank you, Freddi Starr RN, [facility] Liaison [phone]

## 2016-02-04 NOTE — Progress Notes (Signed)
Advanced Heart Failure Rounding Note   Subjective:    She is off all inotropes.  Remains fatigued, but overall feeling OK.    Weight relatively stable (down 3 oz) despite increase of torsemide and dose of metolazone yesterday.  Creatinine remains elevated, but stable.   Objective:   Weight Range:  Vital Signs:   Temp:  [97.5 F (36.4 C)-97.8 F (36.6 C)] 97.5 F (36.4 C) (05/26 0643) Pulse Rate:  [79-83] 79 (05/26 0643) Resp:  [17-18] 17 (05/26 0643) BP: (86-93)/(51-54) 90/51 mmHg (05/26 0643) SpO2:  [100 %] 100 % (05/26 0643) Weight:  [171 lb 15.3 oz (78 kg)] 171 lb 15.3 oz (78 kg) (05/26 0643) Last BM Date: 02/01/16  Weight change: Filed Weights   02/02/16 1357 02/03/16 0637 02/04/16 0643  Weight: 172 lb 6.4 oz (78.2 kg) 172 lb 2.9 oz (78.1 kg) 171 lb 15.3 oz (78 kg)    Intake/Output:   Intake/Output Summary (Last 24 hours) at 02/04/16 0948 Last data filed at 02/04/16 0650  Gross per 24 hour  Intake   1518 ml  Output   1400 ml  Net    118 ml     Physical Exam: General: Chronically ill . NAD. On 2 liters  HEENT: normal Neck: supple. JVP 8-9  Carotids 2+ bilat; no bruits. No thyromegaly or lymphadenopathy noted.  Cor: PMI nondisplaced. RRR. No rubs or 2/6 MR/TR  + s3 Lungs: Slight diminished basilar sounds with scant crackles. Scattered rhonchi that clear with cough Abdomen: soft, non-tender, non-distended, no HSM. No bruits or masses. +BS  Extremities: no cyanosis, clubbing, rash, R and LLE trace edema into thighs with SCDs on RLE and LLE  Neuro: alert & orientedx3, cranial nerves grossly intact. moves all 4 extremities w/o difficulty. Affect pleasant   Labs: Basic Metabolic Panel:  Recent Labs Lab 01/30/16 0415 01/31/16 0315 02/01/16 0500 02/02/16 0450 02/03/16 0510  NA 127* 125* 127* 132* 130*  K 4.0 3.7 3.2* 3.1* 4.0  CL 89* 84* 84* 83* 86*  CO2 25 26 34* 39* 36*  GLUCOSE 273* 324* 121* 69 151*  BUN 52* 49* 47* 45* 43*  CREATININE 2.71*  2.65* 2.69* 2.46* 2.54*  CALCIUM 8.3* 8.5* 8.8* 9.0 8.6*    Liver Function Tests:  Recent Labs Lab 02/03/16 0510  AST 36  ALT 47  ALKPHOS 106  BILITOT 1.2  PROT 6.3*  ALBUMIN 2.0*   No results for input(s): LIPASE, AMYLASE in the last 168 hours. No results for input(s): AMMONIA in the last 168 hours.  CBC:  Recent Labs Lab 01/29/16 0502 01/30/16 0415 01/31/16 0315 02/01/16 0500 02/02/16 0450  WBC 5.9 6.3 6.2 4.6 5.0  HGB 9.9* 9.5* 10.2* 10.2* 10.4*  HCT 28.8* 27.9* 30.3* 29.2* 31.4*  MCV 90.0 90.9 90.7 88.8 92.6  PLT 200 183 171 160 153    Cardiac Enzymes: No results for input(s): CKTOTAL, CKMB, CKMBINDEX, TROPONINI in the last 168 hours.  BNP: BNP (last 3 results)  Recent Labs  01/14/16 1436 01/15/16 0523 01/24/16 2314  BNP 1245.4* 1386.8* 2194.5*    ProBNP (last 3 results) No results for input(s): PROBNP in the last 8760 hours.    Other results:  Imaging: No results found.   Medications:     Scheduled Medications: . amiodarone  200 mg Oral BID  . antiseptic oral rinse  7 mL Mouth Rinse BID  . feeding supplement  1 Container Oral BID BM  . potassium chloride  20 mEq Oral BID  .  torsemide  60 mg Oral BID    Infusions:    PRN Medications: dextromethorphan, LORazepam, morphine injection, sodium chloride flush   Assessment:  1. Cardiogenic Shock 2. A/C Systolic Heart Failure  --EF 25-30% with NICM and severe MR 3. Acute A fib RVR-  4. Lactic Acidosis 5. CKD Stage IV 6. Elevated AST/ALT  7. Hypothyroidism 8. Hyperkalemia/hyponatremia 9. Hypoalbuminemia- Albumin 2.6  10. Severe MR/TR   Plan/Discussion:    She is now off all inotropes. BP remains soft, but stable. Unable to tolerate hydralazine, stopped 02/03/16.  Suspect she may have had brief AF am of 02/03/16. She seems to be in NSR by exam, but no Tele with palliative status.   Continue torsemide 60 bid. Continue amiodarone. Both meds for palliative purposes (fluid  control and rhythm control for afib)  She is willing to go to [facility]. Likely the right option for her.  Suspect she will continue to deteriorate. She is not a candidate for advanced therapies with her advanced age and significant co-morbidities.    Graciella Freer, PA-C 9:48 AM   Advanced Heart Failure Team Pager [phone] (M-F; 7a - 4p)  Please contact [facility] for night-coverage after hours (4p -7a ) and weekends on [email or web address]  Patient seen and examined with Otilio Saber, PA-C. We discussed all aspects of the encounter. I agree with the assessment and plan as stated above.   She remains very tenuous. Long talk about role of [facility] vs SNF and she is willing to go as long as we continue her HF meds. I was clear that we are expecting her HF to get worse over next few weeks despite meds and [facility] is best place to handle that. Remains volume overloaded. Will give one dose IV lasix today prior to d/c.   Leomia Blake,MD 12:04 PM

## 2016-02-21 ENCOUNTER — Inpatient Hospital Stay: Payer: Medicare Other | Admitting: Pulmonary Disease

## 2016-03-02 ENCOUNTER — Encounter: Payer: Self-pay | Admitting: Nurse Practitioner

## 2016-03-02 ENCOUNTER — Ambulatory Visit (INDEPENDENT_AMBULATORY_CARE_PROVIDER_SITE_OTHER): Payer: Medicare Other | Admitting: Nurse Practitioner

## 2016-03-02 VITALS — BP 110/52 | HR 60 | Ht 66.0 in | Wt 164.4 lb

## 2016-03-02 DIAGNOSIS — I480 Paroxysmal atrial fibrillation: Secondary | ICD-10-CM | POA: Diagnosis not present

## 2016-03-02 DIAGNOSIS — I119 Hypertensive heart disease without heart failure: Secondary | ICD-10-CM | POA: Insufficient documentation

## 2016-03-02 DIAGNOSIS — N183 Chronic kidney disease, stage 3 unspecified: Secondary | ICD-10-CM

## 2016-03-02 DIAGNOSIS — I5022 Chronic systolic (congestive) heart failure: Secondary | ICD-10-CM

## 2016-03-02 DIAGNOSIS — I429 Cardiomyopathy, unspecified: Secondary | ICD-10-CM | POA: Diagnosis not present

## 2016-03-02 DIAGNOSIS — N184 Chronic kidney disease, stage 4 (severe): Secondary | ICD-10-CM | POA: Insufficient documentation

## 2016-03-02 DIAGNOSIS — I2510 Atherosclerotic heart disease of native coronary artery without angina pectoris: Secondary | ICD-10-CM

## 2016-03-02 DIAGNOSIS — I428 Other cardiomyopathies: Secondary | ICD-10-CM

## 2016-03-02 NOTE — Progress Notes (Signed)
Office Visit    Patient Name: Theresa Cortez Date of Encounter: 03/02/2016  Primary Care Provider:  Gwenyth Bender, MD Primary Cardiologist:  Judie Petit. Croitoru, MD   Chief Complaint    82 year old female with history of hypertension, hyper lipidemia, diabetes, mitral regurgitation, paroxysmal atrial fibrillation and systolic and diastolic heart failure, who presents for follow-up after hospitalization related to heart failure.  Past Medical History    Past Medical History  Diagnosis Date  . Hypertensive heart disease   . Wound infection (HCC)     Right knee   . Hypothyroidism   . History of diverticulosis   . Hyperlipidemia   . Type II diabetes mellitus (HCC)   . History of blood transfusion X 2    w/knee replacement  . Stroke [facility]) 2000    denies residual on 11/23/2015  . Rheumatoid arthritis(714.0)   . DJD (degenerative joint disease)   . Arthritis     "hands, arms" (11/23/2015)  . CKD (chronic kidney disease), stage III   . Chronic combined systolic and diastolic heart failure (HCC)     a. 01/2016 Echo: EF 25-30%, mild LVH, antsept, infsept, inf HK, mod to sev MR, mod dil LA, mild TR, PASP .  . Paroxysmal atrial fibrillation (HCC)   . Interstitial lung disease (HCC)   . NICM (nonischemic cardiomyopathy) (HCC)     a. 11/2015 Cath: mild nonobs dzs; b. 01/2016 Echo: EF 25-30%, mild LVH, antsept, infsept, inf HK, mod to sev MR, mod dil LA, mild TR, PASP .  . Mitral regurgitation     a. 01/2016 Mod to sev by echo.   Past Surgical History  Procedure Laterality Date  . Knee arthroscopy Right   . Total knee arthroplasty Right 08/07/2011; 2014  . Partial colectomy      descending colon by Dr. Orson Slick  . Appendectomy    . Medial partial knee replacement Right   . Incision and drainage mouth Right 2014    "knee; took replacement out; put  spacers in"  . Tubal ligation    . Cataract extraction w/ intraocular lens implant Right   . Cardiac catheterization N/A 11/25/2015   Procedure: Right/Left Heart Cath and Coronary Angiography;  Surgeon: Iran Ouch, MD;  Location: [facility] INVASIVE CV LAB;  Service: Cardiovascular;  Laterality: N/A;    Allergies  Allergies  Allergen Reactions  . Penicillins Swelling        History of Present Illness    82 year old female with the above complex past medical history. She is an EF of 25-30% with mild nonobstructive CAD by catheterization earlier this year. She also has a history of hypertension, hyperlipidemia, diabetes, hypothyroidism, and paroxysmal atrial fibrillation on amiodarone therapy. She was previously on eliquis but this was subsequently discontinued. She was last seen in clinic in April at which point she was living in a nursing home after hospital admissions for heart failure and subsequently pneumonia. She was felt to be doing reasonably well at that point. Unfortunate, she was readmitted to [facility] in late May with worsening dyspnea and A. fib with RVR. She had pulmonary edema on chest x-ray. She was placed on amiodarone and diuresis. She subsequently was placed on milrinone with improvement in hemodynamics. She was seen by palliative care and made DO NOT RESUSCITATE/DO NOT INTUBATE. She was not felt to be a candidate for CVVHD, home inotropes, or mechanical support. In that setting, milrinone was weaned. She was subsequently discharged to [facility] can place, where she  did better than expected and was discharged back home with her daughter after about 2 weeks and be can place. Since being back at home, she says she has been stable. She does wear oxygen but notes that her breathing has been stable. She is not weighing herself. She does have mild lower extremity edema and continues of dyspnea on exertion, but isn't significantly bothered by it. She has not been having PND, orthopnea, dizziness, syncope, palpitations, chest pain, or early satiety.  Home Medications    Prior to Admission medications   Medication Sig Start  Date End Date Taking? Authorizing Provider  albuterol (PROVENTIL) (2.5 MG/3ML) 0.083% nebulizer solution Take 2.5 mg by nebulization every 4 (four) hours as needed for wheezing or shortness of breath.    Historical Provider, MD  amiodarone (PACERONE) 200 MG tablet Take 1 tablet (200 mg total) by mouth 2 (two) times daily. 02/04/16   Richarda Overlie, MD  furosemide (LASIX) 40 MG tablet Take 80 mg by mouth 2 (two) times daily. 02/22/16   Historical Provider, MD    Review of Systems    Chronic dyspnea on exertion mild lower extremity edema. Overall doing reasonably well all things considered..  All other systems reviewed and are otherwise negative except as noted above.  Physical Exam    VS:  BP 110/52 mmHg  Pulse 60  Ht 5\' 6"  (1.676 m)  Wt 164 lb 6.4 oz (74.571 kg)  BMI 26.55 kg/m2 , BMI Body mass index is 26.55 kg/(m^2). GEN: Well nourished, well developed, in no acute distress. HEENT: normal. Neck: Supple, no JVD, carotid bruits, or masses. Cardiac: RRR, 3/6 systolic murmur loudest at the apex but heard throughout. Positive S4. No rubs, or gallops. No clubbing, cyanosis, 1+ bilateral ankle edema.  Radials/DP/PT 2+ and equal bilaterally.  Respiratory:  Respirations regular and unlabored, crackles noted at the left base. Otherwise clear to auscultation. GI: Soft, nontender, nondistended, BS + x 4. MS: no deformity or atrophy. Skin: warm and dry, no rash. Neuro:  Strength and sensation are intact. Psych: Normal affect.  Accessory Clinical Findings    Basic metabolic panel pending  Assessment & Plan    1.  Chronic combined systolic and diastolic just heart failure/nonischemic cardiomyopathy: Status post recent hospitalization in late May. She has been under the care of palliative care and doing quite well. She has stable dyspnea on exertion as well as mild lower extremity edema.  We discussed the importance of daily weights, sodium restriction, medication compliance, and symptom reporting  and she verbalizes understanding.  She is now living at her daughter's home. She does have evidence of volume overload on exam with elevated neck veins and edema. I have asked her to take an extra Lasix tomorrow morning and Saturday morning. Otherwise, she will remain on 80 twice a day.  I have provided her with a prescription to have home health draw a basic metabolic panel from her PICC line tomorrow morning. Of note, she is not on beta blocker, ACE inhibitor, ARB,ARNI, or spironolactone secondary to hypotension during hospitalization.  2. Paroxysmal atrial fibrillation: She has not had any recent palpitations. She remains on amiodarone. She tolerated resumption of amiodarone during recent admission with stable liver function tests. Previous elevations likely secondary to passive congestion in the setting of significant volume overload. In the setting of palliative care, eliquis was discontinued.  3. Stage III chronic kidney disease: Follow-up creatinine.  4. Disposition: Basic metabolic panel to be checked by home health tomorrow. Prescription provided. Follow-up  in 3-4 weeks.  Nicolasa Ducking, NP 03/02/2016, 4:29 PM

## 2016-03-02 NOTE — Patient Instructions (Addendum)
Ward Givens, NP, has recommended making the following medication changes: 1. INCREASE Furosemide to 3 tablets in the morning and 2 tablets in the afternoon for 2 days and THEN RESUME 2 tablets twice daily  Your physician recommends that you return for lab work on 03/03/2016 via home health  Your physician recommends that you schedule a follow-up appointment in: 1 month with Dr Royann Shivers or Corine Shelter, [place]  If you need a refill on your cardiac medications before your next appointment, please call your pharmacy.

## 2016-03-16 ENCOUNTER — Telehealth: Payer: Self-pay | Admitting: Cardiology

## 2016-03-16 NOTE — Telephone Encounter (Signed)
Received records from [facility] for appointment on 04/07/16 with Corine Shelter, PA .  Records given to [employer] (medical records) for Luke's schedule on 04/07/16. lp

## 2016-03-30 ENCOUNTER — Telehealth: Payer: Self-pay | Admitting: Cardiovascular Disease

## 2016-03-30 ENCOUNTER — Ambulatory Visit (INDEPENDENT_AMBULATORY_CARE_PROVIDER_SITE_OTHER): Admitting: Acute Care

## 2016-03-30 ENCOUNTER — Encounter: Payer: Self-pay | Admitting: Acute Care

## 2016-03-30 VITALS — BP 102/66 | HR 78 | Ht 66.0 in | Wt 163.0 lb

## 2016-03-30 DIAGNOSIS — R0600 Dyspnea, unspecified: Secondary | ICD-10-CM | POA: Insufficient documentation

## 2016-03-30 DIAGNOSIS — J849 Interstitial pulmonary disease, unspecified: Secondary | ICD-10-CM

## 2016-03-30 NOTE — Telephone Encounter (Signed)
Returned call to pt's daughter, ok per DPr, spoke to [place]. She said her mom's legs have increased in swelling over the past two weeks. It is pitting. Pt denies SOB, CP, Dizziness, nausea, vomiting. Pt weights as follows on 6/24- 160 lb;    7/17- 165 lb;    7/18- 164.5 lb. BP today 102/68, yesterday 95/66, other daily readings over the past week are 103/69, 91/65, and 93/60.  She is taking her lasix as stated on her med list. Will route to Dr Royann Shivers for advice.

## 2016-03-30 NOTE — Progress Notes (Signed)
History of Present Illness Theresa Cortez is a 82 y.o. 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   7/20/2017Hospital Follow Up Appointment: Pt. Presents today for hospital follow up.Patient  was discharged from the hospital to to [name] place 02/04/2016 after hospitalization for acute on chronic mixed systolic/diastolic heart failure. She was seen by the CCM service for during this admission.This was a palliative care admission to [facility]. However, she did well  for 2 weeks and then was discharged to home . She is being cared for by her daughters in a palliation manner. She is continuing her medications for her heart failure and is doing well. She is currently on oxygen at 2 L [place], which we may be able to wean today. We have decreased her oxygen to 1 L to see if she can maintain her saturations while in the office today to see if we can give her some periods of time off her oxygen. She has some dyspnea, but she is not bothered by it. She denies fever, she sleeps in a bed with the head elevated. She has a productive cough with clear secretions. She denies hemoptysis.  She does not complain of dyspnea since she returned home.She is taking Glucerna 1 bottle daily.She is rarely using her morphine. She is using her Proventil neb treatments about once daily, mainly at night.She is doing well from a respiratory standpoint. She has an appointment with cardiology next week. The family has noticed that she has more edema in her legs over the last 2 weeks. She is taking her lasix as directed and the family are weighing her daily. They plan to call cardiology for advice regarding her lasix dosing with weight gain.All things considered she is doing well, and receiving excellent care at home. Per CT scan done as an inpatient, there was an finding of UIP, however considering the fact she  is being cared for in a palliative manner full work up and treatment of ILD would be of no benefit to her at this point.  Tests: CT Chest: 01/24/2016 IMPRESSION: 1. Spectrum of findings suggestive of interstitial lung disease, including prominent patchy basilar-predominant regions of subpleural reticulation, subpleural lines, traction bronchiectasis and possible early honeycombing. Findings were present at the lung bases on 12/16/2015 CT abdomen study, with possible progression of these findings since 12/16/2015. 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 Pulmonology consultation advised.  01/25/2016: Echo: The estimated ejection fraction was in the  range of 25% to 30%. Hypokinesisof the anteroseptal, inferoseptal  and inferior walls.  Past medical hx Past Medical History  Diagnosis Date  . Hypertensive heart disease   . Wound infection (HCC)     Right knee   . Hypothyroidism   . History of diverticulosis   . Hyperlipidemia   . Type II diabetes mellitus (HCC)   . History of blood transfusion X 2    w/knee replacement  . Stroke [facility]) 2000    denies residual on 11/23/2015  . Rheumatoid arthritis(714.0)   . DJD (degenerative joint disease)   . Arthritis     "hands, arms" (11/23/2015)  . CKD (chronic kidney disease), stage III   . Chronic combined systolic and diastolic heart failure (HCC)     a. 01/2016 Echo: EF 25-30%, mild LVH, antsept, infsept, inf HK,  mod to sev MR, mod dil LA, mild TR, PASP .  . Paroxysmal atrial fibrillation (HCC)   . Interstitial lung disease (HCC)   . NICM (nonischemic cardiomyopathy) (HCC)     a. 11/2015 Cath: mild nonobs dzs; b. 01/2016 Echo: EF 25-30%, mild LVH, antsept, infsept, inf HK, mod to sev MR, mod dil LA, mild TR, PASP .  . Mitral regurgitation     a. 01/2016 Mod to sev by echo.     Past surgical hx, Family hx,  Social hx all reviewed.  Current Outpatient Prescriptions on File Prior to Visit  Medication Sig  . albuterol (PROVENTIL) (2.5 MG/3ML) 0.083% nebulizer solution Take 2.5 mg by nebulization every 4 (four) hours as needed for wheezing or shortness of breath.  Marland Kitchen amiodarone (PACERONE) 200 MG tablet Take 1 tablet (200 mg total) by mouth 2 (two) times daily.  . furosemide (LASIX) 40 MG tablet 3 tabs by mouth in the mornings on M/W/F, 2 tabs the other mornings.  2 tabs by mouth every evening.   No current facility-administered medications on file prior to visit.     Allergies  Allergen Reactions  . Penicillins Swelling    Has patient had a PCN reaction causing immediate rash, facial/tongue/throat swelling, SOB or lightheadedness with hypotension: unknown Has patient had a PCN reaction causing severe rash involving mucus membranes or skin necrosis: unknown Has patient had a PCN reaction that required hospitalization: unknown Has patient had a PCN reaction occurring within the last 10 years: unknown If all of the above answers are "NO", then may proceed with Cephalosporin use.     Review Of Systems:  Constitutional:   No  weight loss, night sweats,  Fevers, chills, +fatigue, or  +lassitude.  HEENT:   No headaches,  Difficulty swallowing,  Tooth/dental problems, or  Sore throat,                No sneezing, itching, ear ache, nasal congestion, post nasal drip,   CV:  No chest pain,  Orthopnea, PND, ++ swelling in lower extremities, no anasarca, dizziness, palpitations, syncope.   GI  No heartburn, indigestion, abdominal pain, nausea, vomiting, diarrhea, change in bowel habits, loss of appetite, bloody stools.   Resp: No shortness of breath with exertion or at rest.  No excess mucus, no productive cough,  No non-productive cough,  No coughing up of blood.  No change in color of mucus.  No wheezing.  No chest wall deformity  Skin: no rash or lesions.  GU: no dysuria, change in color of urine,  no urgency or frequency.  No flank pain, no hematuria   MS:  No joint pain or swelling.  No decreased range of motion.  No back pain.  Psych:  No change in mood or affect. No depression or anxiety.  No memory loss.   Vital Signs BP 102/66 mmHg  Pulse 78  Ht 5\' 6"  (1.676 m)  Wt 163 lb (73.936 kg)  BMI 26.32 kg/m2  SpO2 100%   Physical Exam:  General- No distress,  A&Ox3, elderly female in wheelchair, flat affect ENT: No sinus tenderness, TM clear, pale nasal mucosa, no oral exudate,no post nasal drip, no LAN Cardiac: S1, S2, regular rate and rhythm, no murmur Chest: No wheeze/ rales/ dullness; no accessory muscle use, no nasal flaring, no sternal retractions Abd.: Soft Non-tender Ext: No clubbing cyanosis, 2+ edema lower extremities. Neuro:  MAE x 4, deconditioned. Skin: No rashes, warm and dry Psych: normal mood and behavior  Assessment/Plan  Interstitial lung disease (HCC) Incidental finding of UIP per Ct 01/24/2016 Patient remains on Amiodarone for tx. PAF End stage Mixed systolic/ diastolic heart failure EF>> 01/25/2016:  25% to 30%. Hypokinesisof the anteroseptal, inferoseptal  and inferior walls. Plan: Due to the end stage CHF there would be no benefit derived from work up and treatment of this new finding. Continue oxygen as needed for dyspnea Maintain saturations > 93% Follow up in 1 month with De. Mannam.   Dyspnea We will check your oxygen saturations today on 1 L [place].  Dyspnea 2/2 End Stage Mixed Systolic/ Diastolic HF EF>> 82-50% ( 01/2016)  Saturations are 100% on 1 L. It is ok to give Ms. Albino breaks from her oxygen as long as she is doing well and is not short of breath. Remember saturation goals are above 93%. Continue your nebulizer treatments as needed for shortness of breath or wheezing. Continue the Delsym cough syrup for cough. You can try Pepcid 20 mg at bedtime for night time cough.( This may be reflux related) Continue morphine as  prescribed for pain. Follow up in 1 month with Dr. Isaiah Serge or myself. Please contact office for sooner follow up if symptoms do not improve or worsen or seek emergency care       Bevelyn Ngo, NP 03/30/2016  1:50 PM

## 2016-03-30 NOTE — Assessment & Plan Note (Signed)
Incidental finding of UIP per Ct 01/24/2016 Patient remains on Amiodarone for tx. PAF End stage Mixed systolic/ diastolic heart failure EF>> 01/25/2016:  25% to 30%. Hypokinesisof the anteroseptal, inferoseptal  and inferior walls. Plan: Due to the end stage CHF there would be no benefit derived from work up and treatment of this new finding. Continue oxygen as needed for dyspnea Maintain saturations > 93% Follow up in 1 month with De. Mannam.

## 2016-03-30 NOTE — Patient Instructions (Addendum)
It is nice to meet you today. We will check your oxygen saturations today on 1 L [place]. Saturations are 100% on 1 L. It is ok to give Theresa Cortez breaks from her oxygen as long as she is doing well and is not short of breath. Remember saturation goals are above 93%. Continue your nebulizer treatments as needed for shortness of breath or wheezing. Continue the Delsym cough syrup for cough. You can try Pepcid 20 mg at bedtime for night time cough.( This may be reflux related) Continue morphine as prescribed for pain. Follow up in 1 month with Dr. Isaiah Serge or myself. Please contact office for sooner follow up if symptoms do not improve or worsen or seek emergency care

## 2016-03-30 NOTE — Telephone Encounter (Signed)
Ok, then increase to 120 mg AM + 80 mg PM every day until her followup appt next week

## 2016-03-30 NOTE — Telephone Encounter (Signed)
I am confused about her diuretic dose. She should be taking: - 120 mg AM and 80 mg PM on Mon, Wed, Fri -   80 mg AM and 80 mg PM on other 4 days a week. Please confirm that she is taking it like that.

## 2016-03-30 NOTE — Telephone Encounter (Signed)
New message     Pt c/o swelling: STAT is pt has developed SOB within 24 hours  1. How long have you been experiencing swelling? The last 2 weeks 2. Where is the swelling located? The legs  3.  Are you currently taking a "fluid pill"?yes  4.  Are you currently SOB? no  5.  Have you traveled recently?no  Pt c/o medication issue:  1. Name of Medication: Lasix  2. How are you currently taking this medication (dosage and times per day)? 40 mg po daily, on Monday, Tuesday and Friday takes 3 pill and Tuesday and thurday 1 pill  3. Are you having a reaction (difficulty breathing--STAT)? no  4. What is your medication issue? The daughter was instructed to call the clinic back and re-adjust the fluid pill cause of weight gain

## 2016-03-30 NOTE — Telephone Encounter (Signed)
What you have written is exactly how the daughter said she was taking it. I clarified the dosage as you have written with the daughter.

## 2016-03-30 NOTE — Telephone Encounter (Signed)
New message    The pt has a weight gain with fluid   Pt c/o swelling: STAT is pt has developed SOB within 24 hours  1. How long have you been experiencing swelling? The daughter states about 2 weeks  2. Where is the swelling located? Swelling in the legs 3.  Are you currently taking a "fluid pill"?yes 4.  Are you currently SOB? no  5.  Have you traveled recently?no  Pt c/o medication issue:  1. Name of Medication: Lasix  2. How are you currently taking this medication (dosage and times per day)? Monday, wednesday ,Friday pt takes  3 tablets 40 mg po daily and Tues and Thursday pt take 2 tablet of 40 mg tablet 3. Are you having a reaction (difficulty breathing--STAT)? no  4. What is your medication issue? The daughter was instructed to call and re-adjust the fluid pill, if there was a weight gain

## 2016-03-30 NOTE — Assessment & Plan Note (Signed)
We will check your oxygen saturations today on 1 L [place].  Dyspnea 2/2 End Stage Mixed Systolic/ Diastolic HF EF>> 56-25% ( 01/2016)  Saturations are 100% on 1 L. It is ok to give Theresa Cortez breaks from her oxygen as long as she is doing well and is not short of breath. Remember saturation goals are above 93%. Continue your nebulizer treatments as needed for shortness of breath or wheezing. Continue the Delsym cough syrup for cough. You can try Pepcid 20 mg at bedtime for night time cough.( This may be reflux related) Continue morphine as prescribed for pain. Follow up in 1 month with Dr. Isaiah Cortez or myself. Please contact office for sooner follow up if symptoms do not improve or worsen or seek emergency care

## 2016-03-31 MED ORDER — FUROSEMIDE 40 MG PO TABS: ORAL_TABLET | ORAL | Status: DC

## 2016-03-31 NOTE — Telephone Encounter (Signed)
I spoke with pt's daughter and gave her instructions from Dr. Royann Shivers. Will send new prescription tor furosemide to [facility] on [address]

## 2016-04-07 ENCOUNTER — Ambulatory Visit (INDEPENDENT_AMBULATORY_CARE_PROVIDER_SITE_OTHER): Admitting: Cardiology

## 2016-04-07 ENCOUNTER — Encounter: Payer: Self-pay | Admitting: Cardiology

## 2016-04-07 DIAGNOSIS — I429 Cardiomyopathy, unspecified: Secondary | ICD-10-CM

## 2016-04-07 DIAGNOSIS — I480 Paroxysmal atrial fibrillation: Secondary | ICD-10-CM

## 2016-04-07 DIAGNOSIS — I428 Other cardiomyopathies: Secondary | ICD-10-CM

## 2016-04-07 DIAGNOSIS — N184 Chronic kidney disease, stage 4 (severe): Secondary | ICD-10-CM

## 2016-04-07 MED ORDER — AMIODARONE HCL 200 MG PO TABS: 200.0000 mg | ORAL_TABLET | Freq: Every day | ORAL | 0 refills | Status: DC

## 2016-04-07 NOTE — Patient Instructions (Signed)
Your physician has recommended you make the following change in your medication:   1.) the amiodarone has been decreased to 1 (200mg  tablet) daily.    Your physician recommends that you schedule a follow-up appointment in: 1 month with Dr. .

## 2016-04-07 NOTE — Assessment & Plan Note (Signed)
Her wgts run between 160-165 lbs and seem to be stable.

## 2016-04-07 NOTE — Assessment & Plan Note (Addendum)
Holding NSR-on Amiodarone 200 mg BID CHADS VASc=5, not on anticoagulation as pt is DNR

## 2016-04-07 NOTE — Assessment & Plan Note (Signed)
Last SCr was 2.5 in May 2017

## 2016-04-07 NOTE — Assessment & Plan Note (Signed)
11/2015 Cath: mild nonobs dzs; b. 01/2016 Echo: EF 25-30%

## 2016-04-07 NOTE — Progress Notes (Signed)
04/07/2016 JUNIOR KENEDY   11-20-1929  [id number]  Primary Physician Gwenyth Bender, MD Primary Cardiologist: Dr Royann Shivers  HPI:  82 y/o AA female with a history of PAF, NICM, CRI-4, DM, and failure to thrive. She is cared for by her family at home, she was in [facility] this spring after an admission for CHF and PAF. She saw Ward Givens a month ago. Her daughters keep a log of her wgts and they seem pretty steady 160-165, though she has had a recent trend up. The pt is in a wheel chair and nodded off frequently during the visit. Her family says she is stable no real change except for some anorexia and nause with occasional vomiting after taking medications.    Current Outpatient Prescriptions  Medication Sig Dispense Refill  . albuterol (PROVENTIL) (2.5 MG/3ML) 0.083% nebulizer solution Take 2.5 mg by nebulization every 4 (four) hours as needed for wheezing or shortness of breath.    Marland Kitchen amiodarone (PACERONE) 200 MG tablet Take 1 tablet (200 mg total) by mouth daily. 30 tablet 0  . benzonatate (TESSALON) 100 MG capsule Take 100 mg by mouth 3 (three) times daily as needed for cough.    . furosemide (LASIX) 40 MG tablet Take 3 tablets by mouth every AM and 2 tablets every PM 150 tablet 3  . LORazepam (ATIVAN) 0.5 MG tablet 1-2 every 4 hours as needed    . Morphine Sulfate (MORPHINE CONCENTRATE) 10 mg / 0.5 ml concentrated solution as directed.  0   No current facility-administered medications for this visit.     Allergies  Allergen Reactions  . Penicillins Swelling    Has patient had a PCN reaction causing immediate rash, facial/tongue/throat swelling, SOB or lightheadedness with hypotension: unknown Has patient had a PCN reaction causing severe rash involving mucus membranes or skin necrosis: unknown Has patient had a PCN reaction that required hospitalization: unknown Has patient had a PCN reaction occurring within the last 10 years: unknown If all of the above answers are "NO", then  may proceed with Cephalosporin use.     Social History   Social History  . Marital status: Widowed    Spouse name: N/A  . Number of children: N/A  . Years of education: N/A   Occupational History  . Not on file.   Social History Main Topics  . Smoking status: Never Smoker  . Smokeless tobacco: Former Neurosurgeon    Types: Snuff  . Alcohol use No  . Drug use: No  . Sexual activity: No   Other Topics Concern  . Not on file   Social History Narrative  . No narrative on file     Review of Systems: General: negative for chills, fever, night sweats or weight changes.  Cardiovascular: negative for chest pain, dyspnea on exertion, edema, orthopnea, palpitations, paroxysmal nocturnal dyspnea or shortness of breath Dermatological: negative for rash Respiratory: negative for cough or wheezing Urologic: negative for hematuria Abdominal: negative for nausea, vomiting, diarrhea, bright red blood per rectum, melena, or hematemesis Neurologic: negative for visual changes, syncope, or dizziness All other systems reviewed and are otherwise negative except as noted above.    Blood pressure 102/66, pulse 68, height 5\' 6"  (1.676 m), weight 165 lb 8 oz (75.1 kg).  General appearance: cooperative and no distress Lungs: decreased breath sounds Heart: regular rate and rhythm Extremities: 1+ pre tibial edema Neurologic: Grossly normal, lethargic but responsive   ASSESSMENT AND PLAN:   Chronic combined systolic and  diastolic congestive heart failure (HCC) Her wgts run between 160-165 lbs and seem to be stable.  CKD (chronic kidney disease), stage IV (HCC) Last SCr was 2.5 in May 2017  PAF (paroxysmal atrial fibrillation) (HCC) Holding NSR-on Amiodarone 200 mg BID CHADS VASc=5, not on anticoagulation as pt is DNR  NICM (nonischemic cardiomyopathy) (HCC) 11/2015 Cath: mild nonobs dzs; b. 01/2016 Echo: EF 25-30%   PLAN  It appears the pt has end stage CHF. I di not feel it appropriate to  draw labs. I did cut her Amiodarone back to 200 mg daily and suggested she might try Prilosec OTC Q HS.   Corine Shelter PA-C 04/07/2016 3:20 PM

## 2016-04-09 ENCOUNTER — Emergency Department (HOSPITAL_COMMUNITY)
Admission: EM | Admit: 2016-04-09 | Discharge: 2016-04-10 | Disposition: A | Discharge status: Home / Self Care | Attending: Emergency Medicine | Admitting: Emergency Medicine

## 2016-04-09 ENCOUNTER — Emergency Department (HOSPITAL_COMMUNITY)

## 2016-04-09 ENCOUNTER — Encounter (HOSPITAL_COMMUNITY): Payer: Self-pay

## 2016-04-09 DIAGNOSIS — R1032 Left lower quadrant pain: Secondary | ICD-10-CM | POA: Insufficient documentation

## 2016-04-09 DIAGNOSIS — Z8673 Personal history of transient ischemic attack (TIA), and cerebral infarction without residual deficits: Secondary | ICD-10-CM | POA: Insufficient documentation

## 2016-04-09 DIAGNOSIS — I5042 Chronic combined systolic (congestive) and diastolic (congestive) heart failure: Secondary | ICD-10-CM | POA: Diagnosis not present

## 2016-04-09 DIAGNOSIS — I2510 Atherosclerotic heart disease of native coronary artery without angina pectoris: Secondary | ICD-10-CM | POA: Insufficient documentation

## 2016-04-09 DIAGNOSIS — Y939 Activity, unspecified: Secondary | ICD-10-CM | POA: Insufficient documentation

## 2016-04-09 DIAGNOSIS — Z96651 Presence of right artificial knee joint: Secondary | ICD-10-CM | POA: Diagnosis not present

## 2016-04-09 DIAGNOSIS — I130 Hypertensive heart and chronic kidney disease with heart failure and stage 1 through stage 4 chronic kidney disease, or unspecified chronic kidney disease: Secondary | ICD-10-CM | POA: Diagnosis not present

## 2016-04-09 DIAGNOSIS — E1122 Type 2 diabetes mellitus with diabetic chronic kidney disease: Secondary | ICD-10-CM | POA: Diagnosis not present

## 2016-04-09 DIAGNOSIS — E039 Hypothyroidism, unspecified: Secondary | ICD-10-CM | POA: Diagnosis not present

## 2016-04-09 DIAGNOSIS — R1031 Right lower quadrant pain: Secondary | ICD-10-CM | POA: Diagnosis not present

## 2016-04-09 DIAGNOSIS — W19XXXA Unspecified fall, initial encounter: Secondary | ICD-10-CM | POA: Diagnosis not present

## 2016-04-09 DIAGNOSIS — Y999 Unspecified external cause status: Secondary | ICD-10-CM | POA: Diagnosis not present

## 2016-04-09 DIAGNOSIS — M25551 Pain in right hip: Secondary | ICD-10-CM | POA: Insufficient documentation

## 2016-04-09 DIAGNOSIS — M549 Dorsalgia, unspecified: Secondary | ICD-10-CM

## 2016-04-09 DIAGNOSIS — N183 Chronic kidney disease, stage 3 (moderate): Secondary | ICD-10-CM | POA: Diagnosis not present

## 2016-04-09 DIAGNOSIS — Y929 Unspecified place or not applicable: Secondary | ICD-10-CM | POA: Insufficient documentation

## 2016-04-09 LAB — URINALYSIS, ROUTINE W REFLEX MICROSCOPIC
Bilirubin Urine: NEGATIVE
GLUCOSE, UA: NEGATIVE mg/dL
HGB URINE DIPSTICK: NEGATIVE
Ketones, ur: NEGATIVE mg/dL
Leukocytes, UA: NEGATIVE
Nitrite: NEGATIVE
PH: 5 (ref 5.0–8.0)
Protein, ur: NEGATIVE mg/dL
SPECIFIC GRAVITY, URINE: 1.019 (ref 1.005–1.030)

## 2016-04-09 LAB — CBC WITH DIFFERENTIAL/PLATELET
Basophils Absolute: 0 10*3/uL (ref 0.0–0.1)
Basophils Relative: 0 %
EOS PCT: 1 %
Eosinophils Absolute: 0 10*3/uL (ref 0.0–0.7)
HEMATOCRIT: 38 % (ref 36.0–46.0)
Hemoglobin: 12 g/dL (ref 12.0–15.0)
LYMPHS ABS: 1.9 10*3/uL (ref 0.7–4.0)
LYMPHS PCT: 48 %
MCH: 30.8 pg (ref 26.0–34.0)
MCHC: 31.6 g/dL (ref 30.0–36.0)
MCV: 97.4 fL (ref 78.0–100.0)
MONO ABS: 0.3 10*3/uL (ref 0.1–1.0)
MONOS PCT: 7 %
Neutro Abs: 1.8 10*3/uL (ref 1.7–7.7)
Neutrophils Relative %: 44 %
PLATELETS: 142 10*3/uL — AB (ref 150–400)
RBC: 3.9 MIL/uL (ref 3.87–5.11)
RDW: 18.3 % — AB (ref 11.5–15.5)
WBC: 4 10*3/uL (ref 4.0–10.5)

## 2016-04-09 LAB — COMPREHENSIVE METABOLIC PANEL
ALK PHOS: 140 U/L — AB (ref 38–126)
ALT: 62 U/L — ABNORMAL HIGH (ref 14–54)
ANION GAP: 13 (ref 5–15)
AST: 57 U/L — ABNORMAL HIGH (ref 15–41)
Albumin: 2.7 g/dL — ABNORMAL LOW (ref 3.5–5.0)
BUN: 51 mg/dL — ABNORMAL HIGH (ref 6–20)
CALCIUM: 9.4 mg/dL (ref 8.9–10.3)
CO2: 25 mmol/L (ref 22–32)
Chloride: 98 mmol/L — ABNORMAL LOW (ref 101–111)
Creatinine, Ser: 3.05 mg/dL — ABNORMAL HIGH (ref 0.44–1.00)
GFR, EST AFRICAN AMERICAN: 15 mL/min — AB (ref >60)
GFR, EST NON AFRICAN AMERICAN: 13 mL/min — AB (ref >60)
Glucose, Bld: 130 mg/dL — ABNORMAL HIGH (ref 65–99)
Potassium: 3.9 mmol/L (ref 3.5–5.1)
Sodium: 136 mmol/L (ref 135–145)
Total Bilirubin: 2.1 mg/dL — ABNORMAL HIGH (ref 0.3–1.2)
Total Protein: 7.3 g/dL (ref 6.5–8.1)

## 2016-04-09 LAB — LIPASE, BLOOD: LIPASE: 33 U/L (ref 11–51)

## 2016-04-09 MED ORDER — ACETAMINOPHEN 500 MG PO TABS
1000.0000 mg | ORAL_TABLET | Freq: Once | ORAL | Status: AC
  Administered 2016-04-09: 1000 mg via ORAL
  Filled 2016-04-09: qty 2

## 2016-04-09 MED ORDER — OXYCODONE HCL 5 MG PO TABS
5.0000 mg | ORAL_TABLET | Freq: Once | ORAL | Status: AC
  Administered 2016-04-09: 5 mg via ORAL
  Filled 2016-04-09: qty 1

## 2016-04-09 NOTE — ED Triage Notes (Signed)
Pt complaining of R hip, groin and knee pain. Pt also complaining of L hip pain. Denies any fall/trauma. States son was helping her to move from chair to wheelchair when pain started. States painful to walk. States pain ongoing a few days.

## 2016-04-10 NOTE — ED Provider Notes (Signed)
[facility]-EMERGENCY DEPT Provider Note   CSN: [id number] Arrival date & time: 04/09/16  1818  First Provider Contact:  First MD Initiated Contact with Patient 04/09/16 2039        History   Chief Complaint Chief Complaint  Patient presents with  . Hip Pain  . Groin Pain  . Knee Pain    HPI Theresa Cortez is a 82 y.o. female.  82 yo F with R hip pain.  Going on for past couple days, recurrent problem for the patient.  About a week ago fell down and was lifted by a family member.  Having pain where she was grasped to be raised.  Today pain so severe unable to lift her leg off the bed.  Denies fevers, chills.  Hx of chronic right sided sciatica, unsure if this is worse.  Some constipation.  Denies worsening edema, cp, sob.   The history is provided by the patient.  Hip Pain  This is a recurrent problem. The current episode started 12 to 24 hours ago. The problem occurs constantly. The problem has been gradually worsening. Associated symptoms include abdominal pain. Pertinent negatives include no chest pain, no headaches and no shortness of breath. The symptoms are aggravated by walking, bending and twisting. Nothing relieves the symptoms. She has tried nothing for the symptoms. The treatment provided no relief.  Groin Pain  Associated symptoms include abdominal pain. Pertinent negatives include no chest pain, no headaches and no shortness of breath.  Knee Pain      Past Medical History:  Diagnosis Date  . Arthritis    "hands, arms" (11/23/2015)  . Chronic combined systolic and diastolic heart failure (HCC)    a. 01/2016 Echo: EF 25-30%, mild LVH, antsept, infsept, inf HK, mod to sev MR, mod dil LA, mild TR, PASP .  . CKD (chronic kidney disease), stage III   . DJD (degenerative joint disease)   . History of blood transfusion X 2   w/knee replacement  . History of diverticulosis   . Hyperlipidemia   . Hypertensive heart disease   . Hypothyroidism   . Interstitial lung  disease (HCC)   . Mitral regurgitation    a. 01/2016 Mod to sev by echo.  Marland Kitchen NICM (nonischemic cardiomyopathy) (HCC)    a. 11/2015 Cath: mild nonobs dzs; b. 01/2016 Echo: EF 25-30%, mild LVH, antsept, infsept, inf HK, mod to sev MR, mod dil LA, mild TR, PASP .  . Paroxysmal atrial fibrillation (HCC)   . Rheumatoid arthritis(714.0)   . Stroke [facility]) 2000   denies residual on 11/23/2015  . Type II diabetes mellitus (HCC)   . Wound infection (HCC)    Right knee     Patient Active Problem List   Diagnosis Date Noted  . Dyspnea 03/30/2016  . NICM (nonischemic cardiomyopathy) (HCC)   . Paroxysmal atrial fibrillation (HCC)   . CKD (chronic kidney disease), stage IV (HCC)   . Hypertensive heart disease   . PAF (paroxysmal atrial fibrillation) (HCC)   . Encounter for palliative care   . Goals of care, counseling/discussion   . AKI (acute kidney injury) (HCC)   . Acute on chronic systolic (congestive) heart failure (HCC)   . Interstitial lung disease (HCC)   . Cardiorenal disease 01/13/2016  . Nonischemic cardiomyopathy (HCC) 12/21/2015  . Coronary artery disease 12/21/2015  . Chronic renal insufficiency, stage IV (severe) 12/21/2015  . Fluid overload 12/20/2015  . Chronic combined systolic and diastolic congestive heart failure (HCC) 12/20/2015  .  Adnexal cyst - left 12/16/2015  . Protein-calorie malnutrition, moderate (HCC) 12/16/2015  . HLD (hyperlipidemia) 12/15/2015  . Hypothyroidism 12/15/2015  . RA (rheumatoid arthritis) (HCC) 12/15/2015  . Hyponatremia 12/15/2015  . Hypotension 12/15/2015  . Arterial hypotension   . Positive D dimer   . Mitral regurgitation   . Tricuspid regurgitation   . Pulmonary hypertension (HCC)   . DM type 2 (diabetes mellitus, type 2) (HCC) 10/11/2012    Past Surgical History:  Procedure Laterality Date  . APPENDECTOMY    . CARDIAC CATHETERIZATION N/A 11/25/2015   Procedure: Right/Left Heart Cath and Coronary Angiography;  Surgeon: Iran Ouch, MD;  Location: [facility] INVASIVE CV LAB;  Service: Cardiovascular;  Laterality: N/A;  . CATARACT EXTRACTION W/ INTRAOCULAR LENS IMPLANT Right   . INCISION AND DRAINAGE MOUTH Right 2014   "knee; took replacement out; put  spacers in"  . KNEE ARTHROSCOPY Right   . MEDIAL PARTIAL KNEE REPLACEMENT Right   . PARTIAL COLECTOMY     descending colon by Dr. Orson Slick  . TOTAL KNEE ARTHROPLASTY Right 08/07/2011; 2014  . TUBAL LIGATION      OB History    No data available       Home Medications    Prior to Admission medications   Medication Sig Start Date End Date Taking? Authorizing Provider  albuterol (PROVENTIL) (2.5 MG/3ML) 0.083% nebulizer solution Take 2.5 mg by nebulization every 4 (four) hours as needed for wheezing or shortness of breath.   Yes Historical Provider, MD  amiodarone (PACERONE) 200 MG tablet Take 1 tablet (200 mg total) by mouth daily. 04/07/16  Yes Luke K Kilroy, PA-C  benzonatate (TESSALON) 100 MG capsule Take 100 mg by mouth 3 (three) times daily as needed for cough.   Yes Historical Provider, MD  furosemide (LASIX) 40 MG tablet Take 3 tablets by mouth every AM and 2 tablets every PM 03/31/16  Yes Mihai Croitoru, MD  LORazepam (ATIVAN) 0.5 MG tablet Take 0.5 mg by mouth. 1-2 every 4 hours as needed    Yes Historical Provider, MD  Morphine Sulfate (MORPHINE CONCENTRATE) 10 mg / 0.5 ml concentrated solution Take 5-10 mg by mouth every 2 (two) hours as needed for moderate pain or shortness of breath (for coughing spells).  02/22/16  Yes Historical Provider, MD    Family History Family History  Problem Relation Age of Onset  . Hypertension Mother     Social History Social History  Substance Use Topics  . Smoking status: Never Smoker  . Smokeless tobacco: Former Neurosurgeon    Types: Snuff  . Alcohol use No     Allergies   Penicillins   Review of Systems Review of Systems  Constitutional: Negative for chills and fever.  HENT: Negative for congestion and rhinorrhea.     Eyes: Negative for redness and visual disturbance.  Respiratory: Negative for shortness of breath and wheezing.   Cardiovascular: Negative for chest pain and palpitations.  Gastrointestinal: Positive for abdominal pain. Negative for nausea and vomiting.  Genitourinary: Negative for dysuria and urgency.  Musculoskeletal: Positive for arthralgias, back pain, gait problem and myalgias.  Skin: Negative for pallor and wound.  Neurological: Negative for dizziness and headaches.     Physical Exam Updated Vital Signs BP 96/69   Pulse 67   Temp 98 F (36.7 C) (Oral)   Resp 16   SpO2 100%   Physical Exam  Constitutional: She is oriented to person, place, and time. She appears well-developed and well-nourished. No distress.  HENT:  Head: Normocephalic and atraumatic.  Eyes: EOM are normal. Pupils are equal, round, and reactive to light.  Neck: Normal range of motion. Neck supple.  Cardiovascular: Normal rate and regular rhythm.  Exam reveals no gallop and no friction rub.   No murmur heard. Pulmonary/Chest: Effort normal. She has no wheezes. She has no rales.  Abdominal: Soft. She exhibits no distension. There is tenderness (worst to the LL and RLQ).  Musculoskeletal: She exhibits no edema or tenderness.  No pain with internal and external rotation of either LE, PMS intact distally. Negative SLR test  Neurological: She is alert and oriented to person, place, and time.  Skin: Skin is warm and dry. She is not diaphoretic.  Psychiatric: She has a normal mood and affect. Her behavior is normal.  Nursing note and vitals reviewed.    ED Treatments / Results  Labs (all labs ordered are listed, but only abnormal results are displayed) Labs Reviewed  CBC WITH DIFFERENTIAL/PLATELET - Abnormal; Notable for the following:       Result Value   RDW 18.3 (*)    Platelets 142 (*)    All other components within normal limits  COMPREHENSIVE METABOLIC PANEL - Abnormal; Notable for the following:     Chloride 98 (*)    Glucose, Bld 130 (*)    BUN 51 (*)    Creatinine, Ser 3.05 (*)    Albumin 2.7 (*)    AST 57 (*)    ALT 62 (*)    Alkaline Phosphatase 140 (*)    Total Bilirubin 2.1 (*)    GFR calc non Af Amer 13 (*)    GFR calc Af Amer 15 (*)    All other components within normal limits  URINALYSIS, ROUTINE W REFLEX MICROSCOPIC (NOT AT [facility]  LIPASE, BLOOD    EKG  EKG Interpretation None       Radiology Ct L-spine No Charge  Result Date: 04/09/2016 CLINICAL DATA:  Back pain. Right hip groin and knee pain. Left hip pain. Pain onset while moving from chair to wheelchair. EXAM: CT LUMBAR SPINE WITHOUT CONTRAST TECHNIQUE: Multidetector CT imaging of the lumbar spine was performed without intravenous contrast administration. Multiplanar CT image reconstructions were also generated. COMPARISON:  No dedicated lumbar spine imaging. Reformats from CT abdomen/ pelvis 12/16/2015 FINDINGS: Mild broad-based leftward curvature of the lumbar spine. No compression fracture. Multilevel facet arthropathy throughout the lumbar spine. Disc space narrowing at L3-L4 with mild endplate spurring. No acute fracture. No bony destructive change. Sacroiliac joints are congruent with mild degeneration. IMPRESSION: Multilevel degenerative change throughout the lumbar spine, primarily facet arthropathy. Mild degenerative disc disease at L3-L4. No acute osseous abnormality. Electronically Signed   By: Rubye Oaks M.D.   On: 04/09/2016 22:24  Ct Renal Stone Study  Result Date: 04/09/2016 CLINICAL DATA:  Right flank pain. Right hip, groin, and left hip pain. Pain onset while moving from chair to wheelchair. EXAM: CT ABDOMEN AND PELVIS WITHOUT CONTRAST TECHNIQUE: Multidetector CT imaging of the abdomen and pelvis was performed following the standard protocol without IV contrast. COMPARISON:  CT 01/24/2016 FINDINGS: Lower chest: Bilateral pleural effusions are new, tiny on the right and small on the left. There  is bronchiectasis in both lower lobes. Liver: No focal abnormality allowing for lack contrast. Small amount of perihepatic fluid. This measures simple fluid density. Hepatobiliary: High-density material within the gallbladder. No calcified gallstone. Pancreas: Soft tissue stranding about the entire pancreas. No ductal dilatation. Spleen: Trace perisplenic fluid. No  focal lesion or evidence of traumatic injury. Adrenal glands: Thickening bilaterally, left greater than right, no focal nodule. Kidneys: Renal cortical thinning. Right greater than left perinephric edema. No hydronephrosis. No urolithiasis. Stomach/Bowel: No abnormal bowel distention or inflammation. Enteric sutures noted in small bowel loops in the left mid abdomen. Moderate stool burden. Scattered colonic diverticulosis without diverticulitis. Vascular/Lymphatic: No retroperitoneal adenopathy. Abdominal aorta is normal in caliber. Atherosclerosis of normal caliber abdominal aorta. Reproductive: Previous left ovarian cyst not well visualized due to adjacent pelvic free fluid. Uterus remains in situ. Bladder: Near completely decompressed and not well evaluated. No bladder stone. Other: Small to moderate volume of intra-abdominal and pelvic ascites. Free fluid is greatest the pelvis. This measures simple fluid density. There is body wall edema, confluent in both flanks. Musculoskeletal: Lumbar spine assessed on concurrently performed lumbar spine CT. No acute rib fracture. Bony pelvis is intact with degenerative change of both hips. IMPRESSION: 1. Findings consistent with fluid overload with small volume abdominal and pelvic ascites, small pleural effusions, and body wall edema. 2. Edema about the pancreas may be secondary to third spacing, however recommend correlation with pancreatic enzymes to exclude pancreatitis. 3. Perinephric edema about both kidneys appears chronic. No obstructive uropathy. Electronically Signed   By: Rubye Oaks M.D.   On:  04/09/2016 22:19   Procedures Procedures (including critical care time)  Medications Ordered in ED Medications  acetaminophen (TYLENOL) tablet 1,000 mg (1,000 mg Oral Given 04/09/16 2003)  oxyCODONE (Oxy IR/ROXICODONE) immediate release tablet 5 mg (5 mg Oral Given 04/09/16 2003)     Initial Impression / Assessment and Plan / ED Course  I have reviewed the triage vital signs and the nursing notes.  Pertinent labs & imaging results that were available during my care of the patient were reviewed by me and considered in my medical decision making (see chart for details).  Clinical Course    82 yo F with CC of R hip pain. Points to the joint on exam, though has no pain with ROM of the hip.  More RLQ and LLQ abdominal pain, though family feels this is where she was grasped to be lifted off the ground.  They also feel that she has this leg weakness when she is "sick".  Lab eval with mild lft elevation, no upper abdominal pain, CT scan with fluid overload.  Patient was recently increased on her lasix, feels that she is less swollen than normal. I am not sure of the etiology of her pain, though significantly improved post oral pain meds.  Will d/c home with PCP follow up for LFT recheck.   I have discussed the diagnosis/risks/treatment options with the patient and family and believe the pt to be eligible for discharge home to follow-up with PCP. We also discussed returning to the ED immediately if new or worsening sx occur. We discussed the sx which are most concerning (e.g., sudden worsening pain, fever, inability to tolerate by mouth) that necessitate immediate return. Medications administered to the patient during their visit and any new prescriptions provided to the patient are listed below.  Medications given during this visit Medications  acetaminophen (TYLENOL) tablet 1,000 mg (1,000 mg Oral Given 04/09/16 2003)  oxyCODONE (Oxy IR/ROXICODONE) immediate release tablet 5 mg (5 mg Oral Given  04/09/16 2003)     The patient appears reasonably screen and/or stabilized for discharge and I doubt any other medical condition or other [facility] requiring further screening, evaluation, or treatment in the ED at this time prior  to discharge.    Final Clinical Impressions(s) / ED Diagnoses   Final diagnoses:  Right hip pain    New Prescriptions Discharge Medication List as of 04/09/2016 11:32 PM       Melene Plan, DO 04/10/16 1459

## 2016-04-19 ENCOUNTER — Telehealth: Payer: Self-pay | Admitting: Cardiovascular Disease

## 2016-04-19 NOTE — Telephone Encounter (Signed)
Spoke to [place] and gave advisement on salt avoidance, recommendation for no med changes at this time, to call back on Fri w update on leg swelling and pt weight. She is aware also to call if new or worse problems.

## 2016-04-19 NOTE — Telephone Encounter (Signed)
Left msg for Theresa Cortez to return call.

## 2016-04-19 NOTE — Telephone Encounter (Signed)
Let's wait and see what happens after she stops the Gatorade and any other salty food/fluids, before increasing the already high dose of Lasix. Can we get another report on her weight and degree of swelling on Friday, please?

## 2016-04-19 NOTE — Telephone Encounter (Signed)
Follow up ° ° ° ° °Returned a call to the nurse °

## 2016-04-19 NOTE — Telephone Encounter (Signed)
Theresa Cortez is calling because hr mother weight is up to 170 and was told to call when she gets up to that in weight . Also her legs swollen and there is a a little fluids leaking from her leg . Please call    Thanks

## 2016-04-19 NOTE — Telephone Encounter (Signed)
Spoke to [place]. Pt has leg swelling, small amt of observed fluid leakage in legs, up abt 5 lbs in the last 5 days. Notes daily weights: 5th - 166 6th - 167 7th - 169.5 8th - 168.5 9th - 170.5  Pt not SOB as observed, but daughter notes she is always on home O2.  Asked about missed doses: She notes pt was seen in ER about 1 week ago w back and abd pain, had skipped dose of lasix that evening but this was the only time it was missed.  She also notes patient was encouraged to keep hydrated, caller has been giving patient water w/ some gatorade mixed in. I advised to discontinue the gatorade. We had an extensive discussion about salt avoidance. She also states pt appetite had been poor recently, and now patient has better appetite and is "eating better". I asked if this meant eating more, she states yes. I encouraged her to look at salt content of foods also.  She is on a high dose of lasix (total 200mg  daily split 120mg  in am and 80mg  in pm).  Pt also has noted CKD and a low GFR. Caller aware I did not feel comfortable giving dose change recommendations on lasix and will route to Dr. to see if this is appropriate and to request advice.  Will return call w recommendations.

## 2016-04-21 NOTE — Telephone Encounter (Signed)
Returned call to [employer]. Down 0.5 lbs yesterday, up 1.5 lbs today, so 171.5 lbs.  Patient not in any apparent distress. She naps a lot, and daughter occasionally wakes her to give her water to drink or meals, patient can usually get to the bathroom and urinate after drinking fluids.  Daughter informs me stopped giving the patient gatorade and states patient cut back on salt as instructed, however then states she "has to lightly season her food, otherwise she won't eat it". We discussed - even a little bit of added salt is not recommended. I reiterated instructions from Wednesday conversation. I clearly advised avoiding condiments/sauces, table salt. I suggested doing ms. Dash or dried spices w/o salt in them if the patient needs added flavoring for her food. She understands it's reasonably difficult for the patient to lose fluid weight if salt intake is not more carefully managed. She voiced agreement w/ my instructions. She is aware I will defer to Dr. Royann Shivers for any recommendations on increasing the lasix.  At this point she is 5.5 lbs up from wt of 166lbs 6 days ago.

## 2016-04-21 NOTE — Telephone Encounter (Signed)
Spoke to [place] - relayed Dr. Erin Hearing advice. She informs me the patient is not in any distress at this time, I advised if she has increase in fatigue or swelling, onset of SOB/coughing, etc, to use on-call service this weekend. O/w, update [place] on Monday as to patient's condition. Reminded her of salt avoidance instructions. She verbalized understanding and thanks.

## 2016-04-21 NOTE — Telephone Encounter (Signed)
Follow Up  Pt calling to speak with Harrold Donath per notes signed 8.9.17 @ 108pm.  Please follow up with pt. Thanks!

## 2016-04-21 NOTE — Telephone Encounter (Signed)
Agree with your device. As long she is not short of breath or in any distress would like to be conservative. Let's touch base with them again on Monday.

## 2016-04-24 MED ORDER — METOLAZONE 2.5 MG PO TABS: 2.5000 mg | ORAL_TABLET | ORAL | 0 refills | Status: DC

## 2016-04-24 NOTE — Telephone Encounter (Signed)
Called back and spoke w Westley Hummer. Communicated physician recommendations w/ instructions on how to administer this medication. Will write for 30 tablets per physician OK. Charlene aware med frequency may be changed and to call Wednesday for further instruction after administration tomorrow. Called to [facility] per patient preference. She voiced acknowledgment and thanks for instruction.

## 2016-04-24 NOTE — Telephone Encounter (Signed)
Followed up w/ Charlene. She notes patient has been having some coughing spells over weekend, Fri, Sat, less so on Sunday. Has been using neb tx at home which improves her symptoms. Notes she was also having these earlier in the week but does not recall particular onset.  I asked about any dyspnea - Charlene states none observed beyond typical. Pt wears O2 at 2L/min via [place] and generally sats around 95-98% at rest, and comes off of this 30-60 mins at a time 2-3 times daily. Notes no increase in work of breathing during times off O2.   Regarding salt avoidance, pt has been compliant w/ this.   Daughter reports continued weight trends:  12th - 174 lbs 13th - 173 lbs Today - *166 lbs* (notes pt not steady and they had to help pt stand on scale - I acknowledged this would alter scale reading)  Aware that I will seek further advice from Dr. Royann Shivers.

## 2016-04-24 NOTE — Telephone Encounter (Signed)
Please give metolazone 2.5 mg 30 minutes before morning furosemide once weekly. Touch bas with [place] again the day after she first takes it to see impact on output and weight.

## 2016-04-24 NOTE — Telephone Encounter (Signed)
Follow up  Pt is returning [place] call on her condition per notes.  Please follow up with pt. Thanks!

## 2016-04-26 NOTE — Telephone Encounter (Signed)
Spoke with lisa, medication directions given. Hospice nurses will draw the lab work in 10 days.

## 2016-04-26 NOTE — Telephone Encounter (Signed)
Spoke with lisa, caregiver in pts home, she reports the pt has just gotten up and her weight today is 173 lb. She took the metolazone yesterday and only went to the bathroom about 5 times. She currently feels fine with no breathing issues. The edema in her feet has not changes. Will forward for dr croitoru's review

## 2016-04-26 NOTE — Telephone Encounter (Signed)
Let's try to do a metolazone 2.5 mg again tomorrow and then every Monday and Thursday. Check basic metabolic panel in about 10 days please

## 2016-05-01 ENCOUNTER — Telehealth: Payer: Self-pay | Admitting: Cardiovascular Disease

## 2016-05-01 NOTE — Telephone Encounter (Signed)
New Message  Pt c/o medication issue:  1. Name of Medication: Metolazone   2. How are you currently taking this medication (dosage and times per day)? 2.5mg   3. Are you having a reaction (difficulty breathing--STAT)? Pt daughter states the meds have bee a lot on pt.   4. What is your medication issue? Pt daughter call requesting to speak with Evaristo Bury about pt meds and if she needs to take a different dosage. Please call back to discuss

## 2016-05-01 NOTE — Telephone Encounter (Signed)
Returned call to [employer]. She informs me that the patient is doing very well since taking the metolazone last week. Notes she had given it Tuesday and this seemed to "jump start" her kidneys working.  Pt wt has been down from 170.5 lbs 1 week ago to current wt of 167.4 yesterday, 167.2 lbs today.  Daughter also informs me since patient has been doing so well since taking the metolazone, she cut dose of lasix in AM back from 60mg  to 40mg , keeping afternoon dose the same at 40mg . I advised to consider resuming the AM dose at 60mg  if patient begins to gain weight again. Informed her to keep metolazone at once weekly dose for now. Aware I will route to Dr. for any further advice at this time. Caller voiced thanks and acknowledgment of instruction.

## 2016-05-02 ENCOUNTER — Telehealth: Payer: Self-pay | Admitting: Cardiovascular Disease

## 2016-05-02 NOTE — Telephone Encounter (Signed)
Attempted to return call, no answer-LMTCB

## 2016-05-02 NOTE — Telephone Encounter (Signed)
New Message  Toniann Fail from [facility] [place] requesting to speak with RN about what labs need to be drawn, on what day and also she states she needs an ICD 10 code. Please call back to discuss

## 2016-05-03 NOTE — Telephone Encounter (Signed)
F/u ° ° ° ° °Returning nurse call.  °

## 2016-05-03 NOTE — Telephone Encounter (Signed)
Recommendations communicated to [facility].

## 2016-05-03 NOTE — Telephone Encounter (Signed)
Left message for wendy to call

## 2016-05-03 NOTE — Telephone Encounter (Signed)
Excellent -we can do this again as needed, but avoid taking as a regular Rx due to risk for of worsening renal failure.

## 2016-05-03 NOTE — Telephone Encounter (Signed)
Left msg for Toniann Fail to call.

## 2016-05-03 NOTE — Telephone Encounter (Signed)
Toniann Fail returned my call, advised OK for BMET w GFR in context of recent med changes, results to be faxed to Dr. Royann Shivers. Confirmed fax number, advised to call If any further needs.

## 2016-05-05 ENCOUNTER — Encounter: Payer: Self-pay | Admitting: Cardiovascular Disease

## 2016-05-10 ENCOUNTER — Encounter: Payer: Self-pay | Admitting: Pulmonary Disease

## 2016-05-10 ENCOUNTER — Ambulatory Visit (INDEPENDENT_AMBULATORY_CARE_PROVIDER_SITE_OTHER): Admitting: Pulmonary Disease

## 2016-05-10 VITALS — BP 116/74

## 2016-05-10 DIAGNOSIS — J849 Interstitial pulmonary disease, unspecified: Secondary | ICD-10-CM | POA: Diagnosis not present

## 2016-05-10 NOTE — Progress Notes (Signed)
Theresa Cortez    [id number]    [birth year]  Primary Care Physician:Eric Shona Simpson, MD  Referring Physician: Gwenyth Bender, MD [address] [place], [place] [address]  Chief complaint:  Follow up for ILD  HPI: Theresa Cortez is a 82 y.o. 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  She was first seen by PCCM service when she was admitted to the hospital in May 2017 for acute on chronic mixed systolic/diastolic heart failure. She has end-stage heart disease and is currently on palliative care, hospice. She is currently at home has been taking care of her daughters. The main focus of her treatment is palliation, symptom management. She continues on supplemental oxygen. She has some dyspnea but is not very active and does not appear to be bothered by it. She has chronic cough which is worse at night. This appears to be increasing in intensity over the past few months. She denies any sputum production, fevers, chills, wheezing. No chest pain, palpitation.  Outpatient Encounter Prescriptions as of 05/10/2016  Medication Sig  . albuterol (PROVENTIL) (2.5 MG/3ML) 0.083% nebulizer solution Take 2.5 mg by nebulization every 4 (four) hours as needed for wheezing or shortness of breath.  Marland Kitchen amiodarone (PACERONE) 200 MG tablet Take 1 tablet (200 mg total) by mouth daily.  . benzonatate (TESSALON) 100 MG capsule Take 100 mg by mouth 3 (three) times daily as needed for cough.  . furosemide (LASIX) 40 MG tablet Take 3 tablets by mouth every AM and 2 tablets every PM  . LORazepam (ATIVAN) 0.5 MG tablet Take 0.5 mg by mouth. 1-2 every 4 hours as needed   . metolazone (ZAROXOLYN) 2.5 MG tablet Take 1 tablet (2.5 mg total) by mouth once a week. Give 30 minutes before morning furosemide (Lasix) dose.  Marland Kitchen Morphine Sulfate (MORPHINE CONCENTRATE) 10 mg / 0.5 ml  concentrated solution Take 5-10 mg by mouth every 2 (two) hours as needed for moderate pain or shortness of breath (for coughing spells).   . prochlorperazine (COMPAZINE) 10 MG tablet Take 10 mg by mouth every 4 (four) hours as needed.   No facility-administered encounter medications on file as of 05/10/2016.     Allergies as of 05/10/2016 - Review Complete 05/10/2016  Allergen Reaction Noted  . Penicillins Swelling 10/11/2012    Past Medical History:  Diagnosis Date  . Arthritis    "hands, arms" (11/23/2015)  . Chronic combined systolic and diastolic heart failure (HCC)    a. 01/2016 Echo: EF 25-30%, mild LVH, antsept, infsept, inf HK, mod to sev MR, mod dil LA, mild TR, PASP .  . CKD (chronic kidney disease), stage III   . DJD (degenerative joint disease)   . History of blood transfusion X 2   w/knee replacement  . History of diverticulosis   . Hyperlipidemia   . Hypertensive heart disease   . Hypothyroidism   . Interstitial lung disease (HCC)   . Mitral regurgitation    a. 01/2016 Mod to sev by echo.  Marland Kitchen NICM (nonischemic cardiomyopathy) (HCC)    a. 11/2015 Cath: mild nonobs dzs; b. 01/2016 Echo: EF 25-30%, mild LVH, antsept, infsept, inf HK, mod to sev MR, mod dil LA, mild TR, PASP .  . Paroxysmal atrial fibrillation (HCC)   . Rheumatoid arthritis(714.0)   . Stroke [facility]) 2000  denies residual on 11/23/2015  . Type II diabetes mellitus (HCC)   . Wound infection (HCC)    Right knee     Past Surgical History:  Procedure Laterality Date  . APPENDECTOMY    . CARDIAC CATHETERIZATION N/A 11/25/2015   Procedure: Right/Left Heart Cath and Coronary Angiography;  Surgeon: Iran Ouch, MD;  Location: [facility];  Service: Cardiovascular;  Laterality: N/A;  . CATARACT EXTRACTION W/ INTRAOCULAR LENS IMPLANT Right   . INCISION AND DRAINAGE MOUTH Right 2014   "knee; took replacement out; put  spacers in"  . KNEE ARTHROSCOPY Right   . MEDIAL PARTIAL KNEE  REPLACEMENT Right   . PARTIAL COLECTOMY     descending colon by Dr. Orson Slick  . TOTAL KNEE ARTHROPLASTY Right 08/07/2011; 2014  . TUBAL LIGATION      Family History  Problem Relation Age of Onset  . Hypertension Mother     Social History   Social History  . Marital status: Widowed    Spouse name: N/A  . Number of children: N/A  . Years of education: N/A   Occupational History  . Not on file.   Social History Main Topics  . Smoking status: Never Smoker  . Smokeless tobacco: Former Neurosurgeon    Types: Snuff  . Alcohol use No  . Drug use: No  . Sexual activity: No   Other Topics Concern  . Not on file   Social History Narrative  . No narrative on file   Review of systems: Review of Systems  Constitutional: Negative for fever and chills.  HENT: Negative.   Eyes: Negative for blurred vision.  Respiratory: as per HPI  Cardiovascular: Negative for chest pain and palpitations.  Gastrointestinal: Negative for vomiting, diarrhea, blood per rectum. Genitourinary: Negative for dysuria, urgency, frequency and hematuria.  Musculoskeletal: Negative for myalgias, back pain and joint pain.  Skin: Negative for itching and rash.  Neurological: Negative for dizziness, tremors, focal weakness, seizures and loss of consciousness.  Endo/Heme/Allergies: Negative for environmental allergies.  Psychiatric/Behavioral: Negative for depression, suicidal ideas and hallucinations.  All other systems reviewed and are negative.  Physical Exam: Blood pressure 116/74. Gen:      No acute distress HEENT:  EOMI, sclera anicteric Neck:     No masses; no thyromegaly Lungs:    Scattered basal crackles; normal respiratory effort. CV:         Regular rate and rhythm; no murmurs Abd:      + bowel sounds; soft, non-tender; no palpable masses, no distension Ext:    No edema; adequate peripheral perfusion Skin:      Warm and dry; no rash Neuro: alert and oriented x 3 Psych: normal mood and affect  Data  Reviewed: CT Chest: 01/24/2016 IMPRESSION: 1. Spectrum of findings suggestive of interstitial lung disease, including prominent patchy basilar-predominant regions of subpleural reticulation, subpleural lines, traction bronchiectasis and possible early honeycombing. Findings were present at the lung bases on 12/16/2015 CT abdomen study, with possible progression of these findings since 12/16/2015. 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 Pulmonology consultation advised.  01/25/2016: Echo: The estimated ejection fraction was in the  range of 25% to 30%. Hypokinesisof the anteroseptal, inferoseptal  and inferior walls.   Assessment:  Follow-up for interstitial lung disease. Chronic cough.  Mrs. Ansara has an incidental finding of UIP fibrosis on CT scan done earlier this year. The fibrosis is likely  secondary to her rheumatoid arthritis and she has no known exposures. She is currently on palliative care for her end-stage heart disease and we will defer an aggressive workup, treatment of her lung fibrosis. The focus of her management is symptom control.  Her chief complaint is cough. She is currently getting Robitussin and Tessalon. She may have acid reflux as her cough appears to be worse at night and on lying down. I'll restart her Pepcid 20 mg before bedtime. She'll continue her albuterol nebulizer. Her daughter wants to know if she needs to be on oxygen all the time. I recommended to her that she can take breaks off the oxygen as long as his sats remained above 90% and she'll have to use oxygen mandatory when she sleeps at night.  Plan/Recommendations: - Continue supplemental O2 - Tussin, tessalon for cough - Start pepcid 20 mg qhs - Albuterol nebs qid. - Morphine for cough, dyspnea.   Chilton Greathouse MD [facility] Pager [phone] If no  answer or after 3pm call: [phone] 05/10/2016, 12:34 PM  CC: Gwenyth Bender, MD

## 2016-05-10 NOTE — Patient Instructions (Signed)
Continue your nebs, cough medications and supplemental O2 We will start you on pepcid at night for acid reflux.  Return to clinic in 3 months.

## 2016-05-12 ENCOUNTER — Ambulatory Visit (INDEPENDENT_AMBULATORY_CARE_PROVIDER_SITE_OTHER): Admitting: Cardiovascular Disease

## 2016-05-12 VITALS — BP 97/63 | HR 75 | Ht 66.0 in | Wt 165.9 lb

## 2016-05-12 DIAGNOSIS — I272 Other secondary pulmonary hypertension: Secondary | ICD-10-CM

## 2016-05-12 DIAGNOSIS — N184 Chronic kidney disease, stage 4 (severe): Secondary | ICD-10-CM

## 2016-05-12 DIAGNOSIS — I340 Nonrheumatic mitral (valve) insufficiency: Secondary | ICD-10-CM

## 2016-05-12 DIAGNOSIS — E1122 Type 2 diabetes mellitus with diabetic chronic kidney disease: Secondary | ICD-10-CM

## 2016-05-12 DIAGNOSIS — I480 Paroxysmal atrial fibrillation: Secondary | ICD-10-CM | POA: Diagnosis not present

## 2016-05-12 DIAGNOSIS — I9589 Other hypotension: Secondary | ICD-10-CM

## 2016-05-12 DIAGNOSIS — I5042 Chronic combined systolic (congestive) and diastolic (congestive) heart failure: Secondary | ICD-10-CM

## 2016-05-12 MED ORDER — METOLAZONE 2.5 MG PO TABS: 2.5000 mg | ORAL_TABLET | ORAL | 11 refills | Status: DC

## 2016-05-12 NOTE — Progress Notes (Signed)
Patient ID: Theresa Cortez, female   DOB: 06-May-1930, 81 y.o.   MRN: [id number]    Cardiology Office Note    Date:  05/13/2016   ID:  Theresa Cortez, DOB [birth year], MRN [id number]  PCP:  Gwenyth Bender, MD  Cardiologist:   Thurmon Fair, MD   Chief Complaint  Patient presents with  . Follow-up    History of Present Illness:  Theresa Cortez is a 82 y.o. female with relatively recent onset severe congestive heart failure, returning for follow-up. Also has advanced chronic kidney disease. She is receiving home hospice care.  When she presented with congestive heart failure she weighed 186 pounds. At the time of hospital discharge she was 167 pounds. When I saw her in March, she only weighed 152 pounds and was hypotensive, albeit asymptomatic. Since then her weight had generally been steady at around the 165 pounds. It seems that she is always orthopneic. Usually she sleeps of the head of the bed elevated at roughly 30-45 degrees. Her daughter will raise the head of the bed further if she starts coughing a lot.  Last night she had a difficult night and was quite anxious mumbling a lot in her sleep. Her daughter gave her some lorazepam around 1 AM. She is still a little sedated.  She has severely depressed left ventricular systolic function with ejection fraction of 25-30 %, no significant coronary artery stenoses by cardiac cath, moderate to severe mitral insufficiency and recurrent paroxysmal atrial flutter and atrial fibrillation.  She is taking amiodarone for atrial fibrillation rate/rhythm control. Anticoagulants were stopped. She remains on oral loop diuretics and we recently started metolazone, but other heart failure medications have been held due to renal insufficiency and hypotension. Indications for diabetes and rheumatoid arthritis have been discontinued. She has opiates and benzodiazepines available for palliation.   Past Medical History:  Diagnosis Date  . Arthritis    "hands, arms"  (11/23/2015)  . Chronic combined systolic and diastolic heart failure (HCC)    a. 01/2016 Echo: EF 25-30%, mild LVH, antsept, infsept, inf HK, mod to sev MR, mod dil LA, mild TR, PASP .  . CKD (chronic kidney disease), stage III   . DJD (degenerative joint disease)   . History of blood transfusion X 2   w/knee replacement  . History of diverticulosis   . Hyperlipidemia   . Hypertensive heart disease   . Hypothyroidism   . Interstitial lung disease (HCC)   . Mitral regurgitation    a. 01/2016 Mod to sev by echo.  Marland Kitchen NICM (nonischemic cardiomyopathy) (HCC)    a. 11/2015 Cath: mild nonobs dzs; b. 01/2016 Echo: EF 25-30%, mild LVH, antsept, infsept, inf HK, mod to sev MR, mod dil LA, mild TR, PASP .  . Paroxysmal atrial fibrillation (HCC)   . Rheumatoid arthritis(714.0)   . Stroke [facility]) 2000   denies residual on 11/23/2015  . Type II diabetes mellitus (HCC)   . Wound infection (HCC)    Right knee     Past Surgical History:  Procedure Laterality Date  . APPENDECTOMY    . CARDIAC CATHETERIZATION N/A 11/25/2015   Procedure: Right/Left Heart Cath and Coronary Angiography;  Surgeon: Iran Ouch, MD;  [facility];  Service: Cardiovascular;  Laterality: N/A;  . CATARACT EXTRACTION W/ INTRAOCULAR LENS IMPLANT Right   . INCISION AND DRAINAGE MOUTH Right 2014   "knee; took replacement out; put  spacers in"  . KNEE ARTHROSCOPY Right   .  MEDIAL PARTIAL KNEE REPLACEMENT Right   . PARTIAL COLECTOMY     descending colon by Dr. Orson Slick  . TOTAL KNEE ARTHROPLASTY Right 08/07/2011; 2014  . TUBAL LIGATION      Outpatient Medications Prior to Visit  Medication Sig Dispense Refill  . albuterol (PROVENTIL) (2.5 MG/3ML) 0.083% nebulizer solution Take 2.5 mg by nebulization every 4 (four) hours as needed for wheezing or shortness of breath.    Marland Kitchen amiodarone (PACERONE) 200 MG tablet Take 1 tablet (200 mg total) by mouth daily. 30 tablet 0  . benzonatate (TESSALON) 100 MG  capsule Take 100 mg by mouth 3 (three) times daily as needed for cough.    . famotidine (PEPCID) 20 MG tablet Take 20 mg by mouth at bedtime.    . furosemide (LASIX) 40 MG tablet Take 3 tablets by mouth every AM and 2 tablets every PM (Patient taking differently: Take 2 tablets by mouth every AM and 2 tablets every PM) 150 tablet 3  . LORazepam (ATIVAN) 0.5 MG tablet Take 0.5 mg by mouth. 1-2 every 4 hours as needed     . Morphine Sulfate (MORPHINE CONCENTRATE) 10 mg / 0.5 ml concentrated solution Take 5-10 mg by mouth every 2 (two) hours as needed for moderate pain or shortness of breath (for coughing spells).   0  . prochlorperazine (COMPAZINE) 10 MG tablet Take 10 mg by mouth every 4 (four) hours as needed.  0  . metolazone (ZAROXOLYN) 2.5 MG tablet Take 1 tablet (2.5 mg total) by mouth once a week. Give 30 minutes before morning furosemide (Lasix) dose. 30 tablet 0   No facility-administered medications prior to visit.      Allergies:   Penicillins   Social History   Social History  . Marital status: Widowed    Spouse name: N/A  . Number of children: N/A  . Years of education: N/A   Social History Main Topics  . Smoking status: Never Smoker  . Smokeless tobacco: Former Neurosurgeon    Types: Snuff  . Alcohol use No  . Drug use: No  . Sexual activity: No   Other Topics Concern  . Not on file   Social History Narrative  . No narrative on file     Family History:  The patient's family history includes Hypertension in her mother.   ROS:   Please see the history of present illness.    ROS All other systems reviewed and are negative.   PHYSICAL EXAM:   VS:  BP 97/63 (BP Location: Left Arm, Patient Position: Sitting, Cuff Size: Normal)   Pulse 75   Ht 5\' 6"  (1.676 m)   Wt 165 lb 14.4 oz (75.3 kg)   BMI 26.78 kg/m    GEN: Well nourished, well developed, in no acute distress, but a little tachypneic (respiratory rate 22-24). She is lightly sedated but appears oriented.  HEENT:  normal  Neck: 8-10 centimeters elevation in jugular venous pulsations, carotid bruits, or masses Cardiac: RRR paradoxically split S2; grade 1/6 holosystolic apical murmur, no diastolic murmurs, rubs, I do not appreciate any gallops, bilateral 2+ pitting pretibial edema edema  Respiratory:  clear to auscultation bilaterally, normal work of breathing GI: soft, nontender, nondistended, + BS MS: no deformity or atrophy  Skin: warm and dry, no rash Neuro:  Oriented x 3, Strength and sensation are intact Psych: euthymic mood, full affect  Wt Readings from Last 3 Encounters:  05/12/16 165 lb 14.4 oz (75.3 kg)  04/07/16 165 lb 8  oz (75.1 kg)  03/30/16 163 lb (73.9 kg)      Studies/Labs Reviewed:   EKG:  EKG is ordered today.  The ekg ordered today demonstrates Sinus rhythm with First-degree AV block and atypical left bundle branch block, QTC 506 ms   Recent Labs: 12/18/2015: TSH 2.600 01/24/2016: B Natriuretic Peptide 2,194.5 01/27/2016: Magnesium 2.3 04/09/2016: ALT 62; BUN 51; Creatinine, Ser 3.05; Hemoglobin 12.0; Platelets 142; Potassium 3.9; Sodium 136     ASSESSMENT:    1. Chronic combined systolic and diastolic congestive heart failure (HCC)   2. PAF (paroxysmal atrial fibrillation) (HCC)   3. Mitral regurgitation   4. Pulmonary hypertension (HCC)   5. Other specified hypotension   6. Type 2 diabetes mellitus with stage 4 chronic kidney disease, unspecified long term insulin use status (HCC)   7. CKD (chronic kidney disease), stage IV (HCC)      PLAN:  In order of problems listed above:  1. CHF: Due to nonischemic cardiomyopathy. Despite being at her usual "dry weight" she is clearly hypervolemic on exam with jugular venous distention and leg edema and mild tachypnea. I don't hear any lung rales. Last night's restlessness may have been caused by orthopnea. I think we have to revise down her "dry weight". Will add another weekly dose of metolazone and touch base with her  daughter Westley Hummer early next week to see if we need to increase this further. For now she will administer metolazone on Saturdays and Tuesdays.  2. AFib: Currently in sinus rhythm on maintenance dose amiodarone. Embolic risk is high, but the focus is now on symptom palliation.Marland Kitchen CHADVasc 5 (age 57, HF, HTN, DM). Off anticoagulants. 3. MR: The fact that the murmur seems less prominent after aggressive diuresis suggested this may be secondary/functional MR rather than the cause of her cardiomyopathy.  4. PAH: Secondary to left heart failure 5. HTN: In the past hypertensive, currently hypotensive without any room for heart failure therapies other than diuresis. 6. DM: Off medications 7. CKD: Age and poor functional status make her a poor candidate for chronic hemodialysis.  Medication Adjustments/Labs and Tests Ordered: Current medicines are reviewed at length with the patient today.  Concerns regarding medicines are outlined above.  Medication changes, Labs and Tests ordered today are listed in the Patient Instructions below. Patient Instructions  Dr Royann Shivers has recommended making the following medication changes: 1. TAKE Metolazone on Saturdays and Tuesdays  Your target dry weight is 160 pounds.  Dr Royann Shivers recommends that you schedule a follow-up appointment in 3 months.  If you need a refill on your cardiac medications before your next appointment, please call your pharmacy.      Signed, Thurmon Fair, MD  05/13/2016 11:07 AM    [facility] [address] [place], [place], [place]  [address] Phone: [phone]; Fax: [phone]

## 2016-05-12 NOTE — Patient Instructions (Signed)
Dr Royann Shivers has recommended making the following medication changes: 1. TAKE Metolazone on Saturdays and Tuesdays  Your target dry weight is 160 pounds.  Dr Royann Shivers recommends that you schedule a follow-up appointment in 3 months.  If you need a refill on your cardiac medications before your next appointment, please call your pharmacy.

## 2016-05-13 ENCOUNTER — Encounter: Payer: Self-pay | Admitting: Cardiovascular Disease

## 2016-05-19 ENCOUNTER — Telehealth: Payer: Self-pay | Admitting: Cardiovascular Disease

## 2016-05-19 NOTE — Telephone Encounter (Signed)
Recommendations communicated in detail to [facility]. She verbalized understanding and thanks for call. Aware of change to med administration schedule and to call if no discernable improvement in ~1 month.

## 2016-05-19 NOTE — Telephone Encounter (Signed)
Spoke to [place], daughter, who reports patient has had some problems since starting the amiodarone ~1 month ago, and she thinks these are SEs of the medication.  She notes pt has had tremors/shaking in arms, which began in one arm and now persists in both. She also notes pt has had sleeplessness/restlessness at night (noting she naps some during the day without issue) Not reporting any other significant concerns at this time.  Caller aware I will route to provider for recommendations.

## 2016-05-19 NOTE — Telephone Encounter (Signed)
Theresa Cortez is probably right and these are indeed side effects of the medication. Unfortunately there is no good alternative and that medicine is what is keeping her in normal rhythm, hopefully keeping her out of congestive heart failure. Obviously, quality of life is her biggest priority. Let's try cutting it down to 1 tablet 3 days a week (Monday Wednesday and Friday), and wait for a few weeks. If the symptoms are severe and interfere with her quality of life, will stop it altogether.

## 2016-05-19 NOTE — Telephone Encounter (Signed)
I have left msg for Theresa Cortez w instructions and to call if questions.

## 2016-05-19 NOTE — Telephone Encounter (Signed)
Pt c/o medication issue:  1. Name of Medication: Amiodarone hcl 200mg   2. How are you currently taking this medication (dosage and times per day)? 1x day  3. Are you having a reaction (difficulty breathing--STAT)? no  4. What is your medication issue? It makes her shake really bad in her arms and she cant sleep

## 2016-05-20 ENCOUNTER — Telehealth: Payer: Self-pay | Admitting: Cardiology

## 2016-05-20 NOTE — Telephone Encounter (Signed)
Family called. Pt is anxious. They assume secondary to Amiodarone (see Dr Croitoru's note 05/19/16). They wanted to know if there was anything she could take. I suggested the try either Benadryl 25 mg or Ativan 0.5 mg (she has an Rx for this).   Corine Shelter PA-C 05/20/2016 2:52 PM

## 2016-05-21 NOTE — Telephone Encounter (Signed)
Family called. Pt is anxious. They assume secondary to Amiodarone (see Dr Croitoru's note 05/19/16). They wanted to know if there was anything she could take. I suggested the try either Benadryl 25 mg or Ativan 0.5 mg (she has an Rx for this).   Rashanda Magloire PA-C 05/20/2016 2:52 PM   

## 2016-05-22 ENCOUNTER — Telehealth: Payer: Self-pay | Admitting: *Deleted

## 2016-05-22 MED ORDER — AMIODARONE HCL 200 MG PO TABS: 200.0000 mg | ORAL_TABLET | ORAL | 1 refills | Status: DC

## 2016-05-22 NOTE — Telephone Encounter (Signed)
Amlodipine refilled at daughter's request. No further concerns/needs.

## 2016-05-23 ENCOUNTER — Emergency Department (HOSPITAL_COMMUNITY)

## 2016-05-23 ENCOUNTER — Inpatient Hospital Stay (HOSPITAL_COMMUNITY)
Admission: EM | Admit: 2016-05-23 | Discharge: 2016-05-25 | DRG: 291 | Disposition: A | Discharge status: Hospice | Attending: Student in an Organized Health Care Education/Training Program | Admitting: Student in an Organized Health Care Education/Training Program

## 2016-05-23 ENCOUNTER — Encounter (HOSPITAL_COMMUNITY): Payer: Self-pay | Admitting: Emergency Medicine

## 2016-05-23 DIAGNOSIS — Z961 Presence of intraocular lens: Secondary | ICD-10-CM | POA: Diagnosis present

## 2016-05-23 DIAGNOSIS — I447 Left bundle-branch block, unspecified: Secondary | ICD-10-CM | POA: Diagnosis present

## 2016-05-23 DIAGNOSIS — R451 Restlessness and agitation: Secondary | ICD-10-CM | POA: Diagnosis present

## 2016-05-23 DIAGNOSIS — J849 Interstitial pulmonary disease, unspecified: Secondary | ICD-10-CM | POA: Diagnosis present

## 2016-05-23 DIAGNOSIS — M069 Rheumatoid arthritis, unspecified: Secondary | ICD-10-CM | POA: Diagnosis present

## 2016-05-23 DIAGNOSIS — E785 Hyperlipidemia, unspecified: Secondary | ICD-10-CM | POA: Diagnosis present

## 2016-05-23 DIAGNOSIS — I480 Paroxysmal atrial fibrillation: Secondary | ICD-10-CM | POA: Diagnosis present

## 2016-05-23 DIAGNOSIS — I272 Other secondary pulmonary hypertension: Secondary | ICD-10-CM | POA: Diagnosis present

## 2016-05-23 DIAGNOSIS — R740 Nonspecific elevation of levels of transaminase and lactic acid dehydrogenase [LDH]: Secondary | ICD-10-CM | POA: Diagnosis present

## 2016-05-23 DIAGNOSIS — I132 Hypertensive heart and chronic kidney disease with heart failure and with stage 5 chronic kidney disease, or end stage renal disease: Secondary | ICD-10-CM | POA: Diagnosis not present

## 2016-05-23 DIAGNOSIS — I429 Cardiomyopathy, unspecified: Secondary | ICD-10-CM | POA: Diagnosis present

## 2016-05-23 DIAGNOSIS — I5042 Chronic combined systolic (congestive) and diastolic (congestive) heart failure: Secondary | ICD-10-CM | POA: Diagnosis present

## 2016-05-23 DIAGNOSIS — I119 Hypertensive heart disease without heart failure: Secondary | ICD-10-CM | POA: Diagnosis present

## 2016-05-23 DIAGNOSIS — N179 Acute kidney failure, unspecified: Secondary | ICD-10-CM | POA: Diagnosis present

## 2016-05-23 DIAGNOSIS — E872 Acidosis: Secondary | ICD-10-CM | POA: Diagnosis present

## 2016-05-23 DIAGNOSIS — E1122 Type 2 diabetes mellitus with diabetic chronic kidney disease: Secondary | ICD-10-CM | POA: Diagnosis present

## 2016-05-23 DIAGNOSIS — F419 Anxiety disorder, unspecified: Secondary | ICD-10-CM | POA: Diagnosis present

## 2016-05-23 DIAGNOSIS — Z66 Do not resuscitate: Secondary | ICD-10-CM | POA: Diagnosis present

## 2016-05-23 DIAGNOSIS — E039 Hypothyroidism, unspecified: Secondary | ICD-10-CM | POA: Diagnosis present

## 2016-05-23 DIAGNOSIS — Z8673 Personal history of transient ischemic attack (TIA), and cerebral infarction without residual deficits: Secondary | ICD-10-CM

## 2016-05-23 DIAGNOSIS — Z880 Allergy status to penicillin: Secondary | ICD-10-CM

## 2016-05-23 DIAGNOSIS — J84112 Idiopathic pulmonary fibrosis: Secondary | ICD-10-CM | POA: Diagnosis present

## 2016-05-23 DIAGNOSIS — I130 Hypertensive heart and chronic kidney disease with heart failure and stage 1 through stage 4 chronic kidney disease, or unspecified chronic kidney disease: Secondary | ICD-10-CM | POA: Diagnosis not present

## 2016-05-23 DIAGNOSIS — Z96651 Presence of right artificial knee joint: Secondary | ICD-10-CM | POA: Diagnosis present

## 2016-05-23 DIAGNOSIS — N185 Chronic kidney disease, stage 5: Secondary | ICD-10-CM | POA: Diagnosis present

## 2016-05-23 DIAGNOSIS — Z9981 Dependence on supplemental oxygen: Secondary | ICD-10-CM

## 2016-05-23 DIAGNOSIS — A419 Sepsis, unspecified organism: Secondary | ICD-10-CM

## 2016-05-23 DIAGNOSIS — Z515 Encounter for palliative care: Secondary | ICD-10-CM | POA: Diagnosis present

## 2016-05-23 DIAGNOSIS — I081 Rheumatic disorders of both mitral and tricuspid valves: Secondary | ICD-10-CM | POA: Diagnosis present

## 2016-05-23 DIAGNOSIS — I5023 Acute on chronic systolic (congestive) heart failure: Secondary | ICD-10-CM | POA: Diagnosis present

## 2016-05-23 DIAGNOSIS — N184 Chronic kidney disease, stage 4 (severe): Secondary | ICD-10-CM | POA: Diagnosis not present

## 2016-05-23 DIAGNOSIS — I2510 Atherosclerotic heart disease of native coronary artery without angina pectoris: Secondary | ICD-10-CM | POA: Diagnosis present

## 2016-05-23 DIAGNOSIS — R109 Unspecified abdominal pain: Secondary | ICD-10-CM

## 2016-05-23 DIAGNOSIS — I509 Heart failure, unspecified: Secondary | ICD-10-CM | POA: Diagnosis not present

## 2016-05-23 DIAGNOSIS — R570 Cardiogenic shock: Secondary | ICD-10-CM | POA: Diagnosis not present

## 2016-05-23 DIAGNOSIS — I5043 Acute on chronic combined systolic (congestive) and diastolic (congestive) heart failure: Secondary | ICD-10-CM | POA: Diagnosis not present

## 2016-05-23 DIAGNOSIS — Z87891 Personal history of nicotine dependence: Secondary | ICD-10-CM

## 2016-05-23 DIAGNOSIS — Z8249 Family history of ischemic heart disease and other diseases of the circulatory system: Secondary | ICD-10-CM

## 2016-05-23 DIAGNOSIS — Z79899 Other long term (current) drug therapy: Secondary | ICD-10-CM

## 2016-05-23 DIAGNOSIS — E119 Type 2 diabetes mellitus without complications: Secondary | ICD-10-CM

## 2016-05-23 LAB — CBC
HEMATOCRIT: 38.2 % (ref 36.0–46.0)
HEMOGLOBIN: 12.5 g/dL (ref 12.0–15.0)
MCH: 33.2 pg (ref 26.0–34.0)
MCHC: 32.7 g/dL (ref 30.0–36.0)
MCV: 101.6 fL — ABNORMAL HIGH (ref 78.0–100.0)
Platelets: 137 10*3/uL — ABNORMAL LOW (ref 150–400)
RBC: 3.76 MIL/uL — AB (ref 3.87–5.11)
RDW: 21.2 % — ABNORMAL HIGH (ref 11.5–15.5)
WBC: 5.6 10*3/uL (ref 4.0–10.5)

## 2016-05-23 LAB — I-STAT CG4 LACTIC ACID, ED
LACTIC ACID, VENOUS: 5.8 mmol/L — AB (ref 0.5–1.9)
Lactic Acid, Venous: 5.56 mmol/L (ref 0.5–1.9)

## 2016-05-23 LAB — COMPREHENSIVE METABOLIC PANEL
ALT: 69 U/L — ABNORMAL HIGH (ref 14–54)
ANION GAP: 17 — AB (ref 5–15)
AST: 73 U/L — ABNORMAL HIGH (ref 15–41)
Albumin: 2.9 g/dL — ABNORMAL LOW (ref 3.5–5.0)
Alkaline Phosphatase: 120 U/L (ref 38–126)
BILIRUBIN TOTAL: 4.4 mg/dL — AB (ref 0.3–1.2)
BUN: 60 mg/dL — ABNORMAL HIGH (ref 6–20)
CO2: 30 mmol/L (ref 22–32)
Calcium: 10.3 mg/dL (ref 8.9–10.3)
Chloride: 92 mmol/L — ABNORMAL LOW (ref 101–111)
Creatinine, Ser: 3.79 mg/dL — ABNORMAL HIGH (ref 0.44–1.00)
GFR calc Af Amer: 12 mL/min — ABNORMAL LOW (ref >60)
GFR, EST NON AFRICAN AMERICAN: 10 mL/min — AB (ref >60)
Glucose, Bld: 78 mg/dL (ref 65–99)
POTASSIUM: 3.7 mmol/L (ref 3.5–5.1)
Sodium: 139 mmol/L (ref 135–145)
TOTAL PROTEIN: 7.2 g/dL (ref 6.5–8.1)

## 2016-05-23 LAB — URINALYSIS, ROUTINE W REFLEX MICROSCOPIC
Bilirubin Urine: NEGATIVE
Glucose, UA: NEGATIVE mg/dL
Hgb urine dipstick: NEGATIVE
KETONES UR: NEGATIVE mg/dL
LEUKOCYTES UA: NEGATIVE
NITRITE: NEGATIVE
PH: 6 (ref 5.0–8.0)
Protein, ur: NEGATIVE mg/dL

## 2016-05-23 LAB — TROPONIN I: Troponin I: <0.03 ng/mL (ref <0.03)

## 2016-05-23 LAB — GLUCOSE, CAPILLARY: GLUCOSE-CAPILLARY: 111 mg/dL — AB (ref 65–99)

## 2016-05-23 LAB — BRAIN NATRIURETIC PEPTIDE: B Natriuretic Peptide: 3001.9 pg/mL — ABNORMAL HIGH (ref 0.0–100.0)

## 2016-05-23 LAB — LIPASE, BLOOD: LIPASE: 32 U/L (ref 11–51)

## 2016-05-23 LAB — MRSA PCR SCREENING: MRSA BY PCR: NEGATIVE

## 2016-05-23 MED ORDER — FUROSEMIDE 10 MG/ML IJ SOLN
80.0000 mg | Freq: Two times a day (BID) | INTRAMUSCULAR | Status: DC
  Administered 2016-05-23: 80 mg via INTRAVENOUS
  Filled 2016-05-23: qty 8

## 2016-05-23 MED ORDER — SODIUM CHLORIDE 0.9 % IV BOLUS (SEPSIS)
1000.0000 mL | Freq: Once | INTRAVENOUS | Status: AC
Start: 2016-05-23 — End: 2016-05-23
  Administered 2016-05-23: 1000 mL via INTRAVENOUS

## 2016-05-23 MED ORDER — DEXTROSE 5 % IV SOLN
2.0000 g | Freq: Once | INTRAVENOUS | Status: DC
  Filled 2016-05-23: qty 2

## 2016-05-23 MED ORDER — METOLAZONE 2.5 MG PO TABS
2.5000 mg | ORAL_TABLET | ORAL | Status: DC
  Administered 2016-05-23: 2.5 mg via ORAL
  Filled 2016-05-23 (×2): qty 1

## 2016-05-23 MED ORDER — FUROSEMIDE 20 MG PO TABS: 80.0000 mg | ORAL_TABLET | Freq: Two times a day (BID) | ORAL | Status: DC

## 2016-05-23 MED ORDER — DEXTROSE 5 % IV SOLN
1.0000 g | Freq: Three times a day (TID) | INTRAVENOUS | Status: DC
  Filled 2016-05-23 (×2): qty 1

## 2016-05-23 MED ORDER — SODIUM CHLORIDE 0.9 % IV BOLUS (SEPSIS)
1000.0000 mL | Freq: Once | INTRAVENOUS | Status: AC
  Administered 2016-05-23: 1000 mL via INTRAVENOUS

## 2016-05-23 MED ORDER — HEPARIN SODIUM (PORCINE) 5000 UNIT/ML IJ SOLN
5000.0000 [IU] | Freq: Three times a day (TID) | INTRAMUSCULAR | Status: DC
  Administered 2016-05-23 – 2016-05-24 (×3): 5000 [IU] via SUBCUTANEOUS
  Filled 2016-05-23 (×3): qty 1

## 2016-05-23 MED ORDER — ALBUTEROL SULFATE (2.5 MG/3ML) 0.083% IN NEBU: 2.5000 mg | INHALATION_SOLUTION | RESPIRATORY_TRACT | Status: DC | PRN

## 2016-05-23 MED ORDER — SODIUM CHLORIDE 0.9 % IV BOLUS (SEPSIS)
250.0000 mL | Freq: Once | INTRAVENOUS | Status: AC
  Administered 2016-05-23: 250 mL via INTRAVENOUS

## 2016-05-23 MED ORDER — METOLAZONE 2.5 MG PO TABS
2.5000 mg | ORAL_TABLET | ORAL | Status: DC
Start: 2016-05-25 — End: 2016-05-23

## 2016-05-23 MED ORDER — POTASSIUM CHLORIDE CRYS ER 20 MEQ PO TBCR
40.0000 meq | EXTENDED_RELEASE_TABLET | Freq: Two times a day (BID) | ORAL | Status: DC
Start: 2016-05-23 — End: 2016-05-24
  Administered 2016-05-23 (×2): 40 meq via ORAL
  Filled 2016-05-23 (×3): qty 2

## 2016-05-23 MED ORDER — DEXTROSE 5 % IV SOLN
2.0000 g | Freq: Once | INTRAVENOUS | Status: AC
  Administered 2016-05-23: 2 g via INTRAVENOUS

## 2016-05-23 MED ORDER — VANCOMYCIN HCL IN DEXTROSE 1-5 GM/200ML-% IV SOLN
1000.0000 mg | Freq: Once | INTRAVENOUS | Status: AC
  Administered 2016-05-23: 1000 mg via INTRAVENOUS
  Filled 2016-05-23: qty 200

## 2016-05-23 MED ORDER — SODIUM CHLORIDE 0.9% FLUSH
3.0000 mL | Freq: Two times a day (BID) | INTRAVENOUS | Status: DC
  Administered 2016-05-23 – 2016-05-24 (×3): 3 mL via INTRAVENOUS

## 2016-05-23 MED ORDER — ONDANSETRON HCL 4 MG/2ML IJ SOLN
4.0000 mg | Freq: Four times a day (QID) | INTRAMUSCULAR | Status: DC | PRN
  Administered 2016-05-24: 4 mg via INTRAVENOUS
  Filled 2016-05-23: qty 2

## 2016-05-23 MED ORDER — LEVOFLOXACIN IN D5W 750 MG/150ML IV SOLN
750.0000 mg | Freq: Once | INTRAVENOUS | Status: AC
  Administered 2016-05-23: 750 mg via INTRAVENOUS
  Filled 2016-05-23: qty 150

## 2016-05-23 MED ORDER — ONDANSETRON HCL 4 MG PO TABS: 4.0000 mg | ORAL_TABLET | Freq: Four times a day (QID) | ORAL | Status: DC | PRN

## 2016-05-23 MED ORDER — VANCOMYCIN HCL IN DEXTROSE 1-5 GM/200ML-% IV SOLN: 1000.0000 mg | INTRAVENOUS | Status: DC

## 2016-05-23 MED ORDER — AMIODARONE HCL 200 MG PO TABS: 200.0000 mg | ORAL_TABLET | ORAL | Status: DC

## 2016-05-23 MED ORDER — FAMOTIDINE 20 MG PO TABS
20.0000 mg | ORAL_TABLET | Freq: Every day | ORAL | Status: DC
  Administered 2016-05-23: 20 mg via ORAL
  Filled 2016-05-23 (×2): qty 1

## 2016-05-23 MED ORDER — LEVOFLOXACIN IN D5W 500 MG/100ML IV SOLN: 500.0000 mg | INTRAVENOUS | Status: DC

## 2016-05-23 NOTE — ED Notes (Signed)
ADmitting at bedside. Verbal order to D/c fluids

## 2016-05-23 NOTE — ED Notes (Signed)
RN unable to obtain blood at this time. Phlebotomy contacted about lactic acid missing.

## 2016-05-23 NOTE — ED Triage Notes (Signed)
Pt from home with c/o left upper back pain near her scapula x 2 weeks and epigastric pain x 2 weeks with nausea. Pt denies V/D.  NAD, A&O.

## 2016-05-23 NOTE — H&P (Signed)
Date: 05/23/2016               Patient Name:  Theresa Cortez MRN: [id number]  DOB: 10/26/1929 Age / Sex: 82 y.o., female   PCP: Gwenyth Bender, MD         Medical Service: Internal Medicine Teaching Service         Attending Physician: Dr. Tyson Alias, MD    First Contact: Wylene Men, MS IV Pager: [phone]  Second Contact: Dr. Lawerance Bach Pager: [phone]       After Hours (After 5p/  First Contact Pager: [phone]  weekends / holidays): Second Contact Pager: [phone]   Chief Complaint: Abdominal Pain  History of Present Illness: Theresa Cortez is an 82 yo female with chronic HFrEF EF 25-30% in May 2017 on metolazone and lasix at home, moderate to severe mitral valve regurgitation, moderate pulmonary HTN, CAD (LHC and RHC in March 2017), interstitial lung disease, atrial fibrillation, HTN, T2DM diet controlled, and CKD IV presenting with generalized abdominal discomfort.   Patient is accompanied by Theresa Cortez who helps with the history. Patient presented to the ED wanting to get "everything checked out." She complains of generalized stomach uneasiness and discomfort. It is constant and unassociated with food intake but associated with nausea and occasional vomiting for the last 2 weeks. Patient denies diarrhea. She denies dysuria, but admits to increased frequency. She admits to non-productive cough which is chronic. She denies fever, chills or diaphoresis.   Patient denies increased shortness of breath, orthopnea, weight gain. Weights have been stable at home per family. Patient takes amiodarone 200 mg every MWF. She takes benadryl with this medication to help with side effects. She recently had Theresa metolazone increased to biweekly last week and amiodarone increased from daily to three times per week this week. Patient was recently admitted in May 2017 with low output heart failure requiring milrinone. Given Theresa multiple co-morbidities, patient was discharged to [facility], but after 2  weeks discharged to home with home hospice. Patient remains on home hospice.   In the ED, T 96.6, BP 100/76, HR 112, satting 97% on 2 L oxygen. No leukocytosis, 5.6. Creatinine elevated at 3.7 (baseline 2.7). BNP 3,000. Troponin negative. Lactic acid 5.56>5.8. AST and ALT chronically elevated. T. Bili elevated higher than normal at 4.4. CXR showed left pleural effusion and stable cardiomegaly. BCx pending. UA without signs of infection and UCx pending. CT abdomen/pelvis showed moderate free fluid in the abdomen and pelvis, slightly increased in the upper abdomen since prior study, small left pleural effusion. Slight stranding around the pancreas is similar to prior study and may be related to third spacing of fluids. Given Theresa lactic acidosis and T 96.6, the ED was concerned for sepsis of unknown etiology and was started on broad spectrum antibiotics with aztreonam, levaquin, vancomycin and IVF per sepsis protocol (patient received 2L bolus followed a 250 cc bolus which was discontinued given our concern for low output heart failure).   Meds: Current Facility-Administered Medications  Medication Dose Route Frequency Provider Last Rate Last Dose  . albuterol (PROVENTIL) (2.5 MG/3ML) 0.083% nebulizer solution 2.5 mg  2.5 mg Nebulization Q4H PRN Servando Snare, MD      . Melene Muller ON 05/24/2016] amiodarone (PACERONE) tablet 200 mg  200 mg Oral Once per day on Mon Wed Fri Servando Snare, MD      . aztreonam (AZACTAM) 1 g in dextrose 5 % 50 mL IVPB  1 g Intravenous  Q8H Canary Brim Tegeler, MD      . famotidine (PEPCID) tablet 20 mg  20 mg Oral QHS Alexa R Burns, MD      . furosemide (LASIX) injection 80 mg  80 mg Intravenous BID Servando Snare, MD      . Melene Muller ON 05/25/2016] levofloxacin (LEVAQUIN) IVPB 500 mg  500 mg Intravenous Q48H Marquita Palms, RPH      . metolazone (ZAROXOLYN) tablet 2.5 mg  2.5 mg Oral Once per day on Mon Thu Servando Snare, MD      . Melene Muller ON 05/25/2016] vancomycin (VANCOCIN) IVPB 1000  mg/200 mL premix  1,000 mg Intravenous Q48H Marquita Palms, [facility]        Allergies: Allergies as of 05/23/2016 - Review Complete 05/23/2016  Allergen Reaction Noted  . Penicillins Swelling 10/11/2012   Past Medical History:  Diagnosis Date  . Arthritis    "hands, arms" (11/23/2015)  . Chronic combined systolic and diastolic heart failure (HCC)    a. 01/2016 Echo: EF 25-30%, mild LVH, antsept, infsept, inf HK, mod to sev MR, mod dil LA, mild TR, PASP .  . CKD (chronic kidney disease), stage III   . DJD (degenerative joint disease)   . History of blood transfusion X 2   w/knee replacement  . History of diverticulosis   . Hyperlipidemia   . Hypertensive heart disease   . Hypothyroidism   . Interstitial lung disease (HCC)   . Mitral regurgitation    a. 01/2016 Mod to sev by echo.  Marland Kitchen NICM (nonischemic cardiomyopathy) (HCC)    a. 11/2015 Cath: mild nonobs dzs; b. 01/2016 Echo: EF 25-30%, mild LVH, antsept, infsept, inf HK, mod to sev MR, mod dil LA, mild TR, PASP .  . Paroxysmal atrial fibrillation (HCC)   . Rheumatoid arthritis(714.0)   . Stroke [facility]) 2000   denies residual on 11/23/2015  . Type II diabetes mellitus (HCC)   . Wound infection (HCC)    Right knee     Family History:  Mother: Heart Disease  Social History:  Tobacco Use: Denies Alcohol Use: Denies Illicit Drug Use: Denies  Review of Systems: A complete ROS was negative except as per HPI.   Physical Exam: Blood pressure 93/71, pulse 79, temperature (!) 96.9 F (36.1 C), temperature source Rectal, resp. rate 16, SpO2 100 %. General: Vital signs reviewed.  Patient is chronically ill appearing, non-toxic, in no acute distress and cooperative with exam.  Neck: Supple, trachea midline, difficult to assess JVD.  Cardiovascular: RRR, 3/6 systolic murmur. Pulmonary/Chest: Fine inspiratory crackles in lower lung fields bilaterally left > right, no wheezes or coarse crackles. Abdominal: Soft, non-tender,  non-distended, BS +, no masses, organomegaly, or guarding present. No Murphy's sign. Extremities: Bilateral hands and bilateral feet are cold. Bilateral arms and legs are warm. 1+  lower extremity edema bilaterally Neurological: Awake & alert.  Psychiatric: Normal mood and affect. speech and behavior is normal. Cognition and memory are abnormal.   EKG: NSR, LBBB, HR 81.  CXR: left pleural effusion and stable cardiomegaly.    CT abdomen/pelvis showed moderate free fluid in the abdomen and pelvis, slightly increased in the upper abdomen since prior study, small left pleural effusion. Slight stranding around the pancreas is similar to prior study and may be related to third spacing of fluids.   Assessment & Plan by Problem: Principal Problem:   Low output heart failure (HCC) Active Problems:   DM type 2 (diabetes mellitus, type 2) (HCC)  HLD (hyperlipidemia)   Hypothyroidism   Chronic combined systolic and diastolic congestive heart failure (HCC)   Coronary artery disease   Chronic renal insufficiency, stage IV (severe)   Interstitial lung disease (HCC)   Paroxysmal atrial fibrillation (HCC)   Hypertensive heart disease  Theresa Cortez is an 82 yo female with chronic HFrEF EF 25-30% in May 2017 on metolazone and lasix at home, moderate to severe mitral valve regurgitation, moderate pulmonary HTN, CAD (LHC and RHC in March 2017), atrial fibrillation, HTN, T2DM diet controlled, and CKD IV presenting with abdominal pain.   Concern for Low-Output Congestive Heart Failure: Patient presents with a vague complaint of generalized abdominal discomfort which may be secondary to abdominal wall edema. Patient was recently treated for low output heart failure in May 2017. I am concerned for recurrence due to Theresa elevated creatinine, lactic acidosis, elevated LFTs and T. Bili and cold hands and feet. BNP is 3000. However, Theresa weight has been stable at home and she has been compliant with Theresa home metolazone  and Lasix 80 mg IV BID regimen. We have consulted heart failure their expertise. She will be transferred to stepdown. In the meantime, I have stopped Theresa IVF and started Lasix 80 mg IV BID in addition to Theresa metolazone twice weekly.  -Admit to SDU -HF following, appreciate recommendations -Lasix 80 mg IV BID -Continue Metolazone Tuesday and Saturday -Daily weights -Strict I/Os -Daily BMETs  Lactic Acidosis: There was concern in the ED for sepsis due to patient T 96.6 and lactic acid of 5.56>5.8. However, patient has no leukocytosis and no clear source of infection. AST and ALT chronically elevated but  T. Bili elevated higher than normal at 4.4 this could be secondary to low output heart failure as above. CXR, UA, and CT abdomen/pelvis without sign of infection. No wounds or cellulitis on exam. ED started patient on broad spectrum antibiotics for sepsis of unknown etiology IVF per sepsis protocol (patient received 2L bolus followed a 250 cc bolus which was discontinued given our concern for low output heart failure). If clinical picture becomes more consistent with infection, would consider RUQ [place]. -Discontinue antibiotics, follow clinically -UCx and BCx pending -Stop IVF -Trend lactic acid  Acute on CKD IV:  Creatinine elevated at 3.7 (baseline 2.7), likely secondary to low output heart failure versus recent increase in metolazone. -Daily BMET -Avoid nephrotoxic medications  Moderate to Severe Mitral Valve Regurgitation: 3/6 systolic murmur on examination. Not deemed a surgical candidate on previous hospitalization.  Moderate Pulmonary HTN: Secondary to heart failure.  CAD: LHC and RHC in March 2017 showed mild to moderate 3 vessel disease.   Paroxysmal Atrial Fibrillation: Currently in sinus rhythm, rate controlled with amiodarone. CHADVasc 5 ( for age 77, HF, HTN, DM). Not on anticoagulants. -Amiodarone 200 mg three times weekly  HTN: Low normal. Only on amiodarone, lasix and metolazone  at home.  -Monitor, continue the above  T2DM Diet Controlled: 7.3 in March 2017.   DVT/PE ppx: Heparin FEN: HH CODE: DNR  Dispo: Admit patient to Inpatient with expected length of stay greater than 2 midnights.  Signed: Karlene Lineman, DO PGY-3 Internal Medicine Resident Pager # [phone] 05/23/2016 2:52 PM

## 2016-05-23 NOTE — ED Provider Notes (Signed)
[facility]-EMERGENCY DEPT Provider Note   CSN: [id number] Arrival date & time: 05/23/16  0759     History   Chief Complaint Chief Complaint  Patient presents with  . Back Pain  . Abdominal Pain    HPI Theresa Cortez is a 82 y.o. female with a past medical history significant for hypertension, hyperlipidemia, CHF, atrial fibrillation, diverticulosis history, rheumatoid arthritis who presents with abdominal pain, cough, left back pain, nausea, vomiting, and fatigue. Patient reports that for the last week she has been having worsening symptoms that prompted her to seek evaluation this morning. The abdominal pain worsened through although it is slightly improved. Patient reports no rectal bleeding, constipation, or diarrhea. Patient denies changes in urine. Patient reports having chills as well as nonproductive cough. Pt does report recent increase in Metolazone use for diuresis with lasix.    The history is provided by the patient and a relative (daughter).  Shortness of Breath  This is a new problem. The average episode lasts 1 week. The problem occurs continuously.Associated symptoms include cough, vomiting and abdominal pain. Pertinent negatives include no fever, no headaches, no rhinorrhea, no neck pain, no sputum production, no wheezing, no chest pain and no leg pain. Associated medical issues include heart failure.  Abdominal Pain   This is a new problem. The current episode started 1 to 2 hours ago. The problem occurs rarely. The problem has been gradually improving. The pain is located in the generalized abdominal region. The pain is severe. Associated symptoms include nausea and vomiting. Pertinent negatives include fever, diarrhea, hematochezia, melena, constipation, dysuria, frequency, hematuria and headaches. The symptoms are aggravated by palpation. Nothing relieves the symptoms. Past workup includes CT scan and surgery (hx of diverticulitis).       Past Medical History:  Diagnosis  Date  . Arthritis    "hands, arms" (11/23/2015)  . Chronic combined systolic and diastolic heart failure (HCC)    a. 01/2016 Echo: EF 25-30%, mild LVH, antsept, infsept, inf HK, mod to sev MR, mod dil LA, mild TR, PASP .  . CKD (chronic kidney disease), stage III   . DJD (degenerative joint disease)   . History of blood transfusion X 2   w/knee replacement  . History of diverticulosis   . Hyperlipidemia   . Hypertensive heart disease   . Hypothyroidism   . Interstitial lung disease (HCC)   . Mitral regurgitation    a. 01/2016 Mod to sev by echo.  Marland Kitchen NICM (nonischemic cardiomyopathy) (HCC)    a. 11/2015 Cath: mild nonobs dzs; b. 01/2016 Echo: EF 25-30%, mild LVH, antsept, infsept, inf HK, mod to sev MR, mod dil LA, mild TR, PASP .  . Paroxysmal atrial fibrillation (HCC)   . Rheumatoid arthritis(714.0)   . Stroke [facility]) 2000   denies residual on 11/23/2015  . Type II diabetes mellitus (HCC)   . Wound infection (HCC)    Right knee     Patient Active Problem List   Diagnosis Date Noted  . Dyspnea 03/30/2016  . NICM (nonischemic cardiomyopathy) (HCC)   . Paroxysmal atrial fibrillation (HCC)   . CKD (chronic kidney disease), stage IV (HCC)   . Hypertensive heart disease   . PAF (paroxysmal atrial fibrillation) (HCC)   . Encounter for palliative care   . Goals of care, counseling/discussion   . AKI (acute kidney injury) (HCC)   . Acute on chronic systolic (congestive) heart failure (HCC)   . Interstitial lung disease (HCC)   . Cardiorenal disease 01/13/2016  .  Nonischemic cardiomyopathy (HCC) 12/21/2015  . Coronary artery disease 12/21/2015  . Chronic renal insufficiency, stage IV (severe) 12/21/2015  . Fluid overload 12/20/2015  . Chronic combined systolic and diastolic congestive heart failure (HCC) 12/20/2015  . Adnexal cyst - left 12/16/2015  . Protein-calorie malnutrition, moderate (HCC) 12/16/2015  . HLD (hyperlipidemia) 12/15/2015  . Hypothyroidism 12/15/2015  .  RA (rheumatoid arthritis) (HCC) 12/15/2015  . Hyponatremia 12/15/2015  . Hypotension 12/15/2015  . Arterial hypotension   . Positive D dimer   . Mitral regurgitation   . Tricuspid regurgitation   . Pulmonary hypertension (HCC)   . DM type 2 (diabetes mellitus, type 2) (HCC) 10/11/2012    Past Surgical History:  Procedure Laterality Date  . APPENDECTOMY    . CARDIAC CATHETERIZATION N/A 11/25/2015   Procedure: Right/Left Heart Cath and Coronary Angiography;  Surgeon: Iran Ouch, MD;  Location: [facility] INVASIVE CV LAB;  Service: Cardiovascular;  Laterality: N/A;  . CATARACT EXTRACTION W/ INTRAOCULAR LENS IMPLANT Right   . INCISION AND DRAINAGE MOUTH Right 2014   "knee; took replacement out; put  spacers in"  . KNEE ARTHROSCOPY Right   . MEDIAL PARTIAL KNEE REPLACEMENT Right   . PARTIAL COLECTOMY     descending colon by Dr. Orson Slick  . TOTAL KNEE ARTHROPLASTY Right 08/07/2011; 2014  . TUBAL LIGATION      OB History    No data available       Home Medications    Prior to Admission medications   Medication Sig Start Date End Date Taking? Authorizing Provider  albuterol (PROVENTIL) (2.5 MG/3ML) 0.083% nebulizer solution Take 2.5 mg by nebulization every 4 (four) hours as needed for wheezing or shortness of breath.    Historical Provider, MD  amiodarone (PACERONE) 200 MG tablet Take 1 tablet (200 mg total) by mouth 3 (three) times a week. 05/22/16   Mihai Croitoru, MD  benzonatate (TESSALON) 100 MG capsule Take 100 mg by mouth 3 (three) times daily as needed for cough.    Historical Provider, MD  famotidine (PEPCID) 20 MG tablet Take 20 mg by mouth at bedtime.    Historical Provider, MD  furosemide (LASIX) 40 MG tablet Take 3 tablets by mouth every AM and 2 tablets every PM Patient taking differently: Take 2 tablets by mouth every AM and 2 tablets every PM 03/31/16   Mihai Croitoru, MD  LORazepam (ATIVAN) 0.5 MG tablet Take 0.5 mg by mouth. 1-2 every 4 hours as needed     Historical  Provider, MD  metolazone (ZAROXOLYN) 2.5 MG tablet Take 1 tablet (2.5 mg total) by mouth 2 (two) times a week. Give 30 minutes before morning furosemide (Lasix) dose. 05/15/16 10/24/19  Mihai Croitoru, MD  Morphine Sulfate (MORPHINE CONCENTRATE) 10 mg / 0.5 ml concentrated solution Take 5-10 mg by mouth every 2 (two) hours as needed for moderate pain or shortness of breath (for coughing spells).  02/22/16   Historical Provider, MD  prochlorperazine (COMPAZINE) 10 MG tablet Take 10 mg by mouth every 4 (four) hours as needed. 04/26/16   Historical Provider, MD    Family History Family History  Problem Relation Age of Onset  . Hypertension Mother     Social History Social History  Substance Use Topics  . Smoking status: Never Smoker  . Smokeless tobacco: Former Neurosurgeon    Types: Snuff  . Alcohol use No     Allergies   Penicillins   Review of Systems Review of Systems  Constitutional: Positive for chills and  fatigue. Negative for diaphoresis and fever.  HENT: Negative for congestion and rhinorrhea.   Eyes: Negative for visual disturbance.  Respiratory: Positive for cough and shortness of breath. Negative for sputum production, chest tightness, wheezing and stridor.   Cardiovascular: Negative for chest pain.  Gastrointestinal: Positive for abdominal pain, nausea and vomiting. Negative for constipation, diarrhea, hematochezia and melena.  Genitourinary: Negative for dysuria, flank pain, frequency and hematuria.  Musculoskeletal: Positive for back pain. Negative for neck pain and neck stiffness.  Skin: Negative for wound.  Neurological: Negative for dizziness, weakness, light-headedness, numbness and headaches.  Psychiatric/Behavioral: Negative for agitation and confusion.  All other systems reviewed and are negative.    Physical Exam Updated Vital Signs BP (!) 184/145 (BP Location: Left Arm)   Pulse 112   SpO2 97%   Physical Exam  Constitutional: She is oriented to person, place,  and time. She appears well-developed and well-nourished. No distress.  HENT:  Head: Normocephalic and atraumatic.  Eyes: Conjunctivae and EOM are normal. Pupils are equal, round, and reactive to light.  Neck: Normal range of motion. Neck supple.  Cardiovascular: Normal rate and regular rhythm.   No murmur heard. Pulmonary/Chest: Effort normal. No stridor. No respiratory distress. She has rales. She exhibits no tenderness.  Abdominal: Soft. There is generalized tenderness. There is no guarding.    Musculoskeletal: She exhibits tenderness (left back tenderness under scapula). She exhibits no edema.       Thoracic back: She exhibits tenderness and pain.       Back:  Neurological: She is alert and oriented to person, place, and time. She displays normal reflexes. No cranial nerve deficit. She exhibits normal muscle tone. Coordination normal.  Skin: Skin is warm and dry. No rash noted. She is not diaphoretic.  Psychiatric: She has a normal mood and affect.  Nursing note and vitals reviewed.    ED Treatments / Results  Labs (all labs ordered are listed, but only abnormal results are displayed) Labs Reviewed  COMPREHENSIVE METABOLIC PANEL - Abnormal; Notable for the following:       Result Value   Chloride 92 (*)    BUN 60 (*)    Creatinine, Ser 3.79 (*)    Albumin 2.9 (*)    AST 73 (*)    ALT 69 (*)    Total Bilirubin 4.4 (*)    GFR calc non Af Amer 10 (*)    GFR calc Af Amer 12 (*)    Anion gap 17 (*)    All other components within normal limits  CBC - Abnormal; Notable for the following:    RBC 3.76 (*)    MCV 101.6 (*)    RDW 21.2 (*)    Platelets 137 (*)    All other components within normal limits  URINALYSIS, ROUTINE W REFLEX MICROSCOPIC (NOT AT [facility]) - Abnormal; Notable for the following:    Specific Gravity, Urine <1.005 (*)    All other components within normal limits  BRAIN NATRIURETIC PEPTIDE - Abnormal; Notable for the following:    B Natriuretic Peptide 3,001.9  (*)    All other components within normal limits  GLUCOSE, CAPILLARY - Abnormal; Notable for the following:    Glucose-Capillary 111 (*)    All other components within normal limits  I-STAT CG4 LACTIC ACID, ED - Abnormal; Notable for the following:    Lactic Acid, Venous 5.56 (*)    All other components within normal limits  I-STAT CG4 LACTIC ACID, ED - Abnormal; Notable  for the following:    Lactic Acid, Venous 5.80 (*)    All other components within normal limits  CULTURE, BLOOD (ROUTINE X 2)  CULTURE, BLOOD (ROUTINE X 2)  URINE CULTURE  MRSA PCR SCREENING  LIPASE, BLOOD  TROPONIN I  I-STAT CG4 LACTIC ACID, ED  I-STAT CG4 LACTIC ACID, ED    EKG  EKG Interpretation  Date/Time:  Tuesday May 23 2016 08:02:50 EDT Ventricular Rate:  81 PR Interval:  194 QRS Duration: 174 QT Interval:  494 QTC Calculation: 573 R Axis:   -10 Text Interpretation:  Normal sinus rhythm Possible Left atrial enlargement Left bundle branch block Abnormal ECG Confirmed by Rush LandmarkEGELER MD, Cristal DeerHRISTOPHER ([id number]) on 05/23/2016 10:38:15 AM Also confirmed by Rush LandmarkEGELER MD, CHRISTOPHER [phone]), editor AltamontLOGAN, Cala BradfordKIMBERLY [phone])  on 05/23/2016 10:46:39 AM       Radiology Ct Abdomen Pelvis Wo Contrast  Result Date: 05/23/2016 CLINICAL DATA:  Left upper back pain near scapula for 2 weeks. Epigastric pain. EXAM: CT ABDOMEN AND PELVIS WITHOUT CONTRAST TECHNIQUE: Multidetector CT imaging of the abdomen and pelvis was performed following the standard protocol without IV contrast. COMPARISON:  04/09/2016 FINDINGS: Lower chest: Small left pleural effusion with left lower lobe atelectasis or scarring, stable since prior study. Mild bronchiectasis is scarring in the right lower lobe, stable. Heart is borderline in size. Hepatobiliary: High density material fills the gallbladder, similar prior study, likely related to vicarious excretion contrast. No focal hepatic abnormality. Pancreas: Is mild soft tissue stranding around the  pancreas suggesting mild pancreatitis, similar to prior study. No ductal dilatation. Spleen: No focal abnormality.  Normal size. Adrenals/Urinary Tract: No adrenal abnormality. No focal renal abnormality. No stones or hydronephrosis. Urinary bladder is unremarkable. Stomach/Bowel: Stomach and small bowel as well as large bowel decompressed, grossly unremarkable. Vascular/Lymphatic: Diffuse aortic and iliac calcifications. No aneurysm. Reproductive: Uterus and adnexa unremarkable.  No mass. Other: Free fluid in the abdomen and pelvis, slightly increased in the upper abdomen since prior study. No free air. Musculoskeletal: No acute bony abnormality or focal bone lesion. IMPRESSION: Moderate free fluid in the abdomen and pelvis, slightly increased in the upper abdomen since prior study. Small left pleural effusion.  Bibasilar scarring. Slight stranding around the pancreas is similar to prior study and may be related to third spacing of fluids although acute pancreatitis cannot be excluded. Recommend clinical correlation. Electronically Signed   By: Charlett NoseKevin  Dover M.D.   On: 05/23/2016 11:18   Dg Chest Port 1 View  Result Date: 05/23/2016 CLINICAL DATA:  Cough and left upper back pain near her scapula x 2 weeks and epigastric pain x 2 weeks with nausea. EXAM: PORTABLE CHEST 1 VIEW COMPARISON:  01/26/2016 FINDINGS: The heart is enlarged. Small left pleural effusion is present. Small amount of atelectasis identified at the left lung base. No consolidations or pulmonary edema. IMPRESSION: Stable cardiomegaly. Left lower lobe atelectasis and small effusion. Electronically Signed   By: Norva PavlovElizabeth  Brown M.D.   On: 05/23/2016 09:48    Procedures Procedures (including critical care time)  Emergency Ultrasound Study:   Angiocath insertion Performed by: Canary Brimhristopher J Tegeler  Consent: Verbal consent obtained. Risks and benefits: risks, benefits and alternatives were discussed Immediately prior to procedure the  correct patient, procedure, equipment, support staff and site/side marked as needed.  Indication: difficult IV access Preparation: Patient was prepped and draped in the usual sterile fashion. Vein Location: Right arm vein was visualized during assessment for potential access sites and was found to be patent/ easily compressed  with linear ultrasound.  The needle was visualized with real-time ultrasound and guided into the vein. Gauge: 18 Gauge  Image saved and stored.  Normal blood return.  Patient tolerance: Patient tolerated the procedure well with no immediate complications.   Emergency Ultrasound Study:   Angiocath insertion Performed by: Canary Brim Tegeler  Consent: Verbal consent obtained. Risks and benefits: risks, benefits and alternatives were discussed Immediately prior to procedure the correct patient, procedure, equipment, support staff and site/side marked as needed.  Indication: difficult IV access Preparation: Patient was prepped and draped in the usual sterile fashion. Vein Location: Upper right arm vein was visualized during assessment for potential access sites and was found to be patent/ easily compressed with linear ultrasound.  The needle was visualized with real-time ultrasound and guided into the vein. Gauge: 18  Image saved and stored.  Normal blood return.  Patient tolerance: Patient tolerated the procedure well with no immediate complications.    CRITICAL CARE Performed by: Canary Brim Tegeler Total critical care time: 30 minutes Critical care time was exclusive of separately billable procedures and treating other patients. Critical care was necessary to treat or prevent imminent or life-threatening deterioration. Critical care was time spent personally by me on the following activities: development of treatment plan with patient and/or surrogate as well as nursing, discussions with consultants, evaluation of patient's response to treatment,  examination of patient, obtaining history from patient or surrogate, ordering and performing treatments and interventions, ordering and review of laboratory studies, ordering and review of radiographic studies, pulse oximetry and re-evaluation of patient's condition.      Medications Ordered in ED Medications  famotidine (PEPCID) tablet 20 mg (not administered)  metolazone (ZAROXOLYN) tablet 2.5 mg (not administered)  heparin injection 5,000 Units (5,000 Units Subcutaneous Given 05/23/16 1638)  sodium chloride flush (NS) 0.9 % injection 3 mL (not administered)  potassium chloride SA (K-DUR,KLOR-CON) CR tablet 40 mEq (40 mEq Oral Given 05/23/16 1638)  ondansetron (ZOFRAN) tablet 4 mg (not administered)    Or  ondansetron (ZOFRAN) injection 4 mg (not administered)  furosemide (LASIX) injection 80 mg (not administered)  sodium chloride 0.9 % bolus 1,000 mL (0 mLs Intravenous Stopped 05/23/16 1201)    And  sodium chloride 0.9 % bolus 1,000 mL (0 mLs Intravenous Stopped 05/23/16 1251)    And  sodium chloride 0.9 % bolus 250 mL (0 mLs Intravenous Stopped 05/23/16 1251)  levofloxacin (LEVAQUIN) IVPB 750 mg (0 mg Intravenous Stopped 05/23/16 1203)  vancomycin (VANCOCIN) IVPB 1000 mg/200 mL premix (0 mg Intravenous Stopped 05/23/16 1347)  aztreonam (AZACTAM) 2 g in dextrose 5 % 50 mL IVPB (0 g Intravenous Stopped 05/23/16 1347)     Initial Impression / Assessment and Plan / ED Course  I have reviewed the triage vital signs and the nursing notes.  Pertinent labs & imaging results that were available during my care of the patient were reviewed by me and considered in my medical decision making (see chart for details).  Clinical Course    Theresa Cortez is a 82 y.o. female with a past medical history significant for hypertension, hyperlipidemia, CHF, atrial fibrillation, diverticulosis history, rheumatoid arthritis who presents with abdominal pain, cough, left back pain, nausea, vomiting, and  fatigue. On screening laboratory testing, patient found to have a lactic acidosis of 5.5. This prompted a sepsis workup as well as workup of possible diverticulitis with her history of diverticulitis requiring surgery and her abdominal pain. Patient did report cough however, chest x-ray did not  show acute pneumonia.   Two Ultrasound guided IV's were placed by me. See documentation above.   Laboratory testing showed an AKI with a creatinine of 3.7. Given this, a noncontrasted CT scan was ordered of the abdomen to look for intraabdominal source. CT scan showed free fluid in the abdomen and pelvis that is slightly increased from prior but no acute evidence of diverticulitis. Chest x-ray showed stable cardiomegaly with left lower lobe atelectasis and small effusion.   Per sepsis treatment protocol, pt given broad spectrum ABX and IV fluids. Cultures obtained. Pt did have elevated BNP, however, given concern for sepsis with elevated lactic acid, fluid were started. Pt had an episode of hypotension with BP in 80's during workup.   PT admitted to stepdown for further management of kidney dysfunction in setting of CHF and diuresis necessity. PT remained on home O2 requirement of 2L [place] however, pt will need pulm monitoring as fluid being added to treat kidneys and possible infection.       Final Clinical Impressions(s) / ED Diagnoses   Final diagnoses:  AKI (acute kidney injury) (HCC)  Sepsis, due to unspecified organism [facility])    Clinical Impression: 1. AKI (acute kidney injury) (HCC)   2. Abdominal pain   3. Sepsis, due to unspecified organism [facility])     Disposition: Admit  Condition: Stable    Heide Scales, MD 05/23/16 1730

## 2016-05-23 NOTE — ED Notes (Signed)
IV unsuccessful attempt x 2.   (1 R hand, 1 R AC)

## 2016-05-23 NOTE — ED Notes (Signed)
Upper arm IV site noted to be infiltrated shortly after Normal saline started. IV d/c at this time.

## 2016-05-23 NOTE — ED Notes (Signed)
Antibiotics and fluid delay per IV insertion problems.  Dr. Rush Landmark placed IV access by ultrasound.

## 2016-05-23 NOTE — ED Notes (Signed)
Patient in xray 

## 2016-05-23 NOTE — H&P (Signed)
Date: 05/23/2016               Patient Name:  Theresa Cortez MRN: [id number]  DOB: [birth year] Age / Sex: 82 y.o., female   PCP: Gwenyth BenderEric L Dean, MD              Medical Service: Internal Medicine Teaching Service              Attending Physician: Dr. Tyson Aliasuncan Thomas Vincent, MD    First Contact: Wylene Menyan Arnell Mausolf, MS IV Pager: [phone]  Second Contact: Dr. Lawerance BachBurns Pager: [phone]            After Hours (After 5p/  First Contact Pager: [phone]  weekends / holidays): Second Contact Pager: [phone]   Chief Complaint: Abdominal discomfort, Back pain  History of Present Illness: This is an 82 yo female with a PMHx significant for CKD Stage IV, HFrEF (25-30%), DM, chronic atrial fibrillation, and severe MR who presented to the ED this morning due to persistent abdominal discomfort and back pain. She notes that she has had abdominal "sobbing or queasiness" for the past 2 weeks. She has had nausea and vomiting for one month, non-bloody/non-bilious a couple of times per week. She feels nauseous usually when she is post-prandial. She denies diarrhea and has a normal bowel movement once daily. She has had pain in the upper medial aspect of her back particularly when she sits for too long. She has had a dry cough for the past 2 weeks. It is intermittent and non-productive. She was put on Amiodarone after her hospitalization in May for a heart failure exacerbation and was sent to a hospice facility. She improved clinically and has since been living at her daughter's home and being seen by home hospice weekly. She has been in frequent contact with her cardiologist and they recently went down on her amiodarone dose to MWF and increased her furosemide dose to 80 mg PO BID.   In the ED, T 96.6, BP 100/76, HR 112, satting 97% on 2 L oxygen. No leukocytosis, 5.6. Creatinine elevated at 3.7 (baseline 2.7). BNP 3,000. Troponin negative. Lactic acid 5.56 ->5.8. AST and ALT chronically elevated. T. Bili elevated higher than  normal at 4.4. CXR showed left pleural effusion and stable cardiomegaly. BCx pending. UA without signs of infection and UCx pending. CT abdomen/pelvis showed moderate free fluid in the abdomen and pelvis, slightly increased in the upper abdomen since prior study, small left pleural effusion. Slight stranding around the pancreas is similar to prior study and may be related to third spacing of fluids. Given her lactic acidosis and T 96.6, the ED was concerned for sepsis of unknown etiology and was started on broad spectrum antibiotics with aztreonam, levaquin, vancomycin and IVF per sepsis protocol (patient received 2L bolus followed a 250 cc bolus which was discontinued given our concern for low output heart failure).   Meds: Current Facility-Administered Medications  Medication Dose Route Frequency Provider Last Rate Last Dose  . albuterol (PROVENTIL) (2.5 MG/3ML) 0.083% nebulizer solution 2.5 mg  2.5 mg Nebulization Q4H PRN Alexa Lucrezia Starch Burns, MD      . Melene Muller[START ON 05/24/2016] amiodarone (PACERONE) tablet 200 mg  200 mg Oral Once per day on Mon Wed Fri Servando SnareAlexa R Burns, MD      . aztreonam (AZACTAM) 1 g in dextrose 5 % 50 mL IVPB  1 g Intravenous Q8H Canary Brimhristopher J Tegeler, MD      . famotidine (PEPCID) tablet 20 mg  20 mg Oral  QHS Alexa Lucrezia Starch, MD      . furosemide (LASIX) tablet 80 mg  80 mg Oral BID Servando Snare, MD      . Melene Muller ON 05/25/2016] levofloxacin (LEVAQUIN) IVPB 500 mg  500 mg Intravenous Q48H Marquita Palms, RPH      . [START ON 05/25/2016] metolazone (ZAROXOLYN) tablet 2.5 mg  2.5 mg Oral Once per day on Mon Thu Servando Snare, MD      . Melene Muller ON 05/25/2016] vancomycin (VANCOCIN) IVPB 1000 mg/200 mL premix  1,000 mg Intravenous Q48H Marquita Palms, [facility]       Current Outpatient Prescriptions  Medication Sig Dispense Refill  . albuterol (PROVENTIL) (2.5 MG/3ML) 0.083% nebulizer solution Take 2.5 mg by nebulization every 4 (four) hours as needed for wheezing or shortness of breath.    Marland Kitchen amiodarone  (PACERONE) 200 MG tablet Take 1 tablet (200 mg total) by mouth 3 (three) times a week. 30 tablet 1  . benzonatate (TESSALON) 100 MG capsule Take 100 mg by mouth 3 (three) times daily as needed for cough.    . furosemide (LASIX) 40 MG tablet Take 3 tablets by mouth every AM and 2 tablets every PM (Patient taking differently: Take 2 tablets by mouth every AM and 2 tablets every PM) 150 tablet 3  . LORazepam (ATIVAN) 0.5 MG tablet Take 0.5 mg by mouth. 1-2 every 4 hours as needed     . Morphine Sulfate (MORPHINE CONCENTRATE) 10 mg / 0.5 ml concentrated solution Take 5-10 mg by mouth every 2 (two) hours as needed for moderate pain or shortness of breath (for coughing spells).   0  . prochlorperazine (COMPAZINE) 10 MG tablet Take 10 mg by mouth every 4 (four) hours as needed.  0  . famotidine (PEPCID) 20 MG tablet Take 20 mg by mouth at bedtime.    . metolazone (ZAROXOLYN) 2.5 MG tablet Take 1 tablet (2.5 mg total) by mouth 2 (two) times a week. Give 30 minutes before morning furosemide (Lasix) dose. (Patient taking differently: Take 2.5 mg by mouth 2 (two) times a week. Give 30 minutes before morning furosemide (Lasix) dose. Tuesday,saturday) 30 tablet 11    Allergies: Allergies as of 05/23/2016 - Review Complete 05/23/2016  Allergen Reaction Noted  . Penicillins Swelling 10/11/2012   Past Medical History:  Diagnosis Date  . Arthritis    "hands, arms" (11/23/2015)  . Chronic combined systolic and diastolic heart failure (HCC)    a. 01/2016 Echo: EF 25-30%, mild LVH, antsept, infsept, inf HK, mod to sev MR, mod dil LA, mild TR, PASP .  . CKD (chronic kidney disease), stage III   . DJD (degenerative joint disease)   . History of blood transfusion X 2   w/knee replacement  . History of diverticulosis   . Hyperlipidemia   . Hypertensive heart disease   . Hypothyroidism   . Interstitial lung disease (HCC)   . Mitral regurgitation    a. 01/2016 Mod to sev by echo.  Marland Kitchen NICM (nonischemic  cardiomyopathy) (HCC)    a. 11/2015 Cath: mild nonobs dzs; b. 01/2016 Echo: EF 25-30%, mild LVH, antsept, infsept, inf HK, mod to sev MR, mod dil LA, mild TR, PASP .  . Paroxysmal atrial fibrillation (HCC)   . Rheumatoid arthritis(714.0)   . Stroke [facility]) 2000   denies residual on 11/23/2015  . Type II diabetes mellitus (HCC)   . Wound infection (HCC)    Right knee    Past Surgical History:  Procedure Laterality Date  . APPENDECTOMY    . CARDIAC CATHETERIZATION N/A 11/25/2015   Procedure: Right/Left Heart Cath and Coronary Angiography;  Surgeon: Iran Ouch, MD;  Location: [facility] INVASIVE CV LAB;  Service: Cardiovascular;  Laterality: N/A;  . CATARACT EXTRACTION W/ INTRAOCULAR LENS IMPLANT Right   . INCISION AND DRAINAGE MOUTH Right 2014   "knee; took replacement out; put  spacers in"  . KNEE ARTHROSCOPY Right   . MEDIAL PARTIAL KNEE REPLACEMENT Right   . PARTIAL COLECTOMY     descending colon by Dr. Orson Slick  . TOTAL KNEE ARTHROPLASTY Right 08/07/2011; 2014  . TUBAL LIGATION     Family History  Problem Relation Age of Onset  . Hypertension Mother    Social History   Social History  . Marital status: Widowed    Spouse name: N/A  . Number of children: N/A  . Years of education: N/A   Occupational History  . Not on file.   Social History Main Topics  . Smoking status: Never Smoker  . Smokeless tobacco: Former Neurosurgeon    Types: Snuff  . Alcohol use No  . Drug use: No  . Sexual activity: No   Other Topics Concern  . Not on file   Social History Narrative  . No narrative on file    Review of Systems: Constitutional: positive for fatigue, negative for chills, fevers and malaise Eyes: positive for icterus Respiratory: positive for cough, negative for shortness of breath Cardiovascular: positive for fatigue, irregular heart beat and lower extremity edema, negative for chest pain and syncope Gastrointestinal: positive for abdominal pain, nausea, reflux symptoms and  vomiting, negative for constipation and diarrhea Musculoskeletal:positive for back pain Neurological: positive for weakness, negative for dizziness and headaches  Physical Exam: BP 93/71   Pulse 79   Temp (!) 96.9 F (36.1 C) (Rectal)   Resp 16   SpO2 100%    General appearance: alert, cooperative, appears stated age and mild distress Head: Normocephalic, without obvious abnormality, atraumatic Eyes: positive findings: sclera icteric Throat: normal findings: tongue midline and normal Neck: no adenopathy, no JVD and supple, symmetrical, trachea midline Lungs: diffuse Inspiratory crackles Heart: regular rate and rhythm and 3/6 systolic ejection murmur Abdomen: soft, non-tender; bowel sounds normal; no masses,  no organomegaly and Negative Murphy's sign Extremities: edema 2+ pitting to knee bilaterally Pulses: 1+ and symmetric Skin: Legs and arms warm, hands and feet cool to the touch  Lab results: Basic Metabolic Panel:  Recent Labs  09/73/53 0845  NA 139  K 3.7  CL 92*  CO2 30  GLUCOSE 78  BUN 60*  CREATININE 3.79*  CALCIUM 10.3   Liver Function Tests:  Recent Labs  05/23/16 0845  AST 73*  ALT 69*  ALKPHOS 120  BILITOT 4.4*  PROT 7.2  ALBUMIN 2.9*    Recent Labs  05/23/16 0845  LIPASE 32   No results for input(s): AMMONIA in the last 72 hours. CBC:  Recent Labs  05/23/16 0845  WBC 5.6  HGB 12.5  HCT 38.2  MCV 101.6*  PLT 137*   Cardiac Enzymes:  Recent Labs  05/23/16 0935  TROPONINI <0.03   BNP: No results for input(s): PROBNP in the last 72 hours. D-Dimer: No results for input(s): DDIMER in the last 72 hours. CBG: No results for input(s): GLUCAP in the last 72 hours. Hemoglobin A1C: No results for input(s): HGBA1C in the last 72 hours. Fasting Lipid Panel: No results for input(s): CHOL, HDL, LDLCALC, TRIG, CHOLHDL,  LDLDIRECT in the last 72 hours. Thyroid Function Tests: No results for input(s): TSH, T4TOTAL, FREET4, T3FREE,  THYROIDAB in the last 72 hours. Anemia Panel: No results for input(s): VITAMINB12, FOLATE, FERRITIN, TIBC, IRON, RETICCTPCT in the last 72 hours. Coagulation: No results for input(s): LABPROT, INR in the last 72 hours. Urine Drug Screen: Drugs of Abuse  No results found for: LABOPIA, COCAINSCRNUR, LABBENZ, AMPHETMU, THCU, LABBARB  Alcohol Level: No results for input(s): ETH in the last 72 hours. Urinalysis: No results for input(s): COLORURINE, LABSPEC, PHURINE, GLUCOSEU, HGBUR, BILIRUBINUR, KETONESUR, PROTEINUR, UROBILINOGEN, NITRITE, LEUKOCYTESUR in the last 72 hours.  Invalid input(s): APPERANCEUR  Imaging results:  Ct Abdomen Pelvis Wo Contrast  Result Date: 05/23/2016 CLINICAL DATA:  Left upper back pain near scapula for 2 weeks. Epigastric pain. EXAM: CT ABDOMEN AND PELVIS WITHOUT CONTRAST TECHNIQUE: Multidetector CT imaging of the abdomen and pelvis was performed following the standard protocol without IV contrast. COMPARISON:  04/09/2016 FINDINGS: Lower chest: Small left pleural effusion with left lower lobe atelectasis or scarring, stable since prior study. Mild bronchiectasis is scarring in the right lower lobe, stable. Heart is borderline in size. Hepatobiliary: High density material fills the gallbladder, similar prior study, likely related to vicarious excretion contrast. No focal hepatic abnormality. Pancreas: Is mild soft tissue stranding around the pancreas suggesting mild pancreatitis, similar to prior study. No ductal dilatation. Spleen: No focal abnormality.  Normal size. Adrenals/Urinary Tract: No adrenal abnormality. No focal renal abnormality. No stones or hydronephrosis. Urinary bladder is unremarkable. Stomach/Bowel: Stomach and small bowel as well as large bowel decompressed, grossly unremarkable. Vascular/Lymphatic: Diffuse aortic and iliac calcifications. No aneurysm. Reproductive: Uterus and adnexa unremarkable.  No mass. Other: Free fluid in the abdomen and pelvis,  slightly increased in the upper abdomen since prior study. No free air. Musculoskeletal: No acute bony abnormality or focal bone lesion. IMPRESSION: Moderate free fluid in the abdomen and pelvis, slightly increased in the upper abdomen since prior study. Small left pleural effusion.  Bibasilar scarring. Slight stranding around the pancreas is similar to prior study and may be related to third spacing of fluids although acute pancreatitis cannot be excluded. Recommend clinical correlation. Electronically Signed   By: Charlett Nose M.D.   On: 05/23/2016 11:18   Dg Chest Port 1 View  Result Date: 05/23/2016 CLINICAL DATA:  Cough and left upper back pain near her scapula x 2 weeks and epigastric pain x 2 weeks with nausea. EXAM: PORTABLE CHEST 1 VIEW COMPARISON:  01/26/2016 FINDINGS: The heart is enlarged. Small left pleural effusion is present. Small amount of atelectasis identified at the left lung base. No consolidations or pulmonary edema. IMPRESSION: Stable cardiomegaly. Left lower lobe atelectasis and small effusion. Electronically Signed   By: Norva Pavlov M.D.   On: 05/23/2016 09:48    Other results: EKG: Regular rate, normal sinus rhythm, LBBB CXR: Left pleural effusion and stable cardiomegaly CT abdomen/pelvis: moderate free fluid in the abdomen and pelvis, slightly increased in the upper abdomen since prior study, small left pleural effusion. Slight stranding around the pancreas is similar to prior study and may be related to third spacing of fluids.   Assessment & Plan by Problem: Principal Problem:   Low output heart failure (HCC) Active Problems:   DM type 2 (diabetes mellitus, type 2) (HCC)   HLD (hyperlipidemia)   Hypothyroidism   Chronic combined systolic and diastolic congestive heart failure (HCC)   Coronary artery disease   Chronic renal insufficiency, stage IV (severe)   Interstitial  lung disease (HCC)   Paroxysmal atrial fibrillation (HCC)   Hypertensive heart  disease  81 yo female with multiple co-morbidities presenting with vague complaints and lactic acidosis concerning for sepsis vs. Cardiogenic shock  1.) HFrEF: BNP 3000 at time of admission. Troponins negative. Chief complaint of vague abdominal discomfort could be related to abdominal wall edema or poor perfusion of vital organs. She was recently hospitalized in May 2017 for a similar presentation. Her weight has been stable at home and she is near her target dry weight of 160 lbs. She has been compliant with her home metolazone and Lasix 80 mg BID regimen. Concerned for poor perfusion given lactic acidosis, cold feet/hands, and elevated Cr and LFTs. Heart Failure Team has agreed to see her.   - Heart Failure consult   - Admit to step-down   - Lasix 80 mg IV BID   - Continue Metolazone Tuesday/Saturday   - Daily weights   - Strict I/Os   - Daily BMETs  2.) Abdominal Discomfort / Lactic Acidosis: Has had nausea and vomiting for the past month. Usually associated with eating. Non-bilious/non-bloody. Patient noted to have lactic acid level of 5.6 at arrival to ED, now at 5.8. Likely cardiogenic shock vs. Sepsis. Favoring cardiogenic shock as patient is afebrile with no leukocytosis and no obvious source of infection. In addition, CXR, U/A, and CT abdomen/pelvis not concerning for infection. ED started patient on broad spectrum abx for sepsis and IVF for sepsis (patient received 2L fluids plus 250 cc bolus which was d/c'ed given concern for low output HF). Would consider RUQ U/S if symptoms continue.    - Discontinue antibiotics   - f/u BCx/UCx   - Hold IVFs   - q3h Lactic acid  3.) Upper Back Pain: Likely musculoskeletal as she notices it most when she has been sitting around. Also could be from her GERD. She was prescribed Pepcid but has not gotten it filled yet.   4.) Cough: Patient diagnosed with UIP by pulmonologist, last seen on 05/10/16. No work-up pursued as patient is seen as  palliative.  5.) Hypotension: Has history of hypertension, has been chronically hypotensive since her last admission. Likely due to low CO heart failure vs. sepsis  6.) Transaminitis w/ Increased TBili: Had transaminitis at recent hospitalization. Could be due to poor perfusion, amiodarone, or cholecystitis.  7.) AoCKD-IV: Baseline is CR in the 2.7 range. At admission Cr is 3.79. This is likely due to poor renal perfusion from low output heart failure vs recent metolazone dosage increase   - Avoid nephrotoxic medications   - Trend Cr  8.) Hypothyroidism: TSH on 12/18/15 was 2.6.  9.) DM: Diet controlled. Last A1c was 7.3 on 11/23/15.  10.) Paroxysmal Atrial Fibrillation: Currently in NSR. Rate controlled with amiodarone. CHADVasc score of 5 (female, age, HF, HTN, DM). Not on anticoagulants    - Continue Amiodarone 200 mg three times weekly (MWF)  DVT/VTE Prophylaxis: Heparin  FEN: Heart Healthy Code: DNR Dispo: Inpatient   This is a Psychologist, occupational Note.  The care of the patient was discussed with Dr. Lawerance Bach and the assessment and plan was formulated with their assistance.  Please see their note for official documentation of the patient encounter.   Signed: Wylene Men, Medical Student 05/23/2016, 1:43 PM

## 2016-05-23 NOTE — Consult Note (Signed)
Advanced Heart Failure Team Consult Note  Referring Physician: Dr. Oswaldo Done Primary Physician: Gwenyth Bender, MD Primary Cardiologist:  Dr. Jomarie Longs  Reason for Consultation: A/C systolic CHF / Suspected Low output.   HPI:    Theresa Cortez is a 82 y.o. with a history of HTN, HLD, Hypothyroidism, Chronic combined CHF, moderate to severe MR, PAF, Stage IV CKD, ILD, and RA.  Pt was diagnosed with Systolic HF earlier this year.  Had [facility] 11/2015 with mild obstructed CAD; NICM. She was admitted to [facility] in 01/2016 with afiib RVR, worsening edema, and found to be in low output HF.  Short course of inotrope and diuresis attempted, but ultimately pt deemed terminally ill and discharged to [facility].    Pt remained stable and "graduated" [facility]. Has been followed closely with Dr. Royann Shivers. [facility], who come out to visit weekly.   Most recently seen in [facility] clinic 05/12/16 and metolazone increased. Despite weight loss, thought to still be volume overloaded.   Presented to [facility] today with persistent abdominal pain with N/V. Pertinent labs on admission include WBC 5.6, Cr 2.7 (baseline 2.7), BNP > 3000, Troponin negative, Lactic acid 5.56 -> 5.8. AST and ALT chronically elevated. Total Bili 4.4. CXR showed left pleural effusion and stable cardiomegaly.   BCx and UCx drawn to r/o sepsis with Lactic acid.  UA negative.  CT abdomen/pelvis with moderate amount of free fluid in the abdomen and pelvis.    Started on broad spectrum ABX with IVF.  IVF has since been stopped.     She has poor appetite. Chronic abdominal and back pain.  She is relatively immobile, needs a "hug" assist to take a few steps.  Having more N/V, early satiety.  Denies BRBPR or Melena.    Review of Systems: [y] = yes, [ ]  = no   General: Weight gain [ ] ; Weight loss [y]; Anorexia [y]; Fatigue [y]; Fever [ ] ; Chills [ ] ; Weakness [y]  Cardiac: Chest pain/pressure [ ] ; Resting SOB [y];  Exertional SOB [y]; Orthopnea [y]; Pedal Edema [y]; Palpitations [ ] ; Syncope [ ] ; Presyncope [ ] ; Paroxysmal nocturnal dyspnea[ ]   Pulmonary: Cough [y]; Wheezing[ ] ; Hemoptysis[ ] ; Sputum [ ] ; Snoring [ ]   GI: Vomiting[y]; Dysphagia[ ] ; Melena[ ] ; Hematochezia [ ] ; Heartburn[ ] ; Abdominal pain [y]; Constipation [ ] ; Diarrhea [ ] ; BRBPR [ ]   GU: Hematuria[ ] ; Dysuria [ ] ; Nocturia[ ]   Vascular: Pain in legs with walking [ ] ; Pain in feet with lying flat [ ] ; Non-healing sores [ ] ; Stroke [ ] ; TIA [ ] ; Slurred speech [ ] ;  Neuro: Headaches[ ] ; Vertigo[ ] ; Seizures[ ] ; Paresthesias[ ] ;Blurred vision [ ] ; Diplopia [ ] ; Vision changes [ ]   Ortho/Skin: Arthritis [y]; Joint pain [y]; Muscle pain [y]; Joint swelling [ ] ; Back Pain [y]; Rash [ ]   Psych: Depression[ ] ; Anxiety[ ]   Heme: Bleeding problems [ ] ; Clotting disorders [ ] ; Anemia [ ]   Endocrine: Diabetes [y]; Thyroid dysfunction[y]  Home Medications Prior to Admission medications   Medication Sig Start Date End Date Taking? Authorizing Provider  albuterol (PROVENTIL) (2.5 MG/3ML) 0.083% nebulizer solution Take 2.5 mg by nebulization every 4 (four) hours as needed for wheezing or shortness of breath.   Yes Historical Provider, MD  amiodarone (PACERONE) 200 MG tablet Take 1 tablet (200 mg total) by mouth 3 (three) times a week. 05/22/16  Yes Mihai Croitoru, MD  benzonatate (TESSALON) 100 MG capsule  Take 100 mg by mouth 3 (three) times daily as needed for cough.   Yes Historical Provider, MD  furosemide (LASIX) 40 MG tablet Take 3 tablets by mouth every AM and 2 tablets every PM Patient taking differently: Take 2 tablets by mouth every AM and 2 tablets every PM 03/31/16  Yes Mihai Croitoru, MD  LORazepam (ATIVAN) 0.5 MG tablet Take 0.5 mg by mouth. 1-2 every 4 hours as needed    Yes Historical Provider, MD  Morphine Sulfate (MORPHINE CONCENTRATE) 10 mg / 0.5 ml concentrated solution Take 5-10 mg by mouth every 2 (two) hours as needed for moderate  pain or shortness of breath (for coughing spells).  02/22/16  Yes Historical Provider, MD  prochlorperazine (COMPAZINE) 10 MG tablet Take 10 mg by mouth every 4 (four) hours as needed. 04/26/16  Yes Historical Provider, MD  famotidine (PEPCID) 20 MG tablet Take 20 mg by mouth at bedtime.    Historical Provider, MD  metolazone (ZAROXOLYN) 2.5 MG tablet Take 1 tablet (2.5 mg total) by mouth 2 (two) times a week. Give 30 minutes before morning furosemide (Lasix) dose. Patient taking differently: Take 2.5 mg by mouth 2 (two) times a week. Give 30 minutes before morning furosemide (Lasix) dose. Tuesday,saturday 05/15/16 10/24/19  Thurmon Fair, MD    Past Medical History: Past Medical History:  Diagnosis Date  . Arthritis    "hands, arms" (11/23/2015)  . Chronic combined systolic and diastolic heart failure (HCC)    a. 01/2016 Echo: EF 25-30%, mild LVH, antsept, infsept, inf HK, mod to sev MR, mod dil LA, mild TR, PASP .  . CKD (chronic kidney disease), stage III   . DJD (degenerative joint disease)   . History of blood transfusion X 2   w/knee replacement  . History of diverticulosis   . Hyperlipidemia   . Hypertensive heart disease   . Hypothyroidism   . Interstitial lung disease (HCC)   . Mitral regurgitation    a. 01/2016 Mod to sev by echo.  Marland Kitchen NICM (nonischemic cardiomyopathy) (HCC)    a. 11/2015 Cath: mild nonobs dzs; b. 01/2016 Echo: EF 25-30%, mild LVH, antsept, infsept, inf HK, mod to sev MR, mod dil LA, mild TR, PASP .  . Paroxysmal atrial fibrillation (HCC)   . Rheumatoid arthritis(714.0)   . Stroke [facility]) 2000   denies residual on 11/23/2015  . Type II diabetes mellitus (HCC)   . Wound infection (HCC)    Right knee     Past Surgical History: Past Surgical History:  Procedure Laterality Date  . APPENDECTOMY    . CARDIAC CATHETERIZATION N/A 11/25/2015   Procedure: Right/Left Heart Cath and Coronary Angiography;  Surgeon: Iran Ouch, MD;  Location: [facility] INVASIVE CV  LAB;  Service: Cardiovascular;  Laterality: N/A;  . CATARACT EXTRACTION W/ INTRAOCULAR LENS IMPLANT Right   . INCISION AND DRAINAGE MOUTH Right 2014   "knee; took replacement out; put  spacers in"  . KNEE ARTHROSCOPY Right   . MEDIAL PARTIAL KNEE REPLACEMENT Right   . PARTIAL COLECTOMY     descending colon by Dr. Orson Slick  . TOTAL KNEE ARTHROPLASTY Right 08/07/2011; 2014  . TUBAL LIGATION      Family History: Family History  Problem Relation Age of Onset  . Hypertension Mother     Social History: Social History   Social History  . Marital status: Widowed    Spouse name: N/A  . Number of children: N/A  . Years of education: N/A   Social  History Main Topics  . Smoking status: Never Smoker  . Smokeless tobacco: Former Neurosurgeon    Types: Snuff  . Alcohol use No  . Drug use: No  . Sexual activity: No   Other Topics Concern  . None   Social History Narrative  . None    Allergies:  Allergies  Allergen Reactions  . Penicillins Swelling    Has patient had a PCN reaction causing immediate rash, facial/tongue/throat swelling, SOB or lightheadedness with hypotension: unknown Has patient had a PCN reaction causing severe rash involving mucus membranes or skin necrosis: unknown Has patient had a PCN reaction that required hospitalization: unknown Has patient had a PCN reaction occurring within the last 10 years: unknown If all of the above answers are "NO", then may proceed with Cephalosporin use.     Objective:    Vital Signs:   Temp:  [96.9 F (36.1 C)-97 F (36.1 C)] 97 F (36.1 C) (09/12 1430) Pulse Rate:  [77-112] 81 (09/12 1430) Resp:  [13-22] 17 (09/12 1430) BP: (87-184)/(58-145) 91/75 (09/12 1430) SpO2:  [93 %-100 %] 100 % (09/12 1430) Weight:  [161 lb 13.1 oz (73.4 kg)] 161 lb 13.1 oz (73.4 kg) (09/12 1430)    Weight change: Filed Weights   05/23/16 1430  Weight: 161 lb 13.1 oz (73.4 kg)    Intake/Output:  No intake or output data in the 24 hours ending  05/23/16 1513   Physical Exam: General:  Elderly and chronically appearing. Fatigued and weak.  HEENT: normal Neck: supple. JVP to jaw. Carotids 2+ bilat; no bruits. No lymphadenopathy or thyromegaly appreciated. Cor: PMI nondisplaced. RRR. No rubs or murmurs. +S3 Lungs: Diminished throughout.  Abdomen: soft, mild tenderness over bladder, ND, no HSM. No bruits or masses. +BS  Extremities: no cyanosis, clubbing, rash. 2-3 + peripheral edema. Extremities cold to the touch. Neuro: Alert and oriented to person and place. Cranial nerves grossly intact. moves all 4 extremities slowly. Affect flat.  Telemetry: NSR  Labs: Basic Metabolic Panel:  Recent Labs Lab 05/23/16 0845  NA 139  K 3.7  CL 92*  CO2 30  GLUCOSE 78  BUN 60*  CREATININE 3.79*  CALCIUM 10.3    Liver Function Tests:  Recent Labs Lab 05/23/16 0845  AST 73*  ALT 69*  ALKPHOS 120  BILITOT 4.4*  PROT 7.2  ALBUMIN 2.9*    Recent Labs Lab 05/23/16 0845  LIPASE 32   No results for input(s): AMMONIA in the last 168 hours.  CBC:  Recent Labs Lab 05/23/16 0845  WBC 5.6  HGB 12.5  HCT 38.2  MCV 101.6*  PLT 137*    Cardiac Enzymes:  Recent Labs Lab 05/23/16 0935  TROPONINI <0.03    BNP: BNP (last 3 results)  Recent Labs  01/15/16 0523 01/24/16 2314 05/23/16 0935  BNP 1,386.8* 2,194.5* 3,001.9*    ProBNP (last 3 results) No results for input(s): PROBNP in the last 8760 hours.   CBG:  Recent Labs Lab 05/23/16 1455  GLUCAP 111*    Coagulation Studies: No results for input(s): LABPROT, INR in the last 72 hours.  Other results: EKG: NSR 80s  Imaging: Ct Abdomen Pelvis Wo Contrast  Result Date: 05/23/2016 CLINICAL DATA:  Left upper back pain near scapula for 2 weeks. Epigastric pain. EXAM: CT ABDOMEN AND PELVIS WITHOUT CONTRAST TECHNIQUE: Multidetector CT imaging of the abdomen and pelvis was performed following the standard protocol without IV contrast. COMPARISON:   04/09/2016 FINDINGS: Lower chest: Small left pleural effusion with  left lower lobe atelectasis or scarring, stable since prior study. Mild bronchiectasis is scarring in the right lower lobe, stable. Heart is borderline in size. Hepatobiliary: High density material fills the gallbladder, similar prior study, likely related to vicarious excretion contrast. No focal hepatic abnormality. Pancreas: Is mild soft tissue stranding around the pancreas suggesting mild pancreatitis, similar to prior study. No ductal dilatation. Spleen: No focal abnormality.  Normal size. Adrenals/Urinary Tract: No adrenal abnormality. No focal renal abnormality. No stones or hydronephrosis. Urinary bladder is unremarkable. Stomach/Bowel: Stomach and small bowel as well as large bowel decompressed, grossly unremarkable. Vascular/Lymphatic: Diffuse aortic and iliac calcifications. No aneurysm. Reproductive: Uterus and adnexa unremarkable.  No mass. Other: Free fluid in the abdomen and pelvis, slightly increased in the upper abdomen since prior study. No free air. Musculoskeletal: No acute bony abnormality or focal bone lesion. IMPRESSION: Moderate free fluid in the abdomen and pelvis, slightly increased in the upper abdomen since prior study. Small left pleural effusion.  Bibasilar scarring. Slight stranding around the pancreas is similar to prior study and may be related to third spacing of fluids although acute pancreatitis cannot be excluded. Recommend clinical correlation. Electronically Signed   By: Charlett Nose M.D.   On: 05/23/2016 11:18   Dg Chest Port 1 View  Result Date: 05/23/2016 CLINICAL DATA:  Cough and left upper back pain near her scapula x 2 weeks and epigastric pain x 2 weeks with nausea. EXAM: PORTABLE CHEST 1 VIEW COMPARISON:  01/26/2016 FINDINGS: The heart is enlarged. Small left pleural effusion is present. Small amount of atelectasis identified at the left lung base. No consolidations or pulmonary edema. IMPRESSION:  Stable cardiomegaly. Left lower lobe atelectasis and small effusion. Electronically Signed   By: Norva Pavlov M.D.   On: 05/23/2016 09:48      Medications:     Current Medications: . famotidine  20 mg Oral QHS  . furosemide  80 mg Intravenous BID  . heparin  5,000 Units Subcutaneous Q8H  . metolazone  2.5 mg Oral Once per day on Mon Thu  . potassium chloride  40 mEq Oral BID  . sodium chloride flush  3 mL Intravenous Q12H     Infusions:      Assessment   1. Cardiogenic shock 2. Acute on chronic End stage CHF 3. Lactic acidosis 4. ARF on CKD stage IV 5. Hypotension 6. Elevated AST/ALT 7. Severe MR/TR 8. Hypoalbuniemia - Albumin 2.9 9. Hypothroidism 10. DM II  Plan    She is in clear cardiogenic shock, similar to her previous admission.   She has a very poor short-term prognosis, with CKD worsened, general dyspnea and chronic pain.  Can try to correct volume status with IV lasix, but poor option with ARF on CKD and low output. She is not a candidate for advanced therapies including IV inotropic support with hospice status.   Agree with IV lasix + metolazone for now. Insert Foley.  Will consult palliative care.   Length of Stay: 0  Graciella Freer PA-C 05/23/2016, 3:13 PM  Advanced Heart Failure Team Pager [phone] (M-F; 7a - 4p)  Please contact [facility] for night-coverage after hours (4p -7a ) and weekends on [email or web address]  Patient seen and examined with Otilio Saber, PA-C. We discussed all aspects of the encounter. I agree with the assessment and plan as stated above.   Theresa Cortez unfortunately has recurrent low output HF with Class IV HF, progressive renal failure, lactic acidosis and volume overload. She was admitted  with ab pain but is currently comfortable. She was at [facility] at May but now home with Hospice. I had a long talk with her and her daughter and confirmed that this is recurrent low output HF and our options are limited. Agree  with IV lasix for now. Will place Foley for safety and comfort. Options limited to transfer back to [facility] or [facility] for comfort care. Will ask Hospice team to see in house. D/w IMTS.  Bensimhon, Daniel,MD 5:07 PM

## 2016-05-23 NOTE — ED Notes (Signed)
Myself and Amy, RN performed a rectal temperature; visitor at bedside

## 2016-05-23 NOTE — ED Notes (Signed)
Admitting at bedside 

## 2016-05-23 NOTE — ED Notes (Signed)
Attempted report 

## 2016-05-23 NOTE — Progress Notes (Signed)
Pharmacy Antibiotic Note  Theresa Cortez is a 82 y.o. female admitted on 05/23/2016 with sepsis.  Pharmacy has been consulted for aztreonam, levaquin and vancomycin dosing. Patient presents with back and abdominal pain.   Pt received aztreonam 2g, levaquin 750mg  and vancomycin 1g IV once in the ED.  Plan: Vancomycin 1g IV every 48 hours.  Goal trough 15-20 mcg/mL.  Aztreonam 1g IV q8h Levaquin 500mg  IV q48h Monitor culture data, renal function and clinical course VT at SS prn     Temp (24hrs), Avg:96.9 F (36.1 C), Min:96.9 F (36.1 C), Max:96.9 F (36.1 C)   Recent Labs Lab 05/23/16 0845 05/23/16 0849  WBC 5.6  --   LATICACIDVEN  --  5.56*    CrCl cannot be calculated (Patient's most recent lab result is older than the maximum 21 days allowed.).    Allergies  Allergen Reactions  . Penicillins Swelling    Has patient had a PCN reaction causing immediate rash, facial/tongue/throat swelling, SOB or lightheadedness with hypotension: unknown Has patient had a PCN reaction causing severe rash involving mucus membranes or skin necrosis: unknown Has patient had a PCN reaction that required hospitalization: unknown Has patient had a PCN reaction occurring within the last 10 years: unknown If all of the above answers are "NO", then may proceed with Cephalosporin use.     Antimicrobials this admission: Aztreonam 9/12 >>  Levaquin 9/12 >>  Vanc 9/12 >>  Dose adjustments this admission: n/a  Microbiology results:  BCx:   UCx:    Sputum:    MRSA PCR:    11/12. 11/12, PharmD, BCPS Clinical Pharmacist Pager [phone] 05/23/2016 9:20 AM

## 2016-05-23 NOTE — ED Notes (Signed)
Brought patient back to room via wheelchair with family in tow; myself and Amy, RN assisted patient from wheelchair to stretcher; patient undressed, in gown, on monitor, continuous pulse oximetry, blood pressure cuff and oxygen [place] (2L); visitor at bedside

## 2016-05-24 DIAGNOSIS — I130 Hypertensive heart and chronic kidney disease with heart failure and stage 1 through stage 4 chronic kidney disease, or unspecified chronic kidney disease: Secondary | ICD-10-CM

## 2016-05-24 DIAGNOSIS — N179 Acute kidney failure, unspecified: Secondary | ICD-10-CM

## 2016-05-24 DIAGNOSIS — I5043 Acute on chronic combined systolic (congestive) and diastolic (congestive) heart failure: Secondary | ICD-10-CM

## 2016-05-24 DIAGNOSIS — N184 Chronic kidney disease, stage 4 (severe): Secondary | ICD-10-CM

## 2016-05-24 DIAGNOSIS — I480 Paroxysmal atrial fibrillation: Secondary | ICD-10-CM

## 2016-05-24 DIAGNOSIS — I340 Nonrheumatic mitral (valve) insufficiency: Secondary | ICD-10-CM

## 2016-05-24 LAB — BASIC METABOLIC PANEL
Anion gap: 18 — ABNORMAL HIGH (ref 5–15)
BUN: 57 mg/dL — AB (ref 6–20)
CALCIUM: 9.8 mg/dL (ref 8.9–10.3)
CHLORIDE: 92 mmol/L — AB (ref 101–111)
CO2: 27 mmol/L (ref 22–32)
CREATININE: 3.92 mg/dL — AB (ref 0.44–1.00)
GFR calc non Af Amer: 10 mL/min — ABNORMAL LOW (ref >60)
GFR, EST AFRICAN AMERICAN: 11 mL/min — AB (ref >60)
Glucose, Bld: 95 mg/dL (ref 65–99)
Potassium: 4.1 mmol/L (ref 3.5–5.1)
SODIUM: 137 mmol/L (ref 135–145)

## 2016-05-24 LAB — CBC
HEMATOCRIT: 36.7 % (ref 36.0–46.0)
Hemoglobin: 11.7 g/dL — ABNORMAL LOW (ref 12.0–15.0)
MCH: 32.3 pg (ref 26.0–34.0)
MCHC: 31.9 g/dL (ref 30.0–36.0)
MCV: 101.4 fL — ABNORMAL HIGH (ref 78.0–100.0)
PLATELETS: 133 10*3/uL — AB (ref 150–400)
RBC: 3.62 MIL/uL — ABNORMAL LOW (ref 3.87–5.11)
RDW: 21.3 % — AB (ref 11.5–15.5)
WBC: 4.9 10*3/uL (ref 4.0–10.5)

## 2016-05-24 LAB — URINE CULTURE: CULTURE: NO GROWTH

## 2016-05-24 LAB — MAGNESIUM: MAGNESIUM: 2.3 mg/dL (ref 1.7–2.4)

## 2016-05-24 MED ORDER — MORPHINE SULFATE (CONCENTRATE) 10 MG/0.5ML PO SOLN
5.0000 mg | ORAL | Status: DC | PRN
  Administered 2016-05-24 – 2016-05-25 (×2): 5 mg via ORAL
  Administered 2016-05-25: 10 mg via ORAL
  Administered 2016-05-25: 5 mg via ORAL
  Filled 2016-05-24 (×4): qty 0.5

## 2016-05-24 MED ORDER — HALOPERIDOL 1 MG PO TABS
0.5000 mg | ORAL_TABLET | ORAL | Status: DC | PRN
  Administered 2016-05-25: 0.5 mg via ORAL
  Filled 2016-05-24 (×2): qty 1

## 2016-05-24 MED ORDER — MORPHINE SULFATE (PF) 2 MG/ML IV SOLN: 1.0000 mg | INTRAVENOUS | Status: DC | PRN

## 2016-05-24 MED ORDER — LORAZEPAM 2 MG/ML IJ SOLN: 1.0000 mg | INTRAMUSCULAR | Status: DC | PRN

## 2016-05-24 MED ORDER — HALOPERIDOL LACTATE 5 MG/ML IJ SOLN: 0.5000 mg | INTRAMUSCULAR | Status: DC | PRN

## 2016-05-24 MED ORDER — HALOPERIDOL LACTATE 2 MG/ML PO CONC
0.5000 mg | ORAL | Status: DC | PRN
  Administered 2016-05-25: 0.5 mg via SUBLINGUAL
  Filled 2016-05-24 (×3): qty 0.3

## 2016-05-24 MED ORDER — GLYCOPYRROLATE 0.2 MG/ML IJ SOLN
0.2000 mg | INTRAMUSCULAR | Status: DC | PRN
  Filled 2016-05-24: qty 1

## 2016-05-24 MED ORDER — ACETAMINOPHEN 650 MG RE SUPP: 650.0000 mg | Freq: Four times a day (QID) | RECTAL | Status: DC | PRN

## 2016-05-24 MED ORDER — FUROSEMIDE 10 MG/ML IJ SOLN
80.0000 mg | Freq: Two times a day (BID) | INTRAMUSCULAR | Status: DC
  Administered 2016-05-24: 80 mg via INTRAVENOUS
  Filled 2016-05-24: qty 8

## 2016-05-24 MED ORDER — BIOTENE DRY MOUTH MT LIQD: 15.0000 mL | OROMUCOSAL | Status: DC | PRN

## 2016-05-24 MED ORDER — SODIUM CHLORIDE 0.9 % IV SOLN
8.0000 mg | Freq: Four times a day (QID) | INTRAVENOUS | Status: DC | PRN
  Filled 2016-05-24: qty 4

## 2016-05-24 MED ORDER — FUROSEMIDE 80 MG PO TABS: 80.0000 mg | ORAL_TABLET | Freq: Two times a day (BID) | ORAL | Status: DC

## 2016-05-24 MED ORDER — POLYVINYL ALCOHOL 1.4 % OP SOLN
1.0000 [drp] | Freq: Four times a day (QID) | OPHTHALMIC | Status: DC | PRN
  Filled 2016-05-24: qty 15

## 2016-05-24 MED ORDER — METOLAZONE 2.5 MG PO TABS: 2.5000 mg | ORAL_TABLET | ORAL | Status: DC

## 2016-05-24 MED ORDER — ONDANSETRON 4 MG PO TBDP: 8.0000 mg | ORAL_TABLET | Freq: Four times a day (QID) | ORAL | Status: DC | PRN

## 2016-05-24 MED ORDER — GLYCOPYRROLATE 0.2 MG/ML IJ SOLN
0.2000 mg | INTRAMUSCULAR | Status: DC | PRN
  Administered 2016-05-24: 0.2 mg via INTRAVENOUS
  Filled 2016-05-24 (×2): qty 1

## 2016-05-24 MED ORDER — LORAZEPAM 1 MG PO TABS
1.0000 mg | ORAL_TABLET | ORAL | Status: DC | PRN
  Filled 2016-05-24: qty 1

## 2016-05-24 MED ORDER — LORAZEPAM 2 MG/ML PO CONC
1.0000 mg | ORAL | Status: DC | PRN
  Administered 2016-05-25 (×2): 1 mg via SUBLINGUAL
  Filled 2016-05-24 (×2): qty 1

## 2016-05-24 MED ORDER — FUROSEMIDE 10 MG/ML IJ SOLN
120.0000 mg | Freq: Two times a day (BID) | INTRAVENOUS | Status: DC
  Filled 2016-05-24: qty 12

## 2016-05-24 MED ORDER — GLYCOPYRROLATE 1 MG PO TABS
1.0000 mg | ORAL_TABLET | ORAL | Status: DC | PRN
  Filled 2016-05-24: qty 1

## 2016-05-24 MED ORDER — ACETAMINOPHEN 325 MG PO TABS: 650.0000 mg | ORAL_TABLET | Freq: Four times a day (QID) | ORAL | Status: DC | PRN

## 2016-05-24 NOTE — Progress Notes (Signed)
[name], Westley Hummer says that they are not sure what the discharge plan will be at this time but they are leaning toward pt going back home.  Pt is a hospice home care pt and is receiving weekly nursing visits from hospice.  [place], [place]  [phone] ext: [phone]

## 2016-05-24 NOTE — Discharge Summary (Signed)
Name: Theresa Cortez MRN: [id number] DOB: 1930-02-25 82 y.o. PCP: Gwenyth Bender, MD  Date of Admission: 05/23/2016  8:27 AM Date of Discharge: 05/25/2016 Attending Physician: Tyson Alias, MD  Discharge Diagnosis: Principal Problem:   Low output heart failure [facility]) Active Problems:   DM type 2 (diabetes mellitus, type 2) (HCC)   HLD (hyperlipidemia)   Hypothyroidism   Chronic combined systolic and diastolic congestive heart failure (HCC)   Coronary artery disease   Chronic renal insufficiency, stage IV (severe)   Interstitial lung disease (HCC)   Paroxysmal atrial fibrillation (HCC)   Hypertensive heart disease   Discharge Medications:   Medication List    STOP taking these medications   amiodarone 200 MG tablet Commonly known as:  PACERONE   benzonatate 100 MG capsule Commonly known as:  TESSALON   famotidine 20 MG tablet Commonly known as:  PEPCID   furosemide 40 MG tablet Commonly known as:  LASIX   LORazepam 0.5 MG tablet Commonly known as:  ATIVAN Replaced by:  LORazepam 2 MG/ML concentrated solution   metolazone 2.5 MG tablet Commonly known as:  ZAROXOLYN     TAKE these medications   albuterol (2.5 MG/3ML) 0.083% nebulizer solution Commonly known as:  PROVENTIL Take 2.5 mg by nebulization every 4 (four) hours as needed for wheezing or shortness of breath.   diphenhydrAMINE 25 mg capsule Commonly known as:  BENADRYL Take 1 capsule (25 mg total) by mouth every 6 (six) hours.   glycopyrrolate 1 MG tablet Commonly known as:  ROBINUL Take 1 tablet (1 mg total) by mouth every 4 (four) hours as needed (excessive secretions).   haloperidol 0.5 MG tablet Commonly known as:  HALDOL Take 1 tablet (0.5 mg total) by mouth every 4 (four) hours as needed for agitation (or delirium).   LORazepam 2 MG/ML concentrated solution Commonly known as:  ATIVAN Place 0.5 mLs (1 mg total) under the tongue every 4 (four) hours as needed for anxiety. Replaces:   LORazepam 0.5 MG tablet   morphine CONCENTRATE 10 mg / 0.5 ml concentrated solution Take 0.25-0.5 mLs (5-10 mg total) by mouth every 2 (two) hours as needed for moderate pain, severe pain, anxiety or shortness of breath (for coughing spells). What changed:  reasons to take this   ondansetron 8 MG disintegrating tablet Commonly known as:  ZOFRAN-ODT Take 1 tablet (8 mg total) by mouth every 6 (six) hours as needed for nausea.   prochlorperazine 10 MG tablet Commonly known as:  COMPAZINE Take 10 mg by mouth every 4 (four) hours as needed.       Disposition and follow-up:   TheresaTheresa Cortez was discharged from [facility] in Fair condition.  At the hospital follow up visit please address:  1.  End Stage Heart Failure: Discharged home with hospice. Please assess symptom control with morphine, ativan, zofran, robinul, haloperidol and benadryl.  2.  Labs / imaging needed at time of follow-up: None  3.  Pending labs/ test needing follow-up: None   Hospital Course by problem list: Principal Problem:   Low output heart failure (HCC) Active Problems:   DM type 2 (diabetes mellitus, type 2) (HCC)   HLD (hyperlipidemia)   Hypothyroidism   Chronic combined systolic and diastolic congestive heart failure (HCC)   Coronary artery disease   Chronic renal insufficiency, stage IV (severe)   Interstitial lung disease (HCC)   Paroxysmal atrial fibrillation (HCC)   Hypertensive heart disease   [name] is  an 82 yo female with chronic HFrEF EF 25-30% in May 2017 on metolazone and lasix at home, moderate to severe mitral valve regurgitation, moderate pulmonary HTN, CAD (LHC and RHC in March 2017), atrial fibrillation, HTN, T2DM diet controlled, and CKD IV presenting with abdominal pain.   Comfort Care for Low-Output Congestive Heart Failure: Patient presented with a vague complaint of generalized abdominal discomfort secondary to abdominal wall edema from low output heart  failure. Patient was recently treated for low output heart failure in May 2017. She presented with elevated creatinine, lactic acidosis, elevated LFTs and T. Bili and cold hands and feet. BNP was 3000. Patient was initially started on Lasix 80 mg IV BID in addition to her metolazone twice weekly as she was still taking diuretics at home. However, patient did not improve and was not a candidate for more advanced heart failure therapies given her multiple medical problems. Patient has been on Hospice since May 2017. A discussion was had with the family to continue to pursue comfort measures. Diuresis was stopped and patient was started on morphine, robinul, ativan, benadryl and haloperidol. We recommended [facility] for inpatient hospice; however, family declined and wanted to take Theresa Cortez home with home hospice. Patient was discharged to home with home hospice. If her symptoms become too cumbersome to handle, the option for [facility] remains.   Acute on CKD IV:  Creatinine elevated at 3.7 (baseline 2.7), secondary to low output heart failure.  Moderate to Severe Mitral Valve Regurgitation: 3/6 systolic murmur on examination. Not deemed a surgical candidate on previous hospitalization.   Moderate Pulmonary HTN: Secondary to heart failure.  CAD: LHC and RHC in March 2017 showed mild to moderate 3 vessel disease.   Paroxysmal Atrial Fibrillation: Patient was previously rate controlled with amiodarone. CHADVasc 5 ( for age 77, HF, HTN, DM). Not on anticoagulants. Amiodarone was discontinued for comfort measures.   T2DM Diet Controlled: 7.3 in March 2017.   Discharge Vitals:   BP (!) 92/53 (BP Location: Left Arm) Comment: RN Notified  Pulse 99   Temp 97.6 F (36.4 C) (Axillary)   Resp 16   Ht 5\' 6"  (1.676 m)   Wt 162 lb 14.7 oz (73.9 kg)   SpO2 98%   BMI 26.30 kg/m    Signed: , DO PGY-3 Internal Medicine Resident Pager # [phone] 05/25/2016 11:23 AM

## 2016-05-24 NOTE — Progress Notes (Signed)
Internal Medicine Attending:   I saw and examined the patient. I reviewed the resident's note and I agree with the resident's findings and plan as documented in the resident's note.  End-stage heart failure with reduced ejection fraction that is in a low output state causing her symptoms of abdominal discomfort and nausea. We updated her daughter at the bedside and talked directly with the heart failure service. We are going to try to better control her symptoms today with liquid morphine and Zofran. Please also consult the palliative care service. I think if we can get control over her symptoms with oral medication she could be transferred back to her daughter's house with outpatient hospice services and family support. This seems to be the patient's preference. If we cannot control her symptoms with oral medications we may have to transfer back to [facility] place for more inpatient hospice care.

## 2016-05-24 NOTE — Progress Notes (Signed)
   Subjective: Patient was seen and examined this morning. She continues to have nausea with vomiting. She denies shortness of breath.   Objective: Vital signs in last 24 hours: Vitals:   05/23/16 2100 05/24/16 0000 05/24/16 0325 05/24/16 0400  BP:  99/69 96/70   Pulse:   90   Resp: 16 16 14 14   Temp: 97.3 F (36.3 C) 97.3 F (36.3 C) 97.4 F (36.3 C)   TempSrc: Oral Oral Oral   SpO2: 100% 100% 100%   Weight:   162 lb 14.7 oz (73.9 kg)   Height:       Physical Exam General: Vital signs reviewed.  Patient is chronically ill appearing. Cardiovascular: RRR, 3/6 holosystolic murmur. Pulmonary/Chest: Decreased breath sounds on anterior auscultation. Extremities: Bilateral hands and bilateral feet are cold. 1+  lower extremity edema bilaterally  Assessment/Plan: Principal Problem:   Low output heart failure (HCC) Active Problems:   DM type 2 (diabetes mellitus, type 2) (HCC)   HLD (hyperlipidemia)   Hypothyroidism   Chronic combined systolic and diastolic congestive heart failure (HCC)   Coronary artery disease   Chronic renal insufficiency, stage IV (severe)   Interstitial lung disease (HCC)   Paroxysmal atrial fibrillation (HCC)   Hypertensive heart disease  Theresa Cortez is an 82 yo female with chronic HFrEF EF 25-30% in May 2017 on metolazone and lasix at home, moderate to severe mitral valve regurgitation, moderate pulmonary HTN, CAD (LHC and RHC in March 2017), atrial fibrillation, HTN, T2DM diet controlled, and CKD IV presenting with abdominal pain.   Low-Output Congestive Heart Failure: Elevated creatinine, lactic acidosis, elevated LFTs and T. Bili and cold hands and feet. BNP is 3000. Patient had minimal output with Lasix 80 mg IV BID in addition to her metolazone. Options are limited given her CKD V and CHF stage IV and she is not a candidate for advanced therapies including milrinone. Patient is already a hospice patient and family understands the situation. I feel the  most important thing we can do for Theresa Cortez is to keep her comfortable. Family will discuss with [facility] today whether patient will go home with hospice or to [facility] place.    Acute on CKD IV:  Creatinine elevated at 3.7>3.9 (baseline 2.7), secondary to low output heart failure. -Daily BMET -Avoid nephrotoxic medications  Moderate to Severe Mitral Valve Regurgitation: 3/6 systolic murmur on examination.  -Not deemed a surgical candidate on previous hospitalization.  Paroxysmal Atrial Fibrillation: Currently in sinus rhythm, rate controlled with amiodarone. CHADVasc 5 ( for age 21, HF, HTN, DM). Not on anticoagulants. -Amiodarone 200 mg three times weekly  HTN: Low normal. Only on amiodarone, lasix and metolazone at home.  -Monitor, continue the above  DVT/PE ppx: Heparin FEN: HH CODE: DNR  Dispo: Anticipated discharge in approximately 2 day(s).   LOS: 1 day   [name], DO PGY-3 Internal Medicine Resident Pager # [phone] 05/24/2016 6:35 AM

## 2016-05-24 NOTE — Progress Notes (Signed)
Subjective: Patient did well overnight. Some episodes of nausea/vomiting. Patient and daughters will discuss disposition today (return home with home hospice vs [facility] vs inpatient palliative care).   Objective: Vital signs in last 24 hours: Vitals:   05/24/16 0000 05/24/16 0325 05/24/16 0400 05/24/16 0821  BP: 99/69 96/70    Pulse:  90  88  Resp: 16 14 14 20   Temp: 97.3 F (36.3 C) 97.4 F (36.3 C)  97.4 F (36.3 C)  TempSrc: Oral Oral  Oral  SpO2: 100% 100%    Weight:  162 lb 14.7 oz (73.9 kg)    Height:       Weight change:   Intake/Output Summary (Last 24 hours) at 05/24/16 1034 Last data filed at 05/24/16 0512  Gross per 24 hour  Intake                0 ml  Output              450 ml  Net             -450 ml   BP 96/70 (BP Location: Left Arm)   Pulse 88   Temp 97.4 F (36.3 C) (Oral)   Resp 20   Ht 5\' 6"  (1.676 m)   Wt 162 lb 14.7 oz (73.9 kg)   SpO2 100%   BMI 26.30 kg/m    General appearance: alert, cooperative, appears stated age, fatigued and mild distress Head: Normocephalic, without obvious abnormality, atraumatic Eyes: Icteric sclerae Lungs: Diffuse inspiratory crackles Heart: regular rate and rhythm and 2/6 holosystolic ejection murmur best auscultated at the apex Abdomen: soft, non-tender; bowel sounds normal; no masses,  no organomegaly Extremities: edema 2+ pitting to the knee bilaterally Pulses: 1+ and symmetric Skin: Legs and arms warm. Hands and feet cool to the touch   Lab Results: Basic Metabolic Panel:  Recent Labs Lab 05/23/16 0845 05/24/16 0234  NA 139 137  K 3.7 4.1  CL 92* 92*  CO2 30 27  GLUCOSE 78 95  BUN 60* 57*  CREATININE 3.79* 3.92*  CALCIUM 10.3 9.8  MG  --  2.3   Liver Function Tests:  Recent Labs Lab 05/23/16 0845  AST 73*  ALT 69*  ALKPHOS 120  BILITOT 4.4*  PROT 7.2  ALBUMIN 2.9*    Recent Labs Lab 05/23/16 0845  LIPASE 32   No results for input(s): AMMONIA in the last 168  hours. CBC:  Recent Labs Lab 05/23/16 0845 05/24/16 0234  WBC 5.6 4.9  HGB 12.5 11.7*  HCT 38.2 36.7  MCV 101.6* 101.4*  PLT 137* 133*   Cardiac Enzymes:  Recent Labs Lab 05/23/16 0935  TROPONINI <0.03   BNP: No results for input(s): PROBNP in the last 168 hours. D-Dimer: No results for input(s): DDIMER in the last 168 hours. CBG:  Recent Labs Lab 05/23/16 1455  GLUCAP 111*   Hemoglobin A1C: No results for input(s): HGBA1C in the last 168 hours. Fasting Lipid Panel: No results for input(s): CHOL, HDL, LDLCALC, TRIG, CHOLHDL, LDLDIRECT in the last 168 hours. Thyroid Function Tests: No results for input(s): TSH, T4TOTAL, FREET4, T3FREE, THYROIDAB in the last 168 hours. Coagulation: No results for input(s): LABPROT, INR in the last 168 hours. Anemia Panel: No results for input(s): VITAMINB12, FOLATE, FERRITIN, TIBC, IRON, RETICCTPCT in the last 168 hours. Urine Drug Screen: Drugs of Abuse  No results found for: LABOPIA, COCAINSCRNUR, LABBENZ, AMPHETMU, THCU, LABBARB  Alcohol Level: No results for input(s): ETH in the  last 168 hours. Urinalysis:  Recent Labs Lab 05/23/16 1252  COLORURINE YELLOW  LABSPEC <1.005*  PHURINE 6.0  GLUCOSEU NEGATIVE  HGBUR NEGATIVE  BILIRUBINUR NEGATIVE  KETONESUR NEGATIVE  PROTEINUR NEGATIVE  NITRITE NEGATIVE  LEUKOCYTESUR NEGATIVE   Micro Results: Recent Results (from the past 240 hour(s))  MRSA PCR Screening     Status: None   Collection Time: 05/23/16  3:00 PM  Result Value Ref Range Status   MRSA by PCR NEGATIVE NEGATIVE Final    Comment:        The GeneXpert MRSA Assay (FDA approved for NASAL specimens only), is one component of a comprehensive MRSA colonization surveillance program. It is not intended to diagnose MRSA infection nor to guide or monitor treatment for MRSA infections.    Studies/Results: Ct Abdomen Pelvis Wo Contrast  Result Date: 05/23/2016 CLINICAL DATA:  Left upper back pain near scapula  for 2 weeks. Epigastric pain. EXAM: CT ABDOMEN AND PELVIS WITHOUT CONTRAST TECHNIQUE: Multidetector CT imaging of the abdomen and pelvis was performed following the standard protocol without IV contrast. COMPARISON:  04/09/2016 FINDINGS: Lower chest: Small left pleural effusion with left lower lobe atelectasis or scarring, stable since prior study. Mild bronchiectasis is scarring in the right lower lobe, stable. Heart is borderline in size. Hepatobiliary: High density material fills the gallbladder, similar prior study, likely related to vicarious excretion contrast. No focal hepatic abnormality. Pancreas: Is mild soft tissue stranding around the pancreas suggesting mild pancreatitis, similar to prior study. No ductal dilatation. Spleen: No focal abnormality.  Normal size. Adrenals/Urinary Tract: No adrenal abnormality. No focal renal abnormality. No stones or hydronephrosis. Urinary bladder is unremarkable. Stomach/Bowel: Stomach and small bowel as well as large bowel decompressed, grossly unremarkable. Vascular/Lymphatic: Diffuse aortic and iliac calcifications. No aneurysm. Reproductive: Uterus and adnexa unremarkable.  No mass. Other: Free fluid in the abdomen and pelvis, slightly increased in the upper abdomen since prior study. No free air. Musculoskeletal: No acute bony abnormality or focal bone lesion. IMPRESSION: Moderate free fluid in the abdomen and pelvis, slightly increased in the upper abdomen since prior study. Small left pleural effusion.  Bibasilar scarring. Slight stranding around the pancreas is similar to prior study and may be related to third spacing of fluids although acute pancreatitis cannot be excluded. Recommend clinical correlation. Electronically Signed   By: Charlett Nose M.D.   On: 05/23/2016 11:18   Dg Chest Port 1 View  Result Date: 05/23/2016 CLINICAL DATA:  Cough and left upper back pain near her scapula x 2 weeks and epigastric pain x 2 weeks with nausea. EXAM: PORTABLE CHEST 1  VIEW COMPARISON:  01/26/2016 FINDINGS: The heart is enlarged. Small left pleural effusion is present. Small amount of atelectasis identified at the left lung base. No consolidations or pulmonary edema. IMPRESSION: Stable cardiomegaly. Left lower lobe atelectasis and small effusion. Electronically Signed   By: Norva Pavlov M.D.   On: 05/23/2016 09:48   Medications: I have reviewed the patient's current medications. Scheduled Meds: . famotidine  20 mg Oral QHS  . furosemide  80 mg Intravenous BID  . heparin  5,000 Units Subcutaneous Q8H  . metolazone  2.5 mg Oral Once per day on Mon Thu  . potassium chloride  40 mEq Oral BID  . sodium chloride flush  3 mL Intravenous Q12H   Continuous Infusions:  PRN Meds:.ondansetron **OR** ondansetron (ZOFRAN) IV Assessment/Plan: Principal Problem:   Low output heart failure (HCC) Active Problems:   DM type 2 (diabetes mellitus, type 2) (  HCC)   HLD (hyperlipidemia)   Hypothyroidism   Chronic combined systolic and diastolic congestive heart failure (HCC)   Coronary artery disease   Chronic renal insufficiency, stage IV (severe)   Interstitial lung disease (HCC)   Paroxysmal atrial fibrillation (HCC)   Hypertensive heart disease  82 yo female with multiple co-morbidities presenting with vague complaints, signs of end organ damage, and lactic acidosis concerning for cardiogenic shock  1.) HFrEF: BNP 3000 at time of admission. Troponins negative. Chief complaint of vague abdominal discomfort could be related to abdominal wall edema or poor perfusion of vital organs. She was recently hospitalized in May 2017 for a similar presentation. Her weight has been stable at home and she is near her target dry weight of 160 lbs. She has been compliant with her home metolazone and Lasix 80 mg BID regimen. Concerned for poor perfusion given lactic acidosis, cold feet/hands, and elevated Cr and LFTs. Also had vague abdominal pain with N/V for the past month, likely  d/t bowel edema. Patient had lactic acid of 5.9 yesterday. Heart Failure Team agrees that this is cardiogenic shock. Patient showed minimal improvement with Lasix/Metolazone yesterday.    - Heart Failure following   - Palliative care consulted   - Discuss choice of [facility] vs Home health vs [facility] transfer for comfort care   - Discontinue Abx   - Follow-up cultures  2.) Upper Back Pain: Likely musculoskeletal as she notices it most when she has been sitting around. Also could be from her GERD. She was prescribed Pepcid but has not gotten it filled yet.   3.) Cough: Patient diagnosed with UIP by pulmonologist, last seen on 05/10/16. No work-up pursued as patient is seen as palliative.  4.) Hypotension: Has history of hypertension, has been chronically hypotensive since her last admission. Likely due to low CO heart failure.  5.) Transaminitis w/ Increased TBili: Had transaminitis at recent hospitalization. Likely d/t cardiogenic shock.  6.) AoCKD-IV: Baseline Cr is in the 2.7 range. At admission Cr is 3.79. Currently 3.92. This is likely due to poor renal perfusion from low output heart failure.    - Avoid nephrotoxic medications   - Trend Cr  7.) Paroxysmal Atrial Fibrillation: Currently in NSR. Rate controlled with amiodarone. CHADVasc score of 5 (female, age, HF, HTN, DM). Not on anticoagulants    - Continue Amiodarone 200 mg three times weekly (MWF)  DVT/VTE Prophylaxis: Heparin  FEN: Heart Healthy Code: DNR Dispo: Palliative/Comfort care  This is a Psychologist, occupational Note.  The care of the patient was discussed with Dr. Lawerance Bach and the assessment and plan formulated with their assistance.  Please see their attached note for official documentation of the daily encounter.   LOS: 1 day   Wylene Men, Medical Student 05/24/2016, 10:34 AM

## 2016-05-24 NOTE — Progress Notes (Signed)
Palliative Care Consult order noted. This is an active, covered GIP hospice admission for [facility]. Discussed patient with hospice liason. They will notify us if they need assistance from PMT; however, they do not require PMT support at this time.    Margret Chance Latessa Tillis, RN, BSN, [facility] 05/24/2016 10:27 AM Cell [phone] 8:00-4:00 Monday-Friday Office [phone]

## 2016-05-24 NOTE — Progress Notes (Signed)
Advanced Heart Failure Rounding Note  Referring Physician: Dr. Oswaldo Done Primary Physician: Gwenyth Bender, MD Primary Cardiologist:  Dr. Jomarie Longs  Reason for Consultation: A/C systolic CHF / Suspected Low output.   Subjective:    Admitted from [facility] 05/23/16 when daughter brought in for abdominal pain/generalized weakness.  Found to have recurrent low output.  Pt is Hospice patient.   Still having mild abdominal pain. Tired.  Denies discomfort or pain.  Denies pain or lightheadedness.   Minimal urine output on 80 lasix with metolazone. Weight up and creatinine worse.    Objective:   Weight Range: 162 lb 14.7 oz (73.9 kg) Body mass index is 26.3 kg/m.   Vital Signs:   Temp:  [96.9 F (36.1 C)-97.4 F (36.3 C)] 97.4 F (36.3 C) (09/13 0325) Pulse Rate:  [77-112] 90 (09/13 0325) Resp:  [13-22] 14 (09/13 0400) BP: (87-184)/(58-145) 96/70 (09/13 0325) SpO2:  [93 %-100 %] 100 % (09/13 0325) Weight:  [161 lb 13.1 oz (73.4 kg)-162 lb 14.7 oz (73.9 kg)] 162 lb 14.7 oz (73.9 kg) (09/13 0325)    Weight change: Filed Weights   05/23/16 1430 05/24/16 0325  Weight: 161 lb 13.1 oz (73.4 kg) 162 lb 14.7 oz (73.9 kg)    Intake/Output:   Intake/Output Summary (Last 24 hours) at 05/24/16 0743 Last data filed at 05/24/16 1610  Gross per 24 hour  Intake                0 ml  Output              450 ml  Net             -450 ml     Physical Exam: General: Elderly and chronically ill appearing.  Fatigued.  No acute distress HEENT: normal Neck: supple. JVP elevated . Carotids 2+ bilat; no bruits. No lymphadenopathy or thyromegaly appreciated. Cor: PMI nondisplaced. Regular.  +S3.  Lungs: Diminished throughout.  Abdomen: soft, mild diffuse tenderness, ND, no HSM. No bruits or masses. +BS  Extremities: no cyanosis, clubbing, rash, 2-3+ peripheral edema, extremities cold to the touch. + asterixis Neuro: alert & oriented to person and place. cranial nerves grossly intact. moves all 4  extremities w/o difficulty. Affect flat.   Telemetry: Reviewed, NSR  Labs: CBC  Recent Labs  05/23/16 0845 05/24/16 0234  WBC 5.6 4.9  HGB 12.5 11.7*  HCT 38.2 36.7  MCV 101.6* 101.4*  PLT 137* 133*   Basic Metabolic Panel  Recent Labs  05/23/16 0845 05/24/16 0234  NA 139 137  K 3.7 4.1  CL 92* 92*  CO2 30 27  GLUCOSE 78 95  BUN 60* 57*  CREATININE 3.79* 3.92*  CALCIUM 10.3 9.8  MG  --  2.3   Liver Function Tests  Recent Labs  05/23/16 0845  AST 73*  ALT 69*  ALKPHOS 120  BILITOT 4.4*  PROT 7.2  ALBUMIN 2.9*    Recent Labs  05/23/16 0845  LIPASE 32   Cardiac Enzymes  Recent Labs  05/23/16 0935  TROPONINI <0.03    BNP: BNP (last 3 results)  Recent Labs  01/15/16 0523 01/24/16 2314 05/23/16 0935  BNP 1,386.8* 2,194.5* 3,001.9*    ProBNP (last 3 results) No results for input(s): PROBNP in the last 8760 hours.   D-Dimer No results for input(s): DDIMER in the last 72 hours. Hemoglobin A1C No results for input(s): HGBA1C in the last 72 hours. Fasting Lipid Panel No results for input(s): CHOL, HDL,  LDLCALC, TRIG, CHOLHDL, LDLDIRECT in the last 72 hours. Thyroid Function Tests No results for input(s): TSH, T4TOTAL, T3FREE, THYROIDAB in the last 72 hours.  Invalid input(s): FREET3  Other results:     Imaging/Studies:  Ct Abdomen Pelvis Wo Contrast  Result Date: 05/23/2016 CLINICAL DATA:  Left upper back pain near scapula for 2 weeks. Epigastric pain. EXAM: CT ABDOMEN AND PELVIS WITHOUT CONTRAST TECHNIQUE: Multidetector CT imaging of the abdomen and pelvis was performed following the standard protocol without IV contrast. COMPARISON:  04/09/2016 FINDINGS: Lower chest: Small left pleural effusion with left lower lobe atelectasis or scarring, stable since prior study. Mild bronchiectasis is scarring in the right lower lobe, stable. Heart is borderline in size. Hepatobiliary: High density material fills the gallbladder, similar prior  study, likely related to vicarious excretion contrast. No focal hepatic abnormality. Pancreas: Is mild soft tissue stranding around the pancreas suggesting mild pancreatitis, similar to prior study. No ductal dilatation. Spleen: No focal abnormality.  Normal size. Adrenals/Urinary Tract: No adrenal abnormality. No focal renal abnormality. No stones or hydronephrosis. Urinary bladder is unremarkable. Stomach/Bowel: Stomach and small bowel as well as large bowel decompressed, grossly unremarkable. Vascular/Lymphatic: Diffuse aortic and iliac calcifications. No aneurysm. Reproductive: Uterus and adnexa unremarkable.  No mass. Other: Free fluid in the abdomen and pelvis, slightly increased in the upper abdomen since prior study. No free air. Musculoskeletal: No acute bony abnormality or focal bone lesion. IMPRESSION: Moderate free fluid in the abdomen and pelvis, slightly increased in the upper abdomen since prior study. Small left pleural effusion.  Bibasilar scarring. Slight stranding around the pancreas is similar to prior study and may be related to third spacing of fluids although acute pancreatitis cannot be excluded. Recommend clinical correlation. Electronically Signed   By: Charlett Nose M.D.   On: 05/23/2016 11:18   Dg Chest Port 1 View  Result Date: 05/23/2016 CLINICAL DATA:  Cough and left upper back pain near her scapula x 2 weeks and epigastric pain x 2 weeks with nausea. EXAM: PORTABLE CHEST 1 VIEW COMPARISON:  01/26/2016 FINDINGS: The heart is enlarged. Small left pleural effusion is present. Small amount of atelectasis identified at the left lung base. No consolidations or pulmonary edema. IMPRESSION: Stable cardiomegaly. Left lower lobe atelectasis and small effusion. Electronically Signed   By: Norva Pavlov M.D.   On: 05/23/2016 09:48     Latest Echo  Latest Cath   Medications:     Scheduled Medications: . famotidine  20 mg Oral QHS  . furosemide  80 mg Intravenous BID  . heparin   5,000 Units Subcutaneous Q8H  . metolazone  2.5 mg Oral Once per day on Mon Thu  . potassium chloride  40 mEq Oral BID  . sodium chloride flush  3 mL Intravenous Q12H     Infusions:     PRN Medications:  ondansetron **OR** ondansetron (ZOFRAN) IV   Assessment   1. Cardiogenic shock with multiple organ failure 2. Acute on chronic End stage CHF 3. Lactic acidosis 4. ARF on CKD stage IV 5. Hypotension 6. Elevated AST/ALT 7. Severe MR/TR 8. Hypoalbuniemia - Albumin 2.9 9. Hypothroidism 10. DM II  Plan    No improvement overnight.   No SOB currently, so will not increase lasix.   Will continue to recommend comfort measures.  Will not pursue aggressive measures or escalate care.   Length of Stay: 1   Graciella Freer PA-C 05/24/2016, 7:43 AM  Advanced Heart Failure Team Pager [phone] (M-F; 7a -  4p)  Please contact [facility] for night-coverage after hours (4p -7a ) and weekends on [email or web address]  Patient seen and examined with Otilio Saber, PA-C. We discussed all aspects of the encounter. I agree with the assessment and plan as stated above.   Patient seen and examined with Otilio Saber, PA-C. We discussed all aspects of the encounter. I agree with the assessment and plan as stated above.   No improvement with IV lasix. Renal function worse. She has end-stage HF with multi-system organ failure. She is not a candidate for home inotropes or advanced therapies. She is clearly at the end of her life. Would recommend transfer to [facility] or to [facility] for comfort care. Discussed with her and her daughter as well as IMTS at bedside. We will continue to follow along as needed.   Nadelyn Enriques,MD 8:35 AM

## 2016-05-24 NOTE — Progress Notes (Signed)
Palliative Medicine RN Note: Palliative Medicine consult noted. Patient is active with [facility]. Spoke with their Graylon Gunning; she will have someone come see pt today for possible transfer to [facility]. Of note, [facility] was not aware of pt's admit (neither family nor hospital staff notified them).  PMT will wait for this to be completed, as we do not usually see patients who are enrolled in hospice care.  Margret Chance Mariadejesus Cade, RN, BSN, [facility] 05/24/2016 8:34 AM Cell [phone] 8:00-4:00 Monday-Friday Office [phone]

## 2016-05-24 NOTE — Progress Notes (Signed)
[facility]- 2H - 17 [facility] RN visit   This is a GIP, related and covered admission of 05/23/16 per Dr. Gibson Ramp with a [facility]  diagnosis of Heart failure.   Patient transferred from home to [facility] ED yesterday, 9/12 due to epigastric pain, nausea with vomiting, and back pain.  [facility] was not notified.  She is admitted with Heart failure.   Visited patient in room. Daughter Westley Hummer present.   Patient is alert, oriented but lethagic. She is short of breath with a moist congested cough.  02 in place. Patient denies pain at present.  Lower extremity edema is present.    Spoke with daughter regarding goals and whether or not they were considering [employer] again. Daughter states that it is the patient's decision as to what she wants, but that they feel comfortable and capable caring for her at home with hospice support.   Patient initially did receive some IVF in the ED and started on IV Vancomycin which has now been discontinued.  She has received Robinul IV x 1 for congestion, Roxanol 5mg  po x 1 for pain/dyspnea and Zofran 4mg  IV for nausea.   Patient did also receive IV Lasix.   A foley catheter has been placed.    She is being converted to oral medications.  IVF have been stopped.    [facility] will continue to follow and anticipate any discharge needs/concerns.   Please call with any questions   , RN  [phone]  [facility] Liaison

## 2016-05-25 MED ORDER — LORAZEPAM 2 MG/ML PO CONC: 1.0000 mg | ORAL | 0 refills | Status: AC | PRN

## 2016-05-25 MED ORDER — DIPHENHYDRAMINE HCL 25 MG PO CAPS
25.0000 mg | ORAL_CAPSULE | Freq: Four times a day (QID) | ORAL | Status: DC | PRN
  Administered 2016-05-25: 25 mg via ORAL
  Filled 2016-05-25: qty 1

## 2016-05-25 MED ORDER — HALOPERIDOL 0.5 MG PO TABS: 0.5000 mg | ORAL_TABLET | ORAL | 1 refills | Status: AC | PRN

## 2016-05-25 MED ORDER — ONDANSETRON 8 MG PO TBDP: 8.0000 mg | ORAL_TABLET | Freq: Four times a day (QID) | ORAL | 0 refills | Status: AC | PRN

## 2016-05-25 MED ORDER — GLYCOPYRROLATE 1 MG PO TABS: 1.0000 mg | ORAL_TABLET | ORAL | 1 refills | Status: AC | PRN

## 2016-05-25 MED ORDER — MORPHINE SULFATE (CONCENTRATE) 10 MG /0.5 ML PO SOLN: 5.0000 mg | ORAL | 0 refills | Status: AC | PRN

## 2016-05-25 MED ORDER — DIPHENHYDRAMINE HCL 25 MG PO CAPS: 25.0000 mg | ORAL_CAPSULE | Freq: Four times a day (QID) | ORAL | 0 refills | Status: AC

## 2016-05-25 NOTE — Care Management Note (Signed)
Case Management Note  Patient Details  Name: LYNDZEE KLIEBERT MRN: [id number] Date of Birth: 06-24-1930  Subjective/Objective:                    Action/Plan:  Spoke with daughter at bedside , she wants to take her mother home with [facility] ( continuation of care )  . Patient already has oxygen at home. Daughter requesting ambulance transport home to [address] , [employer] .   See Reed Breech' note. Expected Discharge Date:                  Expected Discharge Plan:  Home w Hospice Care  In-House Referral:     Discharge planning Services  CM Consult  Post Acute Care Choice:  Resumption of Svcs/PTA Provider Choice offered to:  Adult Children  DME Arranged:    DME Agency:     HH Arranged:  RN, Social Work [employer] Agency:  [facility] [place]  Status of Service:  Completed, signed off  If discussed at [employer] [facility] [employer], dates discussed:    Additional Comments:  Kingsley Plan, RN 05/25/2016, 2:33 PM

## 2016-05-25 NOTE — Progress Notes (Signed)
Subjective: Patient did well overnight. Some episodes of nausea/vomiting. Patient was slightly agitated, trying to get out of bed, was controlled with morphine, haloperidol, and lorazepam. This morning patient was resting in bed comfortably with her daughters present. Family leaning towards discharge to home with hospice, but still discussing possibility of [facility].  Objective: Vital signs in last 24 hours: Vitals:   05/24/16 0821 05/24/16 1059 05/24/16 1309 05/24/16 2230  BP: 98/71 94/73 104/61 (!) 92/53  Pulse: 88 85 82 99  Resp: 20 16 16 16   Temp: 97.4 F (36.3 C) 97.5 F (36.4 C) 97.6 F (36.4 C) 97.6 F (36.4 C)  TempSrc: Oral Oral Oral Axillary  SpO2: 98% 100% 100% 98%  Weight:      Height:       Weight change:   Intake/Output Summary (Last 24 hours) at 05/25/16 1036 Last data filed at 05/25/16 1022  Gross per 24 hour  Intake              240 ml  Output              650 ml  Net             -410 ml   BP (!) 92/53 (BP Location: Left Arm) Comment: RN Notified  Pulse 99   Temp 97.6 F (36.4 C) (Axillary)   Resp 16   Ht 5\' 6"  (1.676 m)   Wt 162 lb 14.7 oz (73.9 kg)   SpO2 98%   BMI 26.30 kg/m    General appearance: appears stated age, delirious, fatigued and mild distress Head: Normocephalic, without obvious abnormality, atraumatic Eyes: Icteric sclerae Lungs: Diffuse inspiratory crackles Heart: regular rate and rhythm and 2/6 holosystolic ejection murmur best auscultated at the apex Abdomen: soft, non-tender; bowel sounds normal; no masses,  no organomegaly Extremities: edema 2+ pitting to the knee bilaterally Pulses: 1+ and symmetric Skin: Legs and arms warm. Hands and feet cool to the touch   Lab Results: Basic Metabolic Panel:  Recent Labs Lab 05/23/16 0845 05/24/16 0234  NA 139 137  K 3.7 4.1  CL 92* 92*  CO2 30 27  GLUCOSE 78 95  BUN 60* 57*  CREATININE 3.79* 3.92*  CALCIUM 10.3 9.8  MG  --  2.3   Liver Function Tests:  Recent  Labs Lab 05/23/16 0845  AST 73*  ALT 69*  ALKPHOS 120  BILITOT 4.4*  PROT 7.2  ALBUMIN 2.9*    Recent Labs Lab 05/23/16 0845  LIPASE 32   No results for input(s): AMMONIA in the last 168 hours. CBC:  Recent Labs Lab 05/23/16 0845 05/24/16 0234  WBC 5.6 4.9  HGB 12.5 11.7*  HCT 38.2 36.7  MCV 101.6* 101.4*  PLT 137* 133*   Cardiac Enzymes:  Recent Labs Lab 05/23/16 0935  TROPONINI <0.03   BNP: No results for input(s): PROBNP in the last 168 hours. D-Dimer: No results for input(s): DDIMER in the last 168 hours. CBG:  Recent Labs Lab 05/23/16 1455  GLUCAP 111*   Hemoglobin A1C: No results for input(s): HGBA1C in the last 168 hours. Fasting Lipid Panel: No results for input(s): CHOL, HDL, LDLCALC, TRIG, CHOLHDL, LDLDIRECT in the last 168 hours. Thyroid Function Tests: No results for input(s): TSH, T4TOTAL, FREET4, T3FREE, THYROIDAB in the last 168 hours. Coagulation: No results for input(s): LABPROT, INR in the last 168 hours. Anemia Panel: No results for input(s): VITAMINB12, FOLATE, FERRITIN, TIBC, IRON, RETICCTPCT in the last 168 hours. Urine Drug Screen: Drugs  of Abuse  No results found for: LABOPIA, COCAINSCRNUR, LABBENZ, AMPHETMU, THCU, LABBARB  Alcohol Level: No results for input(s): ETH in the last 168 hours. Urinalysis:  Recent Labs Lab 05/23/16 1252  COLORURINE YELLOW  LABSPEC <1.005*  PHURINE 6.0  GLUCOSEU NEGATIVE  HGBUR NEGATIVE  BILIRUBINUR NEGATIVE  KETONESUR NEGATIVE  PROTEINUR NEGATIVE  NITRITE NEGATIVE  LEUKOCYTESUR NEGATIVE   Micro Results: Recent Results (from the past 240 hour(s))  Blood Culture (routine x 2)     Status: None (Preliminary result)   Collection Time: 05/23/16  9:35 AM  Result Value Ref Range Status   Specimen Description BLOOD RIGHT ARM  Final   Special Requests BOTTLES DRAWN AEROBIC ONLY 6CC  Final   Culture NO GROWTH 1 DAY  Final   Report Status PENDING  Incomplete  Blood Culture (routine x 2)      Status: None (Preliminary result)   Collection Time: 05/23/16  9:45 AM  Result Value Ref Range Status   Specimen Description BLOOD WRIST  Final   Special Requests IN PEDIATRIC BOTTLE  3CC  Final   Culture NO GROWTH 1 DAY  Final   Report Status PENDING  Incomplete  Urine culture     Status: None   Collection Time: 05/23/16 12:52 PM  Result Value Ref Range Status   Specimen Description URINE, CATHETERIZED  Final   Special Requests NONE  Final   Culture NO GROWTH  Final   Report Status 05/24/2016 FINAL  Final  MRSA PCR Screening     Status: None   Collection Time: 05/23/16  3:00 PM  Result Value Ref Range Status   MRSA by PCR NEGATIVE NEGATIVE Final    Comment:        The GeneXpert MRSA Assay (FDA approved for NASAL specimens only), is one component of a comprehensive MRSA colonization surveillance program. It is not intended to diagnose MRSA infection nor to guide or monitor treatment for MRSA infections.    Studies/Results: Ct Abdomen Pelvis Wo Contrast  Result Date: 05/23/2016 CLINICAL DATA:  Left upper back pain near scapula for 2 weeks. Epigastric pain. EXAM: CT ABDOMEN AND PELVIS WITHOUT CONTRAST TECHNIQUE: Multidetector CT imaging of the abdomen and pelvis was performed following the standard protocol without IV contrast. COMPARISON:  04/09/2016 FINDINGS: Lower chest: Small left pleural effusion with left lower lobe atelectasis or scarring, stable since prior study. Mild bronchiectasis is scarring in the right lower lobe, stable. Heart is borderline in size. Hepatobiliary: High density material fills the gallbladder, similar prior study, likely related to vicarious excretion contrast. No focal hepatic abnormality. Pancreas: Is mild soft tissue stranding around the pancreas suggesting mild pancreatitis, similar to prior study. No ductal dilatation. Spleen: No focal abnormality.  Normal size. Adrenals/Urinary Tract: No adrenal abnormality. No focal renal abnormality. No stones or  hydronephrosis. Urinary bladder is unremarkable. Stomach/Bowel: Stomach and small bowel as well as large bowel decompressed, grossly unremarkable. Vascular/Lymphatic: Diffuse aortic and iliac calcifications. No aneurysm. Reproductive: Uterus and adnexa unremarkable.  No mass. Other: Free fluid in the abdomen and pelvis, slightly increased in the upper abdomen since prior study. No free air. Musculoskeletal: No acute bony abnormality or focal bone lesion. IMPRESSION: Moderate free fluid in the abdomen and pelvis, slightly increased in the upper abdomen since prior study. Small left pleural effusion.  Bibasilar scarring. Slight stranding around the pancreas is similar to prior study and may be related to third spacing of fluids although acute pancreatitis cannot be excluded. Recommend clinical correlation. Electronically Signed  By: Charlett Nose M.D.   On: 05/23/2016 11:18   Medications: I have reviewed the patient's current medications. Scheduled Meds: . famotidine  20 mg Oral QHS  . sodium chloride flush  3 mL Intravenous Q12H   Continuous Infusions:  PRN Meds:.acetaminophen **OR** acetaminophen, antiseptic oral rinse, diphenhydrAMINE, glycopyrrolate **OR** glycopyrrolate **OR** glycopyrrolate, haloperidol **OR** haloperidol **OR** haloperidol lactate, LORazepam **OR** LORazepam **OR** LORazepam, morphine injection, morphine CONCENTRATE, ondansetron **OR** ondansetron (ZOFRAN) IV, polyvinyl alcohol Assessment/Plan: Principal Problem:   Low output heart failure (HCC) Active Problems:   DM type 2 (diabetes mellitus, type 2) (HCC)   HLD (hyperlipidemia)   Hypothyroidism   Chronic combined systolic and diastolic congestive heart failure (HCC)   Coronary artery disease   Chronic renal insufficiency, stage IV (severe)   Interstitial lung disease (HCC)   Paroxysmal atrial fibrillation (HCC)   Hypertensive heart disease  82 yo female with multiple co-morbidities presenting with vague complaints,  signs of end organ damage, and lactic acidosis concerning for cardiogenic shock  1.) HFrEF: BNP 3000 at time of admission. Troponins negative. Chief complaint of vague abdominal discomfort could be related to abdominal wall edema or poor perfusion of vital organs. She was recently hospitalized in May 2017 for a similar presentation. Her weight has been stable at home and she is near her target dry weight of 160 lbs. She has been compliant with her home metolazone and Lasix 80 mg BID regimen. Concerned for poor perfusion given lactic acidosis, cold feet/hands, and elevated Cr and LFTs. Also had vague abdominal pain with N/V for the past month, likely d/t bowel edema. Patient had lactic acid of 5.9 yesterday. Heart Failure Team agrees that this is cardiogenic shock. Patient showed minimal improvement with Lasix/Metolazone yesterday.    - Heart Failure following   - Palliative care consulted   - Discuss choice of [facility] vs Home health   - Discontinue Abx   - Follow-up cultures  2.) Upper Back Pain: Likely musculoskeletal as she notices it most when she has been sitting around. Also could be from her GERD. She was prescribed Pepcid but has not gotten it filled yet.   3.) Cough: Patient diagnosed with UIP by pulmonologist, last seen on 05/10/16. No work-up pursued as patient is seen as palliative.  4.) Hypotension: Has history of hypertension, has been chronically hypotensive since her last admission. Likely due to low CO heart failure.  5.) Transaminitis w/ Increased TBili: Had transaminitis at recent hospitalization. Likely d/t cardiogenic shock.  6.) AoCKD-IV: Baseline Cr is in the 2.7 range. At admission Cr is 3.79. Currently 3.92. This is likely due to poor renal perfusion from low output heart failure.    - Avoid nephrotoxic medications   - Trend Cr  7.) Paroxysmal Atrial Fibrillation: Currently in NSR. Rate controlled with amiodarone. CHADVasc score of 5 (female, age, HF, HTN, DM).  Not on anticoagulants    - Continue Amiodarone 200 mg three times weekly (MWF)  DVT/VTE Prophylaxis: Heparin  FEN: Heart Healthy Code: DNR Dispo: Palliative/Comfort care  This is a Psychologist, occupational Note.  The care of the patient was discussed with Dr. Lawerance Bach and the assessment and plan formulated with their assistance.  Please see their attached note for official documentation of the daily encounter.   LOS: 2 days   Wylene Men, Medical Student 05/25/2016, 10:36 AM

## 2016-05-25 NOTE — Progress Notes (Signed)
[facility] - [facility]N-[facility] RN Note.  This is a GIP, related and covered admission of 05/23/16 per Dr. Gibson Ramp with a [facility]  diagnosis of Heart failure. Code status is DNR.   Patient was moved yesterday from 2H to 6N.   Visited patient in her room. Daughter Westley Hummer is present.  The patient is lethargic but noted to be restless in the bed. Daughter reports she has been having itching and restlessness throughout the night.   Discussed with Westley Hummer what their goals/plans are and she again states she would like to try to bring her home.  Discussed with her that some of the symptoms she is seeing are frequent symptoms seen at end of life.  Daughter was also given [facility] Booklet to review.  She did indicate an understanding that patient is beginning to transition toward end of life.   The patient has received two doses of Roxanol 0.5mg  for dyspnea as well as Ativan 1mg  x2, haldol 0.5mg   X 1 for restlessness and Benadryl po for itching.    She was able to take the Benadryl with some ice cream.   Patient does have a foley catheter and this was also discussed with daughter; encouraging her to allow patient to come home with catheter in place.  I did emphasize to her that if things become too difficult at home that [facility] can assist with transferring her to [facility].    [facility] will continue to follow and anticipate any discharge needs.   [facility], RN  [facility] Liaison  [phone]

## 2016-05-25 NOTE — Progress Notes (Addendum)
   Subjective: Patient was seen and examined this morning. She is resting in bed quietly. Overnight, patient was restless, but was eventually able to go to sleep after administration of morphine orally, haldol and ativan. We had a discussion with [facility] daughter along with the Hospice RN who was present regarding prognosis and clinical course moving forward. Patient is likely in the last few days of her life. Family is very resistant to [facility] and wants patient to go home with Hospice. Hospice confirmed that if the patient's symptoms become too much for her to handle, patient can be transferred to [facility].   Objective: Vital signs in last 24 hours: Vitals:   05/24/16 0821 05/24/16 1059 05/24/16 1309 05/24/16 2230  BP: 98/71 94/73 104/61 (!) 92/53  Pulse: 88 85 82 99  Resp: 20 16 16 16   Temp: 97.4 F (36.3 C) 97.5 F (36.4 C) 97.6 F (36.4 C) 97.6 F (36.4 C)  TempSrc: Oral Oral Oral Axillary  SpO2: 98% 100% 100% 98%  Weight:      Height:       Physical Exam General: Vital signs reviewed. Patient is chronically ill appearing. Cardiovascular: RRR, 3/6 holosystolic murmur. Pulmonary/Chest:Decreased breath sounds on anterior auscultation. Extremities: 1+ lower extremity edemabilaterally  Assessment/Plan: Principal Problem:   Low output heart failure (HCC) Active Problems:   DM type 2 (diabetes mellitus, type 2) (HCC)   HLD (hyperlipidemia)   Hypothyroidism   Chronic combined systolic and diastolic congestive heart failure (HCC)   Coronary artery disease   Chronic renal insufficiency, stage IV (severe)   Interstitial lung disease (HCC)   Paroxysmal atrial fibrillation (HCC)   Hypertensive heart disease  Theresa Cortez is an 82 yo female with chronic HFrEF EF 25-30% in May 2017 on metolazone and lasix at home, moderate to severe mitral valve regurgitation, moderate pulmonary HTN, CAD (LHC and RHC in March 2017), atrial fibrillation, HTN, T2DM diet controlled, and  CKD IV presenting with abdominal pain.   Low-Output Congestive Heart Failure: Patient is on Home Hospice prior to admission. [facility] saw patient and family in the [facility] yesterday and offered them [facility] or home hospice again. Family was still deciding as of yesterday afternoon, but they have decided to return home with hospice. Family desires to take patient home for a home death with family and is resistant to [facility]. We discussed the additional resources at [facility] including IV medications and gtts, which family understands, but would still rather take her home. Hospice confirmed that if the patient's symptoms become too much for her to handle, patient can be transferred to [facility].  -Discharge to home with home hospice today -Robinul for secretions -Haldol for agitation, delirium and nausea -Ativan for anxiety -Morphine oral solution for pain, SOB or coughing spells -Benadryl 25 mg Q6H   Acute on CKD IV: Creatinine elevated at 3.7>3.9 (baseline 2.7), secondary to low output heart failure.  Paroxysmal Atrial Fibrillation: Discontinued amiodarone for comfort measures. This medication has made patient feel poorly in the past.   DVT/PE ppx: None, on Comfort Care FEN: HH CODE: DNR  Dispo: Anticipated discharge in approximately 0 day(s).   LOS: 2 days   [facility], DO PGY-3 Internal Medicine Resident Pager # [phone] 05/25/2016 10:48 AM

## 2016-05-25 NOTE — Progress Notes (Signed)
Advanced Heart Failure Rounding Note  Referring Physician: Dr. Oswaldo Done Primary Physician: Gwenyth Bender, MD Primary Cardiologist:  Dr. Jomarie Longs  Reason for Consultation: A/C systolic CHF / Suspected Low output.   Subjective:    Admitted from [facility] 05/23/16 when daughter brought in for abdominal pain/generalized weakness.  Found to have recurrent low output.  Pt is Hospice patient.   Pt had no improved on IV diuresis and family and pt agreed with continuation of Hospice/transition to comfort care.  Their goal is to get her home with hospice care.   Pt resting comfortable this am. Per daughter in room had a restless night.    No labs/I/Os/weights/telemetry with comfort care.   Objective:   Weight Range: 162 lb 14.7 oz (73.9 kg) Body mass index is 26.3 kg/m.   Vital Signs:   Temp:  [97.4 F (36.3 C)-97.6 F (36.4 C)] 97.6 F (36.4 C) (09/13 2230) Pulse Rate:  [82-99] 99 (09/13 2230) Resp:  [16-20] 16 (09/13 2230) BP: (92-104)/(53-73) 92/53 (09/13 2230) SpO2:  [98 %-100 %] 98 % (09/13 2230)    Weight change: Filed Weights   05/23/16 1430 05/24/16 0325  Weight: 161 lb 13.1 oz (73.4 kg) 162 lb 14.7 oz (73.9 kg)    Intake/Output:   Intake/Output Summary (Last 24 hours) at 05/25/16 0729 Last data filed at 05/25/16 0630  Gross per 24 hour  Intake              300 ml  Output              650 ml  Net             -350 ml     Physical Exam: General: Elderly and chronically ill appearing.  Fatigued.  No acute distress. Sleeping HEENT: normal Neck: supple. JVP elevated . Carotids 2+ bilat; no bruits. No lymphadenopathy or thyromegaly appreciated. Cor: PMI nondisplaced. Regular.  +S3.  Lungs: Diminished throughout.  Abdomen: soft, ND, no HSM. No bruits or masses. +BS  Extremities: no cyanosis, clubbing, rash, 2-3+ peripheral edema, extremities cold to the touch.  Neuro: Sleeping currently.   Labs: CBC  Recent Labs  05/23/16 0845 05/24/16 0234  WBC 5.6 4.9  HGB  12.5 11.7*  HCT 38.2 36.7  MCV 101.6* 101.4*  PLT 137* 133*   Basic Metabolic Panel  Recent Labs  05/23/16 0845 05/24/16 0234  NA 139 137  K 3.7 4.1  CL 92* 92*  CO2 30 27  GLUCOSE 78 95  BUN 60* 57*  CREATININE 3.79* 3.92*  CALCIUM 10.3 9.8  MG  --  2.3   Liver Function Tests  Recent Labs  05/23/16 0845  AST 73*  ALT 69*  ALKPHOS 120  BILITOT 4.4*  PROT 7.2  ALBUMIN 2.9*    Recent Labs  05/23/16 0845  LIPASE 32   Cardiac Enzymes  Recent Labs  05/23/16 0935  TROPONINI <0.03    BNP: BNP (last 3 results)  Recent Labs  01/15/16 0523 01/24/16 2314 05/23/16 0935  BNP 1,386.8* 2,194.5* 3,001.9*    ProBNP (last 3 results) No results for input(s): PROBNP in the last 8760 hours.   D-Dimer No results for input(s): DDIMER in the last 72 hours. Hemoglobin A1C No results for input(s): HGBA1C in the last 72 hours. Fasting Lipid Panel No results for input(s): CHOL, HDL, LDLCALC, TRIG, CHOLHDL, LDLDIRECT in the last 72 hours. Thyroid Function Tests No results for input(s): TSH, T4TOTAL, T3FREE, THYROIDAB in the last 72 hours.  Invalid input(s): FREET3  Other results:     Imaging/Studies:  Ct Abdomen Pelvis Wo Contrast  Result Date: 05/23/2016 CLINICAL DATA:  Left upper back pain near scapula for 2 weeks. Epigastric pain. EXAM: CT ABDOMEN AND PELVIS WITHOUT CONTRAST TECHNIQUE: Multidetector CT imaging of the abdomen and pelvis was performed following the standard protocol without IV contrast. COMPARISON:  04/09/2016 FINDINGS: Lower chest: Small left pleural effusion with left lower lobe atelectasis or scarring, stable since prior study. Mild bronchiectasis is scarring in the right lower lobe, stable. Heart is borderline in size. Hepatobiliary: High density material fills the gallbladder, similar prior study, likely related to vicarious excretion contrast. No focal hepatic abnormality. Pancreas: Is mild soft tissue stranding around the pancreas  suggesting mild pancreatitis, similar to prior study. No ductal dilatation. Spleen: No focal abnormality.  Normal size. Adrenals/Urinary Tract: No adrenal abnormality. No focal renal abnormality. No stones or hydronephrosis. Urinary bladder is unremarkable. Stomach/Bowel: Stomach and small bowel as well as large bowel decompressed, grossly unremarkable. Vascular/Lymphatic: Diffuse aortic and iliac calcifications. No aneurysm. Reproductive: Uterus and adnexa unremarkable.  No mass. Other: Free fluid in the abdomen and pelvis, slightly increased in the upper abdomen since prior study. No free air. Musculoskeletal: No acute bony abnormality or focal bone lesion. IMPRESSION: Moderate free fluid in the abdomen and pelvis, slightly increased in the upper abdomen since prior study. Small left pleural effusion.  Bibasilar scarring. Slight stranding around the pancreas is similar to prior study and may be related to third spacing of fluids although acute pancreatitis cannot be excluded. Recommend clinical correlation. Electronically Signed   By: Charlett Nose M.D.   On: 05/23/2016 11:18   Dg Chest Port 1 View  Result Date: 05/23/2016 CLINICAL DATA:  Cough and left upper back pain near her scapula x 2 weeks and epigastric pain x 2 weeks with nausea. EXAM: PORTABLE CHEST 1 VIEW COMPARISON:  01/26/2016 FINDINGS: The heart is enlarged. Small left pleural effusion is present. Small amount of atelectasis identified at the left lung base. No consolidations or pulmonary edema. IMPRESSION: Stable cardiomegaly. Left lower lobe atelectasis and small effusion. Electronically Signed   By: Norva Pavlov M.D.   On: 05/23/2016 09:48    Latest Echo  Latest Cath   Medications:     Scheduled Medications: . famotidine  20 mg Oral QHS  . sodium chloride flush  3 mL Intravenous Q12H    Infusions:    PRN Medications: acetaminophen **OR** acetaminophen, antiseptic oral rinse, glycopyrrolate **OR** glycopyrrolate **OR**  glycopyrrolate, haloperidol **OR** haloperidol **OR** haloperidol lactate, LORazepam **OR** LORazepam **OR** LORazepam, morphine injection, morphine CONCENTRATE, ondansetron **OR** ondansetron (ZOFRAN) IV, polyvinyl alcohol   Assessment   1. Cardiogenic shock with multiple organ failure 2. Acute on chronic End stage CHF 3. Lactic acidosis 4. ARF on CKD stage IV 5. Hypotension 6. Elevated AST/ALT 7. Severe MR/TR 8. Hypoalbuniemia - Albumin 2.9 9. Hypothroidism 10. DM II  Plan    Pt resting comfortable currently.   Per family had restless night.   If pt has more restlessness/discomfort today, would strongly urge transfer to [facility].   Likely patient has a very short prognosis, suspect she will become more uncomfortable on po meds in the coming days.   Transfer home should only be pursued if hospice confirms IV comfort medications will be readily available for patient on discharge. Would avoid hospitalization moving forward.  Length of Stay: 2   Graciella Freer PA-C 05/25/2016, 7:29 AM  Advanced Heart Failure Team Pager  [phone] (M-F; 7a - 4p)  Please contact [facility] for night-coverage after hours (4p -7a ) and weekends on [email or web address]  Patient seen and examined with Otilio Saber, PA-C. We discussed all aspects of the encounter. I agree with the assessment and plan as stated above.   Agree with above. She is clearly in the last few days/week of her life. She is restless and uncomfortable at times. Strongly urge starting morphine and versed gtts for comfort and transfer to [facility] if bed available.   Taseen Marasigan,MD 8:54 AM

## 2016-05-25 NOTE — Discharge Instructions (Signed)
[facility],  It was a pleasure to meet you and take care of you while you were here. We are discharging you home with medications to help keep your comfortable and control your symptoms. Below is a guide for what medications to take for which symptoms.   For pain, shortness of breath, coughing spells, anxiety, restlessness or agitation: -Use the oral morphine solution 0.5 mLs every 2 hours  For restlessness, nausea, confusion: -Use Haloperidol 1 tablet every 4 hours as needed  For anxiety: -You can use either Lorazepam 0.5 mLs every 4 hours or Morphine 0.5 mLs every 2 hours  For itching: -Use Benadryl 25 mg every 6 hours or haloperidol 1 tablet every 4 hours  For secretions: -Use glycopyrrolate 1 tablet every 4 hours as needed  If you feel her symptoms are still uncontrolled, please talk to your Hospice Nurse. Transferring her to [facility] is always an options.

## 2016-05-28 LAB — CULTURE, BLOOD (ROUTINE X 2)
Culture: NO GROWTH
Culture: NO GROWTH

## 2016-06-11 DEATH — deceased

## 2016-08-10 ENCOUNTER — Ambulatory Visit: Admitting: Pulmonary Disease

## 2016-08-24 ENCOUNTER — Ambulatory Visit: Admitting: Cardiovascular Disease

## 2017-02-27 IMAGING — CT CT ABD-PELV W/O CM
2 of 4 series · 16 of 46 positions shown, 18 images · non-contrast
Comparison: CT 4 days prior 12/12/2015

CLINICAL DATA: Right mid abdominal pain for 3 days. Nausea and
vomiting.

EXAM:
CT ABDOMEN AND PELVIS WITHOUT CONTRAST
TECHNIQUE: Multidetector CT imaging of the abdomen and pelvis was performed
following the standard protocol without IV contrast.

[Series 2: rtn a/p w/o · axial · non-contrast · 0.67mm/px · z∈[+670,+1070]mm · 13 of 88 slices shown, 15 images]
[im 4/88  soft-tissue]
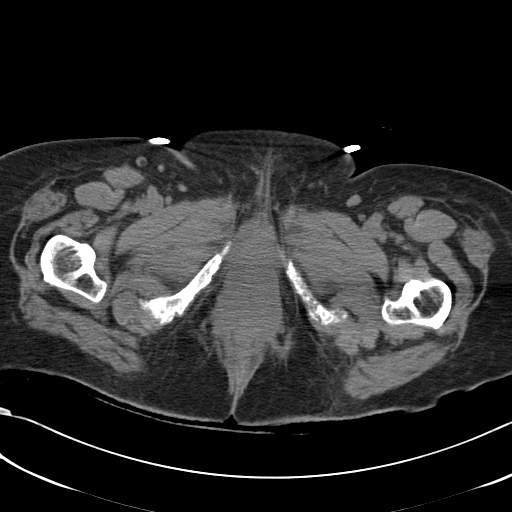
[im 4/88  bone]
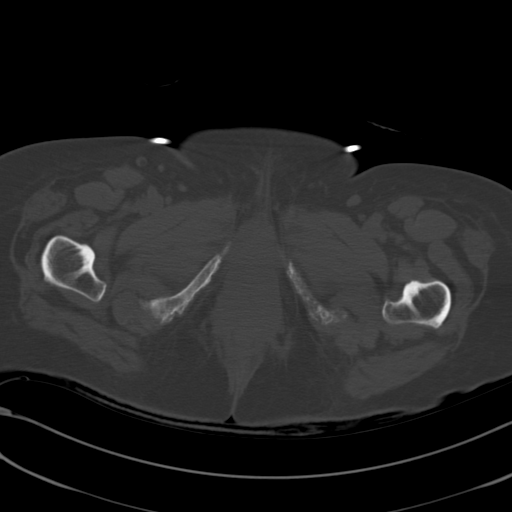
[im 12/88  soft-tissue]
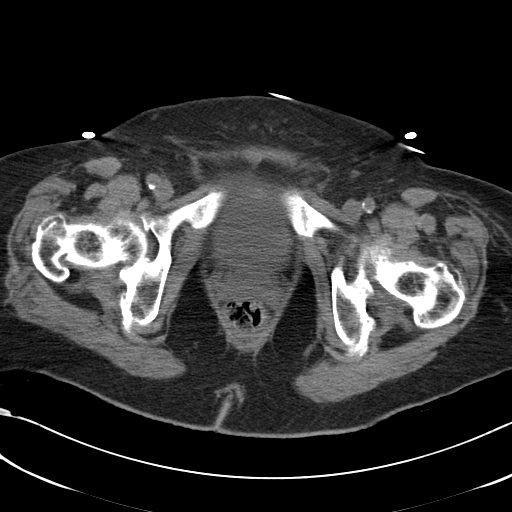
[im 19/88  soft-tissue]
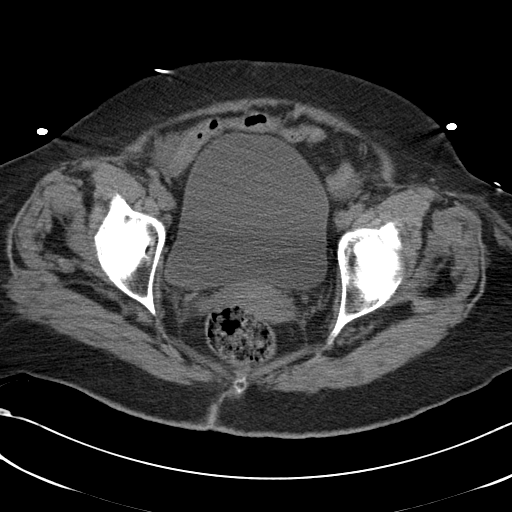
[im 23/88  soft-tissue]
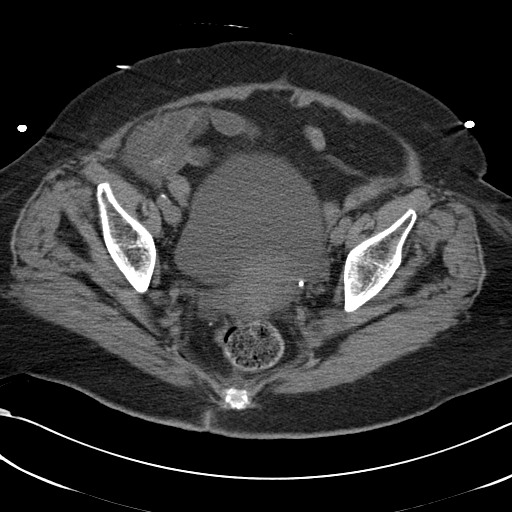
[im 31/88  soft-tissue]
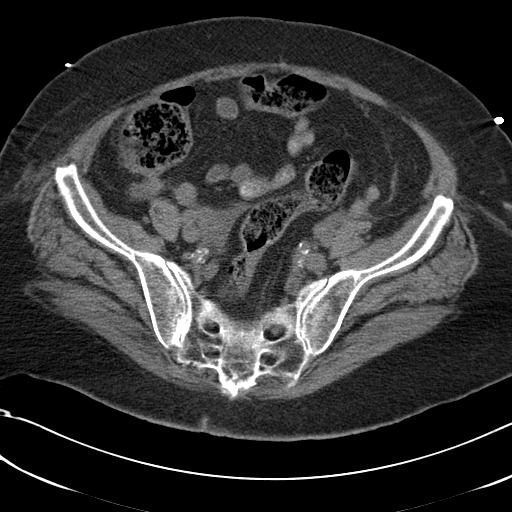
[im 38/88  soft-tissue]
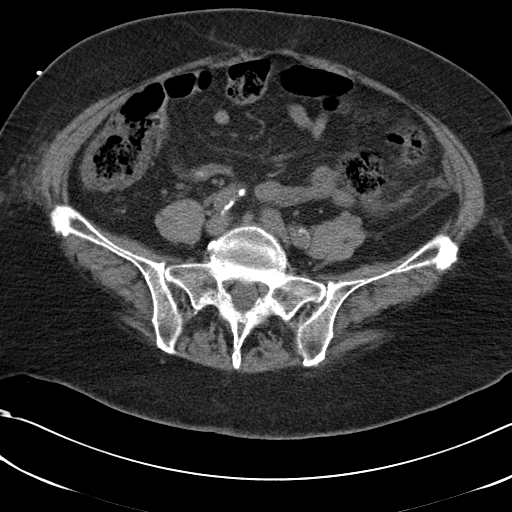
[im 46/88  soft-tissue]
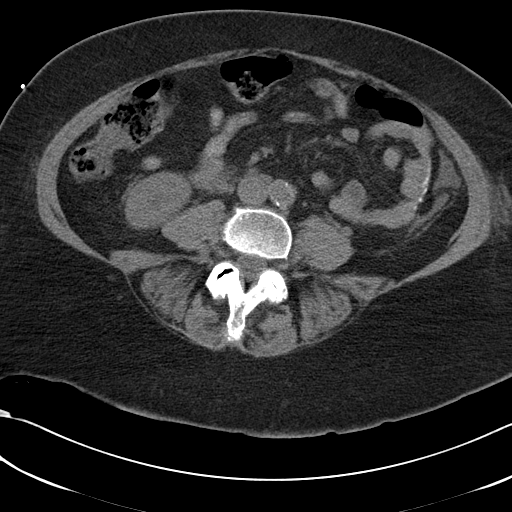
[im 50/88  soft-tissue]
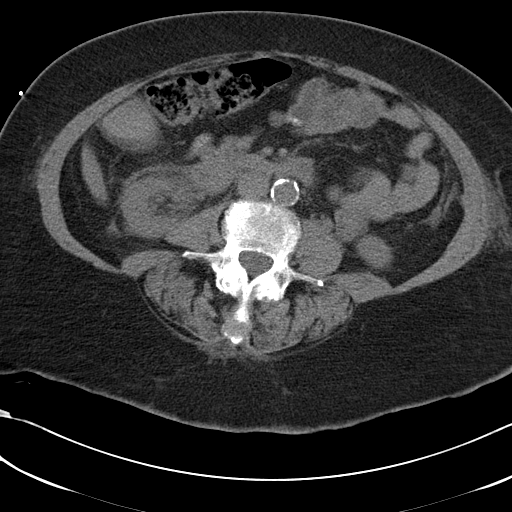
[im 57/88  soft-tissue]
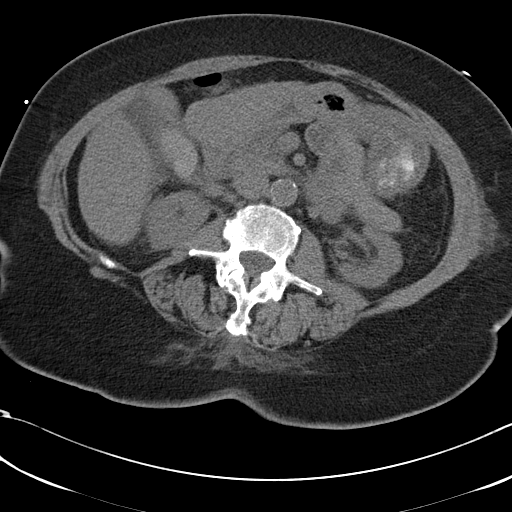
[im 57/88  bone]
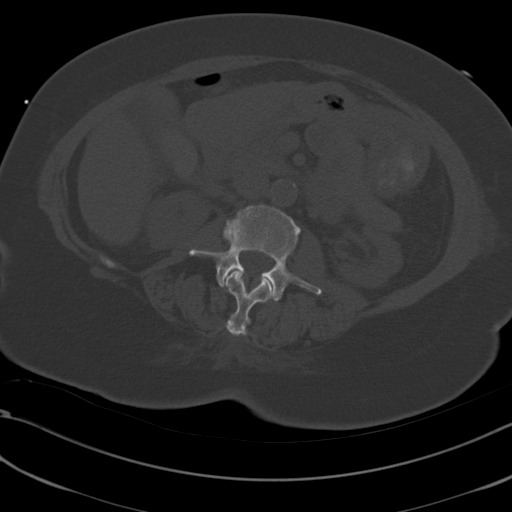
[im 65/88  soft-tissue]
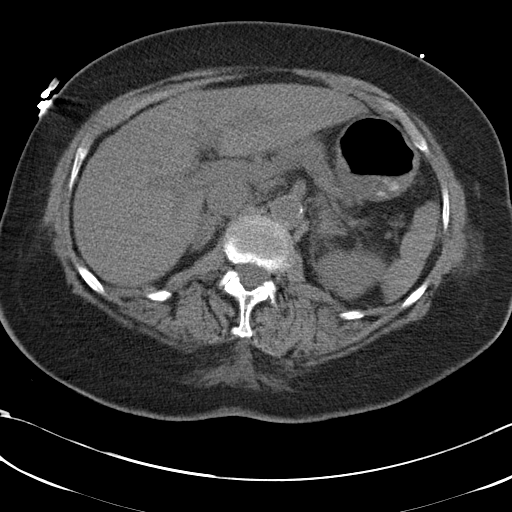
[im 69/88  soft-tissue]
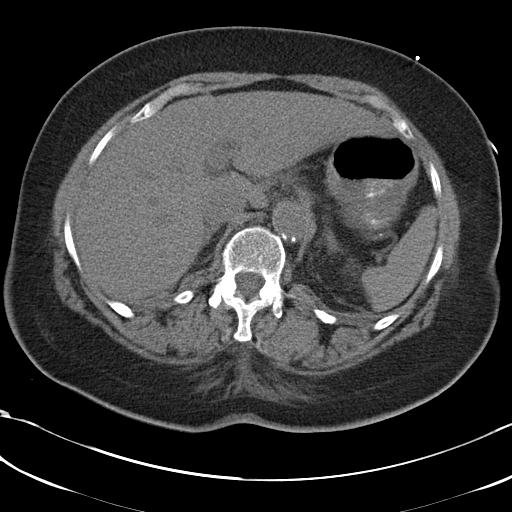
[im 76/88  soft-tissue]
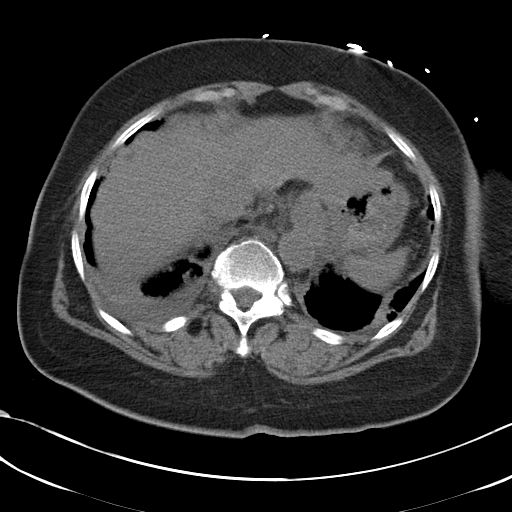
[im 84/88  soft-tissue]
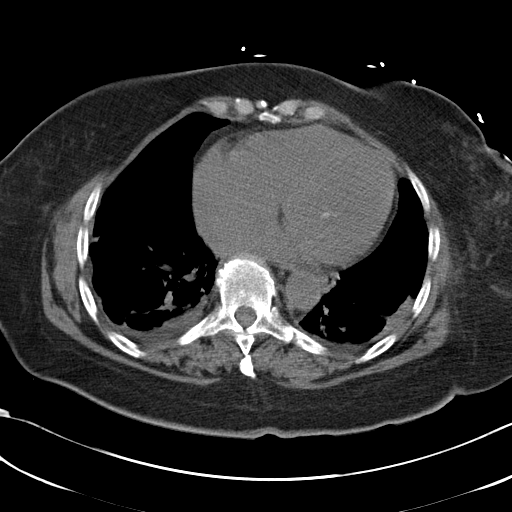

[Series 602: <mpr thick range> · coronal · 0.85mm/px · 3 of 131 slices shown]
[im 44/131  soft-tissue]
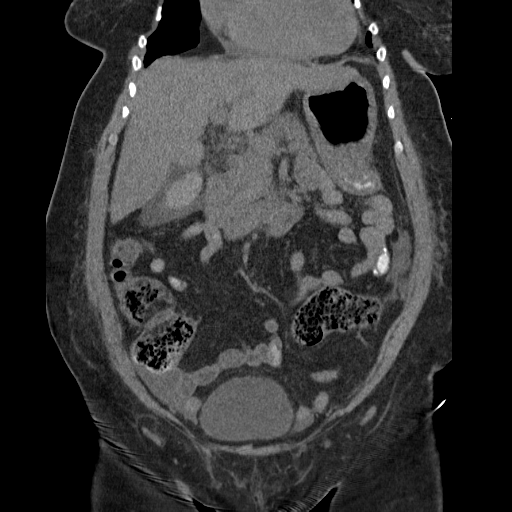
[im 58/131  soft-tissue]
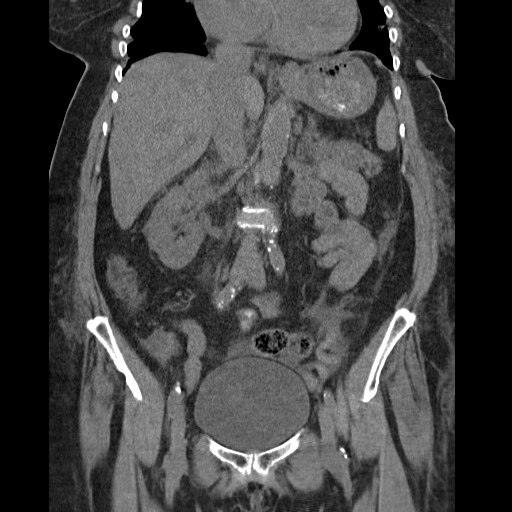
[im 73/131  soft-tissue]
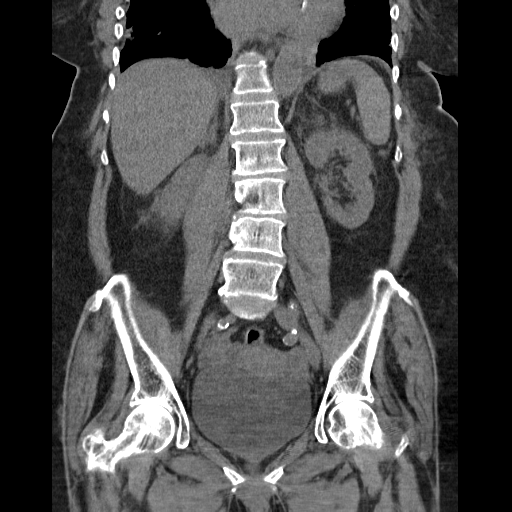

[16 of 46 positions shown; findings below may reference images not displayed]

FINDINGS: Lower chest: Stable cardiomegaly. Bibasilar bronchiectasis,
bibasilar opacities, and small pleural effusions, unchanged from
prior exam.

Liver: No focal lesion allowing for lack contrast.

Hepatobiliary: High-density material within the gallbladder, likely
combination of sludge and stones. Question of gallbladder wall
thickening and pericholecystic soft tissue stranding. No evidence of
biliary dilatation.

Pancreas: No ductal dilatation or inflammation. Suboptimal
pancreatic assessment given lack contrast.

Spleen: Normal.

Adrenal glands: Bilateral thickening, no nodule.

Kidneys: Bilateral perinephric soft tissue stranding, new from
prior. No hydronephrosis.

Stomach/Bowel: No abnormal gastric distention, small hiatal hernia.
Limited bowel assessment given lack contrast. There are no dilated
or thickened small bowel loops. Enteric chain sutures noted in the
left abdomen. No evidence of bowel obstruction. Moderate stool
burden. The appendix is not visualized.

Vascular/Lymphatic: No retroperitoneal adenopathy. Abdominal aorta
is normal in caliber. Moderate atherosclerosis of the abdominal
aorta without aneurysm.

Reproductive: Left ovarian/adnexal cyst measures 2.6 cm, unchanged
from recent prior. Uterus remains in situ. Right ovary is not well
seen.

Bladder: Physiologically distended.

Other: Development of small volume free fluid in both pericolic
gutters and in the pelvis. Mild mesenteric edema. No free air.

Musculoskeletal: There are no acute or suspicious osseous
abnormalities. Degenerative change in the lumbar spine.
IMPRESSION: 1. High-density material in the gallbladder, likely combination of
small stones and sludge. Questionable gallbladder wall thickening
and pericholecystic soft tissue stranding. Given right-sided
abdominal pain, acute cholecystitis is considered. Further
evaluation with right upper quadrant ultrasound recommended.
2. Development of small volume free fluid in the abdomen and pelvis.
Mesenteric and perinephric edema. Findings may be related to
hydration status. Correlation with urinalysis recommended to exclude
urinary tract infection.
3. Left adnexal cyst measures 2.6 cm, this is unchanged from recent
prior, however abnormal in a postmenopausal patient of this age.
Nonemergent sonographic follow-up is recommended.
4. Additional chronic findings are stable.

## 2017-04-07 IMAGING — DX DG CHEST 1V PORT
1 series · 1 of 1 positions shown · non-contrast
Comparison: CT 01/17/2016, radiograph 01/13/2016

CLINICAL DATA: Hypoxia, patient states she has been weak for past
48 hours

EXAM:
PORTABLE CHEST 1 VIEW

[chest ap]
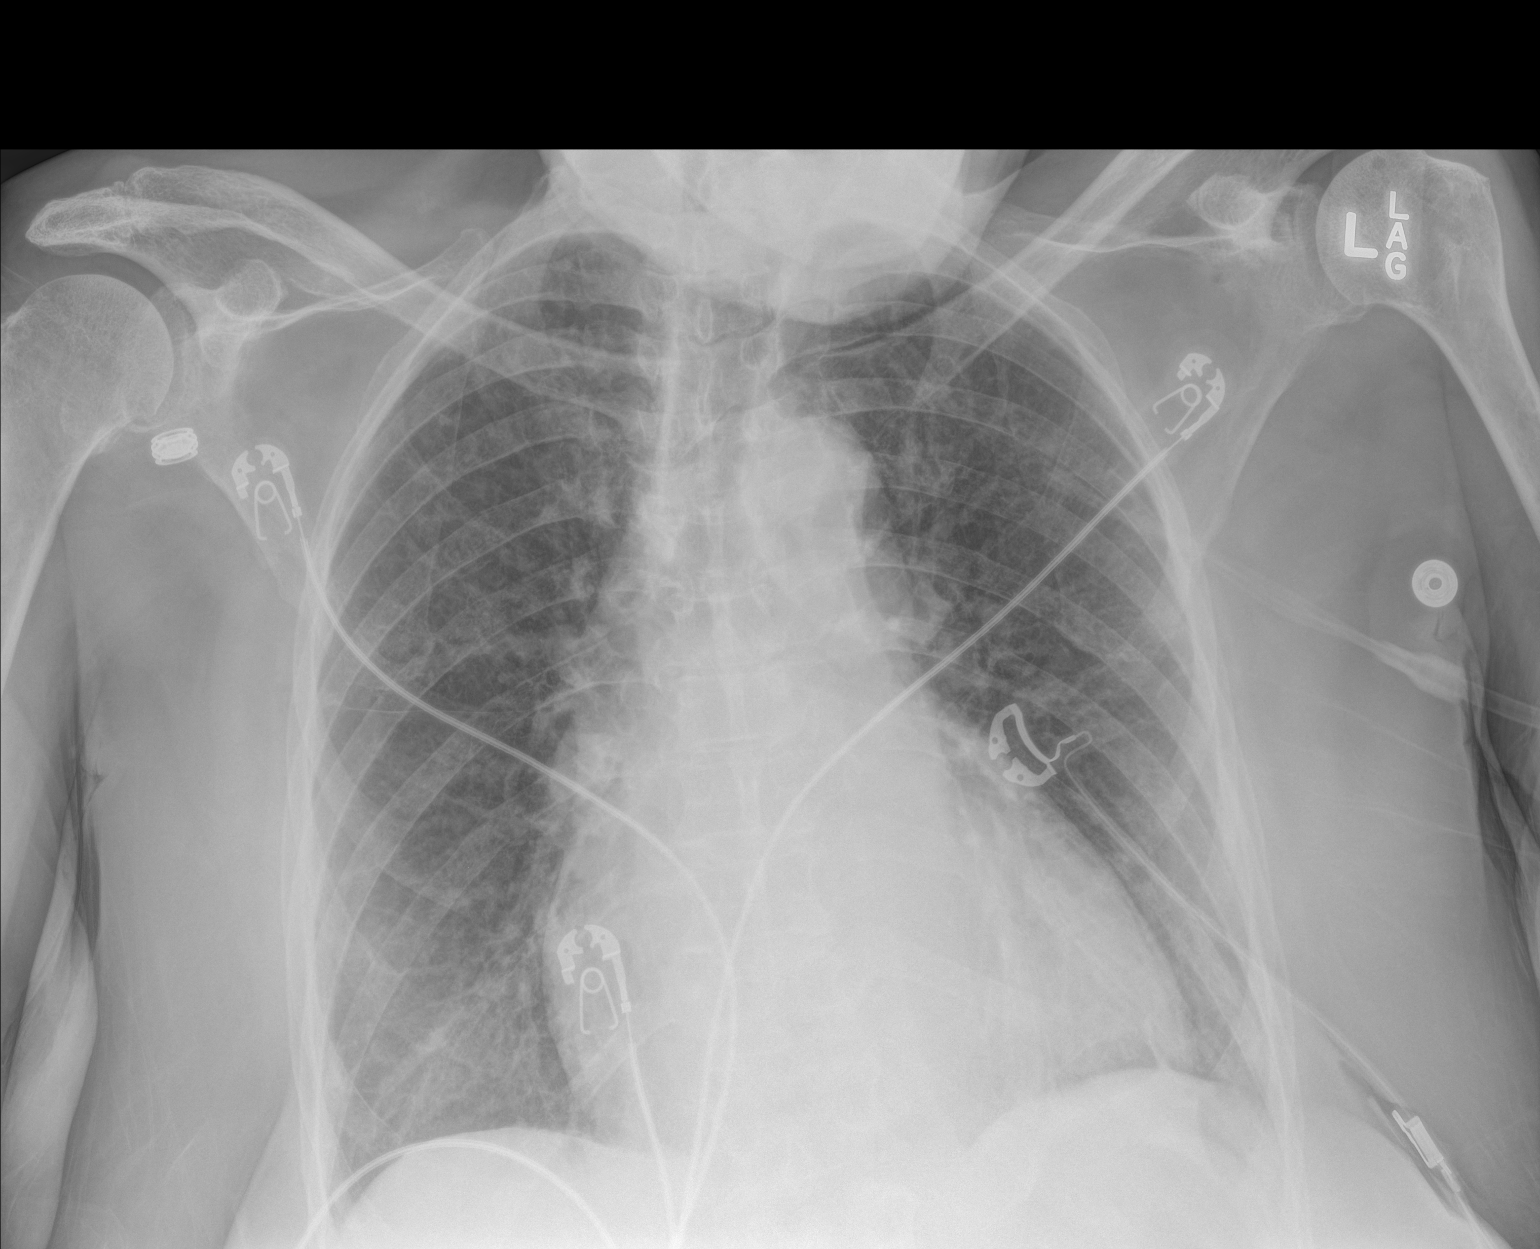

[1 of 1 positions shown; findings below may reference images not displayed]

FINDINGS: Stable enlarged cardiac silhouette. There is patchy nodular
densities in the lungs unchanged from prior. No pulmonary edema. No
focal consolidation or pneumothorax.
IMPRESSION: No change in patchy pulmonary parenchymal opacities compared CT of
01/17/2016
